# Patient Record
Sex: Female | Born: 1940
Health system: Southern US, Community
[De-identification: ages and names within clinical notes are randomized; demographics above are authoritative.]

## PROBLEM LIST (undated history)

## (undated) DIAGNOSIS — E2839 Other primary ovarian failure: Secondary | ICD-10-CM

## (undated) DIAGNOSIS — R51 Headache: Secondary | ICD-10-CM

## (undated) DIAGNOSIS — G4730 Sleep apnea, unspecified: Secondary | ICD-10-CM

## (undated) DIAGNOSIS — G709 Myoneural disorder, unspecified: Secondary | ICD-10-CM

## (undated) DIAGNOSIS — E785 Hyperlipidemia, unspecified: Secondary | ICD-10-CM

## (undated) DIAGNOSIS — H269 Unspecified cataract: Secondary | ICD-10-CM

## (undated) DIAGNOSIS — Z9289 Personal history of other medical treatment: Secondary | ICD-10-CM

## (undated) DIAGNOSIS — H93A9 Pulsatile tinnitus, unspecified ear: Secondary | ICD-10-CM

## (undated) DIAGNOSIS — T887XXA Unspecified adverse effect of drug or medicament, initial encounter: Secondary | ICD-10-CM

## (undated) DIAGNOSIS — E079 Disorder of thyroid, unspecified: Secondary | ICD-10-CM

## (undated) DIAGNOSIS — K219 Gastro-esophageal reflux disease without esophagitis: Secondary | ICD-10-CM

## (undated) DIAGNOSIS — N762 Acute vulvitis: Secondary | ICD-10-CM

## (undated) DIAGNOSIS — K2970 Gastritis, unspecified, without bleeding: Secondary | ICD-10-CM

## (undated) DIAGNOSIS — B009 Herpesviral infection, unspecified: Secondary | ICD-10-CM

## (undated) DIAGNOSIS — M47816 Spondylosis without myelopathy or radiculopathy, lumbar region: Secondary | ICD-10-CM

## (undated) DIAGNOSIS — F419 Anxiety disorder, unspecified: Secondary | ICD-10-CM

## (undated) DIAGNOSIS — K589 Irritable bowel syndrome without diarrhea: Secondary | ICD-10-CM

## (undated) DIAGNOSIS — B029 Zoster without complications: Secondary | ICD-10-CM

## (undated) DIAGNOSIS — I10 Essential (primary) hypertension: Secondary | ICD-10-CM

## (undated) DIAGNOSIS — A048 Other specified bacterial intestinal infections: Secondary | ICD-10-CM

## (undated) DIAGNOSIS — R739 Hyperglycemia, unspecified: Secondary | ICD-10-CM

## (undated) DIAGNOSIS — D126 Benign neoplasm of colon, unspecified: Secondary | ICD-10-CM

## (undated) HISTORY — DX: Myoneural disorder, unspecified: G70.9

## (undated) HISTORY — DX: Essential (primary) hypertension: I10

## (undated) HISTORY — DX: Other specified bacterial intestinal infections: A04.8

## (undated) HISTORY — PX: THYROIDECTOMY: SHX17

## (undated) HISTORY — DX: Headache: R51

## (undated) HISTORY — DX: Acute vulvitis: N76.2

## (undated) HISTORY — DX: Anxiety disorder, unspecified: F41.9

## (undated) HISTORY — DX: Other primary ovarian failure: E28.39

## (undated) HISTORY — PX: COLONOSCOPY: SHX174

## (undated) HISTORY — PX: POLYPECTOMY: SHX149

## (undated) HISTORY — PX: OTHER SURGICAL HISTORY: SHX169

## (undated) HISTORY — DX: Herpesviral infection, unspecified: B00.9

## (undated) HISTORY — DX: Hyperlipidemia, unspecified: E78.5

## (undated) HISTORY — DX: Personal history of other medical treatment: Z92.89

## (undated) HISTORY — PX: FINGER SURGERY: SHX640

## (undated) HISTORY — DX: Hyperglycemia, unspecified: R73.9

## (undated) HISTORY — PX: TONSILLECTOMY: SUR1361

## (undated) HISTORY — DX: Disorder of thyroid, unspecified: E07.9

## (undated) HISTORY — DX: Gastritis, unspecified, without bleeding: K29.70

## (undated) HISTORY — DX: Spondylosis without myelopathy or radiculopathy, lumbar region: M47.816

## (undated) HISTORY — DX: Irritable bowel syndrome, unspecified: K58.9

## (undated) HISTORY — DX: Unspecified adverse effect of drug or medicament, initial encounter: T88.7XXA

## (undated) HISTORY — DX: Benign neoplasm of colon, unspecified: D12.6

## (undated) HISTORY — DX: Zoster without complications: B02.9

## (undated) HISTORY — DX: Pulsatile tinnitus, unspecified ear: H93.A9

## (undated) HISTORY — DX: Sleep apnea, unspecified: G47.30

## (undated) HISTORY — PX: SHOULDER ARTHROSCOPY: SHX128

## (undated) HISTORY — DX: Unspecified cataract: H26.9

## (undated) HISTORY — DX: Gastro-esophageal reflux disease without esophagitis: K21.9

## (undated) NOTE — *Deleted (*Deleted)
Neurology Consultation Reason for Consult: *** Referring Physician: ***  CC: ***  History is obtained from:***  HPI: [name] [name] is a 28 y.o. female ***   LKW: *** tPA given?: No, or if yes, time given *** IA performed?: No, or if yes, groin puncture time: *** Premorbid modified rankin scale: ***     0 - No symptoms.     1 - No significant disability. Able to carry out all usual activities, despite some symptoms.     2 - Slight disability. Able to look after own affairs without assistance, but unable to carry out all previous activities.     3 - Moderate disability. Requires some help, but able to walk unassisted.     4 - Moderately severe disability. Unable to attend to own bodily needs without assistance, and unable to walk unassisted.     5 - Severe disability. Requires constant nursing care and attention, bedridden, incontinent.     6 - Dead. ICH Score: ***  Time performed: *** GCS: {Blank single:19197::"3-4 is 2 points","5-12 is 1 point","13-15 is 0 points"} Infratentorial: {yes no:314532}. If yes, 1 point Volume: {Blank single:19197::">30cc is 1 point","<30cc is 0 points"}  Age: 84 y.o.. >80 is 1 point Intraventricular extension is 1 point  Score:***  A Score of {Blank single:19197::"0 points has a 30 day mortality of 0%","1 points has a 30 day mortality of 13%","2 points has a 30 day mortality of 26%","3 points has a 30 day mortality of 72%","4 points has a 30 day mortality of 97%","5-6 points has a 30 day mortality of 100%"}. Stroke. 2001 Apr;32(4):891-7.    ROS: A 14 point ROS was performed and is negative except as noted in the HPI. *** Unable to obtain due to altered mental status.   Past Medical History:  Diagnosis Date  . Adenomatous colon polyp   . Anxiety   . Cataract 2013   bilat removed   . DJD (degenerative joint disease) of lumbar spine   . Estrogen deficiency   . Gastritis   . GERD (gastroesophageal reflux disease)   . Headache(784.0)   .  Helicobacter pylori infection 10/13/2007   Qualifier: Diagnosis of  By: Alesia Richards    . History of positive PPD 10/16/2011  . HSV-2 infection   . Hyperglycemia   . Hyperlipidemia    no meds now  . Hypertension   . IBS (irritable bowel syndrome)   . Medication side effect - Propofol (burning vein) and prvastatin (myalgia) 04/08/2013   Pravastatin patient went off and rechallenged and symptoms recurred muscle cramps /foot cramps   . Neuromuscular disorder (HCC)    muscle cramps  . Pulsatile tinnitus    had MRA and MPV nl mild carotid dopplers2010  . Shingles 02/09/2011   Right upper face and eyelid but not involving the eye at this point.   . Sleep apnea    does not use c pap  . Thyroid disease   . Vulvitis    Past Surgical History:  Procedure Laterality Date  . ABDOMINAL HYSTERECTOMY  1982   fibroids  . COLONOSCOPY  2003- 2102   adenomatous polyps  . FINGER SURGERY     left middle A1pulley release  . isthmuectomy     thyroid surgery  . LUMBAR SPINE SURGERY  05/1989   spine d/t tumor neurofibroma Roxan Hockey  . POLYPECTOMY    . SHOULDER ARTHROSCOPY Bilateral    left, right 2017  . THYROIDECTOMY     partial rt  ballen  . TONSILLECTOMY     at age 66.   Marland Kitchen TOTAL HIP ARTHROPLASTY Right 10/02/2017   Procedure: RIGHT TOTAL HIP ARTHROPLASTY ANTERIOR APPROACH;  Surgeon: Samson Frederic, MD;  Location: [facility];  Service: Orthopedics;  Laterality: Right;  Needs RNFA  . TUBAL LIGATION  1974   Current Outpatient Medications  Medication Instructions  . acetaminophen (TYLENOL) 1,000 mg, Oral, Every 6 hours PRN  . amLODipine (NORVASC) 10 mg, Oral, Daily  . famotidine (PEPCID) 20 mg, Oral, 2 times daily  . fluticasone (FLONASE) 50 MCG/ACT nasal spray 2 sprays, Each Nare, As needed, FOR RHINITIS  . hydrochlorothiazide (MICROZIDE) 12.5 mg, Oral, Daily  . melatonin 6 mg, Oral, Daily at bedtime  . QUEtiapine (SEROQUEL) 12.5 mg, Oral, Daily at bedtime, May take an additional 1/2 tablet 2  times daily as needed for agitation  . sennosides-docusate sodium (SENOKOT-S) 8.6-50 MG tablet 1 tablet, Oral, 2 times daily PRN  . SYNTHROID 100 MCG tablet TAKE 1 TABLET BY MOUTH EVERY DAY  . thiamine 100 mg, Oral, Daily  . triamterene-hydrochlorothiazide (MAXZIDE-25) 37.5-25 MG tablet TAKE 1 TABLET BY MOUTH EVERY DAY  . Vitamin B12 1,000 mg, Oral, Daily   Family History  Problem Relation Age of Onset  . Alzheimer's disease Mother   . Heart attack Father        2  . Asthma Sister   . Colon cancer Sister   . Heart attack Brother        65  . Colon polyps Sister   . Colon cancer Sister   . Colon cancer Paternal Aunt   . Thyroid disease Sister        multinodular goiter  . Stomach cancer Neg Hx   . Breast cancer Neg Hx   . Rectal cancer Neg Hx    ***  Social History:  reports that she quit smoking about 36 years ago. Her smoking use included cigarettes. She has a 11.50 pack-year smoking history. She has never used smokeless tobacco. She reports that she does not drink alcohol and does not use drugs. ***  Exam: Current vital signs: BP (!) 122/48 (BP Location: Left Arm)   Pulse 66   Temp 98.8 F (37.1 C) (Oral)   Resp 20   SpO2 98%  Vital signs in last 24 hours: Temp:  [98.2 F (36.8 C)-99.1 F (37.3 C)] 98.8 F (37.1 C) (11/05 1222) Pulse Rate:  [58-79] 66 (11/05 1222) Resp:  [12-23] 20 (11/05 1222) BP: (99-138)/(48-78) 122/48 (11/05 1222) SpO2:  [97 %-100 %] 98 % (11/05 1222)   Physical Exam  Constitutional: Appears well-developed and well-nourished.  Psych: Affect appropriate to situation Eyes: No scleral injection HENT: No OP obstrucion MSK: no joint deformities.  Cardiovascular: Normal rate and regular rhythm.  Respiratory: Effort normal, non-labored breathing GI: Soft.  No distension. There is no tenderness.  Skin: WDI  Neuro: Mental Status: Patient is awake, alert, oriented to person, place, month, year, and situation.*** Patient is able to give a  clear and coherent history.*** No signs of aphasia or neglect*** Cranial Nerves: II: Visual Fields are full. Pupils are equal, round, and reactive to light.  *** III,IV, VI: EOMI without ptosis or diploplia.  V: Facial sensation is symmetric to temperature VII: Facial movement is symmetric.  VIII: hearing is intact to voice X: Uvula elevates symmetrically XI: Shoulder shrug is symmetric. XII: tongue is midline without atrophy or fasciculations.  Motor: Tone is normal. Bulk is normal. 5/5 strength was present in all  four extremities. *** Sensory: Sensation is symmetric to light touch and temperature in the arms and legs.*** Deep Tendon Reflexes: 2+ and symmetric in the biceps and patellae. *** Plantars: Toes are downgoing bilaterally. *** Cerebellar: FNF and HKS are intact bilaterally***      I have reviewed labs in epic and the results pertinent to this consultation are: ***  I have reviewed the images obtained:***   Impression: ***  Recommendations: 1) ***   Brooke Dare MD-PhD [facility] [phone]

## (undated) NOTE — *Deleted (*Deleted)
Transition Care Management Follow-up Telephone Call Date of discharge and from where: *** How have you been since you were released from the hospital? *** Any questions or concerns? {YES/NO TOC TRACKING USE FOR MANAGED MEDICAID REPORTING:24185}  Items Reviewed: Did the pt receive and understand the discharge instructions provided? {YES/NO:21197} Medications obtained and verified? {YES/NO:21197} Other? {YES/NO:21197} Any new allergies since your discharge? {YES/NO:21197} Dietary orders reviewed? {YES/NO TITLE CASE:22902} Do you have support at home? {YES/NO:21197}  Home Care and Equipment/Supplies: Were home health services ordered? {Response; yes/no/na:63} If so, what is the name of the agency? ***  Has the agency set up a time to come to the patient's home? {Response; yes/no/na:63} Were any new equipment or medical supplies ordered?  {Yes/No:304960894} What is the name of the medical supply agency? *** Were you able to get the supplies/equipment? {Response; yes/no/na:63} Do you have any questions related to the use of the equipment or supplies? {Yes/No:304960894}  Functional Questionnaire: (I = Independent and D = Dependent) ADLs: ***  Bathing/Dressing- ***  Meal Prep- ***  Eating- ***  Maintaining continence- ***  Transferring/Ambulation- ***  Managing Meds- ***  Follow up appointments reviewed:  PCP Hospital f/u appt confirmed? {YES/NO:21197} Scheduled to see *** on *** @ ***. Specialist Hospital f/u appt confirmed? {YES/NO:21197} Scheduled to see *** on *** @ ***. Are transportation arrangements needed? {YES/NO:21197} If their condition worsens, is the pt aware to call PCP or go to the Emergency Dept.? {YES/NO TITLE CASE:22902} Was the patient provided with contact information for the PCP's office or ED? {YES/NO TITLE CASE:22902} Was to pt encouraged to call back with questions or concerns? {YES/NO TITLE CASE:22902}  

---

## 1972-08-12 HISTORY — PX: TUBAL LIGATION: SHX77

## 1980-08-12 HISTORY — PX: ABDOMINAL HYSTERECTOMY: SHX81

## 1989-05-12 HISTORY — PX: LUMBAR SPINE SURGERY: SHX701

## 1997-11-10 ENCOUNTER — Ambulatory Visit (HOSPITAL_COMMUNITY): Admission: RE | Admit: 1997-11-10 | Discharge: 1997-11-10 | Payer: Self-pay | Admitting: Interventional Cardiology

## 1998-01-13 ENCOUNTER — Other Ambulatory Visit: Admission: RE | Admit: 1998-01-13 | Discharge: 1998-01-13 | Payer: Self-pay | Admitting: Obstetrics and Gynecology

## 1998-02-02 ENCOUNTER — Ambulatory Visit (HOSPITAL_COMMUNITY): Admission: RE | Admit: 1998-02-02 | Discharge: 1998-02-02 | Payer: Self-pay | Admitting: *Deleted

## 1999-08-16 ENCOUNTER — Ambulatory Visit (HOSPITAL_COMMUNITY): Admission: RE | Admit: 1999-08-16 | Discharge: 1999-08-16 | Payer: Self-pay | Admitting: Internal Medicine

## 1999-09-27 ENCOUNTER — Other Ambulatory Visit: Admission: RE | Admit: 1999-09-27 | Discharge: 1999-09-27 | Payer: Self-pay | Admitting: Endocrinology

## 1999-11-08 ENCOUNTER — Ambulatory Visit (HOSPITAL_COMMUNITY): Admission: RE | Admit: 1999-11-08 | Discharge: 1999-11-09 | Payer: Self-pay | Admitting: General Surgery

## 1999-11-08 ENCOUNTER — Encounter (INDEPENDENT_AMBULATORY_CARE_PROVIDER_SITE_OTHER): Payer: Self-pay | Admitting: *Deleted

## 2000-01-31 ENCOUNTER — Encounter: Admission: RE | Admit: 2000-01-31 | Discharge: 2000-01-31 | Payer: Self-pay | Admitting: Obstetrics and Gynecology

## 2000-01-31 ENCOUNTER — Encounter: Payer: Self-pay | Admitting: Obstetrics and Gynecology

## 2001-02-04 ENCOUNTER — Encounter: Payer: Self-pay | Admitting: Neurological Surgery

## 2001-02-04 ENCOUNTER — Ambulatory Visit (HOSPITAL_COMMUNITY): Admission: RE | Admit: 2001-02-04 | Discharge: 2001-02-04 | Payer: Self-pay | Admitting: Neurological Surgery

## 2001-03-25 ENCOUNTER — Encounter: Admission: RE | Admit: 2001-03-25 | Discharge: 2001-03-25 | Payer: Self-pay | Admitting: Obstetrics and Gynecology

## 2001-03-25 ENCOUNTER — Encounter: Payer: Self-pay | Admitting: Obstetrics and Gynecology

## 2002-01-12 ENCOUNTER — Encounter: Admission: RE | Admit: 2002-01-12 | Discharge: 2002-01-12 | Payer: Self-pay | Admitting: Endocrinology

## 2002-01-12 ENCOUNTER — Encounter: Payer: Self-pay | Admitting: Endocrinology

## 2002-02-25 ENCOUNTER — Encounter: Payer: Self-pay | Admitting: Endocrinology

## 2002-02-25 ENCOUNTER — Encounter: Admission: RE | Admit: 2002-02-25 | Discharge: 2002-02-25 | Payer: Self-pay | Admitting: Endocrinology

## 2002-04-30 ENCOUNTER — Ambulatory Visit (HOSPITAL_COMMUNITY): Admission: RE | Admit: 2002-04-30 | Discharge: 2002-04-30 | Payer: Self-pay | Admitting: Otolaryngology

## 2002-05-27 ENCOUNTER — Encounter: Payer: Self-pay | Admitting: Obstetrics and Gynecology

## 2002-05-27 ENCOUNTER — Encounter: Admission: RE | Admit: 2002-05-27 | Discharge: 2002-05-27 | Payer: Self-pay | Admitting: Obstetrics and Gynecology

## 2002-06-21 ENCOUNTER — Inpatient Hospital Stay (HOSPITAL_COMMUNITY): Admission: EM | Admit: 2002-06-21 | Discharge: 2002-06-23 | Payer: Self-pay | Admitting: Internal Medicine

## 2002-06-21 ENCOUNTER — Encounter: Payer: Self-pay | Admitting: Internal Medicine

## 2002-12-16 ENCOUNTER — Encounter: Payer: Self-pay | Admitting: Neurosurgery

## 2002-12-16 ENCOUNTER — Ambulatory Visit (HOSPITAL_COMMUNITY): Admission: RE | Admit: 2002-12-16 | Discharge: 2002-12-16 | Payer: Self-pay | Admitting: Neurosurgery

## 2003-05-30 ENCOUNTER — Encounter: Admission: RE | Admit: 2003-05-30 | Discharge: 2003-05-30 | Payer: Self-pay | Admitting: Obstetrics and Gynecology

## 2003-05-30 ENCOUNTER — Encounter: Payer: Self-pay | Admitting: Obstetrics and Gynecology

## 2003-07-01 ENCOUNTER — Ambulatory Visit (HOSPITAL_COMMUNITY): Admission: RE | Admit: 2003-07-01 | Discharge: 2003-07-01 | Payer: Self-pay | Admitting: Gastroenterology

## 2004-08-08 ENCOUNTER — Encounter: Admission: RE | Admit: 2004-08-08 | Discharge: 2004-08-08 | Payer: Self-pay | Admitting: Obstetrics and Gynecology

## 2004-08-22 ENCOUNTER — Ambulatory Visit: Payer: Self-pay | Admitting: Gastroenterology

## 2004-08-23 ENCOUNTER — Ambulatory Visit: Payer: Self-pay | Admitting: Gastroenterology

## 2004-09-11 ENCOUNTER — Ambulatory Visit: Payer: Self-pay | Admitting: Endocrinology

## 2004-10-08 ENCOUNTER — Ambulatory Visit: Payer: Self-pay | Admitting: Gastroenterology

## 2004-10-12 ENCOUNTER — Ambulatory Visit: Payer: Self-pay | Admitting: Gastroenterology

## 2005-03-12 ENCOUNTER — Ambulatory Visit: Payer: Self-pay | Admitting: Internal Medicine

## 2005-04-30 ENCOUNTER — Ambulatory Visit: Payer: Self-pay | Admitting: Internal Medicine

## 2005-05-03 ENCOUNTER — Ambulatory Visit: Payer: Self-pay | Admitting: Cardiology

## 2005-07-16 ENCOUNTER — Ambulatory Visit: Payer: Self-pay | Admitting: Internal Medicine

## 2005-09-26 ENCOUNTER — Encounter: Admission: RE | Admit: 2005-09-26 | Discharge: 2005-09-26 | Payer: Self-pay | Admitting: Obstetrics and Gynecology

## 2005-11-04 ENCOUNTER — Ambulatory Visit: Payer: Self-pay | Admitting: Internal Medicine

## 2005-11-11 ENCOUNTER — Ambulatory Visit: Payer: Self-pay | Admitting: Internal Medicine

## 2005-12-11 ENCOUNTER — Ambulatory Visit: Payer: Self-pay | Admitting: Gastroenterology

## 2006-03-10 ENCOUNTER — Ambulatory Visit: Payer: Self-pay | Admitting: Internal Medicine

## 2006-08-29 ENCOUNTER — Ambulatory Visit: Payer: Self-pay | Admitting: Gastroenterology

## 2006-09-09 ENCOUNTER — Ambulatory Visit: Payer: Self-pay | Admitting: Cardiology

## 2006-10-20 ENCOUNTER — Encounter: Admission: RE | Admit: 2006-10-20 | Discharge: 2006-10-20 | Payer: Self-pay | Admitting: Obstetrics and Gynecology

## 2006-10-29 ENCOUNTER — Ambulatory Visit (HOSPITAL_COMMUNITY): Admission: RE | Admit: 2006-10-29 | Discharge: 2006-10-29 | Payer: Self-pay | Admitting: Neurosurgery

## 2006-12-26 ENCOUNTER — Ambulatory Visit: Payer: Self-pay | Admitting: Gastroenterology

## 2007-01-19 ENCOUNTER — Ambulatory Visit: Payer: Self-pay | Admitting: Internal Medicine

## 2007-01-19 LAB — CONVERTED CEMR LAB
ALT: 17 units/L (ref 0–40)
AST: 25 units/L (ref 0–37)
Albumin: 4.1 g/dL (ref 3.5–5.2)
Alkaline Phosphatase: 47 units/L (ref 39–117)
BUN: 18 mg/dL (ref 6–23)
Basophils Absolute: 0 10*3/uL (ref 0.0–0.1)
Basophils Relative: 0.1 % (ref 0.0–1.0)
Bilirubin, Direct: 0.1 mg/dL (ref 0.0–0.3)
CO2: 29 meq/L (ref 19–32)
Calcium: 9.4 mg/dL (ref 8.4–10.5)
Chloride: 108 meq/L (ref 96–112)
Cholesterol: 226 mg/dL (ref 0–200)
Creatinine, Ser: 0.8 mg/dL (ref 0.4–1.2)
Direct LDL: 135.3 mg/dL
Eosinophils Absolute: 0.1 10*3/uL (ref 0.0–0.6)
Eosinophils Relative: 2.3 % (ref 0.0–5.0)
GFR calc Af Amer: 93 mL/min
GFR calc non Af Amer: 77 mL/min
Glucose, Bld: 94 mg/dL (ref 70–99)
HCT: 39.1 % (ref 36.0–46.0)
HDL: 47.9 mg/dL (ref >39.0)
Hemoglobin: 13.2 g/dL (ref 12.0–15.0)
Hgb A1c MFr Bld: 6.3 % — ABNORMAL HIGH (ref 4.6–6.0)
Lymphocytes Relative: 41.6 % (ref 12.0–46.0)
MCHC: 33.6 g/dL (ref 30.0–36.0)
MCV: 87.5 fL (ref 78.0–100.0)
Monocytes Absolute: 0.4 10*3/uL (ref 0.2–0.7)
Monocytes Relative: 8.7 % (ref 3.0–11.0)
Neutro Abs: 2.4 10*3/uL (ref 1.4–7.7)
Neutrophils Relative %: 47.3 % (ref 43.0–77.0)
Platelets: 216 10*3/uL (ref 150–400)
Potassium: 3.8 meq/L (ref 3.5–5.1)
RBC: 4.47 M/uL (ref 3.87–5.11)
RDW: 13.1 % (ref 11.5–14.6)
Sodium: 144 meq/L (ref 135–145)
TSH: 0.72 microintl units/mL (ref 0.35–5.50)
Total Bilirubin: 0.7 mg/dL (ref 0.3–1.2)
Total CHOL/HDL Ratio: 4.7
Total Protein: 7.5 g/dL (ref 6.0–8.3)
Triglycerides: 186 mg/dL — ABNORMAL HIGH (ref 0–149)
VLDL: 37 mg/dL (ref 0–40)
WBC: 5 10*3/uL (ref 4.5–10.5)

## 2007-01-26 ENCOUNTER — Encounter: Admission: RE | Admit: 2007-01-26 | Discharge: 2007-01-26 | Payer: Self-pay | Admitting: Internal Medicine

## 2007-02-12 ENCOUNTER — Ambulatory Visit: Payer: Self-pay | Admitting: Family Medicine

## 2007-03-03 ENCOUNTER — Encounter: Admission: RE | Admit: 2007-03-03 | Discharge: 2007-03-03 | Payer: Self-pay | Admitting: Family Medicine

## 2007-03-03 ENCOUNTER — Other Ambulatory Visit: Admission: RE | Admit: 2007-03-03 | Discharge: 2007-03-03 | Payer: Self-pay | Admitting: Interventional Radiology

## 2007-03-03 ENCOUNTER — Encounter (INDEPENDENT_AMBULATORY_CARE_PROVIDER_SITE_OTHER): Payer: Self-pay | Admitting: Interventional Radiology

## 2007-03-25 ENCOUNTER — Ambulatory Visit: Payer: Self-pay | Admitting: Gastroenterology

## 2007-04-07 DIAGNOSIS — K219 Gastro-esophageal reflux disease without esophagitis: Secondary | ICD-10-CM | POA: Insufficient documentation

## 2007-04-07 DIAGNOSIS — I10 Essential (primary) hypertension: Secondary | ICD-10-CM | POA: Insufficient documentation

## 2007-04-07 DIAGNOSIS — E785 Hyperlipidemia, unspecified: Secondary | ICD-10-CM | POA: Insufficient documentation

## 2007-04-14 ENCOUNTER — Inpatient Hospital Stay (HOSPITAL_COMMUNITY): Admission: EM | Admit: 2007-04-14 | Discharge: 2007-04-15 | Payer: Self-pay | Admitting: Emergency Medicine

## 2007-04-15 ENCOUNTER — Encounter: Payer: Self-pay | Admitting: Internal Medicine

## 2007-04-16 ENCOUNTER — Ambulatory Visit: Payer: Self-pay | Admitting: Internal Medicine

## 2007-05-27 ENCOUNTER — Ambulatory Visit: Payer: Self-pay | Admitting: Internal Medicine

## 2007-07-02 ENCOUNTER — Ambulatory Visit: Payer: Self-pay | Admitting: Internal Medicine

## 2007-07-16 ENCOUNTER — Encounter: Payer: Self-pay | Admitting: Internal Medicine

## 2007-07-16 ENCOUNTER — Ambulatory Visit: Payer: Self-pay | Admitting: Internal Medicine

## 2007-08-24 ENCOUNTER — Ambulatory Visit: Payer: Self-pay | Admitting: Internal Medicine

## 2007-09-04 ENCOUNTER — Telehealth: Payer: Self-pay | Admitting: Internal Medicine

## 2007-09-04 DIAGNOSIS — R7309 Other abnormal glucose: Secondary | ICD-10-CM | POA: Insufficient documentation

## 2007-09-23 ENCOUNTER — Ambulatory Visit: Payer: Self-pay | Admitting: Internal Medicine

## 2007-09-23 DIAGNOSIS — R05 Cough: Secondary | ICD-10-CM | POA: Insufficient documentation

## 2007-09-23 DIAGNOSIS — J019 Acute sinusitis, unspecified: Secondary | ICD-10-CM | POA: Insufficient documentation

## 2007-09-23 DIAGNOSIS — R059 Cough, unspecified: Secondary | ICD-10-CM | POA: Insufficient documentation

## 2007-09-23 DIAGNOSIS — J309 Allergic rhinitis, unspecified: Secondary | ICD-10-CM | POA: Insufficient documentation

## 2007-10-13 DIAGNOSIS — E663 Overweight: Secondary | ICD-10-CM | POA: Insufficient documentation

## 2007-10-13 DIAGNOSIS — A048 Other specified bacterial intestinal infections: Secondary | ICD-10-CM | POA: Insufficient documentation

## 2007-10-13 DIAGNOSIS — D649 Anemia, unspecified: Secondary | ICD-10-CM | POA: Insufficient documentation

## 2007-10-13 DIAGNOSIS — K209 Esophagitis, unspecified without bleeding: Secondary | ICD-10-CM | POA: Insufficient documentation

## 2007-10-13 DIAGNOSIS — D126 Benign neoplasm of colon, unspecified: Secondary | ICD-10-CM | POA: Insufficient documentation

## 2007-10-13 DIAGNOSIS — L980 Pyogenic granuloma: Secondary | ICD-10-CM | POA: Insufficient documentation

## 2007-10-13 DIAGNOSIS — K2940 Chronic atrophic gastritis without bleeding: Secondary | ICD-10-CM | POA: Insufficient documentation

## 2007-10-13 DIAGNOSIS — K2980 Duodenitis without bleeding: Secondary | ICD-10-CM | POA: Insufficient documentation

## 2007-10-13 DIAGNOSIS — K449 Diaphragmatic hernia without obstruction or gangrene: Secondary | ICD-10-CM | POA: Insufficient documentation

## 2007-10-13 DIAGNOSIS — K589 Irritable bowel syndrome without diarrhea: Secondary | ICD-10-CM | POA: Insufficient documentation

## 2007-10-13 HISTORY — DX: Other specified bacterial intestinal infections: A04.8

## 2007-11-19 ENCOUNTER — Encounter: Admission: RE | Admit: 2007-11-19 | Discharge: 2007-11-19 | Payer: Self-pay | Admitting: Internal Medicine

## 2007-12-01 ENCOUNTER — Encounter: Admission: RE | Admit: 2007-12-01 | Discharge: 2007-12-01 | Payer: Self-pay | Admitting: Internal Medicine

## 2008-02-05 ENCOUNTER — Ambulatory Visit: Payer: Self-pay | Admitting: Internal Medicine

## 2008-02-05 DIAGNOSIS — E559 Vitamin D deficiency, unspecified: Secondary | ICD-10-CM | POA: Insufficient documentation

## 2008-02-05 LAB — CONVERTED CEMR LAB
ALT: 19 units/L (ref 0–35)
AST: 28 units/L (ref 0–37)
Albumin: 4.2 g/dL (ref 3.5–5.2)
Alkaline Phosphatase: 52 units/L (ref 39–117)
BUN: 18 mg/dL (ref 6–23)
Basophils Absolute: 0 10*3/uL (ref 0.0–0.1)
Basophils Relative: 0.8 % (ref 0.0–1.0)
Bilirubin, Direct: 0.1 mg/dL (ref 0.0–0.3)
CO2: 28 meq/L (ref 19–32)
Calcium: 9.7 mg/dL (ref 8.4–10.5)
Chloride: 101 meq/L (ref 96–112)
Cholesterol: 140 mg/dL (ref 0–200)
Creatinine, Ser: 0.8 mg/dL (ref 0.4–1.2)
Eosinophils Absolute: 0.1 10*3/uL (ref 0.0–0.7)
Eosinophils Relative: 2.6 % (ref 0.0–5.0)
GFR calc Af Amer: 92 mL/min
GFR calc non Af Amer: 76 mL/min
Glucose, Bld: 91 mg/dL (ref 70–99)
HCT: 41 % (ref 36.0–46.0)
HDL: 47.1 mg/dL (ref >39.0)
Hemoglobin: 13.5 g/dL (ref 12.0–15.0)
Hgb A1c MFr Bld: 6.3 % — ABNORMAL HIGH (ref 4.6–6.0)
LDL Cholesterol: 63 mg/dL (ref 0–99)
Lymphocytes Relative: 43.5 % (ref 12.0–46.0)
MCHC: 32.9 g/dL (ref 30.0–36.0)
MCV: 88.3 fL (ref 78.0–100.0)
Monocytes Absolute: 0.4 10*3/uL (ref 0.1–1.0)
Monocytes Relative: 8.3 % (ref 3.0–12.0)
Neutro Abs: 2.3 10*3/uL (ref 1.4–7.7)
Neutrophils Relative %: 44.8 % (ref 43.0–77.0)
Platelets: 201 10*3/uL (ref 150–400)
Potassium: 3.9 meq/L (ref 3.5–5.1)
RBC: 4.65 M/uL (ref 3.87–5.11)
RDW: 13.4 % (ref 11.5–14.6)
Sodium: 139 meq/L (ref 135–145)
TSH: 0.65 microintl units/mL (ref 0.35–5.50)
Total Bilirubin: 0.6 mg/dL (ref 0.3–1.2)
Total CHOL/HDL Ratio: 3
Total Protein: 7.6 g/dL (ref 6.0–8.3)
Triglycerides: 149 mg/dL (ref 0–149)
VLDL: 30 mg/dL (ref 0–40)
Vit D, 1,25-Dihydroxy: 39 (ref 30–89)
WBC: 5.2 10*3/uL (ref 4.5–10.5)

## 2008-02-07 DIAGNOSIS — R519 Headache, unspecified: Secondary | ICD-10-CM | POA: Insufficient documentation

## 2008-02-07 DIAGNOSIS — R51 Headache: Secondary | ICD-10-CM | POA: Insufficient documentation

## 2008-02-18 ENCOUNTER — Telehealth (INDEPENDENT_AMBULATORY_CARE_PROVIDER_SITE_OTHER): Payer: Self-pay | Admitting: *Deleted

## 2008-03-08 ENCOUNTER — Encounter: Payer: Self-pay | Admitting: Internal Medicine

## 2008-03-17 ENCOUNTER — Telehealth: Payer: Self-pay | Admitting: Internal Medicine

## 2008-03-30 ENCOUNTER — Encounter: Payer: Self-pay | Admitting: Internal Medicine

## 2008-05-10 ENCOUNTER — Telehealth: Payer: Self-pay | Admitting: Internal Medicine

## 2008-05-11 DIAGNOSIS — H9319 Tinnitus, unspecified ear: Secondary | ICD-10-CM | POA: Insufficient documentation

## 2008-05-17 ENCOUNTER — Ambulatory Visit: Payer: Self-pay | Admitting: Internal Medicine

## 2008-05-24 ENCOUNTER — Ambulatory Visit: Payer: Self-pay

## 2008-05-24 ENCOUNTER — Encounter: Payer: Self-pay | Admitting: Internal Medicine

## 2008-09-23 ENCOUNTER — Ambulatory Visit: Payer: Self-pay | Admitting: Internal Medicine

## 2008-09-23 DIAGNOSIS — R300 Dysuria: Secondary | ICD-10-CM | POA: Insufficient documentation

## 2008-09-23 LAB — CONVERTED CEMR LAB
Bilirubin Urine: NEGATIVE
Blood in Urine, dipstick: NEGATIVE
Glucose, Urine, Semiquant: NEGATIVE
Ketones, urine, test strip: NEGATIVE
Nitrite: POSITIVE
Specific Gravity, Urine: 1.015
Urobilinogen, UA: 1
pH: 7

## 2008-09-24 ENCOUNTER — Encounter: Payer: Self-pay | Admitting: Internal Medicine

## 2008-12-07 ENCOUNTER — Encounter: Payer: Self-pay | Admitting: Internal Medicine

## 2008-12-20 ENCOUNTER — Encounter: Admission: RE | Admit: 2008-12-20 | Discharge: 2008-12-20 | Payer: Self-pay | Admitting: Obstetrics and Gynecology

## 2009-02-28 ENCOUNTER — Ambulatory Visit: Payer: Self-pay | Admitting: Internal Medicine

## 2009-02-28 DIAGNOSIS — M79609 Pain in unspecified limb: Secondary | ICD-10-CM | POA: Insufficient documentation

## 2009-03-03 LAB — CONVERTED CEMR LAB
ALT: 18 units/L (ref 0–35)
AST: 30 units/L (ref 0–37)
Albumin: 3.8 g/dL (ref 3.5–5.2)
Alkaline Phosphatase: 39 units/L (ref 39–117)
BUN: 20 mg/dL (ref 6–23)
Basophils Absolute: 0 10*3/uL (ref 0.0–0.1)
Basophils Relative: 0.2 % (ref 0.0–3.0)
Bilirubin, Direct: 0 mg/dL (ref 0.0–0.3)
CO2: 30 meq/L (ref 19–32)
Calcium: 9.6 mg/dL (ref 8.4–10.5)
Chloride: 107 meq/L (ref 96–112)
Cholesterol: 134 mg/dL (ref 0–200)
Creatinine, Ser: 0.8 mg/dL (ref 0.4–1.2)
Direct LDL: 64.2 mg/dL
Eosinophils Absolute: 0.2 10*3/uL (ref 0.0–0.7)
Eosinophils Relative: 2.7 % (ref 0.0–5.0)
Folate: 14.3 ng/mL
GFR calc non Af Amer: 91.97 mL/min (ref >60)
Glucose, Bld: 88 mg/dL (ref 70–99)
HCT: 37.1 % (ref 36.0–46.0)
HDL: 45.7 mg/dL (ref >39.00)
Hemoglobin: 12.5 g/dL (ref 12.0–15.0)
Hgb A1c MFr Bld: 6 % (ref 4.6–6.5)
Lymphocytes Relative: 43.6 % (ref 12.0–46.0)
Lymphs Abs: 2.9 10*3/uL (ref 0.7–4.0)
MCHC: 33.7 g/dL (ref 30.0–36.0)
MCV: 88 fL (ref 78.0–100.0)
Monocytes Absolute: 0.6 10*3/uL (ref 0.1–1.0)
Monocytes Relative: 9.1 % (ref 3.0–12.0)
Neutro Abs: 3 10*3/uL (ref 1.4–7.7)
Neutrophils Relative %: 44.4 % (ref 43.0–77.0)
Platelets: 172 10*3/uL (ref 150.0–400.0)
Potassium: 3.7 meq/L (ref 3.5–5.1)
RBC: 4.22 M/uL (ref 3.87–5.11)
RDW: 13.1 % (ref 11.5–14.6)
Sodium: 144 meq/L (ref 135–145)
TSH: 0.55 microintl units/mL (ref 0.35–5.50)
Total Bilirubin: 0.6 mg/dL (ref 0.3–1.2)
Total CHOL/HDL Ratio: 3
Total Protein: 6.8 g/dL (ref 6.0–8.3)
Triglycerides: 252 mg/dL — ABNORMAL HIGH (ref 0.0–149.0)
VLDL: 50.4 mg/dL — ABNORMAL HIGH (ref 0.0–40.0)
Vitamin B-12: 899 pg/mL (ref 211–911)
WBC: 6.7 10*3/uL (ref 4.5–10.5)

## 2009-03-23 ENCOUNTER — Encounter: Admission: RE | Admit: 2009-03-23 | Discharge: 2009-03-23 | Payer: Self-pay | Admitting: Neurosurgery

## 2009-04-12 ENCOUNTER — Encounter: Payer: Self-pay | Admitting: Internal Medicine

## 2009-06-01 ENCOUNTER — Encounter: Payer: Self-pay | Admitting: Internal Medicine

## 2009-06-01 ENCOUNTER — Ambulatory Visit: Payer: Self-pay

## 2009-06-16 ENCOUNTER — Telehealth: Payer: Self-pay | Admitting: *Deleted

## 2009-07-18 ENCOUNTER — Telehealth: Payer: Self-pay | Admitting: *Deleted

## 2009-08-09 ENCOUNTER — Ambulatory Visit: Payer: Self-pay | Admitting: Internal Medicine

## 2009-08-09 LAB — CONVERTED CEMR LAB
ALT: 17 units/L (ref 0–35)
AST: 27 units/L (ref 0–37)
Albumin: 3.7 g/dL (ref 3.5–5.2)
Alkaline Phosphatase: 36 units/L — ABNORMAL LOW (ref 39–117)
Bilirubin, Direct: 0 mg/dL (ref 0.0–0.3)
Cholesterol: 211 mg/dL — ABNORMAL HIGH (ref 0–200)
Direct LDL: 131.1 mg/dL
HDL: 52.6 mg/dL (ref >39.00)
Hgb A1c MFr Bld: 6.1 % (ref 4.6–6.5)
Total Bilirubin: 0.7 mg/dL (ref 0.3–1.2)
Total CHOL/HDL Ratio: 4
Total Protein: 7.1 g/dL (ref 6.0–8.3)
Triglycerides: 159 mg/dL — ABNORMAL HIGH (ref 0.0–149.0)
VLDL: 31.8 mg/dL (ref 0.0–40.0)

## 2009-08-15 ENCOUNTER — Ambulatory Visit: Payer: Self-pay | Admitting: Internal Medicine

## 2009-09-15 ENCOUNTER — Telehealth: Payer: Self-pay | Admitting: Internal Medicine

## 2009-11-14 ENCOUNTER — Ambulatory Visit: Payer: Self-pay | Admitting: Internal Medicine

## 2009-11-14 LAB — CONVERTED CEMR LAB
Cholesterol: 161 mg/dL (ref 0–200)
HDL: 56 mg/dL (ref >39.00)
LDL Cholesterol: 88 mg/dL (ref 0–99)
Total CHOL/HDL Ratio: 3
Triglycerides: 86 mg/dL (ref 0.0–149.0)
VLDL: 17.2 mg/dL (ref 0.0–40.0)

## 2009-11-21 ENCOUNTER — Ambulatory Visit: Payer: Self-pay | Admitting: Internal Medicine

## 2009-11-21 DIAGNOSIS — G479 Sleep disorder, unspecified: Secondary | ICD-10-CM | POA: Insufficient documentation

## 2009-12-21 ENCOUNTER — Encounter: Admission: RE | Admit: 2009-12-21 | Discharge: 2009-12-21 | Payer: Self-pay | Admitting: Internal Medicine

## 2010-01-09 ENCOUNTER — Telehealth: Payer: Self-pay | Admitting: *Deleted

## 2010-01-11 ENCOUNTER — Telehealth: Payer: Self-pay | Admitting: *Deleted

## 2010-04-04 ENCOUNTER — Ambulatory Visit: Payer: Self-pay | Admitting: Internal Medicine

## 2010-04-04 DIAGNOSIS — M25569 Pain in unspecified knee: Secondary | ICD-10-CM | POA: Insufficient documentation

## 2010-04-06 LAB — CONVERTED CEMR LAB
ALT: 15 units/L (ref 0–35)
AST: 23 units/L (ref 0–37)
Albumin: 3.8 g/dL (ref 3.5–5.2)
Alkaline Phosphatase: 42 units/L (ref 39–117)
BUN: 20 mg/dL (ref 6–23)
Basophils Absolute: 0 10*3/uL (ref 0.0–0.1)
Basophils Relative: 0.4 % (ref 0.0–3.0)
Bilirubin, Direct: 0.1 mg/dL (ref 0.0–0.3)
CO2: 29 meq/L (ref 19–32)
Calcium: 9.3 mg/dL (ref 8.4–10.5)
Chloride: 108 meq/L (ref 96–112)
Cholesterol: 170 mg/dL (ref 0–200)
Creatinine, Ser: 0.8 mg/dL (ref 0.4–1.2)
Eosinophils Absolute: 0.1 10*3/uL (ref 0.0–0.7)
Eosinophils Relative: 2.4 % (ref 0.0–5.0)
GFR calc non Af Amer: 98.76 mL/min (ref >60)
Glucose, Bld: 87 mg/dL (ref 70–99)
HCT: 36.5 % (ref 36.0–46.0)
HDL: 48.7 mg/dL (ref >39.00)
Hemoglobin: 12.1 g/dL (ref 12.0–15.0)
Hgb A1c MFr Bld: 6.2 % (ref 4.6–6.5)
LDL Cholesterol: 101 mg/dL — ABNORMAL HIGH (ref 0–99)
Lymphocytes Relative: 44.7 % (ref 12.0–46.0)
Lymphs Abs: 2.4 10*3/uL (ref 0.7–4.0)
MCHC: 33.2 g/dL (ref 30.0–36.0)
MCV: 89.5 fL (ref 78.0–100.0)
Monocytes Absolute: 0.4 10*3/uL (ref 0.1–1.0)
Monocytes Relative: 7 % (ref 3.0–12.0)
Neutro Abs: 2.4 10*3/uL (ref 1.4–7.7)
Neutrophils Relative %: 45.5 % (ref 43.0–77.0)
Platelets: 164 10*3/uL (ref 150.0–400.0)
Potassium: 4.1 meq/L (ref 3.5–5.1)
RBC: 4.08 M/uL (ref 3.87–5.11)
RDW: 14.3 % (ref 11.5–14.6)
Sodium: 144 meq/L (ref 135–145)
TSH: 0.79 microintl units/mL (ref 0.35–5.50)
Total Bilirubin: 0.6 mg/dL (ref 0.3–1.2)
Total CHOL/HDL Ratio: 3
Total Protein: 6.5 g/dL (ref 6.0–8.3)
Triglycerides: 104 mg/dL (ref 0.0–149.0)
VLDL: 20.8 mg/dL (ref 0.0–40.0)
WBC: 5.3 10*3/uL (ref 4.5–10.5)

## 2010-06-07 ENCOUNTER — Encounter: Payer: Self-pay | Admitting: Internal Medicine

## 2010-06-08 ENCOUNTER — Encounter: Payer: Self-pay | Admitting: Internal Medicine

## 2010-06-08 ENCOUNTER — Ambulatory Visit: Payer: Self-pay

## 2010-06-08 ENCOUNTER — Telehealth: Payer: Self-pay | Admitting: *Deleted

## 2010-06-11 ENCOUNTER — Ambulatory Visit: Payer: Self-pay | Admitting: Internal Medicine

## 2010-07-24 ENCOUNTER — Ambulatory Visit: Payer: Self-pay | Admitting: Internal Medicine

## 2010-07-24 ENCOUNTER — Encounter: Payer: Self-pay | Admitting: Internal Medicine

## 2010-07-24 DIAGNOSIS — F4322 Adjustment disorder with anxiety: Secondary | ICD-10-CM | POA: Insufficient documentation

## 2010-07-24 DIAGNOSIS — R071 Chest pain on breathing: Secondary | ICD-10-CM | POA: Insufficient documentation

## 2010-07-30 LAB — CONVERTED CEMR LAB
BUN: 24 mg/dL — ABNORMAL HIGH
CO2: 28 meq/L
Calcium: 9.8 mg/dL
Chloride: 102 meq/L
Cholesterol: 192 mg/dL
Creatinine, Ser: 0.8 mg/dL
GFR calc non Af Amer: 98.67 mL/min
Glucose, Bld: 80 mg/dL
HDL: 58 mg/dL
Hgb A1c MFr Bld: 6.1 %
LDL Cholesterol: 108 mg/dL — ABNORMAL HIGH
Potassium: 3.8 meq/L
Sodium: 143 meq/L
TSH: 0.64 u[IU]/mL
Total CHOL/HDL Ratio: 3
Triglycerides: 129 mg/dL
VLDL: 25.8 mg/dL

## 2010-09-02 ENCOUNTER — Encounter: Payer: Self-pay | Admitting: Internal Medicine

## 2010-09-13 NOTE — Progress Notes (Signed)
Summary: refill on simvastatin  Phone Note From Pharmacy   [facility] [phone]* Reason for Call: Needs renewal Details for Reason: simvastatin 40mg  Initial call taken by: Romualdo Bolk, CMA Duncan Dull),  June 08, 2010 3:35 PM  Follow-up for Phone Call        Rx sent to pharmacy. Follow-up by: Romualdo Bolk, CMA Duncan Dull),  June 08, 2010 3:36 PM    Prescriptions: SIMVASTATIN 40 MG TABS (SIMVASTATIN) 1 by mouth once daily  #90 x 0   Entered by:   Romualdo Bolk, CMA (AAMA)   Authorized by:   Madelin Headings MD   Signed by:   Romualdo Bolk, CMA (AAMA) on 06/08/2010   Method used:   [facility]        [facility]. [phone]* (retail)       [address]       [place], [place]  [address]       Ph: [phone] or [phone]       Fax: [phone]   RxID:   [id number]

## 2010-09-13 NOTE — Assessment & Plan Note (Signed)
Summary: 3 month rov/njr   Vital Signs:  Patient profile:   70 year old female Menstrual status:  hysterectomy Weight:      204 pounds Pulse rate:   78 / minute BP sitting:   100 / 76  (right arm) Cuff size:   large  Vitals Entered By: Romualdo Bolk, CMA (AAMA) (November 21, 2009 10:29 AM) CC: Follow-up visit on labs, Hypertension Management, not sleeping at night   History of Present Illness: [name] [name] comesin for follow up of a number of medical issues  LPIDs : change the way she is eating for [place].  WJ:XBJYN well  controlled and nl.   also checked sugars and 110.   No taking gabapentin  for legs recently.  per Dr Venetia Maxon Dr Amanda Pea gave her iburpofen ocass for pain not taking   Sleep.   wakenings.   every few hours .  tried   tea andother  otx no help. seems to have a hard time shutting off thoughts but doing better in the day. Has ? about root such as valerian that dr Neil Crouch discussed    Hypertension History:      She complains of headache, but denies chest pain, palpitations, dyspnea with exertion, orthopnea, PND, peripheral edema, visual symptoms, neurologic problems, syncope, and side effects from treatment.  She notes no problems with any antihypertensive medication side effects.  ha's due to lack of sleep.        Positive major cardiovascular risk factors include female age 40 years old or older, hyperlipidemia, and hypertension.  Negative major cardiovascular risk factors include non-tobacco-user status.     Preventive Screening-Counseling & Management  Alcohol-Tobacco     Alcohol drinks/day: 0     Smoking Status: quit     Year Quit: 06/16/1984  Caffeine-Diet-Exercise     Caffeine use/day: 1-2     Does Patient Exercise: no  Current Medications (verified): 1)  Synthroid 100 Mcg  Tabs (Levothyroxine Sodium) .Marland Kitchen.. 1 By Mouth Once Daily 2)  Simvastatin 40 Mg Tabs (Simvastatin) .Marland Kitchen.. 1 By Mouth Once Daily 3)  Triamterene-Hctz 37.5-25 Mg Tabs (Triamterene-Hctz) .Marland Kitchen.. 1 By  Mouth Once Daily 4)  Flonase 50 Mcg/act Susp (Fluticasone Propionate) .... 2 Sprays Each Nares Q D For Rhinitis 5)  Ativan 0.5 Mg Tabs (Lorazepam) .Marland Kitchen.. 1-2 Two Times A Day 6)  Aspirin 81 Mg  Tabs (Aspirin) 7)  Multivitamins   Tabs (Multiple Vitamin) 8)  Vitamin D 1000 Unit  Tabs (Cholecalciferol) 9)  Gabapentin 100 Mg Caps (Gabapentin) .Marland Kitchen.. 1 By Mouth Once Daily As Needed  Allergies (verified): 1)  Codeine Phosphate (Codeine Phosphate) 2)  Oxycodone Hcl (Oxycodone Hcl) 3)  Zestril (Lisinopril)  Past History:  Past medical, surgical, family and social histories (including risk factors) reviewed, and no changes noted (except as noted below).  Past Medical History: Reviewed history from 02/28/2009 and no changes required. GERD Hyperlipidemia Hypertension Cancerous polyps, hx of Thyroid disease  evaluation for pulsatile tinnitus   MRA MPV normal  hyperglycemia  Abd pain eval with neg ct [place] and mri of back  djd Stern Headache ENT  eval for cough and throat     Consults Dr. Stasia Cavalier   Past Surgical History: Reviewed history from 02/05/2008 and no changes required. Hysterectomy fibroids Lumbar sx   Roxan Hockey   neuro fibroma Left shoulder arthroscopy Left middle finger A1 pulley release Thyroidectomy-partial right Ballen Isthmusectomy Tonsillectomy Colon polypectomy  Past History:  Care Management: Neurology: Venetia Maxon Orthopedics: [facility]  Family History: Reviewed history  from 02/05/2008 and no changes required. no change in fam hx  Family History of CAD Female 1st degree relative  father MI Family History Hypertension mom allergies  Social History: Reviewed history from 08/15/2009 and no changes required. Former Smoker Retired Alcohol use-no Drug use-no Regular exercise-no walks when she can husband dx with Multiple myeloma  . widowed this fall 2010 hh of 1  Review of Systems  The patient denies anorexia, fever, weight loss, weight gain, vision loss,  difficulty walking, abnormal bleeding, enlarged lymph nodes, and angioedema.    Physical Exam  General:  alert, well-developed, well-nourished, and well-hydrated.   Head:  normocephalic and atraumatic.   Eyes:  vision grossly intact.   Neck:  No deformities, masses, or tenderness noted. Lungs:  Normal respiratory effort, chest expands symmetrically. Lungs are clear to auscultation, no crackles or wheezes. Heart:  Normal rate and regular rhythm. S1 and S2 normal without gallop, murmur, click, rub or other extra sounds. Abdomen:  Bowel sounds positive,abdomen soft and non-tender without masses, organomegaly or  noted. Extremities:  no clubbing cyanosis or edema  Neurologic:  non focal  Skin:  turgor normal, color normal, no ecchymoses, and no petechiae.   Cervical Nodes:  No lymphadenopathy noted Psych:  Oriented X3, good eye contact, not anxious appearing, and not depressed appearing.     Impression & Recommendations:  Problem # 1:  HYPERLIPIDEMIA (ICD-272.4) Assessment Improved  Her updated medication list for this problem includes:    Simvastatin 40 Mg Tabs (Simvastatin) .Marland Kitchen... 1 by mouth once daily  Labs Reviewed: SGOT: 27 (08/09/2009)   SGPT: 17 (08/09/2009)  10 Yr Risk Heart Disease: 4 % Prior 10 Yr Risk Heart Disease: 7 % (08/15/2009)   HDL:56.00 (11/14/2009), 52.60 (08/09/2009)  LDL:88 (11/14/2009), 63 (02/05/2008)  Chol:161 (11/14/2009), 211 (08/09/2009)  Trig:86.0 (11/14/2009), 159.0 (08/09/2009)  Problem # 2:  SLEEP DISORDER/DISTURBANCE (ICD-780.50) Assessment: New interrupted   and  napping.    prob situational.    and related to  bereavement  . disc options  sleep aids vs antianxiety meds   . sleep hygiene to continue.   Problem # 3:  HYPERTENSION (ICD-401.9)  Her updated medication list for this problem includes:    Triamterene-hctz 37.5-25 Mg Tabs (Triamterene-hctz) .Marland Kitchen... 1 by mouth once daily  Problem # 4:  BEREAVEMENT UNCOMPLICATED NEC (ICD-V62.82)  Complete  Medication List: 1)  Synthroid 100 Mcg Tabs (Levothyroxine sodium) .Marland Kitchen.. 1 by mouth once daily 2)  Simvastatin 40 Mg Tabs (Simvastatin) .Marland Kitchen.. 1 by mouth once daily 3)  Triamterene-hctz 37.5-25 Mg Tabs (Triamterene-hctz) .Marland Kitchen.. 1 by mouth once daily 4)  Flonase 50 Mcg/act Susp (Fluticasone propionate) .... 2 sprays each nares q d for rhinitis 5)  Ativan 0.5 Mg Tabs (Lorazepam) .Marland Kitchen.. 1-2 two times a  day as needed 6)  Aspirin 81 Mg Tabs (Aspirin) 7)  Multivitamins Tabs (Multiple vitamin) 8)  Vitamin D 1000 Unit Tabs (Cholecalciferol) 9)  Gabapentin 100 Mg Caps (Gabapentin) .Marland Kitchen.. 1 by mouth once daily as needed  Hypertension Assessment/Plan:      The patient's hypertensive risk group is category B: At least one risk factor (excluding diabetes) with no target organ damage.  Her calculated 10 year risk of coronary heart disease is 4 %.  Today's blood pressure is 100/76.  Her blood pressure goal is < 140/90.  Patient Instructions: 1)  continue  medications your BP and lipids look good. 2)  contnue medications. 3)  Can try  antianxiety med at night if needed . 4)  return office visit in 4 months or as needed. Prescriptions: ATIVAN 0.5 MG TABS (LORAZEPAM) 1-2 two times a  day as needed  #40 x 0   Entered and Authorized by:   Madelin Headings MD   Signed by:   Madelin Headings MD on 11/21/2009   Method used:   Print then Give to Patient   RxID:   [id number]  greater than 50% of visit spent in counseling  25 minutes

## 2010-09-13 NOTE — Letter (Signed)
Summary: [facility]  [facility]   Imported By: Maryln Gottron 05/02/2009 12:08:53  _____________________________________________________________________  External Attachment:    Type:   Image     Comment:   External Document

## 2010-09-13 NOTE — Progress Notes (Signed)
Summary: REFILLS  Phone Note Refill Request Message from:  Fax from Pharmacy  Refills Requested: Medication #1:  SYNTHROID 100 MCG  TABS 1 by mouth once daily  Medication #2:  TRIAMTERENE-HCTZ 37.5-25 MG TABS 1 by mouth once daily [facility] [place] [facility]---[phone]     FAX---[phone]  Initial call taken by: Warnell Forester,  September 15, 2009 2:41 PM  Follow-up for Phone Call        Synthroid was approved ok x 3 on 08/16/2009 and I sent the other electronically.  Follow-up by: Romualdo Bolk, CMA (AAMA),  September 15, 2009 3:56 PM    Prescriptions: TRIAMTERENE-HCTZ 37.5-25 MG TABS (TRIAMTERENE-HCTZ) 1 by mouth once daily  #30 Tablet x 2   Entered by:   Romualdo Bolk, CMA (AAMA)   Authorized by:   Madelin Headings MD   Signed by:   Romualdo Bolk, CMA (AAMA) on 09/15/2009   Method used:   [facility] to        [facility] [phone]* (retail)       [address]       [place], [place]  [address]       Ph: [phone] or [phone]       Fax: [phone]   RxID:   [id number]

## 2010-09-13 NOTE — Assessment & Plan Note (Signed)
Summary: 4 month ov//ccm   Vital Signs:  Patient profile:   70 year old female Menstrual status:  hysterectomy Height:      65.75 inches Weight:      209 pounds BMI:     34.11 Pulse rate:   60 / minute BP sitting:   110 / 70  (left arm) Cuff size:   regular  Vitals Entered By: Romualdo Bolk, CMA (AAMA) (April 04, 2010 10:16 AM) CC: 4 month rov, Hypertension Management   History of Present Illness: [name] [name] comes in today  for follow up of multiple medical problems  Since last visit  Bp Seems to be ok without se of meds  KNees  seeing ortho   keeping her from exercising considering water exercise Sinus allergy right  eye   better now and  claritin d helped.   eye check was December.  Sleep: some better  .      ocass day nap.    less than 20 minutes if needed.   no ativan .  thinks she is doing ok with the grief  Gi no change    Hypertension History:      She complains of headache, but denies chest pain, palpitations, dyspnea with exertion, orthopnea, PND, peripheral edema, visual symptoms, neurologic problems, syncope, and side effects from treatment.  She notes no problems with any antihypertensive medication side effects.  Sinus ha's.        Positive major cardiovascular risk factors include female age 38 years old or older, hyperlipidemia, and hypertension.  Negative major cardiovascular risk factors include non-tobacco-user status.     Preventive Screening-Counseling & Management  Alcohol-Tobacco     Alcohol drinks/day: 0     Smoking Status: quit     Year Quit: 06/16/1984  Caffeine-Diet-Exercise     Caffeine use/day: 1-2     Does Patient Exercise: no  Current Medications (verified): 1)  Synthroid 100 Mcg  Tabs (Levothyroxine Sodium) .Marland Kitchen.. 1 By Mouth Once Daily 2)  Simvastatin 40 Mg Tabs (Simvastatin) .Marland Kitchen.. 1 By Mouth Once Daily 3)  Triamterene-Hctz 37.5-25 Mg Tabs (Triamterene-Hctz) .Marland Kitchen.. 1 By Mouth Once Daily 4)  Flonase 50 Mcg/act Susp (Fluticasone  Propionate) .... 2 Sprays Each Nares Q D For Rhinitis 5)  Aspirin 81 Mg  Tabs (Aspirin) 6)  Multivitamins   Tabs (Multiple Vitamin) 7)  Vitamin D 1000 Unit  Tabs (Cholecalciferol) 8)  Acetaminophen 650 Mg  Tbcr (Acetaminophen) 9)  Glucosamine-Chondroitin   Caps (Glucosamine-Chondroit-Vit C-Mn)  Allergies (verified): 1)  Codeine Phosphate (Codeine Phosphate) 2)  Oxycodone Hcl (Oxycodone Hcl) 3)  Zestril (Lisinopril)  Past History:  Past medical, surgical, family and social histories (including risk factors) reviewed, and no changes noted (except as noted below).  Past Medical History: GERD Hyperlipidemia Hypertension Cancerous polyps, hx of Thyroid disease  evaluation for pulsatile tinnitus   MRA MPV normal  hyperglycemia  Abd pain eval with neg ct [place] and mri of back  djd Stern Headache ENT  eval for cough and throat    Carotid dopplers mild disease 2010 Consults Dr. Stasia Cavalier   Past Surgical History: Reviewed history from 02/05/2008 and no changes required. Hysterectomy fibroids Lumbar sx   Roxan Hockey   neuro fibroma Left shoulder arthroscopy Left middle finger A1 pulley release Thyroidectomy-partial right Ballen Isthmusectomy Tonsillectomy Colon polypectomy  Past History:  Care Management: Neurology: Venetia Maxon Orthopedics: Gram[facility]  Family History: Reviewed history from 02/05/2008 and no changes required. no change in fam hx  Family History of CAD Female  1st degree relative  father MI Family History Hypertension mom allergies  Social History: Reviewed history from 08/15/2009 and no changes required. Former Smoker Retired Alcohol use-no Drug use-no Regular exercise-no walks when she can husband Multiple myeloma  . widowed  fall 2010 hh of 1  Review of Systems  The patient denies anorexia, fever, weight loss, weight gain, chest pain, syncope, dyspnea on exertion, prolonged cough, unusual weight change, abnormal bleeding, enlarged lymph nodes, and  angioedema.         right eye allergy    knee pain   tiched with  red koolade    right toe nail  spot for a while no trauma does use nail polish  no cp sob  . no edema   Physical Exam  General:  alert, well-developed, well-nourished, and well-hydrated.   Head:  normocephalic and atraumatic.   Eyes:  vision grossly intact, pupils equal, and pupils round.no redness    Nose:  no external deformity and no nasal discharge.   Neck:  No deformities, masses, or tenderness noted. Lungs:  Normal respiratory effort, chest expands symmetrically. Lungs are clear to auscultation, no crackles or wheezes. Heart:  Normal rate and regular rhythm. S1 and S2 normal without gallop, murmur, click, rub or other extra sounds.no lifts.   Abdomen:  Bowel sounds positive,abdomen soft and non-tender without masses, organomegaly or   noted. Msk:  no swelling mild crepitus in knees Pulses:  pulses intact without delay   Extremities:  no clubbing cyanosis or edema  Neurologic:  alert & oriented X3, strength normal in all extremities, and gait normal.   Skin:  no ecchymoses and no petechiae.   Cervical Nodes:  No lymphadenopathy noted Psych:  Oriented X3, good eye contact, not anxious appearing, and not depressed appearing.     Impression & Recommendations:  Problem # 1:  HYPERTENSION (ICD-401.9)  Her updated medication list for this problem includes:    Triamterene-hctz 37.5-25 Mg Tabs (Triamterene-hctz) .Marland Kitchen... 1 by mouth once daily  BP today: 110/70 Prior BP: 100/76 (11/21/2009)  Prior 10 Yr Risk Heart Disease: 4 % (11/21/2009)  Labs Reviewed: K+: 3.7 (02/28/2009) Creat: : 0.8 (02/28/2009)   Chol: 161 (11/14/2009)   HDL: 56.00 (11/14/2009)   LDL: 88 (11/14/2009)   TG: 86.0 (11/14/2009)  Problem # 2:  BEREAVEMENT UNCOMPLICATED NEC (ICD-V62.82) progressing  anniversery fall 2011  .c  Problem # 3:  SLEEP DISORDER/DISTURBANCE (ICD-780.50) declined routine med but can  try as needed lunesta   2 mg .    samples given  Discussed risk benefit    Problem # 4:  KNEE PAIN (ICD-719.46)  under ortho care   disc water exercises   Her updated medication list for this problem includes:    Aspirin 81 Mg Tabs (Aspirin)    Acetaminophen 650 Mg Tbcr (Acetaminophen)  Orders: TLB-CBC Platelet - w/Differential (85025-CBCD) Venipuncture (98119) Specimen Handling (14782)  Problem # 5:  HYPOTHYROIDISM (ICD-244.9)  Her updated medication list for this problem includes:    Synthroid 100 Mcg Tabs (Levothyroxine sodium) .Marland Kitchen... 1 by mouth once daily  Orders: TLB-TSH (Thyroid Stimulating Hormone) (84443-TSH) TLB-BMP (Basic Metabolic Panel-BMET) (80048-METABOL) Venipuncture (95621) Specimen Handling (30865)  Labs Reviewed: TSH: 0.55 (02/28/2009)    HgBA1c: 6.1 (08/09/2009) Chol: 161 (11/14/2009)   HDL: 56.00 (11/14/2009)   LDL: 88 (11/14/2009)   TG: 86.0 (11/14/2009)  Problem # 6:  FASTING HYPERGLYCEMIA (ICD-790.29)  prediabetes type will recheck  Orders: TLB-A1C / Hgb A1C (Glycohemoglobin) (83036-A1C) Venipuncture (78469) Specimen Handling (62952)  Labs Reviewed: Creat: 0.8 (02/28/2009)     Complete Medication List: 1)  Synthroid 100 Mcg Tabs (Levothyroxine sodium) .Marland Kitchen.. 1 by mouth once daily 2)  Simvastatin 40 Mg Tabs (Simvastatin) .Marland Kitchen.. 1 by mouth once daily 3)  Triamterene-hctz 37.5-25 Mg Tabs (Triamterene-hctz) .Marland Kitchen.. 1 by mouth once daily 4)  Flonase 50 Mcg/act Susp (Fluticasone propionate) .... 2 sprays each nares q d for rhinitis 5)  Aspirin 81 Mg Tabs (Aspirin) 6)  Multivitamins Tabs (Multiple vitamin) 7)  Vitamin D 1000 Unit Tabs (Cholecalciferol) 8)  Acetaminophen 650 Mg Tbcr (Acetaminophen) 9)  Glucosamine-chondroitin Caps (Glucosamine-chondroit-vit c-mn) 10)  Lunesta 2 Mg Tabs (Eszopiclone) .Marland Kitchen.. 1 by mouth at night as needed sleep  Other Orders: TLB-Hepatic/Liver Function Pnl (80076-HEPATIC) TLB-Lipid Panel (80061-LIPID)  Hypertension Assessment/Plan:      The patient's  hypertensive risk group is category B: At least one risk factor (excluding diabetes) with no target organ damage.  Her calculated 10 year risk of coronary heart disease is 4 %.  Today's blood pressure is 110/70.  Her blood pressure goal is < 140/90.  Patient Instructions: 1)  agree with water aerobics also . 2)  can try as needed lunesta sleep aid. 3)  You will be informed of lab results when available.  4)  annual wellness visit in december

## 2010-09-13 NOTE — Progress Notes (Signed)
Summary: cold  Phone Note Call from Patient Call back at Home Phone [phone]   Caller: Patient Summary of Call: Pt states that she has a head cold, sore throat and ha since 03/14/08. She has tried tylenol cold and cough syrup. Spoke to pt- no fever just coughing, congestion and sneezing alot. Told pt to try mucinex otc and if gets sinus pressure use saline nasal spray. But if she gets sob, fever or feels worse, she needs to make an appt. Initial call taken by: Romualdo Bolk, CMA,  March 17, 2008 10:32 AM

## 2010-09-13 NOTE — Progress Notes (Signed)
Summary: Call from Dr. Annalee Genta  Phone Note From Other Clinic   Caller: Dr. Annalee Genta Summary of Call: Dr. Annalee Genta is sending you a note on pt. She is having ringing in her ears and he thinks that she needs a carotid US. If you would like to talk to him, please feel free to call him at the office. You should be getting the note next week. Initial call taken by: Romualdo Bolk, CMA,  May 10, 2008 9:17 AM  Follow-up for Phone Call        do we need to schedule this ? okif so Follow-up by: Madelin Headings MD,  May 10, 2008 4:42 PM  Additional Follow-up for Phone Call Additional follow up Details #1::        Left message to call back. Additional Follow-up by: Romualdo Bolk, CMA,  May 11, 2008 8:47 AM  New Problems: TINNITUS (ICD-388.30)   Additional Follow-up for Phone Call Additional follow up Details #2::    Spoke with pt- okay to set up. Order sent to [facility]. Romualdo Bolk, CMA  May 11, 2008 9:24 AM   New Problems: TINNITUS (ICD-388.30)

## 2010-09-13 NOTE — Procedures (Signed)
Summary: Gastroenterology  Gastroenterology   Imported By: Donneta Romberg 10/19/2007 09:21:57  _____________________________________________________________________  External Attachment:    Type:   Image     Comment:   External Document

## 2010-09-13 NOTE — Assessment & Plan Note (Signed)
Summary: flu shot//ccm   Nurse Visit   Allergies: 1)  Codeine Phosphate (Codeine Phosphate) 2)  Oxycodone Hcl (Oxycodone Hcl) 3)  Zestril (Lisinopril)  Orders Added: 1)  Admin 1st Vaccine [90471] 2)  Flu Vaccine 66yrs + [16109]    Flu Vaccine Consent Questions     Do you have a history of severe allergic reactions to this vaccine? no    Any prior history of allergic reactions to egg and/or gelatin? no    Do you have a sensitivity to the preservative Thimersol? no    Do you have a past history of Guillan-Barre Syndrome? no    Do you currently have an acute febrile illness? no    Have you ever had a severe reaction to latex? no    Vaccine information given and explained to patient? yes    Are you currently pregnant? no    Lot Number:[id number]   Exp Date:02/09/2011   Site Given  Left Deltoid IM Romualdo Bolk, CMA (AAMA)  June 11, 2010 10:09 AM

## 2010-09-13 NOTE — Assessment & Plan Note (Signed)
Summary: FLU SHOT/NJR   Nurse Visit    Prior Medications: SYNTHROID 100 MCG  TABS (LEVOTHYROXINE SODIUM) 1 by mouth once daily CRESTOR 40 MG TABS (ROSUVASTATIN CALCIUM)  TRIAMTERENE-HCTZ 37.5-25 MG TABS (TRIAMTERENE-HCTZ) 1 by mouth once daily NEXIUM 40 MG  CPDR (ESOMEPRAZOLE MAGNESIUM)  AMOXICILLIN 875 MG  TABS (AMOXICILLIN) 1 by mouth three times a day NASACORT AQ 55 MCG/ACT  AERS (TRIAMCINOLONE ACETONIDE(NASAL))  Current Allergies: CODEINE PHOSPHATE (CODEINE PHOSPHATE) OXYCODONE HCL (OXYCODONE HCL) ZESTRIL (LISINOPRIL)  Flu Vaccine Consent Questions     Do you have a history of severe allergic reactions to this vaccine? no    Any prior history of allergic reactions to egg and/or gelatin? no    Do you have a sensitivity to the preservative Thimersol? no    Do you have a past history of Guillan-Barre Syndrome? no    Do you currently have an acute febrile illness? no    Have you ever had a severe reaction to latex? no    Vaccine information given and explained to patient? yes    Are you currently pregnant? no    Lot Number:[id number]   Site Given  Left Deltoid IM   Orders Added: 1)  Flu Vaccine 64yrs + [16109] 2)  Administration Flu vaccine [G0008]    ]

## 2010-09-13 NOTE — Assessment & Plan Note (Signed)
Summary: emp---will labs//ccm   Vital Signs:  Patient profile:   70 year old female Menstrual status:  hysterectomy Height:      66 inches Weight:      209 pounds BMI:     33.86 Pulse rate:   78 / minute BP sitting:   120 / 80  (left arm) Cuff size:   large  Vitals Entered By: Romualdo Bolk, CMA (AAMA) (July 24, 2010 10:04 AM)  Nutrition Counseling: Patient's BMI is greater than 25 and therefore counseled on weight management options. CC: Annual Visit for Disease Management   History of Present Illness: [name] [name] comes in today   for annual wellness visit .  And medical management  Here for Medicare AWV:  1.   Risk factors based on Past M, S, F history:   2.   Physical Activities:   no reg exercise  but going to do water aerobic.  to go to [employer].   3.   Depression/mood:  4.   Hearing:  good  5.   ADL's: Independent.  drives car  6.   Fall Risk:  none   problem with knees at times  7.   Home Safety:  8.   Height, weight, &visual acuity:    due for eye check  has eye glasses  9.   Counseling:  10.   Labs ordered based on risk factors:  11.           Referral Coordination 12.           Care Plan  disc  End of life    to get son to be POA  discussed with family.  13.            Cognitive Assessment  Pt is A&Ox3,affect,speech,memory,attention,&motor skills appear intact.   had 12 week  aging [employer] testing   did very  well.  on reasoning.  and memorization Thyroid: no change in medds   sometimes anxious and on edge  no palpitations  Hyperglycemia: LIPIDS:  no se ofmeds  HT:  controlled Sleep.   still problem  even with lunesta  thinks anxiety and mind racing   Preventive Care Screening  Colonoscopy:    Date:  07/16/2007    Results:  normal   Last Tetanus Booster:    Date:  08/12/2004    Results:  Historical   Prior Values:    Mammogram:  ASSESSMENT: Negative - BI-RADS 1^MM DIGITAL SCREENING (12/21/2009)    Last Flu Shot:  Fluvax 3+  (06/11/2010)    Last Pneumovax:  Pneumovax (02/05/2008)   Preventive Screening-Counseling & Management  Alcohol-Tobacco     Alcohol drinks/day: 0     Smoking Status: quit     Year Quit: 06/16/1984  Caffeine-Diet-Exercise     Caffeine use/day: 1-2     Does Patient Exercise: no  Hep-HIV-STD-Contraception     Dental Visit-last 6 months no     Dental Care Counseling: to seek dental care; no dental care within six months  Safety-Violence-Falls     Seat Belt Use: yes     Firearms in the Home: no firearms in the home     Smoke Detectors: yes     Fall Risk: none       Blood Transfusions:  no.    Current Medications (verified): 1)  Synthroid 100 Mcg  Tabs (Levothyroxine Sodium) .Marland Kitchen.. 1 By Mouth Once Daily 2)  Simvastatin 40 Mg Tabs (Simvastatin) .Marland Kitchen.. 1 By Mouth Once Daily 3)  Triamterene-Hctz 37.5-25 Mg Tabs (Triamterene-Hctz) .Marland Kitchen.. 1 By Mouth Once Daily 4)  Flonase 50 Mcg/act Susp (Fluticasone Propionate) .... 2 Sprays Each Nares Q D For Rhinitis 5)  Aspirin 81 Mg  Tabs (Aspirin) 6)  Multivitamins   Tabs (Multiple Vitamin) 7)  Vitamin D 1000 Unit  Tabs (Cholecalciferol) 8)  Acetaminophen 650 Mg  Tbcr (Acetaminophen) 9)  Glucosamine-Chondroitin   Caps (Glucosamine-Chondroit-Vit C-Mn) 10)  Lunesta 2 Mg Tabs (Eszopiclone) .Marland Kitchen.. 1 By Mouth At Night As Needed Sleep  Allergies (verified): 1)  Codeine Phosphate (Codeine Phosphate) 2)  Oxycodone Hcl (Oxycodone Hcl) 3)  Zestril (Lisinopril)  Past History:  Past medical, surgical, family and social histories (including risk factors) reviewed, and no changes noted (except as noted below).  Past Medical History: Reviewed history from 04/04/2010 and no changes required. GERD Hyperlipidemia Hypertension Cancerous polyps, hx of Thyroid disease  evaluation for pulsatile tinnitus   MRA MPV normal  hyperglycemia  Abd pain eval with neg ct [place] and mri of back  djd Stern Headache ENT  eval for cough and throat    Carotid dopplers mild  disease 2010 Consults Dr. Stasia Cavalier   Past Surgical History: Reviewed history from 02/05/2008 and no changes required. Hysterectomy fibroids Lumbar sx   Roxan Hockey   neuro fibroma Left shoulder arthroscopy Left middle finger A1 pulley release Thyroidectomy-partial right Ballen Isthmusectomy Tonsillectomy Colon polypectomy  Past History:  Care Management: Neurology: Venetia Maxon Orthopedics: Amanda Pea Gastroenterology: Leone Payor Cardiology: Katrinka Blazing  Family History: Reviewed history from 02/05/2008 and no changes required. no change in fam hx  Family History of CAD Female 1st degree relative  father MI Family History Hypertension mom allergies  Social History: Reviewed history from 04/04/2010 and no changes required. Former Smoker Retired Alcohol use-no Drug use-no Regular exercise-no walks when she can husband Multiple myeloma  . widowed  fall 2010 hh of 1 no pets Dental Care w/in 6 mos.:  no Seat Belt Use:  yes Fall Risk:  none  Blood Transfusions:  no  Review of Systems  The patient denies anorexia, fever, weight loss, weight gain, decreased hearing, hoarseness, syncope, dyspnea on exertion, peripheral edema, prolonged cough, melena, hematochezia, severe indigestion/heartburn, muscle weakness, transient blindness, difficulty walking, abnormal bleeding, enlarged lymph nodes, angioedema, and breast masses.         gets right chest soreness like a cold  but told she had arthritis in her chest  comes and goes no sweatting and not exercise associated  no breast masses ...knees still hyurts  left back side pulled  at thanks giving  no weakness or numbness  Physical Exam  General:  Well-developed,well-nourished,in no acute distress; alert,appropriate and cooperative throughout examination Head:  normocephalic and atraumatic.   Eyes:  PERRL, EOMs full, conjunctiva clear  Ears:  R ear normal, L ear normal, and no external deformities.   Nose:  no external deformity and no nasal  discharge.   Mouth:  pharynx pink and moist.  upper dentures  Neck:  No deformities, masses, or tenderness noted. dont hear a bruit Chest Wall:  tender left cc junction about t89 Breasts:  No mass, nodules, thickening, tenderness, bulging, retraction, inflamation, nipple discharge or skin changes noted.   Lungs:  Normal respiratory effort, chest expands symmetrically. Lungs are clear to auscultation, no crackles or wheezes.no dullness.   Heart:  Normal rate and regular rhythm. S1 and S2 normal without gallop, murmur, click, rub or other extra sounds. Abdomen:  Bowel sounds positive,abdomen soft and non-tender without masses, organomegaly or hernias noted. Genitalia:  had hysterectomy  Msk:  no joint swelling, no joint warmth, and no redness over joints.  no joint swelling, no joint warmth, and no redness over joints.   Pulses:  pulses intact without delay   Extremities:  no clubbing cyanosis or edema  Neurologic:  alert & oriented X3, strength normal in all extremities, gait normal, and DTRs symmetrical and normal.   Pt is A&Ox3,affect,speech,memory,attention,&motor skills appear intact.  Skin:  turgor normal, color normal, no ecchymoses, and no petechiae.  nails dry no onycholysis turgor normal, color normal, no ecchymoses, and no petechiae.   Cervical Nodes:  No lymphadenopathy noted Axillary Nodes:  No palpable lymphadenopathy Inguinal Nodes:  No significant adenopathy Psych:  Normal eye contact, appropriate affect. Cognition appears normal.  EKG NSR   Impression & Recommendations:  Problem # 1:  PREVENTIVE HEALTH CARE (ICD-V70.0) counseled   Discussed nutrition,exercise,diet,healthy weight, vitamin D and calcium.   end of life  safety and fall risk . Orders: Medicare -1st Annual Wellness Visit [phone])  Problem # 2:  HYPERTENSION (ICD-401.9) Assessment: Unchanged Her updated medication list for this problem includes:    Triamterene-hctz 37.5-25 Mg Tabs (Triamterene-hctz) .Marland Kitchen... 1 by  mouth once daily  Orders: TLB-BMP (Basic Metabolic Panel-BMET) (80048-METABOL) Specimen Handling (56213) Venipuncture (08657) EKG w/ Interpretation (93000)  BP today: 120/80 Prior BP: 110/70 (04/04/2010)  Prior 10 Yr Risk Heart Disease: 4 % (11/21/2009)  Labs Reviewed: K+: 4.1 (04/04/2010) Creat: : 0.8 (04/04/2010)   Chol: 170 (04/04/2010)   HDL: 48.70 (04/04/2010)   LDL: 101 (04/04/2010)   TG: 104.0 (04/04/2010)  Problem # 3:  HYPOTHYROIDISM (ICD-244.9) recheck with hx of jitteriness Her updated medication list for this problem includes:    Synthroid 100 Mcg Tabs (Levothyroxine sodium) .Marland Kitchen... 1 by mouth once daily  Orders: TLB-TSH (Thyroid Stimulating Hormone) (84443-TSH) Specimen Handling (84696) Venipuncture (29528)  Labs Reviewed: TSH: 0.79 (04/04/2010)    HgBA1c: 6.2 (04/04/2010) Chol: 170 (04/04/2010)   HDL: 48.70 (04/04/2010)   LDL: 101 (04/04/2010)   TG: 104.0 (04/04/2010)  Problem # 4:  FASTING HYPERGLYCEMIA (ICD-790.29) doing better  diet wise  per patient Orders: TLB-A1C / Hgb A1C (Glycohemoglobin) (83036-A1C) Specimen Handling (41324) Venipuncture (40102)  Problem # 5:  ADJUSTMENT DISORDER WITH ANXIETY (ICD-309.24) affecting sleep    would avoid valerian for now as dont know drug IA and is a supplement .   Discussed risk benefit  of benzos and will try  as needed for sleep etc.     counseled   Problem # 6:  CAROTID ARTERY DISEASEDOPPLER 2010 MILD DISEASE (ICD-433.10) no change this year low risk  will repeat in 2 years  fall of 2013 Her updated medication list for this problem includes:    Aspirin 81 Mg Tabs (Aspirin)  Problem # 7:  CHEST WALL PAIN, ANTERIOR (ICD-786.52) poss  costochondritis    .   counseled  Her updated medication list for this problem includes:    Aspirin 81 Mg Tabs (Aspirin)  Complete Medication List: 1)  Synthroid 100 Mcg Tabs (Levothyroxine sodium) .Marland Kitchen.. 1 by mouth once daily 2)  Simvastatin 40 Mg Tabs (Simvastatin) .Marland Kitchen.. 1 by  mouth once daily 3)  Triamterene-hctz 37.5-25 Mg Tabs (Triamterene-hctz) .Marland Kitchen.. 1 by mouth once daily 4)  Flonase 50 Mcg/act Susp (Fluticasone propionate) .... 2 sprays each nares q d for rhinitis 5)  Aspirin 81 Mg Tabs (Aspirin) 6)  Multivitamins Tabs (Multiple vitamin) 7)  Vitamin D 1000 Unit Tabs (Cholecalciferol) 8)  Acetaminophen 650 Mg Tbcr (Acetaminophen) 9)  Glucosamine-chondroitin Caps (Glucosamine-chondroit-vit c-mn) 10)  Lunesta 2 Mg Tabs (Eszopiclone) .Marland Kitchen.. 1 by mouth at night as needed sleep 11)  Lorazepam 0.5 Mg Tabs (Lorazepam) .Marland Kitchen.. 1-2 by mouth two times a day as needed anxiety  Other Orders: TLB-Lipid Panel (80061-LIPID)  Patient Instructions: 1)  You will be informed of lab results when available.  2)  return office visit in 6 months or as needed.  for the sleep anxiety issue 3)  Can try antianxiety  med if needed .  can be habit  producing if taken on a regular  basis . 4)  we can try other thinks if needed. 5)  Do not take lunesta at the same time as ativan . Prescriptions: LORAZEPAM 0.5 MG TABS (LORAZEPAM) 1-2 by mouth two times a day as needed anxiety  #30 x 0   Entered and Authorized by:   Madelin Headings MD   Signed by:   Madelin Headings MD on 07/24/2010   Method used:   Print then Give to Patient   RxID:   [id number]    Orders Added: 1)  TLB-TSH (Thyroid Stimulating Hormone) [84443-TSH] 2)  TLB-Lipid Panel [80061-LIPID] 3)  TLB-BMP (Basic Metabolic Panel-BMET) [80048-METABOL] 4)  TLB-A1C / Hgb A1C (Glycohemoglobin) [83036-A1C] 5)  Specimen Handling [99000] 6)  Venipuncture [36415] 7)  EKG w/ Interpretation [93000] 8)  Medicare -1st Annual Wellness Visit [G0438] 9)  Est. Patient Level III [29528]

## 2010-09-13 NOTE — Letter (Signed)
Summary: release  release   Imported By: Kassie Mends 04/04/2008 16:21:31  _____________________________________________________________________  External Attachment:    Type:   Image     Comment:   release

## 2010-09-13 NOTE — Letter (Signed)
Summary: cardiac catheterization note  cardiac catheterization note   Imported By: Kassie Mends 04/23/2007 08:53:08  _____________________________________________________________________  External Attachment:    Type:   Image     Comment:   cardiac catheterization note

## 2010-09-13 NOTE — Progress Notes (Signed)
Summary: wants something mild to help her sleep  Phone Note Call from Patient Call back at Home Phone [phone]   Caller: Patient Summary of Call: Pt's husband passes away 2 weeks ago and she is wanting something mild to help her rest and sleep. Can we call something in? Initial call taken by: Romualdo Bolk, CMA (AAMA),  July 18, 2009 1:31 PM  Follow-up for Phone Call        ativan .5   can take 1-2 by mouth two times a day as needed anxiety  sleep.    20# Condolences  Follow-up by: Madelin Headings MD,  July 18, 2009 4:56 PM  Additional Follow-up for Phone Call Additional follow up Details #1::        Pt aware and rx called in. Additional Follow-up by: Romualdo Bolk, CMA (AAMA),  July 18, 2009 5:13 PM    New/Updated Medications: ATIVAN 0.5 MG TABS (LORAZEPAM) 1-2 two times a day Prescriptions: ATIVAN 0.5 MG TABS (LORAZEPAM) 1-2 two times a day  #20 x 0   Entered by:   Romualdo Bolk, CMA (AAMA)   Authorized by:   Madelin Headings MD   Signed by:   Romualdo Bolk, CMA (AAMA) on 07/18/2009   Method used:   Telephoned to ...       [facility] [phone]       [phone] [address]       [place], [place]  [address]       Ph: [phone] or [phone]       Fax: [phone]   RxID:   [id number]

## 2010-09-13 NOTE — Assessment & Plan Note (Signed)
Summary: pt will come in fasting/njr   Vital Signs:  Patient Profile:   70 Years Old Female Height:     66 inches Weight:      215 pounds Temp:     98.7 degrees F oral BP sitting:   130 / 80  (left arm) Cuff size:   regular  Vitals Entered By: Sid Falcon LPN (February 05, 2008 9:25 AM)                 Chief Complaint:  Health Maintenance Exam, fasting, and has appt with Dr Pennie Rushing for pap.  History of Present Illness: [name] [name] come in today for yearly check. Has a number of issues to be addressed Eye sx from allergies  to try cLaritin D   with some help ? if thyroid  Had [place] and Bx and benign nodule.  Hard time getting follow up and since was stable hasnt had any official follow up . still has intermittent left sided throat pressure when lays down at times . Has snoring but no dx OSA .   Current Problems:  ANEMIA (ICD-285.9) OBESITY, MILD (ICD-278.0  had lsit weight but gaining again HYPOTHYROIDISM (ICD-244.9)    on brand  replacement  RHINITIS (ICD-477.9) chronic ? allergic  FASTING HYPERGLYCEMIA (ICD-790.29)  usually 110 to 120   insurance wont pay for strips because no dx of dm  HYPERTENSION (ICD-401.9) doing well and no problems now   last EKG 2007 HYPERLIPIDEMIA (ICD-272.4) crestor without problem GERD (ICD-530.81  ibs  doing well.   Td   at dr Everardo All ? in the last 10 years PAp to see GYN  Mammogram 2009  Never had pneumovax  .     Current Allergies: CODEINE PHOSPHATE (CODEINE PHOSPHATE) OXYCODONE HCL (OXYCODONE HCL) ZESTRIL (LISINOPRIL)  Past Medical History:    GERD    Hyperlipidemia    Hypertension    Cancerous polyps, hx of    Thyroid disease     evaluation for pulsatile tinnitus   MRA MPV normal     hyperglycemia     Abd pain eval with neg ct [place] and mri of back  djd Stern    Headache  Past Surgical History:    Hysterectomy fibroids    Lumbar sx   Robinson   neuro fibroma    Left shoulder arthroscopy    Left middle finger A1 pulley release    Thyroidectomy-partial right Ballen    Isthmusectomy    Tonsillectomy    Colon polypectomy   Family History:    no change in fam hx     Family History of CAD Female 1st degree relative  father MI    Family History Hypertension mom    allergies  Social History:    Former Smoker    Retired    Alcohol use-no    Drug use-no    Regular exercise-no walks when she can    Married    husband dx with Multiple myeloma  .    hh of 2  no pets     Review of Systems  The patient denies anorexia, fever, prolonged cough, hemoptysis, melena, hematochezia, severe indigestion/heartburn, muscle weakness, difficulty walking, abnormal bleeding, and enlarged lymph nodes.         arthritis in shoulder   , ? flushes back some like when her thyroid was off.,  gerd better    Physical Exam  General:     Well-developed,well-nourished,in no acute distress; alert,appropriate and cooperative throughout examination Head:  normocephalic and atraumatic.   Eyes:     vision grossly intact, pupils equal, and pupils round.  glasses Ears:     R ear normal, L ear normal, and no external deformities.   Nose:     no external deformity and no nasal discharge.   Mouth:     pharynx pink and moist and no erythema.   Neck:     supple and full ROM.  left thyroid low and enlarged   non tender  Lungs:     Normal respiratory effort, chest expands symmetrically. Lungs are clear to auscultation, no crackles or wheezes.no dullness.   Heart:     Normal rate and regular rhythm. S1 and S2 normal without gallop, murmur, click, rub or other extra sounds. Abdomen:     Bowel sounds positive,abdomen soft and non-tender without masses, organomegaly or hernias noted. Msk:     normal ROM, no joint swelling, and no redness over joints.   Extremities:     no cce  Neurologic:     alert & oriented X3, gait normal, and DTRs symmetrical and normal.   Skin:     turgor normal, color normal, no ecchymoses, and no petechiae.    Cervical Nodes:     no anterior cervical adenopathy and no posterior cervical adenopathy.   Axillary Nodes:     no R axillary adenopathy and no L axillary adenopathy.   Inguinal Nodes:     no R inguinal adenopathy and no L inguinal adenopathy.   Psych:     Oriented X3, normally interactive, and good eye contact.      Impression & Recommendations:  Problem # 1:  HYPERTENSION (ICD-401.9)  Her updated medication list for this problem includes:    Triamterene-hctz 37.5-25 Mg Tabs (Triamterene-hctz)  Orders: TLB-BMP (Basic Metabolic Panel-BMET) (80048-METABOL)   Problem # 2:  HYPERLIPIDEMIA (ICD-272.4)  Her updated medication list for this problem includes:    Crestor 40 Mg Tabs (Rosuvastatin calcium)  Orders: TLB-Hepatic/Liver Function Pnl (80076-HEPATIC) TLB-Lipid Panel (80061-LIPID)   Problem # 3:  HYPOTHYROIDISM (ICD-244.9)  Her updated medication list for this problem includes:    Synthroid 100 Mcg Tabs (Levothyroxine sodium) .Marland Kitchen... 1 by mouth once daily  Orders: TLB-TSH (Thyroid Stimulating Hormone) (84443-TSH)   Problem # 4:  OBESITY, MILD (ICD-278.02) Assessment: Comment Only  Problem # 5:  THYROID NODULE (ICD-241.0) reported neg bx last year but I dont see these results in the paper record . Pts one sided ? dysphagia still present and could be gi related however thyroid goiter could still be related  May need endo ord ent to see.    will review chart and let her know.  about follow up   Problem # 6:  RHINITIS (ICD-477.9)  Her updated medication list for this problem includes:    Nasacort Aq 55 Mcg/act Aers (Triamcinolone acetonide(nasal))   Problem # 7:  FASTING HYPERGLYCEMIA (ICD-790.29) Assessment: Comment Only  Orders: TLB-A1C / Hgb A1C (Glycohemoglobin) (83036-A1C)   Problem # 8:  HIATAL HERNIA (ICD-553.3) Assessment: Comment Only  Problem # 9:  IRRITABLE BOWEL SYNDROME (ICD-564.1) Assessment: Comment Only  Complete Medication List: 1)   Synthroid 100 Mcg Tabs (Levothyroxine sodium) .Marland Kitchen.. 1 by mouth once daily 2)  Crestor 40 Mg Tabs (Rosuvastatin calcium) 3)  Triamterene-hctz 37.5-25 Mg Tabs (Triamterene-hctz) 4)  Ascensia Contour Test Strp (Glucose blood) .... Use as directed 5)  Ascensia Microlet Misc (Lancets) .... Use as directed 6)  Nexium 40 Mg Cpdr (Esomeprazole magnesium) 7)  Amoxicillin  875 Mg Tabs (Amoxicillin) .Marland Kitchen.. 1 by mouth three times a day 8)  Nasacort Aq 55 Mcg/act Aers (Triamcinolone acetonide(nasal))  Other Orders: TLB-CBC Platelet - w/Differential (85025-CBCD) T-Vitamin D (25-Hydroxy) (16109-60454) Pneumococcal Vaccine (09811) Admin 1st Vaccine (91478)   Patient Instructions: 1)  will let you know and mail your lab results 2)  Plan follow up depending on your labs. 3)  will review your chart about thyroid issue and consider endocrinology opinion.   ]  Pneumovax Vaccine    Vaccine Type: Pneumovax    Site: left deltoid    Mfr: Merck    Dose: 0.5 ml    Route: IM    Given by: Sid Falcon LPN    Exp. Date: 12/25/2008    Lot #: [id number]

## 2010-09-13 NOTE — Assessment & Plan Note (Signed)
Summary: follow up on labs/ssc   Vital Signs:  Patient profile:   70 year old female Menstrual status:  hysterectomy Weight:      204 pounds Pulse rate:   66 / minute BP sitting:   120 / 80  (left arm) Cuff size:   large  Vitals Entered By: Romualdo Bolk, CMA (AAMA) (August 15, 2009 10:28 AM) CC: Follow-up visit on labs, Hypertension Management   History of Present Illness: [name] [name] comes  in   today for   follow up of multiple medical problems.   Her husband passed away since last visit.  She is coping and has support system. LIPID: changed meds as a trila cause of cost. NO se of meds ( simvastatin)  changed from crestor.  HT no change .  Thyroid : no change  Carotid disease: no neuro or tia symptom . BG: no chnage in diet    Hypertension History:      She denies headache, chest pain, palpitations, dyspnea with exertion, orthopnea, PND, peripheral edema, visual symptoms, neurologic problems, syncope, and side effects from treatment.  She notes no problems with any antihypertensive medication side effects.        Positive major cardiovascular risk factors include female age 30 years old or older, hyperlipidemia, and hypertension.  Negative major cardiovascular risk factors include non-tobacco-user status.     Preventive Screening-Counseling & Management  Alcohol-Tobacco     Alcohol drinks/day: 0     Smoking Status: quit     Year Quit: 06/16/1984  Caffeine-Diet-Exercise     Caffeine use/day: 1-2     Does Patient Exercise: no  Current Medications (verified): 1)  Synthroid 100 Mcg  Tabs (Levothyroxine Sodium) .Marland Kitchen.. 1 By Mouth Once Daily 2)  Simvastatin 40 Mg Tabs (Simvastatin) .Marland Kitchen.. 1 By Mouth Once Daily 3)  Triamterene-Hctz 37.5-25 Mg Tabs (Triamterene-Hctz) .Marland Kitchen.. 1 By Mouth Once Daily 4)  Nexium 40 Mg  Cpdr (Esomeprazole Magnesium) 5)  Flonase 50 Mcg/act Susp (Fluticasone Propionate) .... 2 Sprays Each Nares Q D For Rhinitis 6)  Ativan 0.5 Mg Tabs (Lorazepam)  .Marland Kitchen.. 1-2 Two Times A Day 7)  Aspirin 81 Mg  Tabs (Aspirin) 8)  Multivitamins   Tabs (Multiple Vitamin) 9)  Vitamin D 1000 Unit  Tabs (Cholecalciferol) 10)  Gabapentin 100 Mg Caps (Gabapentin) .Marland Kitchen.. 1 By Mouth Once Daily As Needed  Allergies (verified): 1)  Codeine Phosphate (Codeine Phosphate) 2)  Oxycodone Hcl (Oxycodone Hcl) 3)  Zestril (Lisinopril)  Past History:  Past medical, surgical, family and social histories (including risk factors) reviewed, and no changes noted (except as noted below).  Past Medical History: Reviewed history from 02/28/2009 and no changes required. GERD Hyperlipidemia Hypertension Cancerous polyps, hx of Thyroid disease  evaluation for pulsatile tinnitus   MRA MPV normal  hyperglycemia  Abd pain eval with neg ct [place] and mri of back  djd Stern Headache ENT  eval for cough and throat     Consults Dr. Stasia Cavalier   Past Surgical History: Reviewed history from 02/05/2008 and no changes required. Hysterectomy fibroids Lumbar sx   Roxan Hockey   neuro fibroma Left shoulder arthroscopy Left middle finger A1 pulley release Thyroidectomy-partial right Ballen Isthmusectomy Tonsillectomy Colon polypectomy  Family History: Reviewed history from 02/05/2008 and no changes required. no change in fam hx  Family History of CAD Female 1st degree relative  father MI Family History Hypertension mom allergies  Social History: Reviewed history from 02/28/2009 and no changes required. Former Smoker Retired Alcohol use-no  Drug use-no Regular exercise-no walks when she can husband dx with Multiple myeloma  . widowed this fall 2010 hh of 1  Review of Systems  The patient denies anorexia, fever, weight loss, weight gain, vision loss, decreased hearing, prolonged cough, abdominal pain, melena, hematochezia, severe indigestion/heartburn, hematuria, difficulty walking, unusual weight change, abnormal bleeding, enlarged lymph nodes, and angioedema.    Physical  Exam  General:  Well-developed,well-nourished,in no acute distress; alert,appropriate and cooperative throughout examination Head:  normocephalic and atraumatic.   Eyes:  vision grossly intact and pupils equal.   Neck:  No deformities, masses, or tenderness noted. Lungs:  Normal respiratory effort, chest expands symmetrically. Lungs are clear to auscultation, no crackles or wheezes. Heart:  normal rate, regular rhythm, and no gallop.   Extremities:  nl cap reill  Neurologic:  non focal  Skin:  turgor normal and color normal.   Cervical Nodes:  No lymphadenopathy noted Psych:  Oriented X3, good eye contact, and not anxious appearing.  somewhat subdued but normal for situation Additional Exam:  see labs  and  reviewed    Impression & Recommendations:  Problem # 1:  HYPERLIPIDEMIA (ICD-272.4)  not as good after switch but had missed some doses  so ok to continue for now and recheck . if not at goal then can switch back to more potent statin Her updated medication list for this problem includes:    Simvastatin 40 Mg Tabs (Simvastatin) .Marland Kitchen... 1 by mouth once daily  Labs Reviewed: SGOT: 27 (08/09/2009)   SGPT: 17 (08/09/2009)  10 Yr Risk Heart Disease: 7 % Prior 10 Yr Risk Heart Disease: 8 % (02/28/2009)   HDL:52.60 (08/09/2009), 45.70 (02/28/2009)  LDL:63 (02/05/2008), DEL (16/05/9603)  Chol:211 (08/09/2009), 134 (02/28/2009)  Trig:159.0 (08/09/2009), 252.0 (02/28/2009)  Problem # 2:  HYPOTHYROIDISM (ICD-244.9)  Her updated medication list for this problem includes:    Synthroid 100 Mcg Tabs (Levothyroxine sodium) .Marland Kitchen... 1 by mouth once daily  Labs Reviewed: TSH: 0.55 (02/28/2009)    HgBA1c: 6.1 (08/09/2009) Chol: 211 (08/09/2009)   HDL: 52.60 (08/09/2009)   LDL: 63 (02/05/2008)   TG: 159.0 (08/09/2009)  Problem # 3:  CAROTID ARTERY DISEASE (ICD-433.10)  Her updated medication list for this problem includes:    Aspirin 81 Mg Tabs (Aspirin)  Problem # 4:  BEREAVEMENT  UNCOMPLICATED NEC (ICD-V62.82) discussion.   Problem # 5:  FASTING HYPERGLYCEMIA (ICD-790.29) Assessment: Unchanged  Labs Reviewed: Creat: 0.8 (02/28/2009)     Complete Medication List: 1)  Synthroid 100 Mcg Tabs (Levothyroxine sodium) .Marland Kitchen.. 1 by mouth once daily 2)  Simvastatin 40 Mg Tabs (Simvastatin) .Marland Kitchen.. 1 by mouth once daily 3)  Triamterene-hctz 37.5-25 Mg Tabs (Triamterene-hctz) .Marland Kitchen.. 1 by mouth once daily 4)  Nexium 40 Mg Cpdr (Esomeprazole magnesium) 5)  Flonase 50 Mcg/act Susp (Fluticasone propionate) .... 2 sprays each nares q d for rhinitis 6)  Ativan 0.5 Mg Tabs (Lorazepam) .Marland Kitchen.. 1-2 two times a day 7)  Aspirin 81 Mg Tabs (Aspirin) 8)  Multivitamins Tabs (Multiple vitamin) 9)  Vitamin D 1000 Unit Tabs (Cholecalciferol) 10)  Gabapentin 100 Mg Caps (Gabapentin) .Marland Kitchen.. 1 by mouth once daily as needed  Hypertension Assessment/Plan:      The patient's hypertensive risk group is category B: At least one risk factor (excluding diabetes) with no target organ damage.  Her calculated 10 year risk of coronary heart disease is 7 %.  Today's blood pressure is 120/80.  Her blood pressure goal is < 140/90.  Patient Instructions: 1)  continue  simvastatin   for  now and 2)   repeat    lipids  in 3 months  3)  then ROV  to decide on further  4)  call in meantime if needed.  5)  With your medical conditions  need LDL below 100 and preferably below 70-80

## 2010-09-13 NOTE — Miscellaneous (Signed)
Summary: Orders Update  Clinical Lists Changes  Orders: Added new Test order of Carotid Duplex (Carotid Duplex) - Signed 

## 2010-09-13 NOTE — Assessment & Plan Note (Signed)
Summary: uti?/mhf   Vital Signs:  Patient Profile:   70 Years Old Female Height:     66 inches Weight:      212 pounds Temp:     98.3 degrees F oral Pulse rate:   78 / minute BP sitting:   120 / 80  (left arm) Cuff size:   large  Vitals Entered By: Romualdo Bolk, CMA (September 23, 2008 10:04 AM)  Menstrual History: LMP - Character: Hyst 1982 Menarche: 15                 Chief Complaint:  burning upon urination.  History of Present Illness: [name] [name] is here for SDA  burning upon urination x 2 weeks ago.    No fever or flank pain but and r low back pain . NO meds for this. Husband in hospital  with multiple medical problem.Has been sleeping on hospital couch.   Interim hx. :  Saw  ENT in past for swallowing issue and felt to be reflux so takes nexium   in the pm to help with this and is controlled .       Prior Medications Reviewed Using: Patient Recall  Updated Prior Medication List: SYNTHROID 100 MCG  TABS (LEVOTHYROXINE SODIUM) 1 by mouth once daily [BMN] CRESTOR 40 MG TABS (ROSUVASTATIN CALCIUM) one once daily TRIAMTERENE-HCTZ 37.5-25 MG TABS (TRIAMTERENE-HCTZ) 1 by mouth once daily NEXIUM 40 MG  CPDR (ESOMEPRAZOLE MAGNESIUM)   Current Allergies (reviewed today): CODEINE PHOSPHATE (CODEINE PHOSPHATE) OXYCODONE HCL (OXYCODONE HCL) ZESTRIL (LISINOPRIL)  Past Medical History:    GERD    Hyperlipidemia    Hypertension    Cancerous polyps, hx of    Thyroid disease     evaluation for pulsatile tinnitus   MRA MPV normal     hyperglycemia     Abd pain eval with neg ct [place] and mri of back  djd Stern    Headache            Consults    Dr. Stasia Cavalier    Social History:    Former Smoker    Retired    Alcohol use-no    Drug use-no    Regular exercise-no walks when she can    Married    husband dx with Multiple myeloma  .    hh of 2  no pets      Review of Systems  The patient denies anorexia, fever, melena, hematochezia, difficulty  walking, abnormal bleeding, and enlarged lymph nodes.         has arthritis in back and sometimes down legs .  worse after sleeping in hospital recliner  for almost 2 weeks    Physical Exam  General:     Well-developed,well-nourished,in no acute distress; alert,appropriate and cooperative throughout examination Head:     normocephalic and atraumatic.   Lungs:     normal respiratory effort and no intercostal retractions.   Heart:     normal rate and regular rhythm.   Abdomen:     soft, no hepatomegaly, and no splenomegaly.  tender lower abdomen suprapoubic area   no flank pain Msk:     some right si area tenderness Neurologic:     non focal gait normal.   Skin:     turgor normal and color normal.      Impression & Recommendations:  Problem # 1:  DYSURIA (ICD-788.1) UTI . reviewed rx and findings  The following medications were removed from the medication list:  Amoxicillin 875 Mg Tabs (Amoxicillin) .Marland Kitchen... 1 by mouth three times a day  Her updated medication list for this problem includes:    Cipro 500 Mg Tabs (Ciprofloxacin hcl) .Marland Kitchen... 1 by mouth two times a day for 7 days  or as directed  Orders: UA Dipstick w/o Micro (automated)  (81003) T-Culture, Urine ([address]-60454)   Complete Medication List: 1)  Synthroid 100 Mcg Tabs (Levothyroxine sodium) .Marland Kitchen.. 1 by mouth once daily 2)  Crestor 40 Mg Tabs (Rosuvastatin calcium) .... One once daily 3)  Triamterene-hctz 37.5-25 Mg Tabs (Triamterene-hctz) .Marland Kitchen.. 1 by mouth once daily 4)  Nexium 40 Mg Cpdr (Esomeprazole magnesium) 5)  Cipro 500 Mg Tabs (Ciprofloxacin hcl) .Marland Kitchen.. 1 by mouth two times a day for 7 days  or as directed   Patient Instructions: 1)  will call you when culture results are back . 2)  call if not better or fever .      Prescriptions: CIPRO 500 MG TABS (CIPROFLOXACIN HCL) 1 by mouth two times a day for 7 days  or as directed  #14 x 0   Entered and Authorized by:   Madelin Headings MD   Signed by:   Madelin Headings MD on 09/23/2008   Method used:   [facility]        [facility] [phone]* (retail)       [address], [place]  [address]       Ph: [phone] or [phone]       Fax: [phone]   RxID:   [id number]   Laboratory Results   Urine Tests    Routine Urinalysis   Color: yellow Appearance: Clear Glucose: negative   (Normal Range: Negative) Bilirubin: negative   (Normal Range: Negative) Ketone: negative   (Normal Range: Negative) Spec. Gravity: 1.015   (Normal Range: 1.003-1.035) Blood: negative   (Normal Range: Negative) pH: 7.0   (Normal Range: 5.0-8.0) Protein: 1+   (Normal Range: Negative) Urobilinogen: 1.0   (Normal Range: 0-1) Nitrite: positive   (Normal Range: Negative) Leukocyte Esterace: 1+   (Normal Range: Negative)    Comments: [name] [name]  September 23, 2008 10:37 AM

## 2010-09-13 NOTE — Progress Notes (Signed)
Summary: generic for crestor  Phone Note Call from Patient Call back at Home Phone [phone]   Caller: Patient Reason for Call: Privacy/Consent Authorization Summary of Call: Pt would like a generic for crestor? Initial call taken by: Romualdo Bolk, CMA (AAMA),  June 16, 2009 1:06 PM  Follow-up for Phone Call        there is no generic crestor . She can try a generic simvistatin that is not as powerful but could give some good effect.   If she wishes to change then   resceck lipids and lfts in 6-8 weeks after the change and then return office visit to reveiw results  Follow-up by: Madelin Headings MD,  June 16, 2009 1:20 PM  Additional Follow-up for Phone Call Additional follow up Details #1::        LMTOCB Additional Follow-up by: Romualdo Bolk, CMA (AAMA),  June 16, 2009 4:40 PM    Additional Follow-up for Phone Call Additional follow up Details #2::    Pt aware of this and wants to go on Simvastatin. Pt wants 90 [facility] to [facility].  Romualdo Bolk, CMA (AAMA)  June 19, 2009 4:53 PM   Additional Follow-up for Phone Call Additional follow up Details #3:: Details for Additional Follow-up Action Taken: simvistatin 40 mg 1 by mouth once daily  disp 90 and   follow up labs and rov as above  Additional Follow-up by: Madelin Headings MD,  June 20, 2009 8:33 AM  New/Updated Medications: SIMVASTATIN 40 MG TABS (SIMVASTATIN) 1 by mouth once daily Prescriptions: SIMVASTATIN 40 MG TABS (SIMVASTATIN) 1 by mouth once daily  #90 x 0   Entered by:   Romualdo Bolk, CMA (AAMA)   Authorized by:   Madelin Headings MD   Signed by:   Romualdo Bolk, CMA (AAMA) on 06/20/2009   Method used:   Faxed to ...       [facility]       [address] [place], [place]  [address]       Ph: [phone]       Fax: [phone]   RxID:   [id number]    Rx faxed electronically to pharmacy.  Pt will call back to schedule the lab and rov.  Romualdo Bolk, CMA (AAMA)  June 20, 2009 4:51 PM

## 2010-09-13 NOTE — Progress Notes (Signed)
Summary: Pt requesting call from [facility] re: referral  Phone Note Call from Patient Call back at Home Phone [phone]   Caller: Patient Call For: Panosh Summary of Call: Pt had labs and OV on 6/26 and brought in a self addressed envelope to have her labs sent to her.  Pt has not received a call with report or received her labs in the mail. Initial call taken by: Sid Falcon LPN,  February 18, 980 4:21 PM  Follow-up for Phone Call        Tell patient that that her labs are ok  except her hg a1c is borderline elevated   .  Thyroid test and vit d are ok. Sorry about the  delay but was out of town and had to find the copy of pathology from her  thyroid bx  which was ok as she said.    Stay on same meds and  ROV in 6 months  or as needed   Would like to  refer to ENT   to  get their opinion on her throat pressure.  Ok to mail her a copy of labs  and path. Follow-up by: Madelin Headings MD,  February 19, 2008 8:24 AM  Additional Follow-up for Phone Call Additional follow up Details #1::        Left message to call back and mailed pt a copy of her labs and path report. Additional Follow-up by: Romualdo Bolk, CMA,  February 19, 2008 10:39 AM    Additional Follow-up for Phone Call Additional follow up Details #2::    Pt called back, provided her with Dr Rosezella [place] message and pt had some questions about the referral.  Pt requesting a call back from [facility] LPN  February 19, 2008 11:26 AM Left message to call back. Romualdo Bolk, CMA  February 19, 2008 3:10 PM Spoke to pt aware and wants to go ahead with referral all questions answer. Pt wants to do ENT then if everything is okay would like to go to Dr. Horald Pollen. Order sent to [facility]. Romualdo Bolk, CMA  February 22, 2008 3:43 PM    Follow-up by: Romualdo Bolk, CMA,  February 22, 2008 3:43 PM       Appended Document: Orders Update     Clinical Lists Changes  Orders: Added new Referral order of ENT Referral (ENT) - Signed

## 2010-09-13 NOTE — Assessment & Plan Note (Signed)
Summary: follow up/ssc   Vital Signs:  Patient profile:   70 year old female Menstrual status:  hysterectomy Height:      65.75 inches Weight:      207 pounds BMI:     33.79 Pulse rate:   72 / minute BP sitting:   110 / 80  (left arm) Cuff size:   regular  Vitals Entered By: Romualdo Bolk, CMA (February 28, 2009 1:11 PM)  Serial Vital Signs/Assessments:  Time      Position  BP       Pulse  Resp  Temp     By                     118/78                         Madelin Headings MD  Comments: right arm sitting By: Madelin Headings MD   CC: Follow-up visit on blood pressure, Hypertension Management LMP - Character: Hyst 48 Menarche (age onset): 15 years   days  Menstrual Status hysterectomy   History of Present Illness: [name] [name] comes in today for yearly check for multiple medical problems .  HBP: Good  readings    no se . LIPIDs:  contniuing medications  and nose  GERD   doing ok except when  eats  wrong food.   off for a months. ENT   said had    acid causing  THyroid doing ok no change in meds . Cough  ? from pnd nose drainage and reflux . No cp sob or progression  . NOse spray helped a lot in the past.  Leg pain has been the most bothersome concerning problem.    Seeing Dr Venetia Maxon for leg pain    pain med made her drowsy  . Had MRI 2 years ago. Considered for injections. Pain keeps her from exercising as much aslo. No progression  Hypertension History:      She denies headache, chest pain, palpitations, dyspnea with exertion, orthopnea, PND, peripheral edema, visual symptoms, neurologic problems, syncope, and side effects from treatment.  She notes no problems with any antihypertensive medication side effects.        Positive major cardiovascular risk factors include female age 21 years old or older, hyperlipidemia, and hypertension.  Negative major cardiovascular risk factors include non-tobacco-user status.     Preventive Screening-Counseling & Management   Alcohol-Tobacco     Alcohol drinks/day: 0     Smoking Status: quit     Year Quit: 06/16/1984  Caffeine-Diet-Exercise     Caffeine use/day: 1-2     Does Patient Exercise: no  Current Medications (verified): 1)  Synthroid 100 Mcg  Tabs (Levothyroxine Sodium) .Marland Kitchen.. 1 By Mouth Once Daily 2)  Crestor 40 Mg Tabs (Rosuvastatin Calcium) .... One Once Daily 3)  Triamterene-Hctz 37.5-25 Mg Tabs (Triamterene-Hctz) .Marland Kitchen.. 1 By Mouth Once Daily 4)  Nexium 40 Mg  Cpdr (Esomeprazole Magnesium) 5)  Lyrica 50 Mg Caps (Pregabalin) .Marland Kitchen.. 1-2 Two Times A Day  Allergies (verified): 1)  Codeine Phosphate (Codeine Phosphate) 2)  Oxycodone Hcl (Oxycodone Hcl) 3)  Zestril (Lisinopril)  Past History:  Past medical, surgical, family and social histories (including risk factors) reviewed, and no changes noted (except as noted below).  Past Medical History: GERD Hyperlipidemia Hypertension Cancerous polyps, hx of Thyroid disease  evaluation for pulsatile tinnitus   MRA MPV normal  hyperglycemia  Abd pain eval  with neg ct [place] and mri of back  djd Stern Headache ENT  eval for cough and throat     Consults Dr. Stasia Cavalier   Past Surgical History: Reviewed history from 02/05/2008 and no changes required. Hysterectomy fibroids Lumbar sx   Roxan Hockey   neuro fibroma Left shoulder arthroscopy Left middle finger A1 pulley release Thyroidectomy-partial right Ballen Isthmusectomy Tonsillectomy Colon polypectomy  Past History:  Care Management: Neurology: Venetia Maxon Orthopedics: Gramig  Family History: Reviewed history from 02/05/2008 and no changes required. no change in fam hx  Family History of CAD Female 1st degree relative  father MI Family History Hypertension mom allergies  Social History: Reviewed history from 09/23/2008 and no changes required. Former Smoker Retired Alcohol use-no Drug use-no Regular exercise-no walks when she can Married husband dx with Multiple myeloma  . hh of 2  no  pets   husband now on dialysis Caffeine use/day:  1-2  Review of Systems  The patient denies anorexia, fever, weight loss, vision loss, decreased hearing, chest pain, syncope, hemoptysis, abdominal pain, melena, hematochezia, hematuria, transient blindness, difficulty walking, depression, unusual weight change, abnormal bleeding, enlarged lymph nodes, angioedema, and breast masses.         cough off an on   no sob   some drainage .    and congested .   post nasal drainage.  sometimes takes claritin. throat clearing.    right sided HAs at times with nose congestion  Physical Exam  General:  Well-developed,well-nourished,in no acute distress; alert,appropriate and cooperative throughout examination Head:  normocephalic and atraumatic.   Eyes:  vision grossly intact, pupils equal, and pupils round.   Ears:  R ear normal, L ear normal, and no external deformities.   Nose:  mild congestionno external deformity and no external erythema.   Mouth:  pharynx pink and moist.   Neck:  No deformities, masses, or tenderness noted.thyroid palpable  Lungs:  Normal respiratory effort, chest expands symmetrically. Lungs are clear to auscultation, no crackles or wheezes.normal breath sounds.   Heart:  Normal rate and regular rhythm. S1 and S2 normal without gallop, murmur, click, rub or other extra sounds.no lifts.   Abdomen:  Bowel sounds positive,abdomen soft and non-tender without masses, organomegaly or   noted. Msk:  no joint swelling, no joint warmth, and no redness over joints.   Pulses:  pulses intact without delay   Extremities:  no clubbing cyanosis or edema  Neurologic:  alert & oriented X3 and gait normal.   Skin:  turgor normal, color normal, no ecchymoses, no petechiae, and no purpura.   Cervical Nodes:  No lymphadenopathy noted Psych:  Normal eye contact, appropriate affect. Cognition appears normal.    Impression & Recommendations:  Problem # 1:  HYPERTENSION (ICD-401.9)  Her updated  medication list for this problem includes:    Triamterene-hctz 37.5-25 Mg Tabs (Triamterene-hctz) .Marland Kitchen... 1 by mouth once daily  Orders: Venipuncture (16109) TLB-BMP (Basic Metabolic Panel-BMET) (80048-METABOL)  BP today: 110/80 Prior BP: 120/80 (09/23/2008)  10 Yr Risk Heart Disease: 8 %  Labs Reviewed: K+: 3.9 (02/05/2008) Creat: : 0.8 (02/05/2008)   Chol: 140 (02/05/2008)   HDL: 47.1 (02/05/2008)   LDL: 63 (02/05/2008)   TG: 149 (02/05/2008)  Problem # 2:  HYPERLIPIDEMIA (ICD-272.4)  Her updated medication list for this problem includes:    Crestor 40 Mg Tabs (Rosuvastatin calcium) ..... One once daily  Orders: Venipuncture (60454) TLB-Lipid Panel (80061-LIPID)  Labs Reviewed: SGOT: 28 (02/05/2008)   SGPT: 19 (02/05/2008)  10  Yr Risk Heart Disease: 8 %   HDL:47.1 (02/05/2008), 47.9 (01/19/2007)  LDL:63 (02/05/2008), DEL (54/04/8118)  Chol:140 (02/05/2008), 226 (01/19/2007)  Trig:149 (02/05/2008), 186 (01/19/2007)  Problem # 3:  RHINITIS (ICD-477.9)  response to a steroid spray in the past  ? which one  will restart a generic to try. Her updated medication list for this problem includes:    Flonase 50 Mcg/act Susp (Fluticasone propionate) .Marland Kitchen... 2 sprays each nares q d for rhinitis  Orders: TLB-B12 + Folate Pnl (14782_95621-H08/MVH) TLB-CBC Platelet - w/Differential (85025-CBCD)  Problem # 4:  COUGH (ICD-786.2) off and on felt from PND and rhinitis  and ELR  last c xray was NAD 2/09  Problem # 5:  FASTING HYPERGLYCEMIA (ICD-790.29)  Orders: Venipuncture (84696) TLB-B12 + Folate Pnl (29528_41324-M01/UUV) TLB-Hepatic/Liver Function Pnl (80076-HEPATIC) TLB-A1C / Hgb A1C (Glycohemoglobin) (83036-A1C)  Labs Reviewed: Creat: 0.8 (02/05/2008)     Problem # 6:  LEG PAIN (ICD-729.5) radicular   leg pain   ?  followed by NS    to return for  management  Problem # 7:  GERD (ICD-530.81) / if getting rebound acid with off and on nexium   try prilosec otc for now and  consier other options in the future  . since she has elr and atypical findings she may not respond to h2 antag but could try in the future. Her updated medication list for this problem includes:    Nexium 40 Mg Cpdr (Esomeprazole magnesium)  Complete Medication List: 1)  Synthroid 100 Mcg Tabs (Levothyroxine sodium) .Marland Kitchen.. 1 by mouth once daily 2)  Crestor 40 Mg Tabs (Rosuvastatin calcium) .... One once daily 3)  Triamterene-hctz 37.5-25 Mg Tabs (Triamterene-hctz) .Marland Kitchen.. 1 by mouth once daily 4)  Nexium 40 Mg Cpdr (Esomeprazole magnesium) 5)  Lyrica 50 Mg Caps (Pregabalin) .Marland Kitchen.. 1-2 two times a day 6)  Flonase 50 Mcg/act Susp (Fluticasone propionate) .... 2 sprays each nares q d for rhinitis  Other Orders: TLB-TSH (Thyroid Stimulating Hormone) (84443-TSH)  Hypertension Assessment/Plan:      The patient's hypertensive risk group is category B: At least one risk factor (excluding diabetes) with no target organ damage.  Her calculated 10 year risk of coronary heart disease is 8 %.  Today's blood pressure is 110/80.  Her blood pressure goal is < 140/90.  Patient Instructions: 1)  Try otc prilosec.  instead    of nexium.  2)  You will be informed of lab results when available.  3)  start flonase    every day   to see effect on congestion and coughing  most effective to use every day.   4)  You will be informed of lab results when available. and then plan follow up  Prescriptions: FLONASE 50 MCG/ACT SUSP (FLUTICASONE PROPIONATE) 2 sprays each nares q d for rhinitis  #1 x prn   Entered and Authorized by:   Madelin Headings MD   Signed by:   Madelin Headings MD on 02/28/2009   Method used:   [facility]        [facility] [phone]* (retail)       [address]       [place], [place]  [address]       Ph: [phone] or [phone]       Fax: [phone]   RxID:   [id number]

## 2010-09-13 NOTE — Progress Notes (Signed)
Summary: refill  Phone Note Call from Patient Call back at Home Phone [phone]   Caller: Patient Summary of Call: Pt needs a refill on simvastatin  Initial call taken by: Romualdo Bolk, CMA Duncan Dull),  Jan 09, 2010 5:07 PM  Follow-up for Phone Call        Pt aware that she is due to a follow up in Aug. Rx sent to pharmacy. Follow-up by: Romualdo Bolk, CMA (AAMA),  Jan 09, 2010 5:09 PM    Prescriptions: SIMVASTATIN 40 MG TABS (SIMVASTATIN) 1 by mouth once daily  #90 x 0   Entered by:   Romualdo Bolk, CMA (AAMA)   Authorized by:   Madelin Headings MD   Signed by:   Romualdo Bolk, CMA (AAMA) on 01/09/2010   Method used:   Faxed to ...       [facility]       [address] [place], [place]  [address]       Ph: [phone]       Fax: [phone]   RxID:   [id number]

## 2010-09-13 NOTE — Procedures (Signed)
Summary: Gastroenterology  Gastroenterology   Imported By: Donneta Romberg 10/19/2007 09:23:19  _____________________________________________________________________  External Attachment:    Type:   Image     Comment:   External Document

## 2010-09-13 NOTE — Progress Notes (Signed)
Summary: REFILL REQUEST  Phone Note Refill Request Message from:  Fax from Pharmacy on January 11, 2010 8:21 AM  Refills Requested: Medication #1:  TRIAMTERENE-HCTZ 37.5-25 MG TABS 1 by mouth once daily   [facility] [address], [employer].Marland Kitchen..90-day request.    Initial call taken by: Debbra Riding,  January 11, 2010 8:23 AM  Follow-up for Phone Call        Rx sent to pharmacy. Follow-up by: Romualdo Bolk, CMA (AAMA),  January 11, 2010 9:20 AM    Prescriptions: TRIAMTERENE-HCTZ 37.5-25 MG TABS (TRIAMTERENE-HCTZ) 1 by mouth once daily  #90 x 0   Entered by:   Romualdo Bolk, CMA (AAMA)   Authorized by:   Madelin Headings MD   Signed by:   Romualdo Bolk, CMA (AAMA) on 01/11/2010   Method used:   Electronically to        [facility] [phone]* (retail)       [address] [place]  [address]       Ph: [phone] or [phone]       Fax: [phone]   RxID:   [id number] TRIAMTERENE-HCTZ 37.5-25 MG TABS (TRIAMTERENE-HCTZ) 1 by mouth once daily  #30 Tablet x 2   Entered by:   Romualdo Bolk, CMA (AAMA)   Authorized by:   Madelin Headings MD   Signed by:   Romualdo Bolk, CMA (AAMA) on 01/11/2010   Method used:   [facility]ronically to        [facility] [phone]* (retail)       [address]       [place], [place]  [address]       Ph: [phone] or [phone]       Fax: [phone]   RxID:   [id number]  Resent for 90 days instead of 30 days. Romualdo Bolk, CMA (AAMA)  January 11, 2010 9:20 AM

## 2010-09-13 NOTE — Procedures (Signed)
Summary: egd  egd   Imported By: Kassie Mends 08/07/2007 08:38:27  _____________________________________________________________________  External Attachment:    Type:   Image     Comment:   egd

## 2010-09-13 NOTE — Consult Note (Signed)
Summary: [facility]  [facility]   Imported By: Maryln Gottron 04/06/2008 14:41:00  _____________________________________________________________________  External Attachment:    Type:   Image     Comment:   External Document

## 2010-09-29 ENCOUNTER — Other Ambulatory Visit: Payer: Self-pay | Admitting: Internal Medicine

## 2010-10-18 ENCOUNTER — Ambulatory Visit (INDEPENDENT_AMBULATORY_CARE_PROVIDER_SITE_OTHER): Payer: Medicare Other | Admitting: Internal Medicine

## 2010-10-18 ENCOUNTER — Ambulatory Visit (INDEPENDENT_AMBULATORY_CARE_PROVIDER_SITE_OTHER)
Admission: RE | Admit: 2010-10-18 | Discharge: 2010-10-18 | Disposition: A | Discharge status: Home / Self Care | Payer: Medicare Other | Source: Ambulatory Visit | Attending: Internal Medicine | Admitting: Internal Medicine

## 2010-10-18 ENCOUNTER — Encounter: Payer: Self-pay | Admitting: Internal Medicine

## 2010-10-18 VITALS — BP 100/60 | HR 63 | Temp 98.9°F | Wt 213.0 lb

## 2010-10-18 DIAGNOSIS — I10 Essential (primary) hypertension: Secondary | ICD-10-CM

## 2010-10-18 DIAGNOSIS — R12 Heartburn: Secondary | ICD-10-CM

## 2010-10-18 DIAGNOSIS — R0609 Other forms of dyspnea: Secondary | ICD-10-CM

## 2010-10-18 DIAGNOSIS — R0600 Dyspnea, unspecified: Secondary | ICD-10-CM

## 2010-10-18 DIAGNOSIS — R079 Chest pain, unspecified: Secondary | ICD-10-CM

## 2010-10-18 DIAGNOSIS — R0989 Other specified symptoms and signs involving the circulatory and respiratory systems: Secondary | ICD-10-CM

## 2010-10-18 DIAGNOSIS — R071 Chest pain on breathing: Secondary | ICD-10-CM

## 2010-10-18 LAB — CBC WITH DIFFERENTIAL/PLATELET
Basophils Absolute: 0 10*3/uL (ref 0.0–0.1)
Basophils Relative: 0.4 % (ref 0.0–3.0)
Eosinophils Absolute: 0.1 10*3/uL (ref 0.0–0.7)
Eosinophils Relative: 1.9 % (ref 0.0–5.0)
HCT: 38.7 % (ref 36.0–46.0)
Hemoglobin: 12.8 g/dL (ref 12.0–15.0)
Lymphocytes Relative: 38.5 % (ref 12.0–46.0)
Lymphs Abs: 2.3 10*3/uL (ref 0.7–4.0)
MCHC: 33.1 g/dL (ref 30.0–36.0)
MCV: 89.8 fl (ref 78.0–100.0)
Monocytes Absolute: 0.5 10*3/uL (ref 0.1–1.0)
Monocytes Relative: 8.4 % (ref 3.0–12.0)
Neutro Abs: 3 10*3/uL (ref 1.4–7.7)
Neutrophils Relative %: 50.8 % (ref 43.0–77.0)
Platelets: 186 10*3/uL (ref 150.0–400.0)
RBC: 4.31 Mil/uL (ref 3.87–5.11)
RDW: 13.6 % (ref 11.5–14.6)
WBC: 5.9 10*3/uL (ref 4.5–10.5)

## 2010-10-18 LAB — BASIC METABOLIC PANEL
BUN: 18 mg/dL (ref 6–23)
CO2: 29 mEq/L (ref 19–32)
Calcium: 9.4 mg/dL (ref 8.4–10.5)
Chloride: 103 mEq/L (ref 96–112)
Creatinine, Ser: 0.8 mg/dL (ref 0.4–1.2)
GFR: 98.6 mL/min (ref >60.00)
Glucose, Bld: 70 mg/dL (ref 70–99)
Potassium: 3.8 mEq/L (ref 3.5–5.1)
Sodium: 143 mEq/L (ref 135–145)

## 2010-10-18 LAB — TSH: TSH: 0.67 u[IU]/mL (ref 0.35–5.50)

## 2010-10-18 NOTE — Assessment & Plan Note (Signed)
Unclear reason.Marland Kitchen describes it as on exertion but no wheezing  Has chronic cough and throat clearing .   Takes  rolaids for this. No hx of asthma but consider rad  .  Get cxray  labs d dimer ro anemia and thyroid.  Her ekg is nl today .  Consider further eval depending on  Response and  Course of sx complex.

## 2010-10-18 NOTE — Assessment & Plan Note (Signed)
Hx of HH and gerd  But not on meds now.   Some choking when lays down.  Needs to have a trial of ppi.

## 2010-10-18 NOTE — Progress Notes (Signed)
  Subjective:    Patient ID: [name] [name], female    DOB: 02-17-1941, 70 y.o.   MRN: [id number]  HPI Comes in today for acute problem . Describes about 10 days of  Problem onset with sharp pain between shoulder blades that hurt with motion and twisting and then "sob "  Soreness on anterior chest bone area.  Cough  ? If worse  Throat clearing contiues .gets HB and takes rolaids . Not on  gerd meds.   Chest soreness not related to exercise but tender to touch and move. No edema but says right arm was more swollen  than usual last week. No injury or fall. Past Medical History  Diagnosis Date  . GERD (gastroesophageal reflux disease)   . Hypertension   . Hyperlipidemia   . Thyroid disease   . History of colon polyps   . Pulsatile tinnitus     had MRA and MPV nl mild carotid dopplers2010  . Hyperglycemia   . Headache   . Cough     ent evaluation   Past Surgical History  Procedure Date  . Abdominal hysterectomy   . Lumbar spine surgery      stern  . Shoulder arthroscopy     left  . Thyroidectomy     partial rt  . Isthmuectomy   . Tonsillectomy   . Polypectomy     colon    reports that she has quit smoking. She does not have any smokeless tobacco history on file. She reports that she does not drink alcohol or use illicit drugs. family history includes Alzheimer's disease in her mother; Asthma in her sister; Colon cancer in her sister; and Heart attack in her brother and father. Allergies  Allergen Reactions  . Codeine Phosphate     REACTION: unspecified  . Lisinopril     REACTION: COUGH  . Oxycodone Hcl     REACTION: rash     Review of Systems No fever weight loss bleeding bruising sweats NVD. No wheezing  Last pfts were years ago.     Objective:   Physical Exam WDWN in nad nl speech and no breathlessness. No cough .Good color. HEENT: Normocephalic ;atraumatic , Eyes;  PERRL, EOMs  Full, lids and conjunctiva clear,,Ears: no deformities, canals nl, TM landmarks normal,  Nose: no deformity or discharge  Mouth : OP clear without lesion or edema . Has lots of throat clearing. Neck no bruit heard. Thyroid palpable.No LNadenopathy. Chest:  Clear to A&P without wheezes rales or rhonchi    Chest wall tenderness left parasternal cc area to palpation  No crepitus . No masses. CV:  S1-S2 no gallops or murmurs peripheral perfusion is normal  No clubbing cyanosis or edema although right hand a bit more swollen than left Abdomen:  Sof,t normal bowel sounds without hepatosplenomegaly, no guarding rebound or masses no CVA tenderness Neuro: oriented x 2 .  Non focal grossly.  EKG NSR no acute changes       Assessment & Plan:  SOB  Unclear cause   CP anterior constant and tender to touch  Atypical Cough ? acute on chronic ? GERD  ? How much contributing to picture.  Add prilosec every day and undergo evaluation.   Expectant management.

## 2010-10-18 NOTE — Patient Instructions (Signed)
Begin prilosec  Every day before a meal Will notify you  of labs when available.  return office visit in 1-2 weeks or as needed  Your ekg and oxygen level is normal today.

## 2010-10-19 ENCOUNTER — Encounter: Payer: Self-pay | Admitting: *Deleted

## 2010-10-19 LAB — HIGH SENSITIVITY CRP: CRP, High Sensitivity: 10.28 mg/L — ABNORMAL HIGH (ref 0.00–5.00)

## 2010-10-19 LAB — D-DIMER, QUANTITATIVE: D-Dimer, Quant: 0.42 ug/mL-FEU (ref 0.00–0.48)

## 2010-10-23 ENCOUNTER — Telehealth: Payer: Self-pay | Admitting: *Deleted

## 2010-10-23 DIAGNOSIS — R0600 Dyspnea, unspecified: Secondary | ICD-10-CM

## 2010-10-23 NOTE — Telephone Encounter (Signed)
Pt aware of results 

## 2010-10-23 NOTE — Telephone Encounter (Signed)
Message copied by Tor Netters on Tue Oct 23, 2010 11:53 AM ------      Message from: [facility]      Created: Fri Oct 19, 2010 11:56 AM       Tell patient that the labs are good and neg screen for blood clot.      Take the  Reflux medication and refer to pulmonary for  pfts and consult.  Dx Dyspnea Keep her appt with me also.

## 2010-11-01 ENCOUNTER — Ambulatory Visit: Payer: Medicare Other | Admitting: Internal Medicine

## 2010-11-20 ENCOUNTER — Encounter: Payer: Self-pay | Admitting: Pulmonary Disease

## 2010-11-22 ENCOUNTER — Ambulatory Visit (INDEPENDENT_AMBULATORY_CARE_PROVIDER_SITE_OTHER): Payer: Medicare Other | Admitting: Pulmonary Disease

## 2010-11-22 ENCOUNTER — Encounter: Payer: Self-pay | Admitting: Pulmonary Disease

## 2010-11-22 VITALS — BP 124/72 | HR 74 | Temp 98.8°F | Ht 66.0 in | Wt 217.0 lb

## 2010-11-22 DIAGNOSIS — R0609 Other forms of dyspnea: Secondary | ICD-10-CM

## 2010-11-22 DIAGNOSIS — R05 Cough: Secondary | ICD-10-CM

## 2010-11-22 DIAGNOSIS — R0989 Other specified symptoms and signs involving the circulatory and respiratory systems: Secondary | ICD-10-CM

## 2010-11-22 DIAGNOSIS — R079 Chest pain, unspecified: Secondary | ICD-10-CM

## 2010-11-22 DIAGNOSIS — R0600 Dyspnea, unspecified: Secondary | ICD-10-CM

## 2010-11-22 DIAGNOSIS — R059 Cough, unspecified: Secondary | ICD-10-CM

## 2010-11-22 LAB — PULMONARY FUNCTION TEST

## 2010-11-22 NOTE — Progress Notes (Signed)
  Subjective:    Patient ID: [name] [name], female    DOB: [birth year], 70 y.o.   MRN: [id number]  HPI The pt is a 69y/o female who I have been asked to see for dyspnea.  She has noticed worsening sob for about 6wks, and prior to this feels she was very active with very little symptoms.  She has noticed sob getting out of shower, vacuuming, getting dressed, and bringing groceries in from the car.  However, she can do her daily long distance walk at the mall each am and not have any significant sob.  She has noted that her weight is up about 15 pounds in the last few months, but neutral over the last one year.  She has no h/o asthma, and has not had a recent cardiac evaluation.  She does have a cough that is classic for upper airway origin by her history, with tickle and throat clearing.  She does have significant reflux symptoms, but has not been compliant with her PPI.  She did have pfts today that show no airflow obstruction by spirometry, but there is airtrapping on lung volumes.  DLCO is mildly reduced, but corrects with Av.    Review of Systems  Constitutional: Positive for unexpected weight change. Negative for fever.  HENT: Positive for sneezing. Negative for ear pain, nosebleeds, congestion, sore throat, rhinorrhea, trouble swallowing, dental problem, postnasal drip and sinus pressure.   Eyes: Negative for redness and itching.  Respiratory: Positive for cough and shortness of breath. Negative for chest tightness and wheezing.   Cardiovascular: Negative for palpitations and leg swelling.  Gastrointestinal: Positive for abdominal pain. Negative for nausea and vomiting.  Genitourinary: Negative for dysuria.  Musculoskeletal: Positive for joint swelling.  Skin: Negative for rash.  Neurological: Positive for headaches.  Hematological: Does not bruise/bleed easily.  Psychiatric/Behavioral: Negative for dysphoric mood. The patient is not nervous/anxious.        Objective:   Physical  Exam Constitutional:  obese, no acute distress  HENT:  Nares patent without discharge, but turbinate hypertrophy  Oropharynx without exudate, palate and uvula are normal  Eyes:  Perrla, eomi, no scleral icterus  Neck:  No JVD, no TMG  Cardiovascular:  Normal rate, regular rhythm, no rubs or gallops. 2/6 sem        Intact distal pulses  Pulmonary :  Normal breath sounds, no stridor or respiratory distress   No rales, rhonchi, or wheezing  Abdominal:  Soft, nondistended, bowel sounds present.  No tenderness noted.   Musculoskeletal:  No lower extremity edema noted.  Lymph Nodes:  No cervical lymphadenopathy noted  Skin:  No cyanosis noted  Neurologic:  Alert, appropriate, moves all 4 extremities without obvious deficit.         Assessment & Plan:

## 2010-11-22 NOTE — Assessment & Plan Note (Signed)
The pt has doe primarily while doing activities that involve her rushing, but does not occur with a lengthy walk around the mall at a measured pace.  She believes her symptoms have only been present for about 6weeks, but I wonder if it is actually longer.  She has gained back about 15 pounds over the last few mos, and perhaps this is contributing.  Her lungs are clear today, and recent cxr unimpressive.  Her pfts do not show obstruction on spirometry, but her lung volumes do suggest airtrapping.  She denies cardiac issues, but has not had a recent evaluation.  At this point, it is unclear if her symptoms are due to her weight gain or possibly subjective.  She does have airtrapping on her pfts, and therefore will give her a trial of foradil.  I have also asked her to get back on her PPI given her dry upper airway cough with reflux symptoms.

## 2010-11-22 NOTE — Patient Instructions (Signed)
Get back on prilosec, and take 2 capsules every am before breakfast until next visit with me Trial of foradil one inhalation am and pm until next visit Work on weight loss.  followup with me in 3 weeks.

## 2010-11-22 NOTE — Progress Notes (Signed)
PFT done today. 

## 2010-12-10 ENCOUNTER — Encounter: Payer: Self-pay | Admitting: Pulmonary Disease

## 2010-12-10 ENCOUNTER — Other Ambulatory Visit: Payer: Self-pay | Admitting: Obstetrics and Gynecology

## 2010-12-10 DIAGNOSIS — Z1231 Encounter for screening mammogram for malignant neoplasm of breast: Secondary | ICD-10-CM

## 2010-12-13 ENCOUNTER — Ambulatory Visit (INDEPENDENT_AMBULATORY_CARE_PROVIDER_SITE_OTHER): Payer: Medicare Other | Admitting: Pulmonary Disease

## 2010-12-13 ENCOUNTER — Encounter: Payer: Self-pay | Admitting: Pulmonary Disease

## 2010-12-13 DIAGNOSIS — R05 Cough: Secondary | ICD-10-CM

## 2010-12-13 DIAGNOSIS — R059 Cough, unspecified: Secondary | ICD-10-CM

## 2010-12-13 DIAGNOSIS — R0609 Other forms of dyspnea: Secondary | ICD-10-CM

## 2010-12-13 DIAGNOSIS — R0600 Dyspnea, unspecified: Secondary | ICD-10-CM

## 2010-12-13 DIAGNOSIS — R0989 Other specified symptoms and signs involving the circulatory and respiratory systems: Secondary | ICD-10-CM

## 2010-12-13 MED ORDER — FORMOTEROL FUMARATE 12 MCG IN CAPS: 12.0000 ug | ORAL_CAPSULE | Freq: Two times a day (BID) | RESPIRATORY_TRACT | Status: DC

## 2010-12-13 NOTE — Patient Instructions (Signed)
Stay on foradil for next few months to see if helps breathing. Work on weight loss and conditioning Take your nasal spray every morning until hoarseness and upper airway cough is better Try chlorpheniramine 8mg  at bedtime for the next few weeks to see if cough and sinus pressure improves Stay on prilosec 2 a day for your reflux followup with me in 2mos

## 2010-12-13 NOTE — Progress Notes (Signed)
  Subjective:    Patient ID: [name] [name], female    DOB: 15-Nov-1940, 70 y.o.   MRN: [id number]  HPI The pt comes in today for f/u of her cough and doe.  At the last visit, she was felt to have an upper airway cough due to PND and LPR, and her doe was felt primarily due to obesity and deconditioning.  She did have pfts that showed no obstruction on spirometry, but her lung volumes did show subtle airtrapping.  She was started on a trial of foradil, and feels it may have helped her breathing.  She has a lot of polysomatic complaints, and it is difficult from her history what has helped and what hasn't.  She would like to stay on foradil for awhile longer.  Her cough is better, but she is still is having throat clearing and hoarseness.  She is not taking her ppi as directed last visit (c/o some abd discomfort),and is not using her nasal ICS compliantly.  She does feel she has postnasal drip due to allergies.    Review of Systems  Constitutional: Negative for fever and unexpected weight change.  HENT: Positive for congestion, sore throat and sinus pressure. Negative for ear pain, nosebleeds, rhinorrhea, sneezing, trouble swallowing, dental problem and postnasal drip.   Eyes: Negative for redness and itching.  Respiratory: Positive for cough. Negative for chest tightness, shortness of breath and wheezing.   Cardiovascular: Negative for palpitations and leg swelling.  Gastrointestinal: Negative for nausea and vomiting.  Genitourinary: Negative for dysuria.  Musculoskeletal: Negative for joint swelling.  Skin: Negative for rash.  Neurological: Positive for headaches.  Hematological: Does not bruise/bleed easily.  Psychiatric/Behavioral: Negative for dysphoric mood. The patient is not nervous/anxious.        Objective:   Physical Exam Obese female in nad Nares without discharge or purulence noted.  Chest totally clear to auscultation, no wheezing Cor with rrr, no mrg LE without edema, no  cyanosis  Alert and oriented, moves all 4        Assessment & Plan:

## 2010-12-17 ENCOUNTER — Encounter: Payer: Self-pay | Admitting: Pulmonary Disease

## 2010-12-17 NOTE — Assessment & Plan Note (Signed)
I suspect the pt has very subtle COPD that is due to her prior h/o smoking.  Her hx is not suggestive of asthma, but will keep in mind.  She thinks the foradil may have helped to some degree, but has not taken compliantly.  I have asked her to take twice a day religiously for the next few mos to see if it really helps her symptoms or not.

## 2010-12-17 NOTE — Assessment & Plan Note (Signed)
I continue to believe her cough is upper airway in origin.  She has not stayed on meds cmpliantly for PND and LPR.  I have asked her to commit for a period of 2 weeks to daily use of nasal ICS and antihistamine, as well as her higher dose PPI.  I have also reviewed the behavioral techniques to limit throat clearing and upper airway irritation. Marland Kitchen

## 2010-12-25 NOTE — Assessment & Plan Note (Signed)
[facility]                         GASTROENTEROLOGY OFFICE NOTE   NAME:Fortson, Briele                      MRN:          [id number]  DATE:05/27/2007                            DOB:          [birth year]    CHIEF COMPLAINT/PROBLEMS:  1. Reflux.  2. Irritable bowel syndrome.  3. History of colon polyps.   Ms. Cid' heartburn is much better, though tomato-based sauces and  things like that still bother her.  She is on Nexium b.i.d.  Carafate  has been discontinued.  Water is generally okay, but even that bothers  her sometimes, certain juices and sodas do.  She is having bloating and  alternating bowel habits at times, mainly formed, but that bothers her  some, and is a chronic recurrent problem.  She is not really eating much  fiber.   SOCIAL HISTORY:  Her husband is going to see an oncologist for unclear  reasons at this time.   PROBLEMS:  See previous dictation of April 16, 2007.   MEDICATIONS:  Listed and reviewed in the chart.   ALLERGIES:  Listed and reviewed in the chart.   PHYSICAL EXAMINATION:  Weight 215 pounds.  Height 5 feet 6 inches.  BMI  34.7, which is obesity range.  Pulse 80.  Blood pressure 110/68.   ASSESSMENT:  1. Gastroesophageal reflux disease, improved.  Still has some      intolerance to certain foods.  2. Irritable bowel syndrome.  3. Personal history of colon polyps.   PLAN:  1. I think because she is still having some persistent symptoms, we      will have her do an EGD regarding her symptomatic GERD.  2. Schedule surveillance colonoscopy, which she is due for.  3. High-fiber diet is discussed.  Handout will be given.  4. Trial of Align 1 each day.  Probiotics are explained and discussed.  5. Regarding her BMI and obesity, weight loss would help her in many      ways, even if she lost 10 pounds.  I advised her to try to at least      get under 200 over the next 1 to 2 years.  Caloric restriction is  emphasized.  Her top target weight according to the scale would be      154 or 155 pounds.  I think that is probably too restrictive, but      weight loss is emphasized.   Twenty five minutes time spent with the patient, over half of which was  in counseling and coordination of care.     Iva Boop, MD,FACG  Electronically Signed    CEG/MedQ  DD: 05/27/2007  DT: 05/27/2007  Job #: [id number]

## 2010-12-25 NOTE — Assessment & Plan Note (Signed)
[facility]                         GASTROENTEROLOGY OFFICE NOTE   NAME:[name] [name], [name] [name]                      MRN:          [id number]  DATE:04/16/2007                            DOB:          1941-06-19    CHIEF COMPLAINT:  Reflux, chest pain.   Ms. Schewe had been seen by Mike Gip, PA-C for a flare of her  heartburn.  Actually had seen Dr. Tawanna Cooler in place of Dr. Fabian Sharp at the end  of June, early July was told to take Nexium twice a day for an extra 5-  10 days, did so, then had recurrent heartburn problems.  At the time she  saw Mike Gip on August 13, she was prescribed Nexium b.i.d. which  has helped but was also told to take Carafate.  She was supposed to take  that for about 10 days.  She continued on that but she noted a bubble  and pressure in her chest associated with dyspnea with this.  She had  called [place] back about this and because of the chest pain, it was  recommended to do to the ER where she saw Dr. Verdis Prime and had her  cardiac cath which she tells me was negative as well as a chest x-ray.  So, she went back on the Carafate again today and had the same problem  this morning.  Her heartburn seems to be better.   REVIEW OF SYSTEMS:  Otherwise unremarkable.  She has had some stress  lately with the anniversary of her mother's death early in the summer  and her 69 year old brother had sudden death.  Weight is stable.   MEDICATIONS:  Listed and reviewed in the chart.  She is on an aspirin a  day.  She had been using some Aleve for hip pain before and that seemed  perhaps to be a flare of this reflux symptomatology.   PAST MEDICAL HISTORY:  1. Gastroesophageal reflux disease, last EGD 2003 (Dr. Victorino Dike).  2. Colon polyps, last colonoscopy - December 2004, with small fragment      of tubulovillous adenoma with a high grade dysplasia.  3. Hiatal hernia.  4. Hypothyroidism on Synthroid.  5. Dyslipidemia.  6. Intolerant of  codeine.  7. Intolerant of oxycodone.  8. Intolerant of Zestril (cough).  9. Now intolerant of Carafate.  10.Hypertension.  11.Benign thyroid nodule.  12.Irritable bowel syndrome.  13.Prior right thyroid lobectomy and isthmusectomy.  14.Benign tumor AL spine.  15.Probable segmental ischemic colitis versus post polypectomy      syndrome after 2-cm ascending colon polyp removed (November 2003).  16.Chest x-ray on April 14, 2007, showing calcified granuloma in      the right lung, calcified splenic cyst.   PHYSICAL EXAMINATION:  GENERAL:  She is in no acute distress.  She is  overweight to obese.  VITAL SIGNS:  Her weight is 215 pounds, pulse 84, blood pressure 102/64.  LUNGS:  Clear.  HEART:  S1 S2.  No murmurs or gallops.  ABDOMEN:  Soft.  She has some diffuse nonspecific tenderness that is not  completely reproducible.  It is benign  overall.  Bowel sounds are  present.  NEUROLOGIC:  She is alert and oriented x3.  She does not appear anxious.   ASSESSMENT:  1. Gastroesophageal reflux disease with flare, perhaps NSAID induced.  2. Chest pain, pressure.  It sounds like it is from Carafate.  I think      she is intolerant of that.  3. Personal history of adenomatous villous colon polyps due for      followup soon.  4. Situational psychosocial stressors.   PLAN:  She is going to stay on the Nexium b.i.d. and stay off of the  Carafate.  As long as she does okay, I will see her back in 4-6 weeks,  sooner if needed.  At that point, we will decide whether or not an EGD  is really necessary and make plans to schedule her surveillance  colonoscopy as well.     Iva Boop, MD,FACG  Electronically Signed    CEG/MedQ  DD: 04/16/2007  DT: 04/16/2007  Job #: [id number]   cc:   Lyn Records, M.D.  Neta Mends. Fabian Sharp, MD

## 2010-12-25 NOTE — Assessment & Plan Note (Signed)
[facility]                         GASTROENTEROLOGY OFFICE NOTE   NAME:[name], [name]                      MRN:          [id number]  DATE:08/24/2007                            DOB:          May 05, 1941    CHIEF COMPLAINT:  Followup after colonoscopy and endoscopy.   She had gastritis.  I put her on Nexium before breakfast, and she says  that has helped a lot.  Her symptoms are much better.  She has decreased  and almost eliminated coffee and caffeine.  There is an occasional use  of Nexium in the evening when she has some indigestion-type symptoms.  She has lost 6 pounds going on a lower-carbohydrate and brown food diet  (i.e., no white starches).  She is also fasting through her church.  She  feels better overall.  Her colon polyps were benign and were reviewed  with the patient, i.e., adenomas.   MEDICATIONS:  Listed and reviewed in the chart.   PAST MEDICAL HISTORY:  Unchanged.  Will add adenomatous colon polyps and  gastritis to that.   REVIEW OF SYSTEMS:  She has been a little sore in her upper torso area.  She has had a cough and some mild drainage.  She is concerned about  that.  Her sister got pneumonia recently.   PHYSICAL EXAMINATION:  VITAL SIGNS;  She is obese, weight 209 pounds,  pulse 80, blood pressure 128/74.  LUNGS:  Clear.  HEENT:  Pharynx shows mild cobblestoning.  HEART:  S1, S2.  No murmurs, rubs, or gallops.   ASSESSMENT:  1. Gastritis, improved.  2. Colon polyps, adenomatous, status post removal.  3. Irritable bowel syndrome.  4. What sounds like probably a viral upper respiratory infection that      is improving.   NOTE:  She was raising concerns about the possibility of adhesions  causing her abdominal pain, since I indicated on her colonoscopy report  that her rectum and rectosigmoid area were somewhat fixed, and I  wondered if she did not have adhesions from her hysterectomy.  She says  Dr. Pennie Rushing had  performed lysis of adhesions in the past, but this time  she had gone back to Dr. Corinda Gubler to rule out any other problems prior to  any laparoscopy.  I indicated that a laparoscopy could be done, but  given the negative CT scan earlier in 2008, and these findings and the  risk of repeated adhesions with repeated surgical interventions, that  may not be in her best interest.  However, I do think it makes sense for  her to discuss this with Dr. Pennie Rushing.  We can see her back as needed.  Otherwise, she will continue her current medication regimen.  I have  recommended she use Prilosec OTC for breakthrough indigestion,  dyspepsia, and gastritis  symptoms in the evening so that she does not run out of her daily  Nexium.  She will continue to take that before breakfast.     Iva Boop, MD,FACG  Electronically Signed    CEG/MedQ  DD: 08/24/2007  DT: 08/25/2007  Job #: [id number]  cc:   Hal Morales, M.D.  Lyn Records, M.D.  Neta Mends. Fabian Sharp, MD

## 2010-12-25 NOTE — Assessment & Plan Note (Signed)
[name]                         GASTROENTEROLOGY OFFICE NOTE   NAME:[name] [name]                      MRN:          [id number]  DATE:03/25/2007                            DOB:          29-Jun-1941    CHIEF COMPLAINT:  Increasing heartburn and chest soreness.   HISTORY:  [name] [name] is a pleasant 70 year old African-American female  known to Dr. Victorino Dike who has a history of chronic GERD and IBS, as  well as colon polyps.  Her last colonoscopy was done in December of  2004.  She did have 1 small polyp removed, and path on this was  consistent with a fragment of a tubovillous adenoma with high-grade  glandular dysplasia.  Last endoscopy was done in October of 2003.  She  has had moderate chronic esophagitis and a 2-cm hiatal hernia.  CLOtest  was positive and she was treated.  She is maintained chronically on  Nexium once a day, and says that generally this works very well.  She  had been taking Aleve on a fairly regular basis for her arthritic  symptoms in her back and pelvis, etc.  She stopped this about 2-3 weeks  ago when her esophagus started bothering her.  She has had increase in  heartburn symptoms over the past month.  She denies any dysphagia or  odynophagia.  She has bumped up her Nexium to twice daily over the past  few days, but has not noticed much change as yet.  She has been using  Tums and taking Pepto-Bismol in addition to the Nexium.  She says she  feels a fullness or knot-type of sensation in her chest, and a burning  feeling.  She is not using much caffeine, perhaps 1 cup per day, has not  been on any recent antibiotics, no other new medicines other than the  Aleve which she had been taking.  She says she feels somewhat more short  of breath over the past month, but has had this previously with increase  in her reflux symptoms.   CURRENT MEDICATIONS:  1. Crestor 40 once daily.  2. Triamterine/hydrochlorothiazide 37.5/25  daily.  3. Synthroid 100 mcg daily.  4. Nexium 40 daily.  5. Baby aspirin 81 mg p.r.n.   ALLERGIES/INTOLERANCES:  CODEINE with itching, OXYCODONE with itching,  and ZESTRIL with cough.   PHYSICAL EXAMINATION:  A well-developed African-American female in no  acute distress.  Alert and oriented x3.  Weight is 215, blood pressure 124/70, pulse is 64.  CARDIOVASCULAR:  Regular rate and rhythm with S1 and S2, no murmur, rub,  or gallop.  PULMONARY:  Clear.  She does have some chest wall tenderness over the  sternum and lower anterior ribs.  ABDOMEN:  Soft.  There is no focal tenderness, no mass or  hepatosplenomegaly.  Bowel sounds are active.   IMPRESSION:  72. A 70 year old female with chronic gastroesophageal reflux disease      with probable NSAID-induced esophagitis.  Cannot rule out gastritis      or peptic ulcer disease.  2. Musculoskeletal chest wall pain, question costochondritis.  3. Osteoarthritis.  4. History  of colon polyps with small polyp with high-grade dysplasia,      2004.   PLAN:  1. Increase Nexium to b.i.d. x1 month and then decrease to daily.  2. Add Carafate slurry 1 g between meals and at h.s. for 10 days.  3. Will make followup with Dr. Leone [name], who Dr. Doreatha [name] had recommended to      her.  If she is not better after the above course, she will need      repeat esophagogastroduodenoscopy.  She also asked about taking      Celebrex, and I advised her to remain off of any kind of an      antiinflammatory for the time being.  4. Will need followup colonoscopy scheduled when she sees Dr. Leone [name].      Mike Gip, PA-C  Electronically Signed      Barbette Hair. Arlyce Dice, MD,FACG  Electronically Signed   AE/MedQ  DD: 03/27/2007  DT: 03/28/2007  Job #: [id number]

## 2010-12-25 NOTE — Cardiovascular Report (Signed)
NAMESEBA, MADOLE NO.:  [id number]   MEDICAL RECORD NO.:  [id number]          PATIENT TYPE:  INP   LOCATION:  3735                         FACILITY:  [facility]   PHYSICIAN:  Lyn Records, M.D.   DATE OF BIRTH:  1940-11-21   DATE OF PROCEDURE:  04/15/2007  DATE OF DISCHARGE:  04/15/2007                            CARDIAC CATHETERIZATION   REASON FOR PROCEDURE:  Exertional chest fullness, etiology uncertain,  rule out coronary disease in this patient with hypertension,  hyperlipidemia and a strong family history.   PROCEDURE PERFORMED:  1. Left heart cath.  2. Selective coronary angio.  3. Left ventriculography.  4. Angio-Seal closure   DESCRIPTION:  After informed consent, a 6-French sheath was placed in  the right femoral artery using the modified Seldinger technique.  A 6-  [place] A2 multipurpose catheter was then used for hemodynamic  recordings, left ventriculography by hand injection, and selective right  coronary angiography.  The patient then had an Angio-Seal performed for  hemostasis with good result and no complications.   RESULTS:  1. Hemodynamic data:      a.     Aortic pressure 110/60.      b.     Left ventricular pressure 109/12.  2. Left ventriculography: Left ventricular cavity size is normal.      Function is normal.  Ejection fraction is 65%.  No regional wall      motion abnormality is noted.  No mitral regurgitation is seen.  3. Coronary angiography:      a.     Left main coronary: Left main coronary artery is normal.      b.     Left anterior descending coronary: The left anterior       descending coronary artery is large and wraps around left       ventricular apex.  Gives origin to a large first diagonal and a       smaller second diagonal.  No obstruction is seen in the LAD.      c.     Circumflex: Four obtuse marginals arise from this vessel.       The vessel is smooth.  There is a large, late arising left atrial       recurrent  branch.  The second and third obtuse marginals are the       dominant of the marginal vessels.  The circumflex vessel is normal       with perhaps minimal luminal irregularity noted after the origin       of the third obtuse marginal.      d.     Right coronary:  The right coronary artery is dominant.       Gives the AV-nodal artery PDA artery.  No irregularities are       significant.  Obstruction is seen.   CONCLUSION:  1. Essentially normal coronary arteries.  2. Normal left ventricular function.  3. Successful Angio-Seal without bleeding complications.  4. Exertional chest discomfort with a background continuous burning      discomfort, likely GI related.   PLAN:  Consider further GI evaluation.  If nothing is found, the patient  needs to have CT aortography performed to rule out aortic aneurysm.Marland Kitchen      Lyn Records, M.D.  Electronically Signed     HWS/MEDQ  D:  04/15/2007  T:  04/16/2007  Job:  [id number]   cc:   Neta Mends. Fabian Sharp, MD  Iva Boop, MD,FACG

## 2010-12-27 ENCOUNTER — Ambulatory Visit
Admission: RE | Admit: 2010-12-27 | Discharge: 2010-12-27 | Disposition: A | Discharge status: Home / Self Care | Payer: Medicare Other | Source: Ambulatory Visit | Attending: Obstetrics and Gynecology | Admitting: Obstetrics and Gynecology

## 2010-12-27 DIAGNOSIS — Z1231 Encounter for screening mammogram for malignant neoplasm of breast: Secondary | ICD-10-CM

## 2010-12-28 NOTE — H&P (Signed)
NAME:  ROSENDA, GEFFRARD                       ACCOUNT NO.:  [id number]   MEDICAL RECORD NO.:  [id number]                   PATIENT TYPE:  INP   LOCATION:  0456                                 FACILITY:  [facility]   PHYSICIAN:  Ulyess Mort, M.D. [facility]         DATE OF BIRTH:  Sep 04, 1940   DATE OF ADMISSION:  06/21/2002  DATE OF DISCHARGE:                                HISTORY & PHYSICAL   CHIEF COMPLAINT:  Severe crampy abdominal pain followed by rectal bleeding.   HISTORY OF PRESENT ILLNESS:  The patient is a pleasant 70 year old African-  American female with a history of hypertension, GERD, benign thyroid nodule  which was resected several years ago. She also has a chronic iron deficiency  anemia and is status post previous hysterectomy, appendectomy and lumbar  back surgery for a benign tumor. The patient had undergone colonoscopy on  May 25, 2002, per Dr. Victorino Dike secondary to complaints of  constipation and occasional hematochezia. She was found to have a large  polyp in the ascending colon approximately 17 mm, sessile. This was  biopsied, cauterized and removed. She was also noted to have diverticulosis  in the left and transverse colon and internal and external and internal  hemorrhoids. The pathology from the polyp was consistent with a  tubulovillous adenoma with high grade dysplasia.   The patient was  referred to Dr. Kendrick Ranch  for consideration of resection,  as it was unclear whether all of the polyp had been completely resected.  Apparently it was felt that followup colonoscopy in three months will be  reasonable, as most or all of the polyp had been destroyed. The patient also  underwent upper endoscopy that same day, May 25, 2002, and was found to  have a small hiatal hernia, mild chronic esophagitis and some gastritis  changes with granular type mucosa in the antrum and pyloric area. Clo  testing was done and this was positive and she is subsequently  been treated  with  a Prev pack which she completed.   The patient has now had onset on June 18, 2002, while out of  town in  [place] with a severe labor like crampy abdominal pain that started at  about 11 p.m. on Friday night. This was associated with severe nausea  without vomiting, diaphoresis and urgency for bowel movement. She said  eventually she had a bowel movement without obvious blood and was able to go  back to bed and was awakened at approximately 2 a.m., again with severe  crampy abdominal pain and  then passed a bloody bowel movement. This  reoccurred at 4 a.m. and again at 6 a.m., and she was then evaluated in the  emergency room in [place]. A CBC was unremarkable. The stool was noted to  be  bloody. She was discharged from the emergency room.   She returned to [facility] and had two to three smaller episodes on  Saturday,  one on Sunday morning, and has had no bleeding or bowel movement  since that time. She says she is feeling better than she was, still  uncomfortable and sore in her abdomen but no severe pain. She has had any  documented fever or chills, and she has been able to eat small amounts of  solid food today.   She was seen and evaluated in the office today by Dr. Victorino Dike. She was  noted to have grossly bloody stool on rectal examination. A WBC was elevated  at 11.2, hemoglobin down to 11.2 from 12.8 on June 19, 2002, and the  patient was  subsequently admitted to the hospital for further diagnostic  evaluation.   CURRENT MEDICATIONS:  1. Lisinopril/HCTZ 20/12.5 q.d.  2. Baby aspirin 1 p.o. q.d., recently resumed.  3. Nexium 40 q.d.  4. Synthroid 100 mcg q.d.  5. Ferrous sulfate 325 q.d.  6. Multivitamin.  7. B6 supplement.  8. Magnesium supplement q.d.   ALLERGIES:  1. CODEINE CAUSES ITCHING.  2. OXYCODONE ALSO CAUSES ITCHING.   PAST MEDICAL HISTORY:  As outlined above.   FAMILY HISTORY:  Pertinent for one sister with colon polyps. No  family  history of colon cancer that she is aware of. Positive family history of  coronary artery disease and hypertension. Father deceased with an MI.   SOCIAL HISTORY:  The patient is retired, married. She is an ex-smoker since  71. No ETOH.   REVIEW OF SYSTEMS:  CARDIOVASCULAR:  No chest pain or anginal symptoms.  PULMONARY:  Negative for cough, shortness of breath or sputum production.  GENITOURINARY:  Negative.  GI:  Pertinent as above.   PHYSICAL EXAMINATION:  GENERAL:  A well developed African-American female in  no acute distress. Weight 206. She is ambulating.  VITAL SIGNS:  Afebrile, blood pressure 128/88, pulse 82.  HEENT:  Normocephalic, atraumatic.  EOMI.  PERRLA. Sclerae anicteric. She  does have an anterior incisional scar from her thyroid nodule.  CARDIOVASCULAR:  Regular rate and rhythm with S1, S2, no  murmurs, rubs,  gallops.  PULMONARY:  Clear to auscultation and percussion.  ABDOMEN:  Soft, bowel sounds active. She is tender in the left upper  quadrant, left mid quadrant and left lower quadrant. There is no rebound or  guarding, no masses or hepatosplenomegaly.  RECTAL:  Revealed maroonish stool per Dr. Victorino Dike.  EXTREMITIES:  Without cyanosis, clubbing or edema.  NEUROLOGIC:  Grossly nonfocal.   LABORATORY DATA:  The remainder of labs are pending at this time.   IMPRESSION:  20. A 70 year old African-American female with acute severe crampy abdominal     pain and rectal bleeding onset June 19, 2002, symptoms most consistent     with a segmental ischemic colitis, now resolving, rule out diverticular     bleed, rule out post polypectomy bleed, although feel both less likely.  2. Status post recent colonoscopy with polypectomy with finding of a 2 cm     ascending colon polyp with high grade dysplasia.  3. Gastroesophageal reflux disease.  4. Status post hysterectomy, appendectomy and removal of thyroid nodule. 5. Lumbar back repair, secondary to a  benign tumor.  6. Hypertension.   PLAN:  The patient is admitted for IV fluid hydration, bowel rest. She will  be placed on antispasmodics. We will follow a CBC. Hold on antibiotics for  the time being. Will check plain abdominal films. If she has persistent  bleeding, will consider repeat colonoscopy. If she has persistent  or  worsening pain, consider CT scan of the  abdomen and pelvis. If she  has continued improvement, will plan to treat  supportively and  then plan followup colonoscopy in two months to reassess  the polyp.  1. Consider a decreased dose of lisinopril.     Mike Gip, P.A.-C. [facility]                Ulyess Mort, M.D. [facility]    AE/MEDQ  D:  06/21/2002  T:  06/21/2002  Job:  [id number]

## 2010-12-28 NOTE — Op Note (Signed)
[facility]  Patient:    [name] [name], [name] [name]                    MRN: [id number] Proc. Date: 11/08/99 Adm. Date:  [id number] Attending:  Sonda Primes CC:         Mardene Celeste. Lurene Shadow, M.D. (2)                           Operative Report  PREOPERATIVE DIAGNOSIS:  Multinodular goiter, right thyroid lobe.  POSTOPERATIVE DIAGNOSIS:  Multinodular goiter, right thyroid lobe, pathology pending.  PROCEDURE:  Right thyroid lobectomy and isthmectomy.  SURGEON:  Mardene Celeste. Lurene Shadow, M.D.  ASSISTANT:  Marnee Spring. Wiliam Ke, M.D.  ANESTHESIA:  General.  INDICATIONS:  This patient is a 70 year old woman with an enlarging right thyroid nodule which on fine needle aspiration shows some microfollicular changes that ere not diagnostic of carcinoma but somewhat suspicious.  Because she has not responded to suppression therapy, she is now brought to the operating room for right-sided thyroid lobectomy with isthmectomy.  DESCRIPTION OF PROCEDURE:  Following the induction of anesthesia, the patient was positioned supine and placed in the reverse Trendelenburg position with head and neck hyperextended.  The anterior neck and chest were prepped and draped to be included in the sterile operative field.  A transverse collar incision taken down approximately two finger breadths above the sternal notch, deepened through the  skin and subcutaneous tissue and across the platysma muscle.  A superior flap is raised up for the thyroid cartilage and an inferior flap raised and carried down to the sternal notch.  Strap muscles are opened up in the midline and _____ and carried down to the thyroid.  The thyroid _____ and carried to the right lateral side, where the thyroid lobe was elevated and the middle thyroid vein taken between clamps and secured with ties.  Dissection of the lower pole of the thyroid revealed the recurrent laryngeal nerve, which was dissected free  and protected throughout the course of the dissection.  The inferior pole vessels were taken between clamps and secured with 2-0 silk sutures and the dissection carried up along the thyroid lobe over the recurrent laryngeal nerve, carrying the dissection off to the upper pole.  The upper pole was fully mobilized and doubly clamped and then transected and secured with ties of 0 silk.  The entire lobe was then retracted medially and dissection carried down over the trachea, taking along with it the isthmus of the thyroid.  At the left lateral aspect of the isthmus, the thyroid was transected  between clamps and removed and forwarded for pathologic evaluation.  The thyroid remnant was then secured with suture ligatures of 3-0 Vicryl.  All the areas of  dissection were then checked for hemostasis, additional bleeding points treated  with electrocautery.  Sponge, instrument, and sharp counts were then verified. The strap muscles closed in the midline with a running 3-0 Vicryl suture, the skin flaps were closed with interrupted 3-0 Vicryl sutures, reapproximating the platysma muscle and subcutaneous tissues.  Skin was closed with a 5-0 Monocryl suture and then reinforced with Steri-Strips, sterile dressing applied.  Anesthetic reversed. Patient removed from the operating room to the recovery room in stable condition, having tolerated the procedure well. DD:  11/08/99 TD:  11/08/99 Job: [id number]

## 2010-12-28 NOTE — Discharge Summary (Signed)
NAME:  [name] [name], [name] [name]                       ACCOUNT NO.:  [id number]   MEDICAL RECORD NO.:  [id number]                   PATIENT TYPE:  INP   LOCATION:  0456                                 FACILITY:  [facility]   PHYSICIAN:  Wilhemina Bonito. Marina Goodell, M.D. [facility]             DATE OF BIRTH:  08/19/1940   DATE OF ADMISSION:  06/21/2002  DATE OF DISCHARGE:  06/23/2002                                 DISCHARGE SUMMARY   ADMISSION DIAGNOSES:  56. A 70 year old African-American female with acute, severe, crampy     abdominal pain and rectal bleeding, onset June 19, 2002, symptoms most     consistent with segmental ischemic colitis now resolving, rule out     diverticular bleed, rule out postpolypectomy bleed, although feel both     less likely.  2. Status post recent colonoscopy with polypectomy with finding of a 2 cm     ascending colon polyp with high-grade dysplasia.  3. Gastroesophageal reflux disease.  4. Status post hysterectomy, appendectomy, and removal of a thyroid nodule.  5. Lumbar back repair secondary to a benign tumor.  6. Hypertension.   DISCHARGE DIAGNOSES:  1. Resolving segmental ischemic colitis.  2. Anemia, normocytic secondary to blood loss.  3. Hypokalemia resolved.  4. Ascending colon polyp with high-grade dysplasia with planned followup     colonoscopy within two months.   CONSULTATIONS:  None.   PROCEDURE:  None.   BRIEF HISTORY:  The patient is a pleasant 70 year old African-American  female with history as described above.  She had undergone colonoscopy on  May 25, 2002 per Dr. Victorino Dike secondary to complaints of constipation  and occasional hematochezia.  She was found to have a large polyp in the  ascending colon approximately 17-18 mm.  This was sessile, biopsied,  cauterized, and removed.  She was also noted to have diverticulosis in her  left colon and transverse colon, internal and external hemorrhoids.  Pathology from the polyp was consistent with a  tubovillous adenoma with high-  grade dysplasia.  She was subsequently referred to Dr. Lavell Islam for  consideration of resection as it was unclear whether all of the polyp base  had been completely resected.  It was felt that a followup colonoscopy in  three months would be a reasonable approach as most or all of the polyp had  been destroyed.  The patient has also undergone endoscopy at that same time,  was found to have some antral gastritis.  This was CLO positive and she was  treated with a PrevPac.   At this time, she had onset on June 18, 2002 while out of town in  [place] with severe labor-like crampy abdominal pain that started at about  11 p.m. on Friday night.  This was associated with severe nausea without  vomiting, diaphoresis and urgency for bowel movements.  She said eventually  she had a bowel movement without obvious blood and then  went back to bed and  was awakened at about 2 a.m. again with severe crampy pain and then passed a  bloody bowel movement.  This reoccurred again at 4 a.m. and at 6 a.m. and  she eventually went to an emergency room in [place].  CBC there was  unremarkable.  Stool was bloody.  She was discharged with plans to come back  to [place].  The patient had 2-3 smaller episodes on November 8th and one  on the morning of November 9th and had not had any bleeding or bowel  movements since that time.  She is feeling a bit better though uncomfortable  and sore in her abdomen but not complaining of severe pain.  She has not had  any fever or chills.  She was seen and evaluated in the office by Dr. Victorino Dike, noted to have grossly bloody stool on rectal exam.  WBC was  elevated at 11.2, hemoglobin 11.2 down from 12.8, and she was admitted to  the hospital for observation and further diagnostic evaluation.   LABORATORY AND ACCESSORY DATA:  On November 11th, WBC 7.3, hemoglobin 9.9,  hematocrit 30.1, platelets 152.  Sed rate of 55, pro time 13.7,  INR  of 1.  Electrolytes showed a potassium of 3.1, BUN 17, creatinine 0.7,  glucose 93, albumin 3.6.  Liver function studies within normal limits.  Followup CBC on November 12th showed a WBC of 5.7, hemoglobin 10.4,  hematocrit 30.5 and potassium of 3.8.   Plain abdominal films on admission were unremarkable.   HOSPITAL COURSE:  The patient was admitted to the service of Dr. Victorino Dike  and then cared for by Dr. Marina Goodell who is covering the hospital.  She was  initially hydrated, noted to be hypokalemic and potassium was corrected.  She was started on a clear liquid diet and dicyclomine for cramping.  We did  not initiate antibiotics.  The patient had a benign hospital course, did not  manifest any active bleeding while in the hospital.  She had one small bowel  movement on the evening of November 10th after a suppository and then one  small mucousy bowel movement on November 12th.  Her cramping and discomfort  has significantly improved and she was allowed discharge to home on June 23, 2002.  Decision was made not to pursue a repeat colonoscopy or flexible  sigmoidoscopy at this time as clinically her history was very consistent  with a segmental ischemic colitis, mild, and it was not felt that a repeat  procedure would change her management at this time.  It is anticipated that  she will undergo a repeat colonoscopy within two months as she needs  followup for the dysplastic polyp.   DIET ON DISCHARGE:  Low residue.   MEDICATIONS:  1. Lisinopril/HCTZ 20/2.5 mg q.d.  She was advised to decrease this to 1/2     tablet p.o. q.d.  The patient had been taking this in the evening and it     was felt that perhaps she had had a transient drop in her blood pressure     precipitating the ischemic colitis.  She also noted on the day of     discharge that she had been taking some Sudafed on a p.r.n. basis and she     was advised to avoid this for the time being. 2. Synthroid 100 mcg q.d.   3. Nexium 40 mg p.o. q.d.  4. Vitamins as previous.   CONDITION ON  DISCHARGE:  Stable and improved.   FOLLOW UP:  A return office visit with Dr. Victorino Dike on Friday November  21st at 3:15 p.m.     Amy Esterwood, P.A.-C. [facility]                John N. Marina Goodell, M.D. [facility]    AE/MEDQ  D:  06/23/2002  T:  06/23/2002  Job:  [id number]

## 2010-12-28 NOTE — Assessment & Plan Note (Signed)
[facility]                         GASTROENTEROLOGY OFFICE NOTE   NAME:Tandon, FIANA                      MRN:          [id number]  DATE:12/26/2006                            DOB:          13-Nov-1940    [name] [name] comes in on February 17 saying she has been having some pain  mainly in the pelvic bone area.  She saw Dr. Venetia Maxon, neurosurgery, about  this and he suggested some physical therapy, which she is going to start  doing.  Otherwise, she says she has been doing pretty well.  She just  had some questions prior to my retirement.   PHYSICAL EXAMINATION:  Weight 217, blood pressure 120/60, pulse 72 and  regular.  Neck and upper extremities all unremarkable.  There were no bruits or rubs over the abdomen.  She did have some slight  tenderness on palpation throughout of questionable significance.  RECTAL:  Deferred.   IMPRESSION:  1. Lower abdominal pain, right-sided pain, probably bone pain and      neurologically induced.  2. History of colon polyps in the past with multiple colon polyps in      the past.  3. Gastroesophageal reflux disease controlled fairly well with Nexium.  4. Mild obesity.  5. Irritable bowel syndrome.   RECOMMENDATIONS:  Continue her same medications and Nexium.  I gave her  some samples.  Told her to follow up some time within the next year for  care with 1 of my colleagues in my retirement.     Ulyess Mort, MD     SML/MedQ  DD: 12/26/2006  DT: 12/26/2006  Job #: [id number]

## 2010-12-28 NOTE — Assessment & Plan Note (Signed)
[facility]                         GASTROENTEROLOGY OFFICE NOTE   NAME:[name] [name]                      MRN:          [id number]  DATE:08/29/2006                            DOB:          [birth year]    [name] [name] comes in and says she has some pain in the right lower quadrant  and this goes over to her left upper quadrant.  She did have a CT scan  for similar symptoms ordered by Dr. Fabian Sharp in September 2006, and it did  not reveal anything.  There has been a question in the past of  diverticular disease but the last two colonoscopies did not reveal it,  although she had several small polyps at the time.  There is no evidence  that I could determine of diverticular disease.  [name] [name] also has  problems with GERD.   MEDICATIONS:  Crestor, hydrochlorothiazide, Synthroid, Nexium, and baby  aspirin.   Her last colonoscopic examination was in December 2004.  Her last upper  was in October 2003, she had a 2-cm hernia which was found then with  some chronic esophagitis and evidence of H. pylori which we treated.  She recently had an ultrasound of her pelvis by her gynecologist and was  told it was normal.  She said the pain is all the time.  Her bowel  activity is normal but it causes her discomfort when she stands, when  she sits, she is just very concerned about it.   PHYSICAL EXAMINATION:  VITAL SIGNS:  Revealed her weight 213, blood  pressure 110/70, pulse 68 and regular.  Exam is noncontributory or  unremarkable except for the abdomen being quite tender on palpation of  the right lower quadrant and left lower quadrant.   IMPRESSION:  Lower abdominal pain, question etiology.   RECOMMENDATIONS:  Get a repeat CT scan of the abdomen and pelvis as much  for reassurance as anything.  She has been put on some probiotics, and  she said this helped a little.  I am not sure this is diverticular  disease or diverticulitis cause the endoscopies in the past  have not  shown this and the CT did not either.  And, I tried to reassure her of  this and I did not think that any antibiotics would be indicated because  of this.     Ulyess Mort, MD  Electronically Signed    SML/MedQ  DD: 08/29/2006  DT: 08/29/2006  Job #: [id number]

## 2011-02-02 ENCOUNTER — Inpatient Hospital Stay (INDEPENDENT_AMBULATORY_CARE_PROVIDER_SITE_OTHER)
Admission: RE | Admit: 2011-02-02 | Discharge: 2011-02-02 | Disposition: A | Discharge status: Home / Self Care | Payer: Medicare Other | Source: Ambulatory Visit | Attending: Family Medicine | Admitting: Family Medicine

## 2011-02-02 DIAGNOSIS — B029 Zoster without complications: Secondary | ICD-10-CM

## 2011-02-04 ENCOUNTER — Telehealth: Payer: Self-pay | Admitting: *Deleted

## 2011-02-04 NOTE — Telephone Encounter (Signed)
Pt states that she was seen in the ED on Sat for shingles. Pt has got zovirax. It is on the right side of face. Pt states that she takes tylenol but it is not helping the pain. Pt would like something for pain to [facility].

## 2011-02-04 NOTE — Telephone Encounter (Signed)
Ov tomorrow afternoon  ( since i didn't see her for this problem )  In the meantime. she can get advice from the docs who treated her.

## 2011-02-05 NOTE — Telephone Encounter (Signed)
Pt aware and can't come in today but scheduled an appt for tomorrow.

## 2011-02-06 ENCOUNTER — Encounter: Payer: Self-pay | Admitting: Internal Medicine

## 2011-02-06 ENCOUNTER — Ambulatory Visit (INDEPENDENT_AMBULATORY_CARE_PROVIDER_SITE_OTHER): Payer: Medicare Other | Admitting: Internal Medicine

## 2011-02-06 VITALS — BP 120/80 | HR 72 | Temp 98.9°F | Wt 219.0 lb

## 2011-02-06 DIAGNOSIS — B029 Zoster without complications: Secondary | ICD-10-CM

## 2011-02-06 DIAGNOSIS — T887XXA Unspecified adverse effect of drug or medicament, initial encounter: Secondary | ICD-10-CM

## 2011-02-06 MED ORDER — GABAPENTIN 100 MG PO CAPS: ORAL_CAPSULE | ORAL | Status: AC

## 2011-02-06 MED ORDER — VALACYCLOVIR HCL 1 G PO TABS: 1000.0000 mg | ORAL_TABLET | Freq: Three times a day (TID) | ORAL | Status: DC

## 2011-02-06 NOTE — Progress Notes (Signed)
  Subjective:    Patient ID: [name] [name], female    DOB: 17-Jul-1941, 70 y.o.   MRN: [id number]  HPI Patient comes in today for followup of shingles on her right face that was diagnosed in urgent care on June 23. Since that time she is having increasing pain over other parts of her face. She has seen the eye doctor 2 days ago and noted to have no eye involvement. She has some blistery areas on her right for head and now her nose and some great tenderness over her right jaw down into her neck. It sometimes hurts to swallow. Her scalp is sensitive but has no rash there She is just taking Tylenol for pain and was given acyclovir 800 mg to take every 4 hours. She is going into the fourth day of her medication.   Had forgetting episodes with not remembering name of diagnosis when talking to the nurse and then reading something off of the TV screen. She is unsure if the medication could have been doing this. Her pain is significant and is very sensitive. She cannot take codeine or derivatives. In the past she had a funny side effects from Lyrica when she was given pain medicine by her neurosurgeon. She stated it made her think funny and unusual dreams.   She has no fever or other symptoms.  Review of Systems Negative for chest pain her shortness of breath is bad her on asthma medicines vision is good no other rashes her GYN did tell her that she has had herpes in the genital area in the past. No other GI GU symptoms. Does have some headache at times.  Past history family history social history reviewed in the electronic medical record.     Objective:   Physical Exam Well-developed well-nourished articulate oriented in no acute distress with some obvious vesicular shingles rash on the right for head right nose upper face. HEENT eyes clear and eoms nl nares patent Ears no lesion in canal and tm normal OP no lesions tongue midline Neck supple very tender right neck no obv masses. Chest:  Clear to  A&P without wheezes rales or rhonchi CV:  S1-S2 no gallops or murmurs peripheral perfusion is normal Neuro oriented grossly nl CN and gait.      See [facility] note reviewed   Assessment & Plan:  Shingles Right upper face and poss to involve more dermatomes by hx of pain .    ? Progressing .   Expectant management.     Will change to valtrex  ? If se of acyclovir vs other.  Pain  Disc options  Cant take codiene and prefers no Lyrica  Will try low dose neurontin. .  Risk benefit of medication discussed.  Not sure what to make of the other sx .   ? From pain and primary processes vs medicine.    Total visit > 50% spent counseling and coordinating care  reviewing Ed notes

## 2011-02-06 NOTE — Patient Instructions (Signed)
Change to valtrex  As an antiviral.  Can try low dose neurontin for pain  .   Can increase as recommended.    Call in a week about hw you are doing. When lesions are crusted you should not be able to transmit chicken pox.

## 2011-02-09 ENCOUNTER — Encounter: Payer: Self-pay | Admitting: Internal Medicine

## 2011-02-09 DIAGNOSIS — B029 Zoster without complications: Secondary | ICD-10-CM | POA: Insufficient documentation

## 2011-02-09 DIAGNOSIS — T887XXA Unspecified adverse effect of drug or medicament, initial encounter: Secondary | ICD-10-CM | POA: Insufficient documentation

## 2011-02-09 HISTORY — DX: Zoster without complications: B02.9

## 2011-02-09 NOTE — Assessment & Plan Note (Addendum)
Disc meds  And se   Will change tovaltrex as seems to be spreadings and poss se of med vs other cause  Although in same med group

## 2011-02-22 ENCOUNTER — Telehealth: Payer: Self-pay | Admitting: *Deleted

## 2011-02-22 MED ORDER — VALACYCLOVIR HCL 1 G PO TABS: 1000.0000 mg | ORAL_TABLET | Freq: Three times a day (TID) | ORAL | Status: DC

## 2011-02-22 NOTE — Telephone Encounter (Signed)
Pt called stating that she still has some spots that sensitive. One is a large one between eyebrows. The edges are crusted around the edges but not in the center. Pt has 3 around it that feel the same way. Pt took the last valtrex this week and wants another refill. [facility].

## 2011-02-22 NOTE — Telephone Encounter (Signed)
Pt aware of this and rx sent to pharmacy

## 2011-02-22 NOTE — Telephone Encounter (Signed)
Unsure if it will help but ol to refill the valtrex.

## 2011-02-27 ENCOUNTER — Ambulatory Visit (INDEPENDENT_AMBULATORY_CARE_PROVIDER_SITE_OTHER): Payer: Medicare Other | Admitting: Pulmonary Disease

## 2011-02-27 ENCOUNTER — Encounter: Payer: Self-pay | Admitting: Pulmonary Disease

## 2011-02-27 VITALS — BP 140/80 | HR 74 | Temp 98.6°F | Ht 66.0 in | Wt 215.2 lb

## 2011-02-27 DIAGNOSIS — R0609 Other forms of dyspnea: Secondary | ICD-10-CM

## 2011-02-27 DIAGNOSIS — R0989 Other specified symptoms and signs involving the circulatory and respiratory systems: Secondary | ICD-10-CM

## 2011-02-27 DIAGNOSIS — R0600 Dyspnea, unspecified: Secondary | ICD-10-CM

## 2011-02-27 NOTE — Progress Notes (Signed)
  Subjective:    Patient ID: [name] [name], female    DOB: January 13, 1941, 70 y.o.   MRN: [id number]  HPI The pt comes in today for f/u of her doe and cough.  She was found to have very mild occult airflow obstruction by pfts, and she was given a trial or foradil.  She also was asked to take PPI more consistently for an upper airway cough.  She took the foradil complaintly for a period of time, didn't see a difference, then began taking once a day sporadically.  She thinks she has benefited most from the reflux meds.  Her cough has resolved.  She is also trying to lose weight and work on an exercise program.     Review of Systems  Constitutional: Negative for fever and unexpected weight change.  HENT: Positive for congestion, sneezing and sinus pressure. Negative for ear pain, nosebleeds, sore throat, rhinorrhea, trouble swallowing, dental problem and postnasal drip.   Eyes: Negative for redness and itching.  Respiratory: Positive for cough. Negative for chest tightness, shortness of breath and wheezing.   Cardiovascular: Negative for palpitations and leg swelling.  Gastrointestinal: Negative for nausea and vomiting.  Genitourinary: Negative for dysuria.  Musculoskeletal: Negative for joint swelling.  Skin: Positive for rash.  Neurological: Negative for headaches.  Hematological: Does not bruise/bleed easily.  Psychiatric/Behavioral: Negative for dysphoric mood. The patient is not nervous/anxious.        Objective:   Physical Exam Ow female in nad Chest with clear bs throughout Cor with rrr LE without edema, no cyanosis Alert, oriented, moves all 4        Assessment & Plan:

## 2011-02-27 NOTE — Patient Instructions (Signed)
Stop foradil Continue to work on weight loss, and stay on reflux medication followup with me as needed.

## 2011-02-27 NOTE — Assessment & Plan Note (Signed)
The pt states that her breathing much better since taking her PPI more consistently and also working on staying active.  She really has not used foradil consistently, and I suspect it is not helping much.  I have asked her to stop, continue working on weight loss and exercise, and to stay on her PPI.  She will f/u with me as needed.

## 2011-03-13 ENCOUNTER — Other Ambulatory Visit: Payer: Self-pay | Admitting: Internal Medicine

## 2011-03-28 ENCOUNTER — Ambulatory Visit (INDEPENDENT_AMBULATORY_CARE_PROVIDER_SITE_OTHER): Payer: Medicare Other | Admitting: Internal Medicine

## 2011-03-28 DIAGNOSIS — Z2911 Encounter for prophylactic immunotherapy for respiratory syncytial virus (RSV): Secondary | ICD-10-CM

## 2011-03-28 DIAGNOSIS — Z23 Encounter for immunization: Secondary | ICD-10-CM

## 2011-05-24 LAB — CBC
HCT: 35.1 — ABNORMAL LOW
HCT: 37.6
Hemoglobin: 11.9 — ABNORMAL LOW
Hemoglobin: 12.5
MCHC: 33.3
MCHC: 33.9
MCV: 85.2
MCV: 86.7
Platelets: 150
Platelets: 209
RBC: 4.13
RBC: 4.33
RDW: 12.9
RDW: 13.3
WBC: 6.5
WBC: 6.7

## 2011-05-24 LAB — I-STAT 8, (EC8 V) (CONVERTED LAB)
Acid-Base Excess: 7 — ABNORMAL HIGH
BUN: 24 — ABNORMAL HIGH
Bicarbonate: 31.6 — ABNORMAL HIGH
Chloride: 104
Glucose, Bld: 100 — ABNORMAL HIGH
HCT: 40
Hemoglobin: 13.6
Operator id: 234501
Potassium: 3.6
Sodium: 140
TCO2: 33
pCO2, Ven: 41.7 — ABNORMAL LOW
pH, Ven: 7.488 — ABNORMAL HIGH

## 2011-05-24 LAB — D-DIMER, QUANTITATIVE (NOT AT ARMC): D-Dimer, Quant: 0.29

## 2011-05-24 LAB — DIFFERENTIAL
Basophils Absolute: 0
Basophils Relative: 0
Eosinophils Absolute: 0.1
Eosinophils Relative: 2
Lymphocytes Relative: 41
Lymphs Abs: 2.7
Monocytes Absolute: 0.6
Monocytes Relative: 9
Neutro Abs: 3.2
Neutrophils Relative %: 48

## 2011-05-24 LAB — TROPONIN I
Troponin I: 0.01
Troponin I: 0.01
Troponin I: 0.02

## 2011-05-24 LAB — COMPREHENSIVE METABOLIC PANEL
ALT: 20
AST: 25
Albumin: 3.6
Alkaline Phosphatase: 46
BUN: 22
CO2: 31
Calcium: 9.7
Chloride: 103
Creatinine, Ser: 0.82
GFR calc Af Amer: >60
GFR calc non Af Amer: >60
Glucose, Bld: 95
Potassium: 3.5
Sodium: 142
Total Bilirubin: 0.4
Total Protein: 6.9

## 2011-05-24 LAB — LIPID PANEL
Cholesterol: 125
HDL: 41
LDL Cholesterol: 48
Total CHOL/HDL Ratio: 3
Triglycerides: 180 — ABNORMAL HIGH
VLDL: 36

## 2011-05-24 LAB — CK TOTAL AND CKMB (NOT AT ARMC)
CK, MB: 1.5
CK, MB: 1.5
CK, MB: 1.6
Relative Index: 0.9
Relative Index: 0.9
Relative Index: 1
Total CK: 154
Total CK: 159
Total CK: 164

## 2011-05-24 LAB — POCT I-STAT CREATININE
Creatinine, Ser: 1
Operator id: 234501

## 2011-05-24 LAB — POCT CARDIAC MARKERS
CKMB, poc: 1.1
CKMB, poc: <1 — ABNORMAL LOW
Myoglobin, poc: 105
Myoglobin, poc: 80.9
Operator id: 234501
Operator id: 234501
Troponin i, poc: <0.05
Troponin i, poc: <0.05

## 2011-05-24 LAB — PROTIME-INR
INR: 0.9
Prothrombin Time: 12.8

## 2011-05-24 LAB — APTT
aPTT: 33
aPTT: 37

## 2011-06-05 ENCOUNTER — Telehealth: Payer: Self-pay | Admitting: Internal Medicine

## 2011-06-05 NOTE — Telephone Encounter (Signed)
Dr Leone Payor she is due 07/2012.  She is concerned and has had polyps in the past.  Is it ok to schedule one year early?

## 2011-06-05 NOTE — Telephone Encounter (Signed)
Ok to schedule colonoscopy V76.51 V12.72 v16.0

## 2011-06-20 ENCOUNTER — Ambulatory Visit (INDEPENDENT_AMBULATORY_CARE_PROVIDER_SITE_OTHER): Payer: Medicare Other | Admitting: Internal Medicine

## 2011-06-20 DIAGNOSIS — Z23 Encounter for immunization: Secondary | ICD-10-CM

## 2011-07-08 ENCOUNTER — Ambulatory Visit (AMBULATORY_SURGERY_CENTER): Payer: Medicare Other | Admitting: *Deleted

## 2011-07-08 VITALS — Ht 66.0 in | Wt 214.0 lb

## 2011-07-08 DIAGNOSIS — Z1211 Encounter for screening for malignant neoplasm of colon: Secondary | ICD-10-CM

## 2011-07-08 MED ORDER — PEG-KCL-NACL-NASULF-NA ASC-C 100 G PO SOLR: ORAL | Status: DC

## 2011-07-12 ENCOUNTER — Encounter: Payer: Self-pay | Admitting: Internal Medicine

## 2011-07-12 ENCOUNTER — Ambulatory Visit (INDEPENDENT_AMBULATORY_CARE_PROVIDER_SITE_OTHER): Payer: Medicare Other | Admitting: Internal Medicine

## 2011-07-12 VITALS — BP 120/80 | HR 69 | Temp 99.2°F | Wt 217.0 lb

## 2011-07-12 DIAGNOSIS — Z800 Family history of malignant neoplasm of digestive organs: Secondary | ICD-10-CM

## 2011-07-12 DIAGNOSIS — J209 Acute bronchitis, unspecified: Secondary | ICD-10-CM

## 2011-07-12 DIAGNOSIS — J069 Acute upper respiratory infection, unspecified: Secondary | ICD-10-CM

## 2011-07-12 DIAGNOSIS — J208 Acute bronchitis due to other specified organisms: Secondary | ICD-10-CM

## 2011-07-12 NOTE — Patient Instructions (Signed)
Ok to take mucinex  For the loosening .  Of the chest . This seems like a bad chest cold.  No evidence of pneumonia on exam today.   Call the Gi dept on Monday  about the colonoscopy but if you dont have a fever i think is is usually ok

## 2011-07-12 NOTE — Progress Notes (Signed)
  Subjective:    Patient ID: [name] [name], female    DOB: 09/06/1940, 70 y.o.   MRN: [id number]  HPI Patient comes in today for SDA  For acute problem evaluation. Onset 5 days ago.   Achy and ha an cough and sneezing  . Yesterday then some rattle and was worried .     This am  Alka seltzer cold plus. No using mucinex.  And dod help last night and not wheezing .  Review of Systems No fever ha  Hemoptysis   Has sneezing better today  But is weekend  Past history family history social history reviewed in the electronic medical record.     Objective:   Physical Exam WDWN in NAD  quiet respirations; mildly congested  somewhat hoarse. Non toxic . HEENT: Normocephalic ;atraumatic , Eyes;  PERRL, EOMs  Full, lids and conjunctiva clear,,Ears: no deformities, canals nl, TM landmarks normal, Nose: no deformity or discharge but congested;face minimally tender Mouth : OP clear without lesion or edema . Neck: Supple without adenopathy or masses or bruits Chest:  Clear to A&P without wheezes rales or rhonchi CV:  S1-S2 no gallops or murmurs peripheral perfusion is normal Skin :nl perfusion and no acute rashes       Assessment & Plan:  Acute viral bronchitis uri  Expect this to be self limited   To have colon screen for fam hx of colon cancer next week. Prev evaluation.   Pulmonary and ok  See last note.  Minimal obs dis felt most sx from GERD

## 2011-07-18 ENCOUNTER — Ambulatory Visit (AMBULATORY_SURGERY_CENTER): Payer: Medicare Other | Admitting: Internal Medicine

## 2011-07-18 ENCOUNTER — Encounter: Payer: Self-pay | Admitting: Internal Medicine

## 2011-07-18 VITALS — BP 111/70 | HR 56 | Temp 98.1°F | Resp 20 | Ht 66.0 in | Wt 214.0 lb

## 2011-07-18 DIAGNOSIS — Z860100 Personal history of colon polyps, unspecified: Secondary | ICD-10-CM | POA: Insufficient documentation

## 2011-07-18 DIAGNOSIS — D126 Benign neoplasm of colon, unspecified: Secondary | ICD-10-CM

## 2011-07-18 DIAGNOSIS — Z1211 Encounter for screening for malignant neoplasm of colon: Secondary | ICD-10-CM

## 2011-07-18 DIAGNOSIS — Z800 Family history of malignant neoplasm of digestive organs: Secondary | ICD-10-CM

## 2011-07-18 MED ORDER — SODIUM CHLORIDE 0.9 % IV SOLN: 500.0000 mL | INTRAVENOUS | Status: DC

## 2011-07-18 NOTE — Progress Notes (Signed)
Patient did not experience any of the following events: a burn prior to discharge; a fall within the facility; wrong site/side/patient/procedure/implant event; or a hospital transfer or hospital admission upon discharge from the facility. (G8907) Patient did not have preoperative order for IV antibiotic SSI prophylaxis. (G8918)  

## 2011-07-18 NOTE — Op Note (Signed)
[facility] [address] [place], [place]  [address]  COLONOSCOPY PROCEDURE REPORT  PATIENT:  [name] [name], [name] [name]  MR#:  [id number] BIRTHDATE:  22-Feb-1941, 70 yrs. old  GENDER:  female ENDOSCOPIST:  Iva Boop, MD, [facility]  PROCEDURE DATE:  07/18/2011 PROCEDURE:  Colonoscopy with biopsy and snare polypectomy ASA CLASS:  Class II INDICATIONS:  surveillance and high-risk screening, history of pre-cancerous (adenomatous) colon polyps, family history of colon cancer 2 sisters and an aunt with colon cancer MEDICATIONS:   These medications were titrated to patient response per physician's verbal order, Fentanyl 50 mcg IV, Versed 8 mg IV  DESCRIPTION OF PROCEDURE:   After the risks benefits and alternatives of the procedure were thoroughly explained, informed consent was obtained.  Digital rectal exam was performed and revealed that it was tender but no masses felt.   The LB CF-[id number] endoscope was introduced through the anus and advanced to the cecum, which was identified by both the appendix and ileocecal valve, without limitations.  The quality of the prep was excellent, using MoviPrep.  The instrument was then slowly withdrawn as the colon was fully examined. <<PROCEDUREIMAGES>>  FINDINGS:  Three polyps were found. 8 mm splenic flexure polyp with snare cautery removal, 5 mm descending polyp cold snared and 3 mm descending polyp cold biopsy removal.  Scattered right colon diverticulosis. This was otherwise a normal examination of the colon. She is very sensitive to scope passage in rectum and sigmoid with a rectal loop effect.   Retroflexed views in the rectum revealed it was not tolerated by the patient.    The time to cecum = 4:22 minutes. The scope was then withdrawn in 12:27 minutes from the cecum and the procedure completed. COMPLICATIONS:  None ENDOSCOPIC IMPRESSION: 1) Three polyps removed, largest 8 mm 2) Scattered right colon diverticula 2) Otherwise  normal examination - but sensitive to scope in rectum and sigmoid RECOMMENDATIONS: Consider MAC sedation with propofol in future Avoid aspirin/NSAID's for 2 weeks REPEAT EXAM:  In for Colonoscopy, pending biopsy results. likely 3 years  Iva Boop, MD, [facility]  CC:  The Patient  n. eSIGNED:   Iva Boop at 07/18/2011 10:38 AM  Randall Hiss, [id number]

## 2011-07-18 NOTE — Patient Instructions (Addendum)
3 polyps were removed today. They looked benign. I will have them analyzed and let you know the results and timing of next colonoscopy, which I think I will suggest you have in 3 years.  Iva Boop, MD, [facility]   FOLLOW DISCHARGE INSTRUCTIONS (BLUE & GREEN SHEETS).  INFORMATION ON POLYPS, DIVERTICULOSIS & HIGH FIBER DIET  GIVEN TO YOU.

## 2011-07-19 ENCOUNTER — Telehealth: Payer: Self-pay | Admitting: *Deleted

## 2011-07-19 NOTE — Telephone Encounter (Signed)

## 2011-07-25 ENCOUNTER — Encounter: Payer: Self-pay | Admitting: Internal Medicine

## 2011-07-25 NOTE — Progress Notes (Signed)
Quick Note:  Adenomas Repeat colonoscopy 07/2014 = 3 years ______

## 2011-07-31 ENCOUNTER — Telehealth: Payer: Self-pay | Admitting: *Deleted

## 2011-07-31 NOTE — Telephone Encounter (Signed)
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

## 2011-08-08 NOTE — Telephone Encounter (Signed)
Ok but unsure when needs to be done .  If not a medicare wellness can fit her in earlier otherwise  May not be until february.  Please check on form tp see what she really needs thnask

## 2011-08-13 DIAGNOSIS — H269 Unspecified cataract: Secondary | ICD-10-CM

## 2011-08-13 HISTORY — DX: Unspecified cataract: H26.9

## 2011-08-14 NOTE — Telephone Encounter (Signed)
Pt to mail the form to [place] then we can schedule her from there.

## 2011-08-29 ENCOUNTER — Telehealth: Payer: Self-pay | Admitting: *Deleted

## 2011-08-29 MED ORDER — SIMVASTATIN 40 MG PO TABS: 40.0000 mg | ORAL_TABLET | Freq: Every day | ORAL | Status: DC

## 2011-08-29 MED ORDER — LEVOTHYROXINE SODIUM 100 MCG PO TABS: 100.0000 ug | ORAL_TABLET | Freq: Every day | ORAL | Status: DC

## 2011-08-29 NOTE — Telephone Encounter (Signed)
Refill on synthroid and simvastatin

## 2011-09-04 DIAGNOSIS — M25529 Pain in unspecified elbow: Secondary | ICD-10-CM | POA: Diagnosis not present

## 2011-09-04 DIAGNOSIS — M79609 Pain in unspecified limb: Secondary | ICD-10-CM | POA: Diagnosis not present

## 2011-09-10 ENCOUNTER — Encounter: Payer: Medicare Other | Admitting: Internal Medicine

## 2011-09-10 ENCOUNTER — Telehealth: Payer: Self-pay | Admitting: *Deleted

## 2011-09-10 NOTE — Telephone Encounter (Signed)
Pt had to cancel her cpx for today due to her car problems. She would like to reschedule this appointment soon. Where can we work her in?

## 2011-09-11 NOTE — Telephone Encounter (Signed)
How about 11 15 and 11 30 on tues feb 19th

## 2011-10-16 ENCOUNTER — Encounter: Payer: Self-pay | Admitting: Internal Medicine

## 2011-10-16 ENCOUNTER — Ambulatory Visit (INDEPENDENT_AMBULATORY_CARE_PROVIDER_SITE_OTHER): Payer: Medicare Other | Admitting: Internal Medicine

## 2011-10-16 VITALS — BP 124/74 | HR 63 | Temp 98.7°F | Ht 66.0 in | Wt 216.0 lb

## 2011-10-16 DIAGNOSIS — R0609 Other forms of dyspnea: Secondary | ICD-10-CM | POA: Diagnosis not present

## 2011-10-16 DIAGNOSIS — Z7189 Other specified counseling: Secondary | ICD-10-CM | POA: Insufficient documentation

## 2011-10-16 DIAGNOSIS — R7309 Other abnormal glucose: Secondary | ICD-10-CM | POA: Diagnosis not present

## 2011-10-16 DIAGNOSIS — E039 Hypothyroidism, unspecified: Secondary | ICD-10-CM

## 2011-10-16 DIAGNOSIS — R82998 Other abnormal findings in urine: Secondary | ICD-10-CM | POA: Diagnosis not present

## 2011-10-16 DIAGNOSIS — G4733 Obstructive sleep apnea (adult) (pediatric): Secondary | ICD-10-CM | POA: Insufficient documentation

## 2011-10-16 DIAGNOSIS — E785 Hyperlipidemia, unspecified: Secondary | ICD-10-CM

## 2011-10-16 DIAGNOSIS — Z0000 Encounter for general adult medical examination without abnormal findings: Secondary | ICD-10-CM | POA: Diagnosis not present

## 2011-10-16 DIAGNOSIS — E663 Overweight: Secondary | ICD-10-CM

## 2011-10-16 DIAGNOSIS — K219 Gastro-esophageal reflux disease without esophagitis: Secondary | ICD-10-CM

## 2011-10-16 DIAGNOSIS — Z9289 Personal history of other medical treatment: Secondary | ICD-10-CM

## 2011-10-16 DIAGNOSIS — R0989 Other specified symptoms and signs involving the circulatory and respiratory systems: Secondary | ICD-10-CM | POA: Diagnosis not present

## 2011-10-16 DIAGNOSIS — R8290 Unspecified abnormal findings in urine: Secondary | ICD-10-CM

## 2011-10-16 DIAGNOSIS — I10 Essential (primary) hypertension: Secondary | ICD-10-CM

## 2011-10-16 DIAGNOSIS — R0683 Snoring: Secondary | ICD-10-CM

## 2011-10-16 HISTORY — DX: Personal history of other medical treatment: Z92.89

## 2011-10-16 LAB — HEPATIC FUNCTION PANEL
ALT: 18 U/L (ref 0–35)
AST: 34 U/L (ref 0–37)
Albumin: 4.3 g/dL (ref 3.5–5.2)
Alkaline Phosphatase: 53 U/L (ref 39–117)
Bilirubin, Direct: 0.6 mg/dL — ABNORMAL HIGH (ref 0.0–0.3)
Total Bilirubin: 1.4 mg/dL — ABNORMAL HIGH (ref 0.3–1.2)
Total Protein: 7.5 g/dL (ref 6.0–8.3)

## 2011-10-16 LAB — CBC WITH DIFFERENTIAL/PLATELET
Basophils Absolute: 0 10*3/uL (ref 0.0–0.1)
Basophils Relative: 0.5 % (ref 0.0–3.0)
Eosinophils Absolute: 0.2 10*3/uL (ref 0.0–0.7)
Eosinophils Relative: 3.2 % (ref 0.0–5.0)
HCT: 41.3 % (ref 36.0–46.0)
Hemoglobin: 13.3 g/dL (ref 12.0–15.0)
Lymphocytes Relative: 41.4 % (ref 12.0–46.0)
Lymphs Abs: 2.8 10*3/uL (ref 0.7–4.0)
MCHC: 32.3 g/dL (ref 30.0–36.0)
MCV: 89.9 fl (ref 78.0–100.0)
Monocytes Absolute: 0.5 10*3/uL (ref 0.1–1.0)
Monocytes Relative: 6.7 % (ref 3.0–12.0)
Neutro Abs: 3.3 10*3/uL (ref 1.4–7.7)
Neutrophils Relative %: 48.2 % (ref 43.0–77.0)
Platelets: 161 10*3/uL (ref 150.0–400.0)
RBC: 4.59 Mil/uL (ref 3.87–5.11)
RDW: 14.5 % (ref 11.5–14.6)
WBC: 6.8 10*3/uL (ref 4.5–10.5)

## 2011-10-16 LAB — POCT URINALYSIS DIPSTICK
Bilirubin, UA: NEGATIVE
Blood, UA: NEGATIVE
Glucose, UA: NEGATIVE
Ketones, UA: NEGATIVE
Nitrite, UA: NEGATIVE
Protein, UA: NEGATIVE
Spec Grav, UA: 1.025
Urobilinogen, UA: 0.2
pH, UA: 5.5

## 2011-10-16 LAB — BASIC METABOLIC PANEL
BUN: 17 mg/dL (ref 6–23)
CO2: 27 mEq/L (ref 19–32)
Calcium: 9.4 mg/dL (ref 8.4–10.5)
Chloride: 103 mEq/L (ref 96–112)
Creatinine, Ser: 0.8 mg/dL (ref 0.4–1.2)
GFR: 91.26 mL/min (ref >60.00)
Glucose, Bld: 91 mg/dL (ref 70–99)
Potassium: 6.2 mEq/L (ref 3.5–5.1)
Sodium: 137 mEq/L (ref 135–145)

## 2011-10-16 LAB — LIPID PANEL
Cholesterol: 176 mg/dL (ref 0–200)
HDL: 58.1 mg/dL (ref >39.00)
LDL Cholesterol: 91 mg/dL (ref 0–99)
Total CHOL/HDL Ratio: 3
Triglycerides: 134 mg/dL (ref 0.0–149.0)
VLDL: 26.8 mg/dL (ref 0.0–40.0)

## 2011-10-16 LAB — TSH: TSH: 0.75 u[IU]/mL (ref 0.35–5.50)

## 2011-10-16 LAB — HEMOGLOBIN A1C: Hgb A1c MFr Bld: 6.1 % (ref 4.6–6.5)

## 2011-10-16 NOTE — Progress Notes (Signed)
Subjective:    Patient ID: [name] [name], female    DOB: 05-14-1941, 71 y.o.   MRN: [id number]  HPI Patient comes in today for preventive visit and follow-up of medical issues. 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  Dr Amanda Pea gave her Samples of ce;lebrex  For arm shoulder pain  Had gi upset.   Less with aleve  Had episode in JAn of awakening  Hard to catch her breath . snoring and ? Sleep breathing issues  ? If  Could have OSA. Husband used to say he was worried about her stopping breathing GERD   On meds  Gets throat clearing at times  LIPIDS taking meds  Had colonoscopy  And on q 3 years recall. HT stable    Hearing:  Ok   Vision:  No limitations at present .  Safety:  Has smoke detector and wears seat belts.  No firearms. No excess sun exposure. Sees dentist regularly.  Falls:  None   Advance directive :  Reviewed  Has one.  Memory: Felt to be good  , no concern from her or her family.  Depression: No anhedonia unusual crying or depressive symptoms  Nutrition: Eats well balanced diet; adequate calcium and vitamin D. No swallowing chewiing problems.  Injury: no major injuries in the last six months.  Other healthcare providers:  Reviewed today .  Social:  Widowed . No pets.   Preventive parameters: up-to-date on colonoscopy, mammogram, immunizations. Including Tdap and pneumovax.  ADLS:   There are no problems or need for assistance  driving, feeding, obtaining food, dressing, toileting and bathing, managing money using phone. She is independent.    Review of Systems ROS:  GEN/ HEENT: No fever, significant weight changes sweats headaches vision problems hearing changes, CV/ PULM; No chest pain  cough, syncope,edema  change in exercise tolerance. GI /GU: No adominal pain, vomiting, change in bowel habits. No blood in the stool. No significant GU symptoms. But urine has bad odor   Treated for yeast a while back per her gyne SKIN/HEME: ,no acute skin rashes suspicious lesions or bleeding. No lymphadenopathy, nodules, masses.  NEURO/ PSYCH:  No neurologic signs such as weakness numbness. No depression anxiety. IMM/ Allergy: No unusual infections.  Allergy .    Tried claritin d . REST of 12 system review negative except as per HPI Outpatient Prescriptions Prior to Visit  Medication Sig Dispense Refill  . fish oil-omega-3 fatty acids 1000 MG capsule Take 2 g by mouth daily.        . fluticasone (FLONASE) 50 MCG/ACT nasal spray USE 2 SPRAYS IN EACH NOSTRIL EVERY DAY  FOR RHINITIS  3 Act  0  . glucosamine-chondroitin 500-400 MG tablet Take 1 tablet by mouth 3 (three) times daily.        Marland Kitchen levothyroxine (SYNTHROID) 100 MCG tablet Take 1 tablet (100 mcg total) by mouth daily.  90 tablet  0  . omeprazole (PRILOSEC) 20 MG capsule Take 20 mg by mouth daily.       . simvastatin (ZOCOR) 40 MG tablet Take 1 tablet (40 mg total) by mouth at bedtime.  90 tablet  0  . triamterene-hydrochlorothiazide (MAXZIDE-25) 37.5-25 MG per tablet TAKE 1 TABLET BY MOUTH EVERY DAY  90 tablet  1  . acetaminophen (TYLENOL) 650 MG CR tablet Take 650 mg by mouth every 8 (eight) hours as  needed.        . Cholecalciferol (VITAMIN D) 1000 UNITS capsule Take 1,000 Units by mouth daily as needed.        Past history family history social history reviewed in the electronic medical record.  .    Objective:   Physical Exam Wt Readings from Last 3 Encounters:  10/16/11 216 lb (97.977 kg)  07/18/11 214 lb (97.07 kg)  07/12/11 217 lb (98.431 kg)   Physical Exam: Vital signs reviewed ZOX:WRUE is a well-developed well-nourished alert cooperative  aa female who appears her stated age in no acute distress.  HEENT: normocephalic atraumatic , Eyes: PERRL EOM's full, conjunctiva clear, Nares: paten,t no deformity discharge or tenderness., Ears: no deformity EAC's clear TMs with normal landmarks. Mouth: clear OP,  no lesions, edema.  Moist mucous membranes. Dentition in adequate repair. NECK: supple without masses, thyromegaly or bruits. CHEST/PULM:  Clear to auscultation and percussion breath sounds equal no wheeze , rales or rhonchi. No chest wall deformities or tenderness. Breast: normal by inspection . No dimpling, discharge, masses, tenderness or discharge .  CV: PMI is nondisplaced, S1 S2 no gallops, murmurs, rubs. Peripheral pulses are full without delay.No JVD .  ABDOMEN: Bowel sounds normal nontender but sore  In mid abdomen  No guard or rebound, no hepato splenomegal no CVA tenderness.  No hernia. Extremtities:  No clubbing cyanosis or edema, no acute joint swelling or redness no focal atrophy NEURO:  Oriented x3, cranial nerves 3-12 appear to be intact, no obvious focal weakness,gait within normal limits  SKIN: No acute rashes normal turgor, color, no bruising or petechiae. PSYCH: Oriented, good eye contact, no obvious depression anxiety, cognition and judgment appear normal. LN: no cervical axillary inguinal adenopathy   Oriented x 3 and no noted deficits in memory, attention, and speech.      Assessment & Plan:  Preventive Health Care Counseled regarding healthy nutrition, exercise, sleep, injury prevention, calcium vit d and healthy weight .  Hypertension  Controlled seemingly   Thyroid nodule  And hypothyroid Snoring ? If OSA    GERD GI isses  On ppi   Has adenomatous polyps.  Night time decaf may aggravate t reflux   MS  DJD se of some nsaid    LIPIDS: no se of meds  Hx of pos ppd treated . Abnormal urine odor  Check  Urine today  Prolonged visit  If needed bring back form for job if needed and can get cxray at that time.

## 2011-10-16 NOTE — Patient Instructions (Addendum)
Will notify you  of labs when available.  Weight loss  May help some of your med problems.  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   Will arrange for you to see the sleep physician .  When you need the job form call and we should get a chest x ray  . You dont need a tb skin test because you had a  positive in the past.

## 2011-10-23 ENCOUNTER — Other Ambulatory Visit: Payer: Medicare Other

## 2011-10-23 ENCOUNTER — Other Ambulatory Visit: Payer: Self-pay | Admitting: Internal Medicine

## 2011-10-23 DIAGNOSIS — R300 Dysuria: Secondary | ICD-10-CM

## 2011-10-23 NOTE — Progress Notes (Signed)
Quick Note:  Pt aware of results. ______ 

## 2011-10-24 ENCOUNTER — Other Ambulatory Visit: Payer: Medicare Other

## 2011-10-24 DIAGNOSIS — R300 Dysuria: Secondary | ICD-10-CM | POA: Diagnosis not present

## 2011-10-26 LAB — URINE CULTURE: Colony Count: 6000

## 2011-10-29 NOTE — Progress Notes (Signed)
Quick Note:  Tell patient that urine culture shows insignificant level of bacteria that Is not identified. would not treat for a UTI unless gets symptoms of such. ______

## 2011-10-30 ENCOUNTER — Encounter: Payer: Medicare Other | Admitting: Internal Medicine

## 2011-10-30 NOTE — Progress Notes (Signed)
Quick Note:  Pt aware of results. ______ 

## 2011-11-04 ENCOUNTER — Ambulatory Visit (INDEPENDENT_AMBULATORY_CARE_PROVIDER_SITE_OTHER): Payer: Medicare Other | Admitting: Pulmonary Disease

## 2011-11-04 ENCOUNTER — Encounter: Payer: Self-pay | Admitting: Pulmonary Disease

## 2011-11-04 VITALS — BP 130/70 | HR 72 | Temp 98.6°F | Ht 66.0 in | Wt 216.8 lb

## 2011-11-04 DIAGNOSIS — R0609 Other forms of dyspnea: Secondary | ICD-10-CM | POA: Diagnosis not present

## 2011-11-04 DIAGNOSIS — G4733 Obstructive sleep apnea (adult) (pediatric): Secondary | ICD-10-CM

## 2011-11-04 DIAGNOSIS — R0989 Other specified symptoms and signs involving the circulatory and respiratory systems: Secondary | ICD-10-CM | POA: Diagnosis not present

## 2011-11-04 DIAGNOSIS — R0683 Snoring: Secondary | ICD-10-CM

## 2011-11-04 NOTE — Patient Instructions (Signed)
Will schedule you for a sleep study, and arrange followup with me once results are available.

## 2011-11-04 NOTE — Assessment & Plan Note (Signed)
The patient's history is very suspicious for clinically significant sleep disordered breathing.  She has been noted to have snoring, and abnormal breathing pattern during sleep, choking arousals, and also frequent awakenings with nonrestorative sleep.  She also has definite sleep pressure during the day when she is quiet.  I have had a long discussion with her about the pathophysiology of sleep apnea, including its impact on her quality of life and cardiovascular health.  I think there is enough suspicion and concern here to proceed with formal sleep testing.  The patient is agreeable to this approach.

## 2011-11-04 NOTE — Progress Notes (Signed)
  Subjective:    Patient ID: [name] [name], female    DOB: Dec 06, 1940, 71 y.o.   MRN: [id number]  HPI The patient is a 71 year old female who had been asked to see for possible obstructive sleep apnea.  She has been noted by her family to have loud snoring, as well as an abnormal breathing pattern during sleep at times.  She also notes occasional choking arousals.  She notes frequent awakenings during the night, and does not feel rested in the mornings upon arising.  She notes significant sleep pressure during the day with periods of inactivity, and will also doze in the evening while watching television.  She has also noted occasional sleep pressure with driving.  The patient's weight is up about 6 pounds over the last 2 years, and her Epworth sleepiness score today is 12.  Sleep Questionnaire: What time do you typically go to bed?( Between what hours) 10:30 to 11:30 pm How long does it take you to fall asleep? sometimes 5 mins- othertimes longer How many times during the night do you wake up? 4 What time do you get out of bed to start your day? 0630 Do you drive or operate heavy machinery in your occupation? No How much has your weight changed (up or down) over the past two years? (In pounds) 7 lb (3.175 kg) Have you ever had a sleep study before? No Do you currently use CPAP? No Do you wear oxygen at any time? No     Review of Systems  Constitutional: Negative for fever and unexpected weight change.  HENT: Positive for congestion. Negative for ear pain, nosebleeds, sore throat, rhinorrhea, sneezing, trouble swallowing, dental problem, postnasal drip and sinus pressure.   Eyes: Negative for redness and itching.  Respiratory: Negative for cough, chest tightness, shortness of breath and wheezing.   Cardiovascular: Negative for palpitations and leg swelling.  Gastrointestinal: Positive for abdominal pain. Negative for nausea and vomiting.  Genitourinary: Negative for dysuria.  Musculoskeletal:  Positive for joint swelling.  Skin: Negative for rash.  Neurological: Negative for headaches.  Hematological: Does not bruise/bleed easily.  Psychiatric/Behavioral: Negative for dysphoric mood. The patient is nervous/anxious.        Objective:   Physical Exam Constitutional: overweight female, no acute distress  HENT:  Nares patent without discharge, mildly enlarged turbinates.  Oropharynx without exudate, palate and uvula are minimally elongated, +side wall narrowing  Eyes:  Perrla, eomi, no scleral icterus  Neck:  No JVD, no TMG  Cardiovascular:  Normal rate, regular rhythm, no rubs or gallops.  2/6 sem        Intact distal pulses  Pulmonary :  Normal breath sounds, no stridor or respiratory distress   No rales, rhonchi, or wheezing  Abdominal:  Soft, nondistended, bowel sounds present.  No tenderness noted.   Musculoskeletal:  mild lower extremity edema noted.  Lymph Nodes:  No cervical lymphadenopathy noted  Skin:  No cyanosis noted  Neurologic:  Alert, appropriate, moves all 4 extremities without obvious deficit.         Assessment & Plan:

## 2011-11-16 ENCOUNTER — Other Ambulatory Visit: Payer: Self-pay | Admitting: Internal Medicine

## 2011-11-19 ENCOUNTER — Ambulatory Visit (HOSPITAL_BASED_OUTPATIENT_CLINIC_OR_DEPARTMENT_OTHER): Payer: Medicare Other | Attending: Pulmonary Disease | Admitting: Radiology

## 2011-11-19 VITALS — Ht 66.0 in | Wt 213.0 lb

## 2011-11-19 DIAGNOSIS — R0683 Snoring: Secondary | ICD-10-CM

## 2011-11-19 DIAGNOSIS — G4733 Obstructive sleep apnea (adult) (pediatric): Secondary | ICD-10-CM | POA: Diagnosis not present

## 2011-11-27 ENCOUNTER — Telehealth: Payer: Self-pay | Admitting: *Deleted

## 2011-11-27 DIAGNOSIS — G4733 Obstructive sleep apnea (adult) (pediatric): Secondary | ICD-10-CM | POA: Diagnosis not present

## 2011-11-27 NOTE — Procedures (Signed)
[name] [name], DONOVAN NO.:  [id number]  MEDICAL RECORD NO.:  [id number]          PATIENT TYPE:  OUT  LOCATION:  SLEEP CENTER                 FACILITY[facility]  [facility]  PHYSICIAN:  Barbaraann Share, MD,FCCPDATE OF BIRTH:  01-31-42  DATE OF STUDY:  11/19/2011                           NOCTURNAL POLYSOMNOGRAM  REFERRING PHYSICIAN:  Barbaraann Share, MD,FCCP  LOCATION:  Sleep Lab.  REFERRING PHYSICIAN:  Barbaraann Share, MD, FCCP  INDICATION FOR STUDY:  Hypersomnia with sleep apnea.  MEDICATIONS:  EPWORTH SCORE SCORE:  9.  SLEEP ARCHITECTURE:  The patient had total sleep time of 330 minutes with no slow-wave sleep and only 58 minutes of REM.  Sleep onset latency was normal at 14 minutes and REM onset was normal at 99 minutes.  Sleep efficiency was moderately reduced at 82%.  RESPIRATORY DATA:  The patient was found to have 10 obstructive and central apneas as well as 52 obstructive hypopneas, giving her an apnea- hypopnea index of 11 events per hour.  The events occurred in all body positions and there was moderate to loud snoring noted throughout.  OXYGEN DATA:  There was O2 desaturation as low as 84% with the patient's obstructive events.  CARDIAC DATA:  No clinically significant arrhythmias were noted.  MOVEMENT-PARASOMNIA:  The patient had no significant leg jerks or other abnormal behavior seen.  IMPRESSIONS-RECOMMENDATION:  Mild obstructive sleep apnea/hypopnea syndrome with an apnea/hypopnea index of 11 events per hour and O2 desaturation as low as 84%.  Treatment for this degree of sleep apnea can include a trial of weight loss alone, upper airway surgery, dental appliance, and also CPAP.  Clinical correlation is suggested.     Barbaraann Share, MD,FCCP Diplomate, American Board of Sleep Medicine    [facility]/MEDQ  D:  11/27/2011 08:43:11  T:  11/27/2011 11:28:43  Job:  [id number]

## 2011-11-27 NOTE — Telephone Encounter (Signed)
Per KC, pt needs ov with him to discuss sleep study results. Called and spoke with pt.  Pt scheduled to be seen tomorrow at 9:00 am

## 2011-11-28 ENCOUNTER — Ambulatory Visit (INDEPENDENT_AMBULATORY_CARE_PROVIDER_SITE_OTHER): Payer: Medicare Other | Admitting: Pulmonary Disease

## 2011-11-28 ENCOUNTER — Encounter: Payer: Self-pay | Admitting: Pulmonary Disease

## 2011-11-28 ENCOUNTER — Telehealth: Payer: Self-pay | Admitting: Pulmonary Disease

## 2011-11-28 VITALS — BP 102/60 | HR 70 | Temp 98.6°F | Ht 66.0 in | Wt 216.8 lb

## 2011-11-28 DIAGNOSIS — G4733 Obstructive sleep apnea (adult) (pediatric): Secondary | ICD-10-CM

## 2011-11-28 NOTE — Progress Notes (Signed)
  Subjective:    Patient ID: [name] [name], female    DOB: Nov 02, 1940, 71 y.o.   MRN: [id number]  HPI The patient comes in today for followup of her recent sleep study.  She was found to have mild OSA with an AHI of 11 events per hour.  I have reviewed this study with her in detail, as well as her various treatment options.   Review of Systems  Constitutional: Negative for fever and unexpected weight change.  HENT: Positive for congestion and sinus pressure. Negative for ear pain, nosebleeds, sore throat, rhinorrhea, sneezing, trouble swallowing, dental problem and postnasal drip.   Eyes: Negative for redness and itching.  Respiratory: Positive for cough. Negative for chest tightness, shortness of breath and wheezing.   Cardiovascular: Negative for palpitations and leg swelling.  Gastrointestinal: Negative for nausea and vomiting.  Genitourinary: Negative for dysuria.  Musculoskeletal: Negative for joint swelling.  Skin: Negative for rash.  Neurological: Positive for headaches.  Hematological: Does not bruise/bleed easily.  Psychiatric/Behavioral: Negative for dysphoric mood. The patient is not nervous/anxious.        Objective:   Physical Exam Obese female in no acute distress Nose without purulence or discharge noted Minimal lower extremity edema, no cyanosis Alert and oriented, moves all 4 extremities.       Assessment & Plan:

## 2011-11-28 NOTE — Assessment & Plan Note (Signed)
The patient has mild obstructive sleep apnea by her recent study, and has definite sleep disruption and daytime symptoms.  This degree of sleep apnea really does not represent any significant cardiovascular risk for her, and therefore her decision to treat should be based on its impact to her quality of life.  I have outlined a conservative path of weight loss alone versus CPAP or dental appliance while working on weight loss.  I have answered all of her questions, and given her a booklet on the various treatment options.  The patient will review the educational material, discuss with her family, and let me know what she decides.

## 2011-11-28 NOTE — Patient Instructions (Signed)
Think about options of taking next 6mos to work on weight loss vs active treatment with cpap or dental appliance while working on weight.  Review the booklet I have given you, and let me know what you decide.

## 2011-12-06 ENCOUNTER — Other Ambulatory Visit: Payer: Self-pay | Admitting: Obstetrics and Gynecology

## 2011-12-06 DIAGNOSIS — Z1231 Encounter for screening mammogram for malignant neoplasm of breast: Secondary | ICD-10-CM

## 2011-12-28 ENCOUNTER — Other Ambulatory Visit: Payer: Self-pay | Admitting: Internal Medicine

## 2012-01-02 ENCOUNTER — Ambulatory Visit: Payer: Medicare Other

## 2012-01-04 ENCOUNTER — Other Ambulatory Visit: Payer: Self-pay | Admitting: Internal Medicine

## 2012-01-09 ENCOUNTER — Ambulatory Visit
Admission: RE | Admit: 2012-01-09 | Discharge: 2012-01-09 | Disposition: A | Discharge status: Home / Self Care | Payer: Medicare Other | Source: Ambulatory Visit | Attending: Obstetrics and Gynecology | Admitting: Obstetrics and Gynecology

## 2012-01-09 DIAGNOSIS — Z1231 Encounter for screening mammogram for malignant neoplasm of breast: Secondary | ICD-10-CM | POA: Diagnosis not present

## 2012-01-15 ENCOUNTER — Other Ambulatory Visit: Payer: Self-pay | Admitting: Obstetrics and Gynecology

## 2012-01-15 DIAGNOSIS — R928 Other abnormal and inconclusive findings on diagnostic imaging of breast: Secondary | ICD-10-CM

## 2012-01-23 ENCOUNTER — Other Ambulatory Visit: Payer: Medicare Other

## 2012-01-30 ENCOUNTER — Ambulatory Visit
Admission: RE | Admit: 2012-01-30 | Discharge: 2012-01-30 | Disposition: A | Discharge status: Home / Self Care | Payer: Medicare Other | Source: Ambulatory Visit | Attending: Obstetrics and Gynecology | Admitting: Obstetrics and Gynecology

## 2012-01-30 DIAGNOSIS — R928 Other abnormal and inconclusive findings on diagnostic imaging of breast: Secondary | ICD-10-CM

## 2012-02-06 DIAGNOSIS — H2510 Age-related nuclear cataract, unspecified eye: Secondary | ICD-10-CM | POA: Diagnosis not present

## 2012-02-06 DIAGNOSIS — H1030 Unspecified acute conjunctivitis, unspecified eye: Secondary | ICD-10-CM | POA: Diagnosis not present

## 2012-02-06 DIAGNOSIS — H04129 Dry eye syndrome of unspecified lacrimal gland: Secondary | ICD-10-CM | POA: Diagnosis not present

## 2012-02-17 ENCOUNTER — Encounter: Payer: Self-pay | Admitting: Obstetrics and Gynecology

## 2012-02-17 ENCOUNTER — Ambulatory Visit (INDEPENDENT_AMBULATORY_CARE_PROVIDER_SITE_OTHER): Payer: Medicare Other | Admitting: Obstetrics and Gynecology

## 2012-02-17 VITALS — BP 120/68 | Resp 14 | Ht 66.0 in | Wt 210.0 lb

## 2012-02-17 DIAGNOSIS — N898 Other specified noninflammatory disorders of vagina: Secondary | ICD-10-CM | POA: Insufficient documentation

## 2012-02-17 DIAGNOSIS — R300 Dysuria: Secondary | ICD-10-CM | POA: Insufficient documentation

## 2012-02-17 DIAGNOSIS — R7689 Other specified abnormal immunological findings in serum: Secondary | ICD-10-CM | POA: Insufficient documentation

## 2012-02-17 DIAGNOSIS — R350 Frequency of micturition: Secondary | ICD-10-CM

## 2012-02-17 DIAGNOSIS — L293 Anogenital pruritus, unspecified: Secondary | ICD-10-CM

## 2012-02-17 DIAGNOSIS — R768 Other specified abnormal immunological findings in serum: Secondary | ICD-10-CM

## 2012-02-17 DIAGNOSIS — R894 Abnormal immunological findings in specimens from other organs, systems and tissues: Secondary | ICD-10-CM

## 2012-02-17 LAB — POCT URINALYSIS DIPSTICK
Bilirubin, UA: NEGATIVE
Blood, UA: NEGATIVE
Glucose, UA: NEGATIVE
Leukocytes, UA: NEGATIVE
Nitrite, UA: NEGATIVE
Protein, UA: NEGATIVE
Spec Grav, UA: <=1.005
Urobilinogen, UA: 1
pH, UA: 8

## 2012-02-17 LAB — POCT WET PREP (WET MOUNT)
Bacteria Wet Prep HPF POC: NEGATIVE
Clue Cells Wet Prep Whiff POC: NEGATIVE
KOH Wet Prep POC: NEGATIVE
Trichomonas Wet Prep HPF POC: NEGATIVE
WBC, Wet Prep HPF POC: NEGATIVE
pH: 5

## 2012-02-17 MED ORDER — ACYCLOVIR 400 MG PO TABS: ORAL_TABLET | ORAL | Status: DC

## 2012-02-17 NOTE — Progress Notes (Signed)
Patient ID: [name] [name], female   DOB: 10-07-1940, 71 y.o.   MRN: [id number] Color: white Odor: yes Itching:no Thin:yes Thick:yes Fever:no Dyspareunia:no Hx PID:no HX STD: HSV II Pelvic Pain:no Desires Gc/CT:no Desires HIV,RPR,HbsAG:no  Subjective: The patient gives a 3 week history of dysuria with a strong odor to her urine. She saw her primary care physician in March and was told that she had white blood cells in her urine and to followup if she developed any urinary symptoms. Since that time the patient has had resolution of those symptoms and is now urinating without difficulty. At the time that this all started she also had some complaint of increased vaginal discharge with mild itching which has now resolved. Has a history of HSV-2 but no actual outbreaks. Will occasionally take acyclovir wound there is irritation and in the presacral area where outbreak has occurred.  Is considering returning to sexual activity.  Objective: BP 120/68  Resp 14  Ht 5\' 6"  (1.676 m)  Wt 210 lb (95.255 kg)  BMI 33.89 kg/m2  Pelvic exam: VULVA: normal appearing vulva with no masses, tenderness or lesions,    VAGINA: atrophic, minimal discharge,    CERVIX: normal appearing cervix without discharge or lesions, surgically absent,    UTERUS: surgically absent, vaginal cuff well healed,    ADNEXA: no masses. Urinalysis: Negative Wet prep: Negative  Impression: Urinary symptoms which are now resolved, but with history of pyuria Normal vaginal discharge  Recommendation Urine culture and sensitivity Followup for annual examination or as needed Consider using acyclovir daily for HSV-2 suppression is returning to sexual activity is contemplated

## 2012-02-19 LAB — URINE CULTURE
Colony Count: NO GROWTH
Organism ID, Bacteria: NO GROWTH

## 2012-06-10 ENCOUNTER — Ambulatory Visit (INDEPENDENT_AMBULATORY_CARE_PROVIDER_SITE_OTHER): Payer: Medicare Other | Admitting: Obstetrics and Gynecology

## 2012-06-10 ENCOUNTER — Encounter: Payer: Self-pay | Admitting: Obstetrics and Gynecology

## 2012-06-10 VITALS — BP 112/64 | HR 80 | Resp 14 | Ht 66.0 in | Wt 206.0 lb

## 2012-06-10 DIAGNOSIS — B009 Herpesviral infection, unspecified: Secondary | ICD-10-CM | POA: Diagnosis not present

## 2012-06-10 MED ORDER — ACYCLOVIR 400 MG PO TABS: ORAL_TABLET | ORAL | Status: DC

## 2012-06-10 NOTE — Progress Notes (Signed)
ANNUAL:  Last Pap: 05/2002 WNL: Yes Regular Periods:no Contraception: HYST  Monthly Breast exam:no Tetanus<56yrs:yes Nl.Bladder Function:yes Daily BMs:yes Healthy Diet:yes Calcium:no Mammogram:yes Date of Mammogram: 12/27/2011 Exercise:no Have often Exercise: pt states for last couple of months she has not been able to since she is keeping her grandson. Seatbelt: yes Abuse at home: no Stressful work:no Sigmoid-colonoscopy: 2013 "Polyp" Bone Density: Yes 05/2010 "WNL" PCP: Dr.Panosh Change in PMH: No changes Change in FMH:No Changes   Subjective:    [name] [name] is a 71 y.o. female G3P3 who presents for annual exam.  The patient has no complaints today. Has had no HSV outbreaks, and no vulvar itching. Continues with daily suppressive therapy  The following portions of the patient's history were reviewed and updated as appropriate: allergies, current medications, past family history, past medical history, past social history, past surgical history and problem list.  Review of Systems Pertinent is in HPI Gastrointestinal:No change in bowel habits, no abdominal pain, no rectal bleeding Genitourinary:negative for dysuria, frequency, hematuria, nocturia and urinary incontinence    Objective:     BP 112/64  Resp 14  Ht 5\' 6"  (1.676 m)  Wt 206 lb (93.441 kg)  BMI 33.25 kg/m2  Weight:  Wt Readings from Last 1 Encounters:  06/10/12 206 lb (93.441 kg)     BMI: Body mass index is 33.25 kg/(m^2). General Appearance: Alert, appropriate appearance for age. No acute distress HEENT: Grossly normal Neck / Thyroid: Supple, no masses, nodes or enlargement Lungs: clear to auscultation bilaterally Back: No CVA tenderness Breast Exam: No dimpling, nipple retraction or discharge. No masses or nodes. and No masses or nodes.No dimpling, nipple retraction or discharge. Cardiovascular: Regular rate and rhythm. S1, S2, no murmur Gastrointestinal: Soft, non-tender, no masses or  organomegaly Pelvic Exam: External genitalia: normal general appearance Vaginal: atrophic mucosa and vaginal vault, well supported and suspended Cervix: removed surgically Adnexa: non palpable Uterus: removed surgically Exam limited by anxiety and body habitus Rectovaginal: normal rectal, no masses Lymphatic Exam: Non-palpable nodes in neck, clavicular, axillary, or inguinal regions Skin: no rash or abnormalities Neurologic: Normal gait and speech, no tremor  Psychiatric: Alert and oriented, appropriate affect.    Urinalysis:Not done    Assessment:    s/p hysterectomy  HSV II with rare outbreaks Last [facility] breast and pelvic before 2010    Plan:   mammogram return annually or prn Continue Valtrex supression per pt request Follow-up:  DXA

## 2012-06-17 DIAGNOSIS — M25569 Pain in unspecified knee: Secondary | ICD-10-CM | POA: Diagnosis not present

## 2012-06-23 ENCOUNTER — Other Ambulatory Visit (INDEPENDENT_AMBULATORY_CARE_PROVIDER_SITE_OTHER): Payer: Medicare Other | Admitting: Internal Medicine

## 2012-06-23 ENCOUNTER — Ambulatory Visit (INDEPENDENT_AMBULATORY_CARE_PROVIDER_SITE_OTHER): Payer: Medicare Other | Admitting: Family Medicine

## 2012-06-23 DIAGNOSIS — Z23 Encounter for immunization: Secondary | ICD-10-CM | POA: Diagnosis not present

## 2012-06-30 ENCOUNTER — Other Ambulatory Visit: Payer: Medicare Other

## 2012-06-30 ENCOUNTER — Ambulatory Visit (INDEPENDENT_AMBULATORY_CARE_PROVIDER_SITE_OTHER): Payer: Medicare Other

## 2012-06-30 DIAGNOSIS — Z0000 Encounter for general adult medical examination without abnormal findings: Secondary | ICD-10-CM

## 2012-06-30 DIAGNOSIS — M899 Disorder of bone, unspecified: Secondary | ICD-10-CM | POA: Diagnosis not present

## 2012-06-30 DIAGNOSIS — M949 Disorder of cartilage, unspecified: Secondary | ICD-10-CM | POA: Diagnosis not present

## 2012-06-30 DIAGNOSIS — N951 Menopausal and female climacteric states: Secondary | ICD-10-CM

## 2012-06-30 DIAGNOSIS — Z780 Asymptomatic menopausal state: Secondary | ICD-10-CM

## 2012-06-30 DIAGNOSIS — Z13820 Encounter for screening for osteoporosis: Secondary | ICD-10-CM | POA: Diagnosis not present

## 2012-06-30 DIAGNOSIS — Z139 Encounter for screening, unspecified: Secondary | ICD-10-CM

## 2012-06-30 DIAGNOSIS — R937 Abnormal findings on diagnostic imaging of other parts of musculoskeletal system: Secondary | ICD-10-CM

## 2012-06-30 DIAGNOSIS — Z13828 Encounter for screening for other musculoskeletal disorder: Secondary | ICD-10-CM

## 2012-06-30 DIAGNOSIS — E079 Disorder of thyroid, unspecified: Secondary | ICD-10-CM

## 2012-07-13 DIAGNOSIS — H35379 Puckering of macula, unspecified eye: Secondary | ICD-10-CM | POA: Diagnosis not present

## 2012-07-13 DIAGNOSIS — H04129 Dry eye syndrome of unspecified lacrimal gland: Secondary | ICD-10-CM | POA: Diagnosis not present

## 2012-07-13 DIAGNOSIS — H2510 Age-related nuclear cataract, unspecified eye: Secondary | ICD-10-CM | POA: Diagnosis not present

## 2012-07-14 ENCOUNTER — Telehealth: Payer: Self-pay | Admitting: Family Medicine

## 2012-07-14 NOTE — Telephone Encounter (Signed)
This patient will be having cataract removal and will be under local anesthesia for 30 minutes.

## 2012-07-29 DIAGNOSIS — M25569 Pain in unspecified knee: Secondary | ICD-10-CM | POA: Diagnosis not present

## 2012-08-27 ENCOUNTER — Ambulatory Visit (INDEPENDENT_AMBULATORY_CARE_PROVIDER_SITE_OTHER): Payer: Medicare Other | Admitting: Internal Medicine

## 2012-08-27 ENCOUNTER — Encounter: Payer: Self-pay | Admitting: Internal Medicine

## 2012-08-27 VITALS — BP 110/72 | HR 82 | Temp 98.4°F | Wt 197.0 lb

## 2012-08-27 DIAGNOSIS — T887XXA Unspecified adverse effect of drug or medicament, initial encounter: Secondary | ICD-10-CM | POA: Diagnosis not present

## 2012-08-27 DIAGNOSIS — J069 Acute upper respiratory infection, unspecified: Secondary | ICD-10-CM | POA: Diagnosis not present

## 2012-08-27 DIAGNOSIS — R7309 Other abnormal glucose: Secondary | ICD-10-CM

## 2012-08-27 DIAGNOSIS — E559 Vitamin D deficiency, unspecified: Secondary | ICD-10-CM

## 2012-08-27 DIAGNOSIS — M25569 Pain in unspecified knee: Secondary | ICD-10-CM

## 2012-08-27 DIAGNOSIS — R252 Cramp and spasm: Secondary | ICD-10-CM | POA: Diagnosis not present

## 2012-08-27 DIAGNOSIS — E785 Hyperlipidemia, unspecified: Secondary | ICD-10-CM

## 2012-08-27 DIAGNOSIS — M25561 Pain in right knee: Secondary | ICD-10-CM

## 2012-08-27 MED ORDER — PRAVASTATIN SODIUM 20 MG PO TABS: 20.0000 mg | ORAL_TABLET | Freq: Every day | ORAL | Status: DC

## 2012-08-27 NOTE — Progress Notes (Signed)
Chief Complaint  Patient presents with  . Spasms    Stopped taking the simvastatin and symptoms stopped.  Started taking it again and now the symptoms have returned.  Pain is all over her body.  . Knee Pain    right under ortho care     HPI: Patient comes in today for follow up of  multiple medical problems.   Had increasing muscle pain and leg cramps that even affected mobility . Stopped the simva  And didi better    Then re challened and sx cam back .  stopped for about 2-3 weeks . Fighting a cold   For about 2 weeks using otc  Cold and sinus  Achy at times but no fever   Mild  throat congestion and  Mild cough no fever cp sob ;thought getting better but waxing and waning  . To volunteer for homeless today  4 month old grand child has been sick    ROS: See pertinent positives and negatives per HPI. Disc right knee  Had pain and given cortisone shot and was worse  Saw Dr Rennis Chris ? Fell and crepitus in knee then developed medial pain.  Now sore behind knee  Asks advice .  Past Medical History  Diagnosis Date  . GERD (gastroesophageal reflux disease)   . Hypertension   . Hyperlipidemia   . Thyroid disease   . Pulsatile tinnitus     had MRA and MPV nl mild carotid dopplers2010  . Hyperglycemia   . Headache   . Cough     ent evaluation  . Abdominal pain      Neg ct [place] and mri of back   . DJD (degenerative joint disease) of lumbar spine   . Adenomatous colon polyp   . Allergy   . Anxiety   . History of positive PPD 10/16/2011  . Estrogen deficiency   . IBS (irritable bowel syndrome)   . Anemia   . Vulvitis   . HSV-2 infection   . Gastritis   . Colon polyp   . Shingles 02/09/2011    Right upper face and eyelid but not involving the eye at this point.     Family History  Problem Relation Age of Onset  . Alzheimer's disease Mother   . Heart attack Father   . Asthma Sister   . Colon cancer Sister   . Heart attack Brother   . Colon polyps Sister   . Colon cancer Sister   .  Colon cancer Paternal Aunt     History   Social History  . Marital Status: Single    Spouse Name: widowed.     Number of Children: Y  . Years of Education: N/A   Occupational History  . retired.     [employer]----now retired.  .     Social History Main Topics  . Smoking status: Former Smoker -- 0.5 packs/day for 23 years    Types: Cigarettes    Quit date: 06/16/1984  . Smokeless tobacco: Never Used  . Alcohol Use: No  . Drug Use: No  . Sexually Active: Not Currently -- Female partner(s)    Birth Control/ Protection: Surgical   Other Topics Concern  . None   Social History Narrative   Retired former smoker widowed fall 2010 from myeloma. Lives alone. [address]  No pets    Outpatient Encounter Prescriptions as of 08/27/2012  Medication Sig Dispense Refill  . acetaminophen (TYLENOL) 650 MG CR tablet Take 650 mg by  mouth every 8 (eight) hours as needed.        Marland Kitchen acyclovir (ZOVIRAX) 400 MG tablet Take PO BID PRN  60 tablet  12  . Cholecalciferol (VITAMIN D) 1000 UNITS capsule Take 1,000 Units by mouth daily as needed.       . fish oil-omega-3 fatty acids 1000 MG capsule Take 2 g by mouth daily.        . fluticasone (FLONASE) 50 MCG/ACT nasal spray INHALE 2 SPRAYS IN EACH NOSTRIL EVERY DAY FOR RHINITIS  16 g  2  . omeprazole (PRILOSEC) 20 MG capsule Take 20 mg by mouth daily.       Marland Kitchen SYNTHROID 100 MCG tablet TAKE 1 TABLET (100 MCG TOTAL) BY MOUTH DAILY.  90 tablet  3  . triamterene-hydrochlorothiazide (MAXZIDE-25) 37.5-25 MG per tablet TAKE 1 TABLET BY MOUTH EVERY DAY  90 tablet  1  . pravastatin (PRAVACHOL) 20 MG tablet Take 1 tablet (20 mg total) by mouth daily.  90 tablet  1  . simvastatin (ZOCOR) 40 MG tablet TAKE 1 TABLET BY MOUTH AT BEDTIME  90 tablet  3  . [DISCONTINUED] glucosamine-chondroitin 500-400 MG tablet Take 1 tablet by mouth 3 (three) times daily.          EXAM:  BP 110/72  Pulse 82  Temp 98.4 F (36.9 C) (Oral)  Wt 197 lb (89.359 kg)  SpO2 99%  There is no  height on file to calculate BMI.  GENERAL: vitals reviewed and listed above, alert, oriented, appears well hydrated and in no acute distress  HEENT: atraumatic, conjunctiva  clear, no obvious abnormalities on inspection of external nose and ears OP : no lesion edema or exudate  tms nl minimal congestion   NECK: no obvious masses on inspection palpation  No bruit or adenopathy   LUNGS: clear to auscultation bilaterally, no wheezes, rales or rhonchi, good air movement  CV: HRRR, no clubbing cyanosis or  peripheral edema nl cap refill   MS: moves all extremities  Abnormality  Gait minimally antalgic   PSYCH: pleasant and cooperative, no obvious depression or anxiety Lab Results  Component Value Date   WBC 6.8 10/16/2011   HGB 13.3 10/16/2011   HCT 41.3 10/16/2011   PLT 161.0 10/16/2011   GLUCOSE 91 10/16/2011   CHOL 176 10/16/2011   TRIG 134.0 10/16/2011   HDL 58.10 10/16/2011   LDLDIRECT 131.1 08/09/2009   LDLCALC 91 10/16/2011   ALT 18 10/16/2011   AST 34 10/16/2011   NA 137 10/16/2011   K 6.2 Hemolyzed* 10/16/2011   CL 103 10/16/2011   CREATININE 0.8 10/16/2011   BUN 17 10/16/2011   CO2 27 10/16/2011   TSH 0.75 10/16/2011   INR 0.9 04/14/2007   HGBA1C 6.1 10/16/2011    ASSESSMENT AND PLAN: Due for labs soon anyway.  Discussed the following assessment and plan:  1. Medication side effect  pravastatin (PRAVACHOL) 20 MG tablet, Basic metabolic panel, CBC with Differential, Hepatic function panel, Lipid panel, TSH, CK, Hemoglobin A1c   simva statin myalgia   2. Protracted URI  pravastatin (PRAVACHOL) 20 MG tablet, Basic metabolic panel, CBC with Differential, Hepatic function panel, Lipid panel, TSH, CK, Hemoglobin A1c  3. Knee pain, right  pravastatin (PRAVACHOL) 20 MG tablet, Basic metabolic panel, CBC with Differential, Hepatic function panel, Lipid panel, TSH, CK, Hemoglobin A1c  4. Muscle cramp  pravastatin (PRAVACHOL) 20 MG tablet, Basic metabolic panel, CBC with Differential, Hepatic function panel, Lipid  panel, TSH, CK, Hemoglobin A1c  better off of simva   5. HYPERLIPIDEMIA  pravastatin (PRAVACHOL) 20 MG tablet, Basic metabolic panel, CBC with Differential, Hepatic function panel, Lipid panel, TSH, CK, Hemoglobin A1c  6. FASTING HYPERGLYCEMIA  Hemoglobin A1c  prob viral infection nnl exam call with alarm features or rx for sin sus infection if progresses -Patient advised to return or notify health care team  immediately if symptoms worsen or persist or new concerns arise.  Patient Instructions  i agree that  The lipid medication caused the problem .   Plan  Try a different medication   That is less likely to cause a problem .      Contact us if you have sx and stop the medication.  Labs about 8 weeks after   Beginning the new medication.  And then ROV .   Your lungs are clear    Today.   Contact us if   Alarm features   Fevers  shortness of breath.        Neta Mends. Panosh M.D.

## 2012-08-27 NOTE — Patient Instructions (Addendum)
i agree that  The lipid medication caused the problem .   Plan  Try a different medication   That is less likely to cause a problem .      Contact us if you have sx and stop the medication.  Labs about 8 weeks after   Beginning the new medication.  And then ROV .   Your lungs are clear    Today.   Contact us if   Alarm features   Fevers  shortness of breath.

## 2012-09-01 DIAGNOSIS — H269 Unspecified cataract: Secondary | ICD-10-CM | POA: Diagnosis not present

## 2012-09-01 DIAGNOSIS — H2510 Age-related nuclear cataract, unspecified eye: Secondary | ICD-10-CM | POA: Diagnosis not present

## 2012-10-01 DIAGNOSIS — H2510 Age-related nuclear cataract, unspecified eye: Secondary | ICD-10-CM | POA: Diagnosis not present

## 2012-10-13 DIAGNOSIS — H2510 Age-related nuclear cataract, unspecified eye: Secondary | ICD-10-CM | POA: Diagnosis not present

## 2012-10-13 DIAGNOSIS — H269 Unspecified cataract: Secondary | ICD-10-CM | POA: Diagnosis not present

## 2012-10-20 ENCOUNTER — Other Ambulatory Visit (INDEPENDENT_AMBULATORY_CARE_PROVIDER_SITE_OTHER): Payer: Medicare Other

## 2012-10-20 DIAGNOSIS — T887XXA Unspecified adverse effect of drug or medicament, initial encounter: Secondary | ICD-10-CM | POA: Diagnosis not present

## 2012-10-20 DIAGNOSIS — E559 Vitamin D deficiency, unspecified: Secondary | ICD-10-CM

## 2012-10-20 DIAGNOSIS — J069 Acute upper respiratory infection, unspecified: Secondary | ICD-10-CM

## 2012-10-20 DIAGNOSIS — E785 Hyperlipidemia, unspecified: Secondary | ICD-10-CM | POA: Diagnosis not present

## 2012-10-20 DIAGNOSIS — R252 Cramp and spasm: Secondary | ICD-10-CM

## 2012-10-20 DIAGNOSIS — M25569 Pain in unspecified knee: Secondary | ICD-10-CM

## 2012-10-20 DIAGNOSIS — R7309 Other abnormal glucose: Secondary | ICD-10-CM | POA: Diagnosis not present

## 2012-10-20 DIAGNOSIS — M25561 Pain in right knee: Secondary | ICD-10-CM

## 2012-10-20 LAB — CBC WITH DIFFERENTIAL/PLATELET
Basophils Absolute: 0 10*3/uL (ref 0.0–0.1)
Basophils Relative: 0.4 % (ref 0.0–3.0)
Eosinophils Absolute: 0.1 10*3/uL (ref 0.0–0.7)
Eosinophils Relative: 2.3 % (ref 0.0–5.0)
HCT: 37.6 % (ref 36.0–46.0)
Hemoglobin: 12.5 g/dL (ref 12.0–15.0)
Lymphocytes Relative: 41.7 % (ref 12.0–46.0)
Lymphs Abs: 2.2 10*3/uL (ref 0.7–4.0)
MCHC: 33.1 g/dL (ref 30.0–36.0)
MCV: 87.4 fl (ref 78.0–100.0)
Monocytes Absolute: 0.5 10*3/uL (ref 0.1–1.0)
Monocytes Relative: 8.6 % (ref 3.0–12.0)
Neutro Abs: 2.5 10*3/uL (ref 1.4–7.7)
Neutrophils Relative %: 47 % (ref 43.0–77.0)
Platelets: 211 10*3/uL (ref 150.0–400.0)
RBC: 4.3 Mil/uL (ref 3.87–5.11)
RDW: 13.8 % (ref 11.5–14.6)
WBC: 5.4 10*3/uL (ref 4.5–10.5)

## 2012-10-20 LAB — HEPATIC FUNCTION PANEL
ALT: 19 U/L (ref 0–35)
AST: 23 U/L (ref 0–37)
Albumin: 3.8 g/dL (ref 3.5–5.2)
Alkaline Phosphatase: 46 U/L (ref 39–117)
Bilirubin, Direct: 0.1 mg/dL (ref 0.0–0.3)
Total Bilirubin: 0.6 mg/dL (ref 0.3–1.2)
Total Protein: 7.2 g/dL (ref 6.0–8.3)

## 2012-10-20 LAB — TSH: TSH: 1.05 u[IU]/mL (ref 0.35–5.50)

## 2012-10-20 LAB — LIPID PANEL
Cholesterol: 172 mg/dL (ref 0–200)
HDL: 49 mg/dL (ref >39.00)
LDL Cholesterol: 104 mg/dL — ABNORMAL HIGH (ref 0–99)
Total CHOL/HDL Ratio: 4
Triglycerides: 93 mg/dL (ref 0.0–149.0)
VLDL: 18.6 mg/dL (ref 0.0–40.0)

## 2012-10-20 LAB — CK: Total CK: 180 U/L — ABNORMAL HIGH (ref 7–177)

## 2012-10-20 LAB — BASIC METABOLIC PANEL
BUN: 22 mg/dL (ref 6–23)
CO2: 29 mEq/L (ref 19–32)
Calcium: 9.5 mg/dL (ref 8.4–10.5)
Chloride: 105 mEq/L (ref 96–112)
Creatinine, Ser: 0.8 mg/dL (ref 0.4–1.2)
GFR: 87.2 mL/min (ref >60.00)
Glucose, Bld: 101 mg/dL — ABNORMAL HIGH (ref 70–99)
Potassium: 4.1 mEq/L (ref 3.5–5.1)
Sodium: 142 mEq/L (ref 135–145)

## 2012-10-20 LAB — HEMOGLOBIN A1C: Hgb A1c MFr Bld: 6.1 % (ref 4.6–6.5)

## 2012-10-21 LAB — VITAMIN D 25 HYDROXY (VIT D DEFICIENCY, FRACTURES): Vit D, 25-Hydroxy: 31 ng/mL (ref 30–89)

## 2012-10-26 ENCOUNTER — Ambulatory Visit: Payer: Medicare Other | Admitting: Internal Medicine

## 2012-11-03 ENCOUNTER — Ambulatory Visit (INDEPENDENT_AMBULATORY_CARE_PROVIDER_SITE_OTHER): Payer: Medicare Other | Admitting: Internal Medicine

## 2012-11-03 ENCOUNTER — Encounter: Payer: Self-pay | Admitting: Internal Medicine

## 2012-11-03 VITALS — BP 124/72 | HR 78 | Temp 98.4°F | Wt 196.0 lb

## 2012-11-03 DIAGNOSIS — Z9889 Other specified postprocedural states: Secondary | ICD-10-CM | POA: Insufficient documentation

## 2012-11-03 DIAGNOSIS — I10 Essential (primary) hypertension: Secondary | ICD-10-CM

## 2012-11-03 DIAGNOSIS — M19019 Primary osteoarthritis, unspecified shoulder: Secondary | ICD-10-CM

## 2012-11-03 DIAGNOSIS — R7309 Other abnormal glucose: Secondary | ICD-10-CM

## 2012-11-03 DIAGNOSIS — E559 Vitamin D deficiency, unspecified: Secondary | ICD-10-CM

## 2012-11-03 DIAGNOSIS — E785 Hyperlipidemia, unspecified: Secondary | ICD-10-CM

## 2012-11-03 NOTE — Progress Notes (Signed)
Chief Complaint  Patient presents with  . Follow-up  . Hyperlipidemia    HPI: Patient comes in today for follow up of  multiple medical problems.   Lipids  Med changes now on the Pravachol seems to do okay occasionally gets foot cramps at night. But nothing like the achiness that she had on the simvastatin.  Still states that she thinks that the right side of her body has a problem almost like a stroke but never has had acute weakness or dysfunction but has had  Remote hx of   Falling in b tub against the shoulder . Right with some right shoulder pain   hx of back sugery  From tumor and some foot drop and neuro deficit right leg.    Hx of shoulder  Surgery left   Has lots of ?s.  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 No chest pain shortness of breath syncope new symptoms  Past Medical History  Diagnosis Date  . GERD (gastroesophageal reflux disease)   . Hypertension   . Hyperlipidemia   . Thyroid disease   . Pulsatile tinnitus     had MRA and MPV nl mild carotid dopplers2010  . Hyperglycemia   . Headache   . Cough     ent evaluation  . Abdominal pain      Neg ct [place] and mri of back   . DJD (degenerative joint disease) of lumbar spine   . Adenomatous colon polyp   . Allergy   . Anxiety   . History of positive PPD 10/16/2011  . Estrogen deficiency   . IBS (irritable bowel syndrome)   . Anemia   . Vulvitis   . HSV-2 infection   . Gastritis   . Colon polyp   . Shingles 02/09/2011    Right upper face and eyelid but not involving the eye at this point.     Family History  Problem Relation Age of  Onset  . Alzheimer's disease Mother   . Heart attack Father   . Asthma Sister   . Colon cancer Sister   . Heart attack Brother   . Colon polyps Sister   . Colon cancer Sister   . Colon cancer Paternal Aunt     History   Social History  . Marital Status: Single    Spouse Name: widowed.     Number of Children: Y  . Years of Education: N/A   Occupational History  . retired.     [employer]----now retired.  .     Social History Main Topics  . Smoking status: Former Smoker -- 0.50 packs/day for 23 years    Types: Cigarettes    Quit date: 06/16/1984  . Smokeless tobacco: Never Used  . Alcohol Use: No  . Drug Use: No  . Sexually Active: Not Currently -- Female partner(s)    Birth Control/ Protection: Surgical   Other Topics Concern  . None   Social History Narrative   Retired    former smoker    widowed fall 2010 from myeloma. Lives alone.    [address]  No pets   Taking care of 66 month old GC    Outpatient Encounter Prescriptions as of 11/03/2012  Medication Sig Dispense Refill  . acetaminophen (TYLENOL) 650 MG CR tablet Take 650 mg by mouth every 8 (eight) hours as needed.        Marland Kitchen acyclovir (ZOVIRAX) 400 MG tablet Take PO BID PRN  60 tablet  12  . Cholecalciferol (VITAMIN D) 1000 UNITS capsule Take 1,000 Units by mouth daily as needed.       . fish oil-omega-3 fatty acids 1000 MG capsule Take 2 g by mouth daily.        . fluticasone (FLONASE) 50 MCG/ACT nasal spray INHALE 2 SPRAYS IN EACH NOSTRIL EVERY DAY FOR RHINITIS  16 g  2  . omeprazole (PRILOSEC) 20 MG capsule Take 20 mg by mouth daily.       . pravastatin (PRAVACHOL) 20 MG tablet Take 1 tablet (20 mg total) by mouth daily.  90 tablet  1  . SYNTHROID 100 MCG tablet TAKE 1 TABLET (100 MCG TOTAL) BY MOUTH DAILY.  90 tablet  3  . triamterene-hydrochlorothiazide (MAXZIDE-25) 37.5-25 MG per tablet TAKE 1 TABLET BY MOUTH EVERY DAY  90 tablet  1   No facility-administered encounter medications on file as of 11/03/2012.     EXAM:  BP 124/72  Pulse 78  Temp(Src) 98.4 F (36.9 C) (Oral)  Wt 196 lb (88.905 kg)  BMI 31.65 kg/m2  SpO2 94%  Body mass index is 31.65 kg/(m^2).  GENERAL: vitals reviewed and listed above, alert, oriented, appears well hydrated and in no acute distress she is nontoxic  HEENT: atraumatic, conjunctiva  clear, no obvious abnormalities on inspection of external nose and ears there is a slight puffiness to the right lower lower lid OP : no lesion edema or exudate   NECK: no obvious masses on inspection palpation   LUNGS: Oral respirations  CV: HRRR, no clubbing cyanosis or  peripheral edema nl cap refill   MS: moves all extremities  slightly antalgic gait moves all extremities no obvious upper extremity weakness  PSYCH: pleasant and cooperative, no obvious depression or anxiety CPK level borderline elevated at 180 of note this was about 1-2 months after stopping the simvastatin Lab Results  Component Value Date   WBC 5.4 10/20/2012   HGB 12.5 10/20/2012   HCT 37.6 10/20/2012   PLT 211.0 10/20/2012   GLUCOSE 101* 10/20/2012   CHOL 172 10/20/2012   TRIG 93.0 10/20/2012   HDL 49.00 10/20/2012   LDLDIRECT 131.1 08/09/2009   LDLCALC 104* 10/20/2012   ALT 19 10/20/2012   AST 23 10/20/2012   NA 142 10/20/2012   K 4.1 10/20/2012   CL 105 10/20/2012   CREATININE 0.8 10/20/2012   BUN 22 10/20/2012   CO2 29 10/20/2012   TSH 1.05 10/20/2012   INR 0.9 04/14/2007   HGBA1C 6.1 10/20/2012     ASSESSMENT AND PLAN:  Discussed the following assessment and plan:  HYPERLIPIDEMIA - Seems to be tolerating the pravastatin at this time to continue doubt if the CPK level is significan  HYPERTENSION - Control  History of back surgery - with residual right leg sx.   Arthritis of shoulder - per ortho   Unspecified vitamin D deficiency  FASTING HYPERGLYCEMIA - a1c acceptable at 6.1 Lab parameters are acceptable otherwise. I suspect some of her symptoms are mechanical gait changes and arthritis  they give her more right-sided symptoms. I don't see any obvious evidence of stroke  however of offered that she can see neurology consult if she's having some kind of progressive symptoms or concerns. Get back to exercising and is having imbalance or other problems contact us in the meantime.    -Patient advised to return or notify health care team  if symptoms worsen or persist or new concerns arise.  Patient Instructions  Cholesterol acceptable.    Today   Continue    The  Borderline   cpk test is probably insignificant  We can follow .  Rest of labs are good.      I  suggest   Get back into exercise program as we discussed .   Contact us if wish a neuro referral.    Consider physical therapy.   Ok to take   2 reg strength  325 mg    Up to  Maximum. 3-4 x per day.         Neta Mends. Panosh M.D.  Total visit > 50% spent counseling and coordinating care

## 2012-11-03 NOTE — Patient Instructions (Addendum)
Cholesterol acceptable.    Today   Continue    The  Borderline   cpk test is probably insignificant  We can follow .  Rest of labs are good.      I  suggest   Get back into exercise program as we discussed .   Contact us if wish a neuro referral.    Consider physical therapy.   Ok to take   2 reg strength  325 mg    Up to  Maximum. 3-4 x per day.

## 2012-11-24 ENCOUNTER — Other Ambulatory Visit: Payer: Self-pay | Admitting: Internal Medicine

## 2012-12-15 ENCOUNTER — Other Ambulatory Visit: Payer: Self-pay

## 2012-12-15 DIAGNOSIS — Z1231 Encounter for screening mammogram for malignant neoplasm of breast: Secondary | ICD-10-CM

## 2012-12-22 ENCOUNTER — Encounter: Payer: Self-pay | Admitting: Pulmonary Disease

## 2012-12-22 ENCOUNTER — Ambulatory Visit (INDEPENDENT_AMBULATORY_CARE_PROVIDER_SITE_OTHER): Payer: Medicare Other | Admitting: Pulmonary Disease

## 2012-12-22 VITALS — BP 110/72 | HR 63 | Temp 98.4°F | Ht 66.0 in | Wt 204.0 lb

## 2012-12-22 DIAGNOSIS — G4733 Obstructive sleep apnea (adult) (pediatric): Secondary | ICD-10-CM

## 2012-12-22 NOTE — Assessment & Plan Note (Addendum)
The patient has lost weight since her visit a year ago, and clearly feels that she is sleeping better and feeling better during the day.  I have again offered her aggressive treatment with CPAP or a dental appliance, but she does not appear to be overly symptomatic at this time.  This is also not a health risk for her.  After a long conversation, she and I have both agreed for her to continue working aggressively on weight loss.  She will call me if she changes her and wants to consider more aggressive treatment.

## 2012-12-22 NOTE — Patient Instructions (Addendum)
Continue to work on weight loss.  If you feel this is impacting your sleep and quality of life, please call and we can consider a trial of cpap.

## 2012-12-22 NOTE — Progress Notes (Signed)
  Subjective:    Patient ID: [name] [name], female    DOB: Mar 15, 1941, 72 y.o.   MRN: [id number]  HPI Patient comes in today for followup of her known mild obstructive sleep apnea.  She has lost 12 pounds since her last visit, and had actually lost more than that prior to the visit.  Since losing weight, she feels her sleep is much improved, and denies any significant sleepiness issues during the day with inactivity.  She does still have some snoring and witnessed apneas.   Review of Systems  Constitutional: Negative for fever and unexpected weight change.  HENT: Positive for rhinorrhea, sneezing, postnasal drip and sinus pressure. Negative for ear pain, nosebleeds, congestion, sore throat, trouble swallowing and dental problem.        Allergies/sinus  Eyes: Negative for redness and itching.  Respiratory: Positive for cough. Negative for chest tightness, shortness of breath and wheezing.   Cardiovascular: Positive for chest pain ( with the "cold" symptoms she is having). Negative for palpitations and leg swelling.  Gastrointestinal: Negative for nausea and vomiting.  Genitourinary: Negative for dysuria.  Musculoskeletal: Negative for joint swelling.  Skin: Negative for rash.  Neurological: Negative for headaches.  Hematological: Does not bruise/bleed easily.  Psychiatric/Behavioral: Negative for dysphoric mood. The patient is not nervous/anxious.        Objective:   Physical Exam Overweight female in no acute distress Nose without purulent discharge noted Neck without lymphadenopathy or thyromegaly Lower extremities without edema, no cyanosis Alert and oriented, does not appear to be sleepy, moves all 4 extremities.       Assessment & Plan:

## 2013-01-13 DIAGNOSIS — M7710 Lateral epicondylitis, unspecified elbow: Secondary | ICD-10-CM | POA: Diagnosis not present

## 2013-01-14 ENCOUNTER — Ambulatory Visit
Admission: RE | Admit: 2013-01-14 | Discharge: 2013-01-14 | Disposition: A | Discharge status: Home / Self Care | Payer: Medicare Other | Source: Ambulatory Visit

## 2013-01-14 DIAGNOSIS — Z1231 Encounter for screening mammogram for malignant neoplasm of breast: Secondary | ICD-10-CM | POA: Diagnosis not present

## 2013-02-01 DIAGNOSIS — M7710 Lateral epicondylitis, unspecified elbow: Secondary | ICD-10-CM | POA: Diagnosis not present

## 2013-02-18 DIAGNOSIS — M7710 Lateral epicondylitis, unspecified elbow: Secondary | ICD-10-CM | POA: Diagnosis not present

## 2013-03-08 ENCOUNTER — Ambulatory Visit: Payer: Medicare Other | Admitting: Internal Medicine

## 2013-03-30 DIAGNOSIS — M5137 Other intervertebral disc degeneration, lumbosacral region: Secondary | ICD-10-CM | POA: Diagnosis not present

## 2013-03-30 DIAGNOSIS — M47817 Spondylosis without myelopathy or radiculopathy, lumbosacral region: Secondary | ICD-10-CM | POA: Diagnosis not present

## 2013-03-30 DIAGNOSIS — IMO0002 Reserved for concepts with insufficient information to code with codable children: Secondary | ICD-10-CM | POA: Diagnosis not present

## 2013-03-30 DIAGNOSIS — M545 Low back pain, unspecified: Secondary | ICD-10-CM | POA: Diagnosis not present

## 2013-04-02 ENCOUNTER — Other Ambulatory Visit: Payer: Self-pay | Admitting: Neurosurgery

## 2013-04-02 DIAGNOSIS — M5416 Radiculopathy, lumbar region: Secondary | ICD-10-CM

## 2013-04-02 DIAGNOSIS — R161 Splenomegaly, not elsewhere classified: Secondary | ICD-10-CM

## 2013-04-08 ENCOUNTER — Ambulatory Visit
Admission: RE | Admit: 2013-04-08 | Discharge: 2013-04-08 | Disposition: A | Discharge status: Home / Self Care | Payer: Medicare Other | Source: Ambulatory Visit | Attending: Neurosurgery | Admitting: Neurosurgery

## 2013-04-08 ENCOUNTER — Encounter: Payer: Self-pay | Admitting: Internal Medicine

## 2013-04-08 ENCOUNTER — Ambulatory Visit (INDEPENDENT_AMBULATORY_CARE_PROVIDER_SITE_OTHER): Payer: Medicare Other | Admitting: Internal Medicine

## 2013-04-08 VITALS — BP 124/72 | HR 61 | Temp 98.5°F | Wt 204.0 lb

## 2013-04-08 DIAGNOSIS — M5416 Radiculopathy, lumbar region: Secondary | ICD-10-CM

## 2013-04-08 DIAGNOSIS — T50905A Adverse effect of unspecified drugs, medicaments and biological substances, initial encounter: Secondary | ICD-10-CM

## 2013-04-08 DIAGNOSIS — R161 Splenomegaly, not elsewhere classified: Secondary | ICD-10-CM

## 2013-04-08 DIAGNOSIS — T887XXA Unspecified adverse effect of drug or medicament, initial encounter: Secondary | ICD-10-CM

## 2013-04-08 DIAGNOSIS — M47817 Spondylosis without myelopathy or radiculopathy, lumbosacral region: Secondary | ICD-10-CM | POA: Diagnosis not present

## 2013-04-08 DIAGNOSIS — D7389 Other diseases of spleen: Secondary | ICD-10-CM | POA: Diagnosis not present

## 2013-04-08 DIAGNOSIS — Z23 Encounter for immunization: Secondary | ICD-10-CM

## 2013-04-08 DIAGNOSIS — D739 Disease of spleen, unspecified: Secondary | ICD-10-CM

## 2013-04-08 DIAGNOSIS — R197 Diarrhea, unspecified: Secondary | ICD-10-CM

## 2013-04-08 DIAGNOSIS — E785 Hyperlipidemia, unspecified: Secondary | ICD-10-CM

## 2013-04-08 DIAGNOSIS — I10 Essential (primary) hypertension: Secondary | ICD-10-CM

## 2013-04-08 HISTORY — DX: Unspecified adverse effect of drug or medicament, initial encounter: T88.7XXA

## 2013-04-08 MED ORDER — IOHEXOL 350 MG/ML SOLN
80.0000 mL | Freq: Once | INTRAVENOUS | Status: AC | PRN
  Administered 2013-04-08: 80 mL via INTRAVENOUS

## 2013-04-08 MED ORDER — GADOBENATE DIMEGLUMINE 529 MG/ML IV SOLN
18.0000 mL | Freq: Once | INTRAVENOUS | Status: AC | PRN
  Administered 2013-04-08: 18 mL via INTRAVENOUS

## 2013-04-08 NOTE — Patient Instructions (Addendum)
Consider trying  Probiotic   To help bowel bacteria and see if helps over the next week or so. Check bp readings occasionally   But ok on repeat. 124/70. Plan labs and wellness visit in march April next year.

## 2013-04-08 NOTE — Progress Notes (Signed)
Chief Complaint  Patient presents with  . Follow-up    HPI: Patient comes in today for follow up of  multiple medical problems.   ? Stomach virus  Gas adn stomach rumbling   Last week and thought it was gone and came back yesterday.  NO fever.  Loose  No blood .  Gi illness in family. Takes care of family toddler who had a GI illness. Also has a history of irritable bowel. No blood in stool no fever.  Lots going on.  Dr Venetia Maxon  Evaluating and mri  . Right leg pain see past notes. Scans showed something in the spleen that could be an old cyst she is getting an MRI today and a scan of her abdomen to check this area.  Stopped pravastatin;Had bad cramps all over  When took pravastatin  Onset  After amonth or so and then went away.  And stayed off and went back on.   Blood pressure generally well but hasn't checked it.  Was supposed to get labs for her cholesterol but uncertain if she really needs it because she stopped pravastatin. ROS: See pertinent positives and negatives per HPI.  Past Medical History  Diagnosis Date  . GERD (gastroesophageal reflux disease)   . Hypertension   . Hyperlipidemia   . Thyroid disease   . Pulsatile tinnitus     had MRA and MPV nl mild carotid dopplers2010  . Hyperglycemia   . Headache(784.0)   . Cough     ent evaluation  . Abdominal pain      Neg ct [place] and mri of back   . DJD (degenerative joint disease) of lumbar spine   . Adenomatous colon polyp   . Allergy   . Anxiety   . History of positive PPD 10/16/2011  . Estrogen deficiency   . IBS (irritable bowel syndrome)   . Anemia   . Vulvitis   . HSV-2 infection   . Gastritis   . Colon polyp   . Shingles 02/09/2011    Right upper face and eyelid but not involving the eye at this point.     Family History  Problem Relation Age of Onset  . Alzheimer's disease Mother   . Heart attack Father   . Asthma Sister   . Colon cancer Sister   . Heart attack Brother   . Colon polyps Sister   . Colon  cancer Sister   . Colon cancer Paternal Aunt     History   Social History  . Marital Status: Single    Spouse Name: widowed.     Number of Children: Y  . Years of Education: N/A   Occupational History  . retired.     [employer]----now retired.  .     Social History Main Topics  . Smoking status: Former Smoker -- 0.50 packs/day for 23 years    Types: Cigarettes    Quit date: 06/16/1984  . Smokeless tobacco: Never Used  . Alcohol Use: No  . Drug Use: No  . Sexual Activity: Not Currently    Partners: Male    Birth Control/ Protection: Surgical   Other Topics Concern  . None   Social History Narrative   Retired    former smoker    widowed fall 2010 from myeloma. Lives alone.    [address]  No pets   Taking care of 22 month old GC    Outpatient Encounter Prescriptions as of 04/08/2013  Medication Sig Dispense Refill  .  acyclovir (ZOVIRAX) 400 MG tablet Take PO BID PRN  60 tablet  12  . Cholecalciferol (VITAMIN D) 1000 UNITS capsule Take 1,000 Units by mouth daily as needed.       . fish oil-omega-3 fatty acids 1000 MG capsule Take 2 g by mouth daily.        . fluticasone (FLONASE) 50 MCG/ACT nasal spray INHALE 2 SPRAYS IN EACH NOSTRIL EVERY DAY FOR RHINITIS  16 g  2  . ibuprofen (ADVIL,MOTRIN) 600 MG tablet Take 600 mg by mouth every 6 (six) hours as needed for pain.      Marland Kitchen omeprazole (PRILOSEC) 20 MG capsule Take 20 mg by mouth daily.       Marland Kitchen SYNTHROID 100 MCG tablet TAKE 1 TABLET (100 MCG TOTAL) BY MOUTH DAILY.  90 tablet  3  . triamterene-hydrochlorothiazide (MAXZIDE-25) 37.5-25 MG per tablet TAKE 1 TABLET BY MOUTH EVERY DAY  90 tablet  2  . [DISCONTINUED] acetaminophen (TYLENOL) 650 MG CR tablet Take 650 mg by mouth every 8 (eight) hours as needed.        . [DISCONTINUED] pravastatin (PRAVACHOL) 20 MG tablet Take 1 tablet (20 mg total) by mouth daily.  90 tablet  1   No facility-administered encounter medications on file as of 04/08/2013.    EXAM:  BP 124/72  Pulse 61   Temp(Src) 98.5 F (36.9 C) (Oral)  Wt 204 lb (92.534 kg)  BMI 32.94 kg/m2  SpO2 98%  Body mass index is 32.94 kg/(m^2). Repeat blood pressure 124/72. GENERAL: vitals reviewed and listed above, alert, oriented, appears well hydrated and in no acute distress HEENT: atraumatic, conjunctiva  clear, no obvious abnormalities on inspection of external nose and ears  NECK: no obvious masses on inspection palpation  LUNGS: clear to auscultation bilaterally, no wheezes, rales or rhonchi, good air movement CV: HRRR, no clubbing cyanosis or  peripheral edema nl cap refill  MS: moves all extremities without noticeable focal  Abnormality mildly antalgic gait. Abdomen I don't feel a mass states she she might be tender on the left side. No guarding or rebound PSYCH: pleasant and cooperative, no obvious depression or anxiety Lab Results  Component Value Date   WBC 5.4 10/20/2012   HGB 12.5 10/20/2012   HCT 37.6 10/20/2012   PLT 211.0 10/20/2012   GLUCOSE 101* 10/20/2012   CHOL 172 10/20/2012   TRIG 93.0 10/20/2012   HDL 49.00 10/20/2012   LDLDIRECT 131.1 08/09/2009   LDLCALC 104* 10/20/2012   ALT 19 10/20/2012   AST 23 10/20/2012   NA 142 10/20/2012   K 4.1 10/20/2012   CL 105 10/20/2012   CREATININE 0.8 10/20/2012   BUN 22 10/20/2012   CO2 29 10/20/2012   TSH 1.05 10/20/2012   INR 0.9 04/14/2007   HGBA1C 6.1 10/20/2012    ASSESSMENT AND PLAN:  Discussed the following assessment and plan:  Diarrhea - Possibly postinfectious irritable bowel try probiotic followup if persistent progressive or alarm features  Need for prophylactic vaccination and inoculation against influenza - Plan: Flu Vaccine QUAD 36+ mos PF IM (Fluarix)  HYPERLIPIDEMIA  HYPERTENSION  Lesion of spleen - Been evaluated today by scan it appears she may have had a splenic cyst documented years ago as an incidental finding.  Medication side effect, initial encounter - Pravastatin patient went off and rechallenged and symptoms recurred  muscle cramps /foot cramps Total visit > 50% spent counseling and coordinating care   -Patient advised to return or notify health care team  if new concerns arise.  Patient Instructions  Consider trying  Probiotic   To help bowel bacteria and see if helps over the next week or so. Check bp readings occasionally   But ok on repeat. 124/70. Plan labs and wellness visit in march April next year.    Neta Mends. Panosh M.D.

## 2013-04-13 ENCOUNTER — Other Ambulatory Visit: Payer: Self-pay | Admitting: Internal Medicine

## 2013-05-05 DIAGNOSIS — M47817 Spondylosis without myelopathy or radiculopathy, lumbosacral region: Secondary | ICD-10-CM | POA: Diagnosis not present

## 2013-05-05 DIAGNOSIS — M545 Low back pain, unspecified: Secondary | ICD-10-CM | POA: Diagnosis not present

## 2013-05-05 DIAGNOSIS — M5137 Other intervertebral disc degeneration, lumbosacral region: Secondary | ICD-10-CM | POA: Diagnosis not present

## 2013-05-05 DIAGNOSIS — IMO0002 Reserved for concepts with insufficient information to code with codable children: Secondary | ICD-10-CM | POA: Diagnosis not present

## 2013-05-06 ENCOUNTER — Ambulatory Visit: Payer: Medicare Other | Admitting: Internal Medicine

## 2013-05-19 DIAGNOSIS — H43319 Vitreous membranes and strands, unspecified eye: Secondary | ICD-10-CM | POA: Diagnosis not present

## 2013-05-19 DIAGNOSIS — H04129 Dry eye syndrome of unspecified lacrimal gland: Secondary | ICD-10-CM | POA: Diagnosis not present

## 2013-05-19 DIAGNOSIS — H35379 Puckering of macula, unspecified eye: Secondary | ICD-10-CM | POA: Diagnosis not present

## 2013-05-19 DIAGNOSIS — H35369 Drusen (degenerative) of macula, unspecified eye: Secondary | ICD-10-CM | POA: Diagnosis not present

## 2013-05-21 DIAGNOSIS — IMO0002 Reserved for concepts with insufficient information to code with codable children: Secondary | ICD-10-CM | POA: Diagnosis not present

## 2013-06-22 DIAGNOSIS — H43819 Vitreous degeneration, unspecified eye: Secondary | ICD-10-CM | POA: Diagnosis not present

## 2013-06-22 DIAGNOSIS — H33199 Other retinoschisis and retinal cysts, unspecified eye: Secondary | ICD-10-CM | POA: Diagnosis not present

## 2013-06-22 DIAGNOSIS — H35039 Hypertensive retinopathy, unspecified eye: Secondary | ICD-10-CM | POA: Diagnosis not present

## 2013-06-23 ENCOUNTER — Ambulatory Visit (INDEPENDENT_AMBULATORY_CARE_PROVIDER_SITE_OTHER): Payer: Medicare Other | Admitting: Internal Medicine

## 2013-06-23 ENCOUNTER — Encounter: Payer: Self-pay | Admitting: Internal Medicine

## 2013-06-23 VITALS — BP 116/64 | HR 58 | Temp 98.4°F | Wt 200.0 lb

## 2013-06-23 DIAGNOSIS — R252 Cramp and spasm: Secondary | ICD-10-CM

## 2013-06-23 DIAGNOSIS — I10 Essential (primary) hypertension: Secondary | ICD-10-CM

## 2013-06-23 DIAGNOSIS — E039 Hypothyroidism, unspecified: Secondary | ICD-10-CM | POA: Diagnosis not present

## 2013-06-23 LAB — BASIC METABOLIC PANEL
BUN: 23 mg/dL (ref 6–23)
CO2: 26 mEq/L (ref 19–32)
Calcium: 9.4 mg/dL (ref 8.4–10.5)
Chloride: 104 mEq/L (ref 96–112)
Creatinine, Ser: 0.9 mg/dL (ref 0.4–1.2)
GFR: 81.36 mL/min (ref >60.00)
Glucose, Bld: 95 mg/dL (ref 70–99)
Potassium: 4.3 mEq/L (ref 3.5–5.1)
Sodium: 140 mEq/L (ref 135–145)

## 2013-06-23 LAB — MAGNESIUM: Magnesium: 2 mg/dL (ref 1.5–2.5)

## 2013-06-23 LAB — TSH: TSH: 0.48 u[IU]/mL (ref 0.35–5.50)

## 2013-06-23 LAB — CK: Total CK: 159 U/L (ref 7–177)

## 2013-06-23 MED ORDER — FLUTICASONE PROPIONATE 50 MCG/ACT NA SUSP: NASAL | Status: DC

## 2013-06-23 NOTE — Progress Notes (Signed)
Chief Complaint  Patient presents with  . Follow-up    HPI: Patient comes in today  for problem evaluation.  She called last week for an earlier appointment because of problems with spasms and muscle cramps. See past notes we have stopped the simvastatin and then tried pravastatin she stopped both of them but is still having a problem with spasms.  She did see her back surgeon Dr. Venetia Maxon who did imaging studies MRI /CT.Marland Kitchen Had injection in her back that helped her knee and her leg pain but she still has spasms   Had ns  evla and  Neg   Surgical problem  Hard top drive and advised injection to try  Right side   Called last week  cuase had left leg sx  Leg felt like a stick and left foot curled up .    Pain ffrom spasm . nocfturnal more commonly but gets spasms when she's driving the car and has to straighten out her leg.  ocass gets Hand cramps and it is cool in both spasms she had a hard time fitting and had to warm her hands up to be able to vote last week. Marland Kitchen  Has had hand surgery for nodules in the past but no specific weakness she does have arthritis is taking occasional ibuprofen .   Some pain in leg calf after walking a bit. Also driving  Have to work legs. Wonders about her circulation. Doesn't have true claudication but does have some pain with walking long distances  ROS: See pertinent positives and negatives per HPI. No current chest pain shortness of breath new  Past Medical History  Diagnosis Date  . GERD (gastroesophageal reflux disease)   . Hypertension   . Hyperlipidemia   . Thyroid disease   . Pulsatile tinnitus     had MRA and MPV nl mild carotid dopplers2010  . Hyperglycemia   . Headache(784.0)   . Cough     ent evaluation  . Abdominal pain      Neg ct [place] and mri of back   . DJD (degenerative joint disease) of lumbar spine   . Adenomatous colon polyp   . Allergy   . Anxiety   . History of positive PPD 10/16/2011  . Estrogen deficiency   . IBS (irritable bowel  syndrome)   . Anemia   . Vulvitis   . HSV-2 infection   . Gastritis   . Colon polyp   . Shingles 02/09/2011    Right upper face and eyelid but not involving the eye at this point.     Family History  Problem Relation Age of Onset  . Alzheimer's disease Mother   . Heart attack Father   . Asthma Sister   . Colon cancer Sister   . Heart attack Brother   . Colon polyps Sister   . Colon cancer Sister   . Colon cancer Paternal Aunt     History   Social History  . Marital Status: Single    Spouse Name: widowed.     Number of Children: Y  . Years of Education: N/A   Occupational History  . retired.     [employer]----now retired.  .     Social History Main Topics  . Smoking status: Former Smoker -- 0.50 packs/day for 23 years    Types: Cigarettes    Quit date: 06/16/1984  . Smokeless tobacco: Never Used  . Alcohol Use: No  . Drug Use: No  . Sexual Activity: Not  Currently    Partners: Male    Birth Control/ Protection: Surgical   Other Topics Concern  . None   Social History Narrative   Retired    former smoker    widowed fall 2010 from myeloma. Lives alone.    [address]  No pets   Taking care of 17 month old GC    Outpatient Encounter Prescriptions as of 06/23/2013  Medication Sig  . acyclovir (ZOVIRAX) 400 MG tablet Take PO BID PRN  . Cholecalciferol (VITAMIN D) 1000 UNITS capsule Take 1,000 Units by mouth daily as needed.   . cyanocobalamin 1000 MCG tablet Take 1,000 mcg by mouth daily.  . fish oil-omega-3 fatty acids 1000 MG capsule Take 2 g by mouth daily.    . fluticasone (FLONASE) 50 MCG/ACT nasal spray INHALE 2 SPRAYS IN EACH NOSTRIL EVERY DAY FOR RHINITIS  . ibuprofen (ADVIL,MOTRIN) 600 MG tablet Take 600 mg by mouth every 6 (six) hours as needed for pain.  Marland Kitchen omeprazole (PRILOSEC) 20 MG capsule Take 20 mg by mouth daily.   Marland Kitchen SYNTHROID 100 MCG tablet TAKE 1 TABLET (100 MCG TOTAL) BY MOUTH DAILY.  Marland Kitchen triamterene-hydrochlorothiazide (MAXZIDE-25) 37.5-25 MG per  tablet TAKE 1 TABLET BY MOUTH EVERY DAY  . [DISCONTINUED] fluticasone (FLONASE) 50 MCG/ACT nasal spray INHALE 2 SPRAYS IN EACH NOSTRIL EVERY DAY FOR RHINITIS    EXAM:  BP 116/64  Pulse 58  Temp(Src) 98.4 F (36.9 C) (Oral)  Wt 200 lb (90.719 kg)  SpO2 97%  Body mass index is 32.3 kg/(m^2).  GENERAL: vitals reviewed and listed above, alert, oriented, appears well hydrated and in no acute distress HEENT: atraumatic, conjunctiva  clear, no obvious abnormalities on inspection of external nose and ears OP : no lesion edema or exudate  NECK: no obvious masses on inspection palpation no adenopathy LUNGS: clear to auscultation bilaterally, no wheezes, rales or rhonchi, good air movement CV: HRRR, no clubbing cyanosis or  peripheral edema nl cap refill pulses are present lower extremity MS: moves all extremities without noticeable focal  abnormality motor not tested lower extremity reported to have a mild weakness in her right lower extremity no clonus no fasciculations grip strength is normal DTRs are present and brisk upper extremity Neurologic no tremor gait no acute abnormalities balance appears to be good. PSYCH: pleasant and cooperative, no obvious depression or anxiety Lab Results  Component Value Date   WBC 5.4 10/20/2012   HGB 12.5 10/20/2012   HCT 37.6 10/20/2012   PLT 211.0 10/20/2012   GLUCOSE 101* 10/20/2012   CHOL 172 10/20/2012   TRIG 93.0 10/20/2012   HDL 49.00 10/20/2012   LDLDIRECT 131.1 08/09/2009   LDLCALC 104* 10/20/2012   ALT 19 10/20/2012   AST 23 10/20/2012   NA 142 10/20/2012   K 4.1 10/20/2012   CL 105 10/20/2012   CREATININE 0.8 10/20/2012   BUN 22 10/20/2012   CO2 29 10/20/2012   TSH 1.05 10/20/2012   INR 0.9 04/14/2007   HGBA1C 6.1 10/20/2012    ASSESSMENT AND PLAN:  Discussed the following assessment and plan:  Leg cramps - Plan: cyanocobalamin 1000 MCG tablet, Basic metabolic panel, TSH, Magnesium, CK, Aldolase, Ankle brachial index  Muscle cramp - Plan:  cyanocobalamin 1000 MCG tablet, Basic metabolic panel, TSH, Magnesium, CK, Aldolase, Ankle brachial index  Unspecified essential hypertension - Plan: cyanocobalamin 1000 MCG tablet, Basic metabolic panel, TSH, Magnesium, CK, Aldolase, Ankle brachial index  HYPOTHYROIDISM Cramps are getting worse has upper and lower extremity cramps  hands related to cold lower extremity possibly partly related to her previous back predicament however is becoming problematic for function and she has stopped her statins without significant help. Check metabolic today ABI plan discussed. Fortunately her exam is reassuring today. -Patient advised to return or notify health care team  if symptoms worsen or persist or new concerns arise.  Patient Instructions  Lab work today and we'll let she know the results. If this is unrevealing we may consider temporarily stopping the diuretic to see if there is any difference in her symptoms. If persistent however without improvement the next step would be getting a neurologist to evaluate. You'll be contacted about getting an ABI or a blood pressure artery screening test of your lower extremities I don't really think it was the cholesterol medicine causing the problem but okay to stay off of it at this time.   Neta Mends. Adea Geisel M.D.

## 2013-06-23 NOTE — Patient Instructions (Signed)
Lab work today and we'll let she know the results. If this is unrevealing we may consider temporarily stopping the diuretic to see if there is any difference in her symptoms. If persistent however without improvement the next step would be getting a neurologist to evaluate. You'll be contacted about getting an ABI or a blood pressure artery screening test of your lower extremities I don't really think it was the cholesterol medicine causing the problem but okay to stay off of it at this time.

## 2013-06-25 ENCOUNTER — Other Ambulatory Visit: Payer: Self-pay | Admitting: Family Medicine

## 2013-06-25 DIAGNOSIS — R252 Cramp and spasm: Secondary | ICD-10-CM

## 2013-06-25 DIAGNOSIS — I10 Essential (primary) hypertension: Secondary | ICD-10-CM

## 2013-06-26 LAB — ALDOLASE: Aldolase: 6 U/L (ref <=8.1)

## 2013-06-28 ENCOUNTER — Other Ambulatory Visit: Payer: Self-pay | Admitting: Family Medicine

## 2013-06-28 DIAGNOSIS — I10 Essential (primary) hypertension: Secondary | ICD-10-CM

## 2013-06-28 DIAGNOSIS — R252 Cramp and spasm: Secondary | ICD-10-CM

## 2013-06-29 ENCOUNTER — Ambulatory Visit (HOSPITAL_COMMUNITY): Payer: Medicare Other | Attending: Internal Medicine

## 2013-06-29 DIAGNOSIS — I70219 Atherosclerosis of native arteries of extremities with intermittent claudication, unspecified extremity: Secondary | ICD-10-CM | POA: Diagnosis not present

## 2013-06-29 DIAGNOSIS — Z87891 Personal history of nicotine dependence: Secondary | ICD-10-CM | POA: Diagnosis not present

## 2013-06-29 DIAGNOSIS — I10 Essential (primary) hypertension: Secondary | ICD-10-CM | POA: Insufficient documentation

## 2013-06-29 DIAGNOSIS — R209 Unspecified disturbances of skin sensation: Secondary | ICD-10-CM | POA: Insufficient documentation

## 2013-06-29 DIAGNOSIS — I739 Peripheral vascular disease, unspecified: Secondary | ICD-10-CM | POA: Diagnosis not present

## 2013-06-29 DIAGNOSIS — E785 Hyperlipidemia, unspecified: Secondary | ICD-10-CM | POA: Diagnosis not present

## 2013-06-29 DIAGNOSIS — R252 Cramp and spasm: Secondary | ICD-10-CM

## 2013-09-09 DIAGNOSIS — N951 Menopausal and female climacteric states: Secondary | ICD-10-CM | POA: Diagnosis not present

## 2013-09-09 DIAGNOSIS — Z124 Encounter for screening for malignant neoplasm of cervix: Secondary | ICD-10-CM | POA: Diagnosis not present

## 2013-09-23 ENCOUNTER — Ambulatory Visit (INDEPENDENT_AMBULATORY_CARE_PROVIDER_SITE_OTHER): Payer: Medicare Other | Admitting: Internal Medicine

## 2013-09-23 ENCOUNTER — Encounter: Payer: Self-pay | Admitting: Internal Medicine

## 2013-09-23 VITALS — BP 138/76 | HR 64 | Temp 98.8°F | Ht 66.0 in | Wt 211.0 lb

## 2013-09-23 DIAGNOSIS — R252 Cramp and spasm: Secondary | ICD-10-CM

## 2013-09-23 DIAGNOSIS — F419 Anxiety disorder, unspecified: Secondary | ICD-10-CM

## 2013-09-23 DIAGNOSIS — I10 Essential (primary) hypertension: Secondary | ICD-10-CM

## 2013-09-23 DIAGNOSIS — F411 Generalized anxiety disorder: Secondary | ICD-10-CM

## 2013-09-23 MED ORDER — LORAZEPAM 0.5 MG PO TABS: 0.5000 mg | ORAL_TABLET | Freq: Two times a day (BID) | ORAL | Status: DC | PRN

## 2013-09-23 MED ORDER — TRIAMTERENE-HCTZ 37.5-25 MG PO TABS: 0.5000 | ORAL_TABLET | Freq: Every day | ORAL | Status: DC

## 2013-09-23 NOTE — Patient Instructions (Signed)
It seems that the diuretic bp medication is aggravating your cramps . We can change the Bp medication if needed or  consider adding magnesium supplement .    Lets try 1/2 fluid pill and take magnesium supplement . To see if  Cramps are less.  Magnesium oxide .   400 - 500 mg  1-2 per day Or magnesium sulfate 1 gram - 2 gram per day ( could cause diarrheaif too much)   lorazepam as needed for anxiety but avoid taking regularly . This medication is dependent producing if taken regularly . Can cause mental fogginess  . dont drive or use with alcohol .  return office visit in 3 months or call about how this is all working.

## 2013-09-23 NOTE — Progress Notes (Signed)
Chief Complaint  Patient presents with  . Follow-up    She has not been taking her BP medication due to muscle spasms.    HPI: Patient comes in today for follow up of  multiple medical problems.     Tried off the bp med diuretic  And some improvement of cramps and on 3rd day got muscle spasm.  When restarted thigh feet etc   No meds since Sunday.   And better again  . After an episode gets Sore  After this usually feet and toes.  IN past had eval:   Dr Windell Moment and [place]   No reason for pain in legs  Watching dr Irena Cords ? Could she have FM?  Noise inb left ear   notcied noise in ear.les on diuretic!  600 mg ibuprofen some help.  ? If  celebrex but concern about se.  Had left over lorazepam and uysed one at [facility] made her feel calm ? If can use again .  Had itching once uncertain if from med  No rash has 2 pills left from a few years ago   ROS: See pertinent positives and negatives per HPI. No cp sob now  No falling  Past Medical History  Diagnosis Date  . GERD (gastroesophageal reflux disease)   . Hypertension   . Hyperlipidemia   . Thyroid disease   . Pulsatile tinnitus     had MRA and MPV nl mild carotid dopplers2010  . Hyperglycemia   . Headache(784.0)   . Cough     ent evaluation  . Abdominal pain      Neg ct [place] and mri of back   . DJD (degenerative joint disease) of lumbar spine   . Adenomatous colon polyp   . Allergy   . Anxiety   . History of positive PPD 10/16/2011  . Estrogen deficiency   . IBS (irritable bowel syndrome)   . Anemia   . Vulvitis   . HSV-2 infection   . Gastritis   . Colon polyp   . Shingles 02/09/2011    Right upper face and eyelid but not involving the eye at this point.     Family History  Problem Relation Age of Onset  . Alzheimer's disease Mother   . Heart attack Father   . Asthma Sister   . Colon cancer Sister   . Heart attack Brother   . Colon polyps Sister   . Colon cancer Sister   . Colon cancer Paternal Aunt     History     Social History  . Marital Status: Single    Spouse Name: widowed.     Number of Children: Y  . Years of Education: N/A   Occupational History  . retired.     [employer]----now retired.  .     Social History Main Topics  . Smoking status: Former Smoker -- 0.50 packs/day for 23 years    Types: Cigarettes    Quit date: 06/16/1984  . Smokeless tobacco: Never Used  . Alcohol Use: No  . Drug Use: No  . Sexual Activity: Not Currently    Partners: Male    Birth Control/ Protection: Surgical   Other Topics Concern  . None   Social History Narrative   Retired    former smoker    widowed fall 2010 from myeloma. Lives alone.    [address]  No pets   Taking care of 39 month old [place]    Outpatient Encounter  Prescriptions as of 09/23/2013  Medication Sig  . acyclovir (ZOVIRAX) 400 MG tablet Take PO BID PRN  . Cholecalciferol (VITAMIN D) 1000 UNITS capsule Take 1,000 Units by mouth daily as needed.   . cyanocobalamin 1000 MCG tablet Take 1,000 mcg by mouth daily.  . fish oil-omega-3 fatty acids 1000 MG capsule Take 2 g by mouth daily.    . fluticasone (FLONASE) 50 MCG/ACT nasal spray INHALE 2 SPRAYS IN EACH NOSTRIL EVERY DAY FOR RHINITIS  . ibuprofen (ADVIL,MOTRIN) 600 MG tablet Take 600 mg by mouth every 6 (six) hours as needed for pain.  Marland Kitchen omeprazole (PRILOSEC) 20 MG capsule Take 20 mg by mouth daily.   Marland Kitchen SYNTHROID 100 MCG tablet TAKE 1 TABLET (100 MCG TOTAL) BY MOUTH DAILY.  Marland Kitchen triamterene-hydrochlorothiazide (MAXZIDE-25) 37.5-25 MG per tablet Take 0.5 tablets by mouth daily.  . [DISCONTINUED] triamterene-hydrochlorothiazide (MAXZIDE-25) 37.5-25 MG per tablet TAKE 1 TABLET BY MOUTH EVERY DAY  . LORazepam (ATIVAN) 0.5 MG tablet Take 1 tablet (0.5 mg total) by mouth 2 (two) times daily as needed for anxiety.    EXAM:  BP 138/76  Pulse 64  Temp(Src) 98.8 F (37.1 C) (Oral)  Ht 5\' 6"  (1.676 m)  Wt 211 lb (95.709 kg)  BMI 34.07 kg/m2  SpO2 98%  Body mass index is 34.07  kg/(m^2).  GENERAL: vitals reviewed and listed above, alert, oriented, appears well hydrated and in no acute distress MS: moves all extremities without noticeable focal  abnormality  PSYCH: pleasant and cooperative, no obvious depression or anxiety  ASSESSMENT AND PLAN:  Discussed the following assessment and plan:  Leg cramps  Muscle cramps - aggravated buy diuretic despite nl readings k and mg  add supplement and dec dose and follow  diurteic seem to help the ringing in her ears!Marland Kitchen  Unspecified essential hypertension  Anxiety - ocass  caution but ok to use loraz if needed  Also disc FM and how we are uncertain what it is bu it is managed and not medication based rx .  At this time  She cant take lyrica and risk of cymbalta greater than benefit  However. consider low dose hx musc relax if needed in future   Risk benefit of medication discussed. Lorazepam give 20 no refill s  -Patient advised to return or notify health care team  if symptoms worsen or persist or new concerns arise.  Patient Instructions  It seems that the diuretic bp medication is aggravating your cramps . We can change the Bp medication if needed or  consider adding magnesium supplement .    Lets try 1/2 fluid pill and take magnesium supplement . To see if  Cramps are less.  Magnesium oxide .   400 - 500 mg  1-2 per day Or magnesium sulfate 1 gram - 2 gram per day ( could cause diarrheaif too much)   lorazepam as needed for anxiety but avoid taking regularly . This medication is dependent producing if taken regularly . Can cause mental fogginess  . dont drive or use with alcohol .  return office visit in 3 months or call about how this is all working.      Standley Brooking. Panosh M.D. Total visit 67mins > 50% spent counseling and coordinating care     Pre visit review using our clinic review tool, if applicable. No additional management support is needed unless otherwise documented below in the visit note.

## 2013-09-24 ENCOUNTER — Telehealth: Payer: Self-pay | Admitting: Internal Medicine

## 2013-09-24 NOTE — Telephone Encounter (Signed)
Relevant patient education mailed to patient.  

## 2013-11-05 ENCOUNTER — Ambulatory Visit (INDEPENDENT_AMBULATORY_CARE_PROVIDER_SITE_OTHER): Payer: Medicare Other | Admitting: Internal Medicine

## 2013-11-05 ENCOUNTER — Encounter: Payer: Self-pay | Admitting: Internal Medicine

## 2013-11-05 VITALS — BP 116/64 | HR 61 | Temp 98.8°F | Ht 66.0 in | Wt 205.0 lb

## 2013-11-05 DIAGNOSIS — R252 Cramp and spasm: Secondary | ICD-10-CM

## 2013-11-05 DIAGNOSIS — Z23 Encounter for immunization: Secondary | ICD-10-CM | POA: Diagnosis not present

## 2013-11-05 DIAGNOSIS — Z0000 Encounter for general adult medical examination without abnormal findings: Secondary | ICD-10-CM

## 2013-11-05 DIAGNOSIS — I10 Essential (primary) hypertension: Secondary | ICD-10-CM

## 2013-11-05 DIAGNOSIS — R739 Hyperglycemia, unspecified: Secondary | ICD-10-CM | POA: Insufficient documentation

## 2013-11-05 DIAGNOSIS — R7309 Other abnormal glucose: Secondary | ICD-10-CM

## 2013-11-05 DIAGNOSIS — R413 Other amnesia: Secondary | ICD-10-CM

## 2013-11-05 DIAGNOSIS — Z820 Family history of epilepsy and other diseases of the nervous system: Secondary | ICD-10-CM

## 2013-11-05 DIAGNOSIS — E039 Hypothyroidism, unspecified: Secondary | ICD-10-CM | POA: Diagnosis not present

## 2013-11-05 DIAGNOSIS — E785 Hyperlipidemia, unspecified: Secondary | ICD-10-CM

## 2013-11-05 DIAGNOSIS — G4733 Obstructive sleep apnea (adult) (pediatric): Secondary | ICD-10-CM

## 2013-11-05 LAB — CBC WITH DIFFERENTIAL/PLATELET
Basophils Absolute: 0 10*3/uL (ref 0.0–0.1)
Basophils Relative: 0.6 % (ref 0.0–3.0)
Eosinophils Absolute: 0.1 10*3/uL (ref 0.0–0.7)
Eosinophils Relative: 2.4 % (ref 0.0–5.0)
HCT: 39.8 % (ref 36.0–46.0)
Hemoglobin: 12.9 g/dL (ref 12.0–15.0)
Lymphocytes Relative: 47.3 % — ABNORMAL HIGH (ref 12.0–46.0)
Lymphs Abs: 2.3 10*3/uL (ref 0.7–4.0)
MCHC: 32.3 g/dL (ref 30.0–36.0)
MCV: 89.3 fl (ref 78.0–100.0)
Monocytes Absolute: 0.4 10*3/uL (ref 0.1–1.0)
Monocytes Relative: 8 % (ref 3.0–12.0)
Neutro Abs: 2.1 10*3/uL (ref 1.4–7.7)
Neutrophils Relative %: 41.7 % — ABNORMAL LOW (ref 43.0–77.0)
Platelets: 212 10*3/uL (ref 150.0–400.0)
RBC: 4.46 Mil/uL (ref 3.87–5.11)
RDW: 13.8 % (ref 11.5–14.6)
WBC: 4.9 10*3/uL (ref 4.5–10.5)

## 2013-11-05 LAB — LIPID PANEL
Cholesterol: 213 mg/dL — ABNORMAL HIGH (ref 0–200)
HDL: 50.9 mg/dL (ref >39.00)
LDL Cholesterol: 137 mg/dL — ABNORMAL HIGH (ref 0–99)
Total CHOL/HDL Ratio: 4
Triglycerides: 125 mg/dL (ref 0.0–149.0)
VLDL: 25 mg/dL (ref 0.0–40.0)

## 2013-11-05 LAB — BASIC METABOLIC PANEL
BUN: 20 mg/dL (ref 6–23)
CO2: 30 mEq/L (ref 19–32)
Calcium: 9.8 mg/dL (ref 8.4–10.5)
Chloride: 103 mEq/L (ref 96–112)
Creatinine, Ser: 0.8 mg/dL (ref 0.4–1.2)
GFR: 88.17 mL/min (ref >60.00)
Glucose, Bld: 99 mg/dL (ref 70–99)
Potassium: 4.2 mEq/L (ref 3.5–5.1)
Sodium: 141 mEq/L (ref 135–145)

## 2013-11-05 LAB — TSH: TSH: 0.6 u[IU]/mL (ref 0.35–5.50)

## 2013-11-05 LAB — HEPATIC FUNCTION PANEL
ALT: 18 U/L (ref 0–35)
AST: 26 U/L (ref 0–37)
Albumin: 4.2 g/dL (ref 3.5–5.2)
Alkaline Phosphatase: 42 U/L (ref 39–117)
Bilirubin, Direct: 0 mg/dL (ref 0.0–0.3)
Total Bilirubin: 0.6 mg/dL (ref 0.3–1.2)
Total Protein: 7.3 g/dL (ref 6.0–8.3)

## 2013-11-05 LAB — HEMOGLOBIN A1C: Hgb A1c MFr Bld: 6.3 % (ref 4.6–6.5)

## 2013-11-05 NOTE — Patient Instructions (Signed)
Will notify you  of labs when available. Continue lifestyle intervention healthy eating and exercise . If progressing concerns about memory  Contact us and we can do referral but at this time don't see significant issues . There are more complete tests than can be done.   rov in 6 months or as needed

## 2013-11-05 NOTE — Assessment & Plan Note (Signed)
Continue hydration etc.

## 2013-11-05 NOTE — Progress Notes (Signed)
Chief Complaint  Patient presents with  . Medicare Wellness    a number of issues   . Hypertension    HPI: Patient comes in today for Preventive Medicare wellness visit . No major injuries, ed visits ,hospitalizations , new medications since last visit. Cramps in legs some better  Hydrating to help taking lower dose of medication and her blood pressure is controlled. Is also doing some self strategies to decrease anxiety and getting worked up as her son says. He does have concerns about her memory at times because her mother had Alzheimer's although she doesn't forget basic concepts orientation calculations. Sometimes short-term memory specific dates her problem or remembering what she was doing. Her family is now worried about her situation as much as she is. She thinks her anxiety is much better with her own strategy techniques. Rarely takes lorazepam. Health Maintenance  Topic Date Due  . Influenza Vaccine  03/12/2014  . Colonoscopy  07/17/2014  . Mammogram  01/15/2015  . Tetanus/tdap  06/23/2022  . Pneumococcal Polysaccharide Vaccine Age 29 And Over  Completed  . Zostavax  Completed   Health Maintenance Review  Hearing: ok   Vision:  No limitations at present . Last eye check UTD  Safety:  Has smoke detector and wears seat belts.  No firearms. No excess sun exposure. Sees dentist regularly.  Falls: no   Advance directive :  Reviewed  Has one.  Memory: Felt to be good by family    Sometime foggy.,fam hx of alzheimer she is worried at times . no concern from her or her family.  Depression: No anhedonia unusual crying or depressive symptoms  Nutrition: Eats well balanced diet; adequate calcium and vitamin D. No swallowing chewing problems.  Injury: no major injuries in the last six months.  Other healthcare providers:  Reviewed today .  Social:  Livesaalone .    Preventive parameters: up-to-date  Reviewed   ADLS:   There are no problems or need for assistance   driving, feeding, obtaining food, dressing, toileting and bathing, managing money using phone. She is independent.  EXERCISE/ HABITS  Per week   No tobacco    etoh   ROS:  GEN/ HEENT: No fever, significant weight changes sweats headaches vision problems hearing changes, CV/ PULM; No chest pain shortness of breath cough, syncope,edema  change in exercise tolerance. GI /GU: No adominal pain, vomiting, change in bowel habits. No blood in the stool. No significant GU symptoms. SKIN/HEME: ,no acute skin rashes suspicious lesions or bleeding. No lymphadenopathy, nodules, masses.  NEURO/ PSYCH:  No neurologic signs such as weakness numbness. No depression anxiety. IMM/ Allergy: No unusual infections.  Allergy .   REST of 12 system review negative except as per HPI   Past Medical History  Diagnosis Date  . GERD (gastroesophageal reflux disease)   . Hypertension   . Hyperlipidemia   . Thyroid disease   . Pulsatile tinnitus     had MRA and MPV nl mild carotid dopplers2010  . Hyperglycemia   . Headache(784.0)   . Cough     ent evaluation  . Abdominal pain      Neg ct [place] and mri of back   . DJD (degenerative joint disease) of lumbar spine   . Adenomatous colon polyp   . Allergy   . Anxiety   . History of positive PPD 10/16/2011  . Estrogen deficiency   . IBS (irritable bowel syndrome)   . Anemia   . Vulvitis   .  HSV-2 infection   . Gastritis   . Colon polyp   . Shingles 02/09/2011    Right upper face and eyelid but not involving the eye at this point.   . Leg cramps 11/05/2013    Appears to have been from the higher dose of diuretic better on low dose and magnesium.     Family History  Problem Relation Age of Onset  . Alzheimer's disease Mother   . Heart attack Father   . Asthma Sister   . Colon cancer Sister   . Heart attack Brother   . Colon polyps Sister   . Colon cancer Sister   . Colon cancer Paternal Aunt     History   Social History  . Marital Status: Single     Spouse Name: widowed.     Number of Children: Y  . Years of Education: N/A   Occupational History  . retired.     [employer]----now retired.  .     Social History Main Topics  . Smoking status: Former Smoker -- 0.50 packs/day for 23 years    Types: Cigarettes    Quit date: 06/16/1984  . Smokeless tobacco: Never Used  . Alcohol Use: No  . Drug Use: No  . Sexual Activity: Not Currently    Partners: Male    Birth Control/ Protection: Surgical   Other Topics Concern  . None   Social History Narrative   Retired    former smoker    widowed fall 2010 from myeloma. Lives alone.    [place] [facility]  No pets   Taking care of 30 month old GC    Outpatient Encounter Prescriptions as of 11/05/2013  Medication Sig  . acyclovir (ZOVIRAX) 400 MG tablet Take PO BID PRN  . Cholecalciferol (VITAMIN D) 1000 UNITS capsule Take 1,000 Units by mouth daily as needed.   . cyanocobalamin 1000 MCG tablet Take 1,000 mcg by mouth daily.  . fish oil-omega-3 fatty acids 1000 MG capsule Take 2 g by mouth daily.    Marland Kitchen ibuprofen (ADVIL,MOTRIN) 600 MG tablet Take 600 mg by mouth every 6 (six) hours as needed for pain.  Marland Kitchen omeprazole (PRILOSEC) 20 MG capsule Take 20 mg by mouth daily.   Marland Kitchen SYNTHROID 100 MCG tablet TAKE 1 TABLET (100 MCG TOTAL) BY MOUTH DAILY.  Marland Kitchen triamterene-hydrochlorothiazide (MAXZIDE-25) 37.5-25 MG per tablet Take 0.5 tablets by mouth daily.  . fluticasone (FLONASE) 50 MCG/ACT nasal spray INHALE 2 SPRAYS IN EACH NOSTRIL EVERY DAY FOR RHINITIS  . LORazepam (ATIVAN) 0.5 MG tablet Take 1 tablet (0.5 mg total) by mouth 2 (two) times daily as needed for anxiety.    EXAM:  BP 116/64  Pulse 61  Temp(Src) 98.8 F (37.1 C) (Oral)  Ht 5\' 6"  (1.676 m)  Wt 205 lb (92.987 kg)  BMI 33.10 kg/m2  SpO2 96%  Body mass index is 33.1 kg/(m^2).  Physical Exam: Vital signs reviewed ZJQ:BHAL is a well-developed well-nourished alert cooperative   who appears stated age in no acute distress. Looks well today HEENT:  normocephalic atraumatic , Eyes: PERRL EOM's full, conjunctiva clear, Nares: paten,t no deformity discharge or tenderness., Ears: no deformity EAC's clear TMs with normal landmarks. Mouth: clear OP, no lesions, edema.  Moist mucous membranes. Dentition in adequate repair. NECK: supple without masses, thyromegaly or bruits. CHEST/PULM:  Clear to auscultation and percussion breath sounds equal no wheeze , rales or rhonchi. No chest wall deformities or tenderness. Breast: normal by inspection . No dimpling, discharge, masses,  tenderness or discharge . CV: PMI is nondisplaced, S1 S2 no gallops, murmurs, rubs. Peripheral pulses are full without delay.No JVD .  ABDOMEN: Bowel sounds normal nontender  No guard or rebound, no hepato splenomegal no CVA tenderness.  No hernia. Extremtities:  No clubbing cyanosis or edema, no acute joint swelling or redness no focal atrophy NEURO:  Oriented x3, cranial nerves 3-12 appear to be intact, no obvious focal weakness,gait within normal limits no abnormal reflexes or asymmetrical SKIN: No acute rashes normal turgor, color, no bruising or petechiae. PSYCH: Oriented, good eye contact, no obvious depression anxiety, cognition and judgment appear normal. LN: no cervical axillary  adenopathy No noted deficits in memory, attention, and speech. Oriented x3 clock drawing task given pass  Lab Results  Component Value Date   WBC 4.9 11/05/2013   HGB 12.9 11/05/2013   HCT 39.8 11/05/2013   PLT 212.0 11/05/2013   GLUCOSE 99 11/05/2013   CHOL 213* 11/05/2013   TRIG 125.0 11/05/2013   HDL 50.90 11/05/2013   LDLDIRECT 131.1 08/09/2009   LDLCALC 137* 11/05/2013   ALT 18 11/05/2013   AST 26 11/05/2013   NA 141 11/05/2013   K 4.2 11/05/2013   CL 103 11/05/2013   CREATININE 0.8 11/05/2013   BUN 20 11/05/2013   CO2 30 11/05/2013   TSH 0.60 11/05/2013   INR 0.9 04/14/2007   HGBA1C 6.3 11/05/2013   See clock drawing done  correctly  ASSESSMENT AND PLAN:  Discussed the following  assessment and plan:  Medicare annual wellness visit, subsequent - utd prevnar today   Need for vaccination with 13-polyvalent pneumococcal conjugate vaccine - Plan: Pneumococcal conjugate vaccine 13-valent  Unspecified essential hypertension - Controlled recheck chemistries - Plan: CBC with Differential, Basic metabolic panel, Hemoglobin A1c, Hepatic function panel, Lipid panel, TSH  Unspecified hypothyroidism - Controlled recheck labs - Plan: CBC with Differential, Basic metabolic panel, Hemoglobin A1c, Hepatic function panel, Lipid panel, TSH  Other and unspecified hyperlipidemia - check labs  - Plan: CBC with Differential, Basic metabolic panel, Hemoglobin A1c, Hepatic function panel, Lipid panel, TSH  Hyperglycemia - His tube elevation check A1c - Plan: CBC with Differential, Basic metabolic panel, Hemoglobin A1c, Hepatic function panel, Lipid panel, TSH  Leg cramps - Much better probably related to medication her on half dose diuretic - Plan: CBC with Differential, Basic metabolic panel, Hemoglobin A1c, Hepatic function panel, Lipid panel, TSH  Family history of Alzheimer's disease - Mother age 70 and maternal aunt  Memory problem - Minimal screening today does not appear to be pathologically based however if any progression or concerns we can get neurology and more formal testing. follow   OSA (obstructive sleep apnea)  Patient Care Team: Burnis Medin, MD as PCP - General Erline Levine, MD (Neurosurgery) Jerrell Belfast, MD Kathee Delton, MD (Pulmonary Disease) Eldred Manges, MD (Obstetrics and Gynecology) Gatha Mayer, MD (Gastroenterology) Roseanne Kaufman, MD as Consulting Physician (Orthopedic Surgery)  Patient Instructions  Will notify you  of labs when available. Continue lifestyle intervention healthy eating and exercise . If progressing concerns about memory  Contact us and we can do referral but at this time don't see significant issues . There are more  complete tests than can be done.   rov in 6 months or as needed      Standley Brooking. Carlis Blanchard M.D.  Extra visit time for memory and problem based   Issues 72mins > 50% spent counseling and coordinating care   Pre visit review  using our clinic review tool, if applicable. No additional management support is needed unless otherwise documented below in the visit note.

## 2013-12-20 ENCOUNTER — Ambulatory Visit (INDEPENDENT_AMBULATORY_CARE_PROVIDER_SITE_OTHER): Payer: Medicare Other | Admitting: Podiatry

## 2013-12-20 ENCOUNTER — Ambulatory Visit (INDEPENDENT_AMBULATORY_CARE_PROVIDER_SITE_OTHER): Payer: Medicare Other

## 2013-12-20 ENCOUNTER — Encounter: Payer: Self-pay | Admitting: Podiatry

## 2013-12-20 VITALS — BP 135/65 | HR 80 | Resp 16

## 2013-12-20 DIAGNOSIS — M6740 Ganglion, unspecified site: Secondary | ICD-10-CM

## 2013-12-20 DIAGNOSIS — M898X9 Other specified disorders of bone, unspecified site: Secondary | ICD-10-CM | POA: Diagnosis not present

## 2013-12-20 DIAGNOSIS — M779 Enthesopathy, unspecified: Secondary | ICD-10-CM | POA: Diagnosis not present

## 2013-12-20 NOTE — Progress Notes (Signed)
   Subjective:    Patient ID: [name] [name], female    DOB: 06/17/1941, 73 y.o.   MRN: [id number]  HPI Comments: "I have this knot"  Patient c/o soft knot lateral right foot for a few months. Has grown in size, but not really painful. Little discomfort with shoes. No treatment.  Foot Pain Associated symptoms include arthralgias, coughing and a sore throat.      Review of Systems  HENT: Positive for sinus pressure, sneezing and sore throat.   Respiratory: Positive for cough.   Musculoskeletal: Positive for arthralgias.  All other systems reviewed and are negative.      Objective:   Physical Exam        Assessment & Plan:

## 2013-12-21 NOTE — Progress Notes (Signed)
Subjective:     Patient ID: [name] [name], female   DOB: 1941-05-05, 73 y.o.   MRN: [id number]  Foot Pain   patient presents stating that she has a mass on the outside of her right foot that has become moderately painful it has grown over the last few months   Review of Systems  All other systems reviewed and are negative.      Objective:   Physical Exam  Nursing note and vitals reviewed. Constitutional: She is oriented to person, place, and time.  Cardiovascular: Intact distal pulses.   Musculoskeletal: Normal range of motion.  Neurological: She is oriented to person, place, and time.  Skin: Skin is warm.   neurovascular status is intact with muscle strength adequate and range of motion subtalar midtarsal joint within normal limits. Patient's digits are well perfused and arch height is normal upon weightbearing and gait is normal upon evaluation. 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 Did a proximal nerve block of the area I then aspirated the mass and was able to get out to about 1 cc of gelatinous material applied a small amount of steroid under it and compressed the area which she will do for the next 2 weeks. I stated that given the gelatinous appearance that this certainly appears to be a ganglionic cyst

## 2014-01-04 ENCOUNTER — Other Ambulatory Visit: Payer: Self-pay

## 2014-01-04 DIAGNOSIS — Z1231 Encounter for screening mammogram for malignant neoplasm of breast: Secondary | ICD-10-CM

## 2014-01-19 ENCOUNTER — Ambulatory Visit
Admission: RE | Admit: 2014-01-19 | Discharge: 2014-01-19 | Disposition: A | Discharge status: Home / Self Care | Payer: Medicare Other | Source: Ambulatory Visit

## 2014-01-19 ENCOUNTER — Encounter (INDEPENDENT_AMBULATORY_CARE_PROVIDER_SITE_OTHER): Payer: Self-pay

## 2014-01-19 DIAGNOSIS — Z1231 Encounter for screening mammogram for malignant neoplasm of breast: Secondary | ICD-10-CM

## 2014-02-10 ENCOUNTER — Other Ambulatory Visit: Payer: Self-pay | Admitting: Internal Medicine

## 2014-02-10 NOTE — Telephone Encounter (Signed)
Sent to the pharmacy by e-scribe. 

## 2014-02-28 ENCOUNTER — Ambulatory Visit (INDEPENDENT_AMBULATORY_CARE_PROVIDER_SITE_OTHER): Payer: Medicare Other | Admitting: Internal Medicine

## 2014-02-28 ENCOUNTER — Encounter: Payer: Self-pay | Admitting: Internal Medicine

## 2014-02-28 VITALS — BP 134/76 | HR 71 | Temp 100.0°F | Ht 66.0 in | Wt 212.0 lb

## 2014-02-28 DIAGNOSIS — R509 Fever, unspecified: Secondary | ICD-10-CM | POA: Diagnosis not present

## 2014-02-28 DIAGNOSIS — Z20818 Contact with and (suspected) exposure to other bacterial communicable diseases: Secondary | ICD-10-CM

## 2014-02-28 DIAGNOSIS — Z2089 Contact with and (suspected) exposure to other communicable diseases: Secondary | ICD-10-CM | POA: Diagnosis not present

## 2014-02-28 DIAGNOSIS — J029 Acute pharyngitis, unspecified: Secondary | ICD-10-CM | POA: Diagnosis not present

## 2014-02-28 DIAGNOSIS — J22 Unspecified acute lower respiratory infection: Secondary | ICD-10-CM

## 2014-02-28 DIAGNOSIS — J988 Other specified respiratory disorders: Secondary | ICD-10-CM | POA: Diagnosis not present

## 2014-02-28 LAB — POCT RAPID STREP A (OFFICE): Rapid Strep A Screen: NEGATIVE

## 2014-02-28 NOTE — Progress Notes (Signed)
Chief Complaint  Patient presents with  . Sore Throat    exposed to strep x2 days  . Headache  . Nasal Congestion  . Muscle Pain    HPI: Patient comes in today for SDA for  new problem evaluation. Onset above  For 2-3 days  Has had som PND for a heil but no fever face [pain 73 year old  [name] [name] had Positive strep test after a congestion illness.   Pt has had Right nostril drainage  while  But throat  Pain since 2 days; very sore and like sandpaper rubbung togetther low in throat  Coughing an sneezing and heaadaches  Low grade fever despite tylenol  ROS: See pertinent positives and negatives per HPI. No cough med some otc uncertain what to take  No cp sob syncope NVD but dec appetite   Past Medical History  Diagnosis Date  . GERD (gastroesophageal reflux disease)   . Hypertension   . Hyperlipidemia   . Thyroid disease   . Pulsatile tinnitus     had MRA and MPV nl mild carotid dopplers2010  . Hyperglycemia   . Headache(784.0)   . Cough     ent evaluation  . Abdominal pain      Neg ct [place] and mri of back   . DJD (degenerative joint disease) of lumbar spine   . Adenomatous colon polyp   . Allergy   . Anxiety   . History of positive PPD 10/16/2011  . Estrogen deficiency   . IBS (irritable bowel syndrome)   . Anemia   . Vulvitis   . HSV-2 infection   . Gastritis   . Colon polyp   . Shingles 02/09/2011    Right upper face and eyelid but not involving the eye at this point.   . Leg cramps 11/05/2013    Appears to have been from the higher dose of diuretic better on low dose and magnesium.     Family History  Problem Relation Age of Onset  . Alzheimer's disease Mother   . Heart attack Father   . Asthma Sister   . Colon cancer Sister   . Heart attack Brother   . Colon polyps Sister   . Colon cancer Sister   . Colon cancer Paternal Aunt     History   Social History  . Marital Status: Single    Spouse Name: widowed.     Number of Children: Y  . Years of  Education: N/A   Occupational History  . retired.     [employer]----now retired.  .     Social History Main Topics  . Smoking status: Former Smoker -- 0.50 packs/day for 23 years    Types: Cigarettes    Quit date: 06/16/1984  . Smokeless tobacco: Never Used  . Alcohol Use: No  . Drug Use: No  . Sexual Activity: Not Currently    Partners: Male    Birth Control/ Protection: Surgical   Other Topics Concern  . None   Social History Narrative   Retired    former smoker    widowed fall 2010 from myeloma. Lives alone.    [address]hof [address]  No pets   Taking care of 53 month old GC    Outpatient Encounter Prescriptions as of 02/28/2014  Medication Sig  . acyclovir (ZOVIRAX) 400 MG tablet Take PO BID PRN  . Cholecalciferol (VITAMIN D) 1000 UNITS capsule Take 1,000 Units by mouth daily as needed.   . cyanocobalamin 1000 MCG  tablet Take 1,000 mcg by mouth daily.  . fish oil-omega-3 fatty acids 1000 MG capsule Take 2 g by mouth daily.    . fluticasone (FLONASE) 50 MCG/ACT nasal spray INHALE 2 SPRAYS IN EACH NOSTRIL EVERY DAY FOR RHINITIS  . ibuprofen (ADVIL,MOTRIN) 600 MG tablet Take 600 mg by mouth every 6 (six) hours as needed for pain.  Marland Kitchen LORazepam (ATIVAN) 0.5 MG tablet Take 1 tablet (0.5 mg total) by mouth 2 (two) times daily as needed for anxiety.  Marland Kitchen omeprazole (PRILOSEC) 20 MG capsule Take 20 mg by mouth daily.   Marland Kitchen SYNTHROID 100 MCG tablet TAKE 1 TABLET BY MOUTH EVERY DAY  . triamterene-hydrochlorothiazide (MAXZIDE-25) 37.5-25 MG per tablet TAKE 1 TABLET BY MOUTH EVERY DAY  . [DISCONTINUED] triamterene-hydrochlorothiazide (MAXZIDE-25) 37.5-25 MG per tablet Take 0.5 tablets by mouth daily.    EXAM:  BP 134/76  Pulse 71  Temp(Src) 100 F (37.8 C) (Oral)  Ht 5\' 6"  (1.676 m)  Wt 212 lb (96.163 kg)  BMI 34.23 kg/m2  Body mass index is 34.23 kg/(m^2). WDWN in NAD  quiet respirations; mildly congested  somewhat hoarse. Non toxic . HEENT: Normocephalic ;atraumatic , Eyes;  PERRL, EOMs  Full,  lids and conjunctiva clear,,Ears: no deformities, canals nl, TM landmarks normal, Nose: no deformity or discharge but congested;face non tender Mouth : OP clear without lesion or edema .mild mod erythema  No exudate Neck: Supple without adenopathy or masses or bruits shoddy nodes  Chest:  Clear to A&P without wheezes rales or rhonchi CV:  S1-S2 no gallops or murmurs peripheral perfusion is normal Skin :nl perfusion and no acute rashes   ASSESSMENT AND PLAN:  Discussed the following assessment and plan:  Fever, unspecified - Plan: POC Rapid Strep A, Throat culture ([facility])  Sore throat - Plan: POC Rapid Strep A, Throat culture ([facility])  Acute respiratory infection  Exposure to Streptococcus infection Acts more like a viral resp infection but because of exposure check cx   Otherwise   Expectant management. And comfort meds  Close observation if fever not resolved soon or as needed -Patient advised to return or notify health care team  if symptoms worsen ,persist or new concerns arise.  Patient Instructions  This acts like a viral respiratory infection that should run  Its course but since you are exposed to strep we did the culture test. And will let you know when results are back,  Fluids tylenol rest  Saline nose spray.  mucinex  Ok to loosen up the mucous  Contact  us if fever nor gone in the next 2 days or  Sever pain   Cough can last 1-2 weeks otherwise as you are getting better .      Standley Brooking. Panosh M.D.

## 2014-02-28 NOTE — Patient Instructions (Signed)
This acts like a viral respiratory infection that should run  Its course but since you are exposed to strep we did the culture test. And will let you know when results are back,  Fluids tylenol rest  Saline nose spray.  mucinex  Ok to loosen up the mucous  Contact  us if fever nor gone in the next 2 days or  Sever pain   Cough can last 1-2 weeks otherwise as you are getting better .

## 2014-02-28 NOTE — Progress Notes (Signed)
Pre visit review using our clinic review tool, if applicable. No additional management support is needed unless otherwise documented below in the visit note. 

## 2014-03-02 ENCOUNTER — Telehealth: Payer: Self-pay | Admitting: Internal Medicine

## 2014-03-02 LAB — CULTURE, GROUP A STREP: Organism ID, Bacteria: NORMAL

## 2014-03-02 NOTE — Telephone Encounter (Signed)
Pt would like strep test results and also pt has start having pain back. Pt would like something call into [facility] [address]

## 2014-03-03 NOTE — Telephone Encounter (Signed)
Patient notified of negative strep results.  Back pain is due to arthritis and surgery in 1991.  Lower back pain.  Not seeing anyone for this problem.  Pain comes and goes throughout the year.  She states it happens most often when the weather changes.  Has been treating with a prescription that was given to her by Dr. Vertell Limber.  600 mg of Advil.  Please advise.  Thanks!

## 2014-03-16 NOTE — Telephone Encounter (Signed)
Please check on how her back pain is  Now  Usually just use tylenol  As needed.   if  persistent or progressive she should have a visit .   (ibuprofen has some risk  Stomach and kidney )

## 2014-03-18 NOTE — Telephone Encounter (Signed)
Spoke to the pt.  She has been taking 2 Aleve and is feeling better.  Will make an appt if back pain is worsening.

## 2014-04-07 ENCOUNTER — Encounter: Payer: Self-pay | Admitting: Internal Medicine

## 2014-05-25 ENCOUNTER — Ambulatory Visit (INDEPENDENT_AMBULATORY_CARE_PROVIDER_SITE_OTHER): Payer: Medicare Other

## 2014-05-25 DIAGNOSIS — Z23 Encounter for immunization: Secondary | ICD-10-CM

## 2014-06-13 ENCOUNTER — Encounter: Payer: Self-pay | Admitting: Internal Medicine

## 2014-07-11 ENCOUNTER — Encounter: Payer: Self-pay | Admitting: Internal Medicine

## 2014-08-03 DIAGNOSIS — H35373 Puckering of macula, bilateral: Secondary | ICD-10-CM | POA: Diagnosis not present

## 2014-08-03 DIAGNOSIS — Z961 Presence of intraocular lens: Secondary | ICD-10-CM | POA: Diagnosis not present

## 2014-08-03 DIAGNOSIS — H43313 Vitreous membranes and strands, bilateral: Secondary | ICD-10-CM | POA: Diagnosis not present

## 2014-08-03 DIAGNOSIS — H33193 Other retinoschisis and retinal cysts, bilateral: Secondary | ICD-10-CM | POA: Diagnosis not present

## 2014-08-10 ENCOUNTER — Encounter: Payer: Self-pay | Admitting: Internal Medicine

## 2014-08-30 ENCOUNTER — Other Ambulatory Visit: Payer: Self-pay | Admitting: Radiology

## 2014-08-30 ENCOUNTER — Encounter: Payer: Self-pay | Admitting: Internal Medicine

## 2014-08-30 ENCOUNTER — Ambulatory Visit (INDEPENDENT_AMBULATORY_CARE_PROVIDER_SITE_OTHER): Payer: Medicare Other | Admitting: Internal Medicine

## 2014-08-30 ENCOUNTER — Encounter (HOSPITAL_COMMUNITY): Payer: Medicare Other

## 2014-08-30 VITALS — BP 124/70 | Temp 98.4°F | Ht 66.0 in | Wt 214.3 lb

## 2014-08-30 DIAGNOSIS — M79602 Pain in left arm: Secondary | ICD-10-CM | POA: Diagnosis not present

## 2014-08-30 DIAGNOSIS — Z9889 Other specified postprocedural states: Secondary | ICD-10-CM

## 2014-08-30 DIAGNOSIS — R52 Pain, unspecified: Secondary | ICD-10-CM

## 2014-08-30 DIAGNOSIS — M7989 Other specified soft tissue disorders: Secondary | ICD-10-CM

## 2014-08-30 DIAGNOSIS — I8390 Asymptomatic varicose veins of unspecified lower extremity: Secondary | ICD-10-CM

## 2014-08-30 DIAGNOSIS — M79605 Pain in left leg: Secondary | ICD-10-CM | POA: Diagnosis not present

## 2014-08-30 DIAGNOSIS — I8393 Asymptomatic varicose veins of bilateral lower extremities: Secondary | ICD-10-CM

## 2014-08-30 DIAGNOSIS — M79606 Pain in leg, unspecified: Secondary | ICD-10-CM | POA: Insufficient documentation

## 2014-08-30 NOTE — Patient Instructions (Addendum)
This may be nerve irritation  Possibly from your back but since you have some tender veins in the area we are going to proceed with a doppler check to check for clotting in the veins.  If ok then we will plan on getting dr Vertell Limber to evaluate the burning pain . This could be a back related pain .

## 2014-08-30 NOTE — Progress Notes (Signed)
Pre visit review using our clinic review tool, if applicable. No additional management support is needed unless otherwise documented below in the visit note.  Chief Complaint  Patient presents with  . Left leg pain    Has soreness, numbness and a burning sensation in the left leg.    HPI: Patient [name] [name]  comes in today for SDA for  new problem evaluation. Here with grand child today. Remote hx of back surgery dr Vertell Limber with r leg sx. For a few months left lateral anterior thigh soreness and now over last week worse  without trauma or weakness but tender to touch has vv . ocass some lateral hip. Was hard to walk and get up ROS: See pertinent positives and negatives per HPI. No cp sob no fall last mri per dr Vertell Limber years ago . Told she had arthritis of  pelvic bones  Has taken aleve ibu was rough on stomach for pain   Past Medical History  Diagnosis Date  . GERD (gastroesophageal reflux disease)   . Hypertension   . Hyperlipidemia   . Thyroid disease   . Pulsatile tinnitus     had MRA and MPV nl mild carotid dopplers2010  . Hyperglycemia   . Headache(784.0)   . Cough     ent evaluation  . Abdominal pain      Neg ct [place] and mri of back   . DJD (degenerative joint disease) of lumbar spine   . Adenomatous colon polyp   . Allergy   . Anxiety   . History of positive PPD 10/16/2011  . Estrogen deficiency   . IBS (irritable bowel syndrome)   . Anemia   . Vulvitis   . HSV-2 infection   . Gastritis   . Colon polyp   . Shingles 02/09/2011    Right upper face and eyelid but not involving the eye at this point.   . Leg cramps 11/05/2013    Appears to have been from the higher dose of diuretic better on low dose and magnesium.     Family History  Problem Relation Age of Onset  . Alzheimer's disease Mother   . Heart attack Father   . Asthma Sister   . Colon cancer Sister   . Heart attack Brother   . Colon polyps Sister   . Colon cancer Sister   . Colon cancer Paternal  Aunt     History   Social History  . Marital Status: Single    Spouse Name: widowed.     Number of Children: Y  . Years of Education: N/A   Occupational History  . retired.     [employer]----now retired.  .     Social History Main Topics  . Smoking status: Former Smoker -- 0.50 packs/day for 23 years    Types: Cigarettes    Quit date: 06/16/1984  . Smokeless tobacco: Never Used  . Alcohol Use: No  . Drug Use: No  . Sexual Activity:    Partners: Male    Birth Control/ Protection: Surgical   Other Topics Concern  . Not on file   Social History Narrative   Retired    former smoker    widowed fall 2010 from myeloma. Lives alone.    [address]  No pets   Taking care of 12 month old GC    Outpatient Encounter Prescriptions as of 08/30/2014  Medication Sig  . acyclovir (ZOVIRAX) 400 MG tablet Take PO BID PRN  . Cholecalciferol (VITAMIN  D) 1000 UNITS capsule Take 1,000 Units by mouth daily as needed.   . cyanocobalamin 1000 MCG tablet Take 1,000 mcg by mouth daily.  . fluticasone (FLONASE) 50 MCG/ACT nasal spray INHALE 2 SPRAYS IN EACH NOSTRIL EVERY DAY FOR RHINITIS  . LORazepam (ATIVAN) 0.5 MG tablet Take 1 tablet (0.5 mg total) by mouth 2 (two) times daily as needed for anxiety.  Marland Kitchen omeprazole (PRILOSEC) 20 MG capsule Take 20 mg by mouth daily.   Marland Kitchen SYNTHROID 100 MCG tablet TAKE 1 TABLET BY MOUTH EVERY DAY  . triamterene-hydrochlorothiazide (MAXZIDE-25) 37.5-25 MG per tablet TAKE 1 TABLET BY MOUTH EVERY DAY  . [DISCONTINUED] fish oil-omega-3 fatty acids 1000 MG capsule Take 2 g by mouth daily.    . [DISCONTINUED] ibuprofen (ADVIL,MOTRIN) 600 MG tablet Take 600 mg by mouth every 6 (six) hours as needed for pain.    EXAM:  BP 124/70 mmHg  Temp(Src) 98.4 F (36.9 C) (Oral)  Ht 5\' 6"  (1.676 m)  Wt 214 lb 4.8 oz (97.206 kg)  BMI 34.61 kg/m2  Body mass index is 34.61 kg/(m^2). GENERAL: vitals reviewed and listed above, alert, oriented, appears well hydrated and in no acute  distress HEENT: atraumatic, conjunctiva  clear, no obvious abnormalities on inspection of external nose and ears  MS: moves all extremities without noticeable focal  Abnormality  Left antero latera some superficial varicosities  No redness ? Tender there  Back non tender  No rash  Localized pain to anterolateral thigh   No obv motor deficits toe heel walk  PSYCH: pleasant and cooperative, no obvious depression or anxiety  ASSESSMENT AND PLAN:  Discussed the following assessment and plan:  Leg pain, lateral, left - Plan: Lower Extremity Venous Duplex Left  Burning pain - Plan: Lower Extremity Venous Duplex Left  Varicose vein of leg - Plan: Lower Extremity Venous Duplex Left  History of back surgery Uncertain cause  prob not vascular but neuropathic pain probable wu as indicated  -Patient advised to return or notify health care team  if symptoms worsen ,persist or new concerns arise.  Patient Instructions  This may be nerve irritation  Possibly from your back but since you have some tender veins in the area we are going to proceed with a doppler check to check for clotting in the veins.  If ok then we will plan on getting dr Vertell Limber to evaluate the burning pain . This could be a back related pain .    Standley Brooking. Nely Dedmon M.D.

## 2014-08-31 ENCOUNTER — Ambulatory Visit (HOSPITAL_COMMUNITY): Payer: Medicare Other | Attending: Internal Medicine | Admitting: Cardiology

## 2014-08-31 DIAGNOSIS — M79605 Pain in left leg: Secondary | ICD-10-CM | POA: Diagnosis not present

## 2014-08-31 DIAGNOSIS — M7989 Other specified soft tissue disorders: Secondary | ICD-10-CM | POA: Insufficient documentation

## 2014-08-31 NOTE — Progress Notes (Signed)
Left lower venous duplex performed  

## 2014-09-01 ENCOUNTER — Other Ambulatory Visit: Payer: Self-pay | Admitting: Family Medicine

## 2014-09-01 ENCOUNTER — Ambulatory Visit (AMBULATORY_SURGERY_CENTER): Payer: Self-pay | Admitting: *Deleted

## 2014-09-01 VITALS — Ht 66.0 in | Wt 212.0 lb

## 2014-09-01 DIAGNOSIS — Z9889 Other specified postprocedural states: Secondary | ICD-10-CM

## 2014-09-01 DIAGNOSIS — M79605 Pain in left leg: Secondary | ICD-10-CM

## 2014-09-01 DIAGNOSIS — Z800 Family history of malignant neoplasm of digestive organs: Secondary | ICD-10-CM

## 2014-09-01 DIAGNOSIS — Z86010 Personal history of colonic polyps: Secondary | ICD-10-CM

## 2014-09-01 NOTE — Progress Notes (Signed)
No home 02 use, has OSA but no cpap, no egg or soy allergy, no issues with past sedation. ewm

## 2014-09-01 NOTE — Addendum Note (Signed)
Addended byBurnis Medin on: 09/01/2014 05:33 PM   Modules accepted: Orders

## 2014-09-15 ENCOUNTER — Encounter: Payer: Self-pay | Admitting: Internal Medicine

## 2014-09-15 ENCOUNTER — Ambulatory Visit (AMBULATORY_SURGERY_CENTER): Payer: Medicare Other | Admitting: Internal Medicine

## 2014-09-15 VITALS — BP 112/67 | HR 53 | Temp 97.5°F | Resp 17 | Ht 66.0 in | Wt 212.0 lb

## 2014-09-15 DIAGNOSIS — D124 Benign neoplasm of descending colon: Secondary | ICD-10-CM | POA: Diagnosis not present

## 2014-09-15 DIAGNOSIS — D125 Benign neoplasm of sigmoid colon: Secondary | ICD-10-CM

## 2014-09-15 DIAGNOSIS — D122 Benign neoplasm of ascending colon: Secondary | ICD-10-CM

## 2014-09-15 DIAGNOSIS — Z1211 Encounter for screening for malignant neoplasm of colon: Secondary | ICD-10-CM | POA: Diagnosis not present

## 2014-09-15 DIAGNOSIS — K635 Polyp of colon: Secondary | ICD-10-CM | POA: Diagnosis not present

## 2014-09-15 DIAGNOSIS — G4733 Obstructive sleep apnea (adult) (pediatric): Secondary | ICD-10-CM | POA: Diagnosis not present

## 2014-09-15 DIAGNOSIS — Z86010 Personal history of colonic polyps: Secondary | ICD-10-CM | POA: Diagnosis not present

## 2014-09-15 MED ORDER — SODIUM CHLORIDE 0.9 % IV SOLN: 500.0000 mL | INTRAVENOUS | Status: DC

## 2014-09-15 NOTE — Op Note (Signed)
[place]  [address] [place], [address]   COLONOSCOPY PROCEDURE REPORT  PATIENT: [name] [name], [name] [name]  MR#: [id number] BIRTHDATE: [birth year] , 7  yrs. old GENDER: female ENDOSCOPIST: Gatha Mayer, MD, [facility] PROCEDURE DATE:  09/15/2014 PROCEDURE:   Colonoscopy with snare polypectomy First Screening Colonoscopy - Avg.  risk and is 50 yrs.  old or older - No.  Prior Negative Screening - Now for repeat screening. N/A  History of Adenoma - Now for follow-up colonoscopy & has been > or = to 3 yrs.  Yes hx of adenoma.  Has been 3 or more years since last colonoscopy.  Polyps Removed Today? Yes. ASA CLASS:   Class II INDICATIONS:hx of adenomas and family history of colon cancer in 2 sisters and an aunt. MEDICATIONS: Propofol 250 mg IV and Monitored anesthesia care DESCRIPTION OF PROCEDURE:   After the risks benefits and alternatives of the procedure were thoroughly explained, informed consent was obtained.  The digital rectal exam revealed no abnormalities of the rectum.   The LB [id number]  endoscope was introduced through the anus and advanced to the cecum, which was identified by both the appendix and ileocecal valve. No adverse events experienced.   The quality of the prep was excellent, using MiraLax  The instrument was then slowly withdrawn as the colon was fully examined.  COLON FINDINGS: 1) Four polyps removed completely with cold snare, 2- 6 mm.  Ascending, descending (2) and sigmoid.  Sent to pathology. 2) Right-side diverticulosis. 3) Otherwise normal.  Retroflexed views revealed no abnormalities. The time to cecum=2 minutes 34 seconds.  Withdrawal time=13 minutes 37 seconds.  The scope was withdrawn and the procedure completed. COMPLICATIONS: There were no immediate complications. ENDOSCOPIC IMPRESSION: 1) Four polyps removed completely with cold snare, 2- 6 mm. Ascending, descending (2) and sigmoid.  Sent to pathology. 2) Right-side  diverticulosis. 3) Otherwise normal RECOMMENDATIONS: Timing of repeat colonoscopy will be determined by pathology findings. Likely 3 yrs eSigned:  Gatha Mayer, MD, [facility] 09/15/2014 9:15 AM cc: The Patient

## 2014-09-15 NOTE — Patient Instructions (Addendum)
I found and removed four polyps today - they all; look benign.  I will let you know pathology results and when to have another routine colonoscopy by mail.  I appreciate the opportunity to care for you. Gatha Mayer, MD, FACG  YOU HAD AN ENDOSCOPIC PROCEDURE TODAY AT [facility]: Refer to the procedure report that was given to you for any specific questions about what was found during the examination.  If the procedure report does not answer your questions, please call your gastroenterologist to clarify.  If you requested that your care partner not be given the details of your procedure findings, then the procedure report has been included in a sealed envelope for you to review at your convenience later.  YOU SHOULD EXPECT: Some feelings of bloating in the abdomen. Passage of more gas than usual.  Walking can help get rid of the air that was put into your GI tract during the procedure and reduce the bloating. If you had a lower endoscopy (such as a colonoscopy or flexible sigmoidoscopy) you may notice spotting of blood in your stool or on the toilet paper. If you underwent a bowel prep for your procedure, then you may not have a normal bowel movement for a few days.  DIET: Your first meal following the procedure should be a light meal and then it is ok to progress to your normal diet.  A half-sandwich or bowl of soup is an example of a good first meal.  Heavy or fried foods are harder to digest and may make you feel nauseous or bloated.  Likewise meals heavy in dairy and vegetables can cause extra gas to form and this can also increase the bloating.  Drink plenty of fluids but you should avoid alcoholic beverages for 24 hours.  ACTIVITY: Your care partner should take you home directly after the procedure.  You should plan to take it easy, moving slowly for the rest of the day.  You can resume normal activity the day after the procedure however you should NOT DRIVE or use  heavy machinery for 24 hours (because of the sedation medicines used during the test).    SYMPTOMS TO REPORT IMMEDIATELY: A gastroenterologist can be reached at any hour.  During normal business hours, 8:30 AM to 5:00 PM Monday through Friday, call [phone].  After hours and on weekends, please call the GI answering service at [phone] who will take a message and have the physician on call contact you.   Following lower endoscopy (colonoscopy or flexible sigmoidoscopy):  Excessive amounts of blood in the stool  Significant tenderness or worsening of abdominal pains  Swelling of the abdomen that is new, acute  Fever of 100F or higher  FOLLOW UP: If any biopsies were taken you will be contacted by phone or by letter within the next 1-3 weeks.  Call your gastroenterologist if you have not heard about the biopsies in 3 weeks.  Our staff will call the home number listed on your records the next business day following your procedure to check on you and address any questions or concerns that you may have at that time regarding the information given to you following your procedure. This is a courtesy call and so if there is no answer at the home number and we have not heard from you through the emergency physician on call, we will assume that you have returned to your regular daily activities without incident.  SIGNATURES/CONFIDENTIALITY: You and/or your  care partner have signed paperwork which will be entered into your electronic medical record.  These signatures attest to the fact that that the information above on your After Visit Summary has been reviewed and is understood.  Full responsibility of the confidentiality of this discharge information lies with you and/or your care-partner.

## 2014-09-15 NOTE — Progress Notes (Signed)
Called to room to assist during endoscopic procedure.  Patient ID and intended procedure confirmed with present staff. Received instructions for my participation in the procedure from the performing physician.  

## 2014-09-15 NOTE — Progress Notes (Signed)
A/ox3 pleased with MAC, report to Karen RN 

## 2014-09-16 ENCOUNTER — Telehealth: Payer: Self-pay | Admitting: *Deleted

## 2014-09-16 NOTE — Telephone Encounter (Signed)
  Follow up Call-  Call back number 09/15/2014  Post procedure Call Back phone  # [phone]  Permission to leave phone message Yes     Patient questions:  Do you have a fever, pain , or abdominal swelling? No. Pain Score  0 *  Have you tolerated food without any problems? Yes.    Have you been able to return to your normal activities? Yes.    Do you have any questions about your discharge instructions: Diet   No. Medications  No. Follow up visit  No.  Do you have questions or concerns about your Care? No.  Actions: * If pain score is 4 or above: No action needed, pain <4.

## 2014-09-22 ENCOUNTER — Encounter: Payer: Self-pay | Admitting: Internal Medicine

## 2014-09-22 DIAGNOSIS — Z86010 Personal history of colonic polyps: Secondary | ICD-10-CM

## 2014-09-22 NOTE — Progress Notes (Signed)
Quick Note:  2-3 adenomas - repeat colon 2019 ______

## 2014-10-12 DIAGNOSIS — G5712 Meralgia paresthetica, left lower limb: Secondary | ICD-10-CM | POA: Diagnosis not present

## 2014-10-12 DIAGNOSIS — Z6834 Body mass index (BMI) 34.0-34.9, adult: Secondary | ICD-10-CM | POA: Diagnosis not present

## 2014-10-20 DIAGNOSIS — M7541 Impingement syndrome of right shoulder: Secondary | ICD-10-CM | POA: Diagnosis not present

## 2014-11-09 ENCOUNTER — Other Ambulatory Visit: Payer: Self-pay | Admitting: Internal Medicine

## 2014-11-09 ENCOUNTER — Telehealth: Payer: Self-pay | Admitting: Family Medicine

## 2014-11-09 NOTE — Telephone Encounter (Signed)
Pt is now due for her yearly medicare wellness exam.  Please help her to make a fasting appt.  Thanks!

## 2014-11-09 NOTE — Telephone Encounter (Signed)
Sen to the pharmacy by e-scribe for 2 months.  Pt is now due for medicare wellness visit.  Will send a message to scheduling to help her make that appt.

## 2014-11-10 NOTE — Telephone Encounter (Signed)
lmom for pt to cb

## 2014-11-11 NOTE — Telephone Encounter (Signed)
Pt has been sch

## 2015-01-18 DIAGNOSIS — M7541 Impingement syndrome of right shoulder: Secondary | ICD-10-CM | POA: Diagnosis not present

## 2015-01-24 NOTE — Telephone Encounter (Signed)
error 

## 2015-01-25 ENCOUNTER — Ambulatory Visit (INDEPENDENT_AMBULATORY_CARE_PROVIDER_SITE_OTHER): Payer: Medicare Other | Admitting: Internal Medicine

## 2015-01-25 ENCOUNTER — Encounter: Payer: Self-pay | Admitting: Internal Medicine

## 2015-01-25 VITALS — BP 140/68 | Temp 98.6°F | Ht 65.5 in | Wt 212.0 lb

## 2015-01-25 DIAGNOSIS — R0982 Postnasal drip: Secondary | ICD-10-CM

## 2015-01-25 DIAGNOSIS — E785 Hyperlipidemia, unspecified: Secondary | ICD-10-CM

## 2015-01-25 DIAGNOSIS — Z0000 Encounter for general adult medical examination without abnormal findings: Secondary | ICD-10-CM | POA: Diagnosis not present

## 2015-01-25 DIAGNOSIS — E039 Hypothyroidism, unspecified: Secondary | ICD-10-CM

## 2015-01-25 DIAGNOSIS — G4733 Obstructive sleep apnea (adult) (pediatric): Secondary | ICD-10-CM

## 2015-01-25 DIAGNOSIS — R2689 Other abnormalities of gait and mobility: Secondary | ICD-10-CM

## 2015-01-25 DIAGNOSIS — Z79899 Other long term (current) drug therapy: Secondary | ICD-10-CM

## 2015-01-25 DIAGNOSIS — J329 Chronic sinusitis, unspecified: Secondary | ICD-10-CM

## 2015-01-25 DIAGNOSIS — R739 Hyperglycemia, unspecified: Secondary | ICD-10-CM | POA: Diagnosis not present

## 2015-01-25 DIAGNOSIS — I10 Essential (primary) hypertension: Secondary | ICD-10-CM

## 2015-01-25 LAB — BASIC METABOLIC PANEL
BUN: 24 mg/dL — ABNORMAL HIGH (ref 6–23)
CO2: 27 mEq/L (ref 19–32)
Calcium: 9.5 mg/dL (ref 8.4–10.5)
Chloride: 106 mEq/L (ref 96–112)
Creatinine, Ser: 0.78 mg/dL (ref 0.40–1.20)
GFR: 93.09 mL/min (ref >60.00)
Glucose, Bld: 91 mg/dL (ref 70–99)
Potassium: 3.9 mEq/L (ref 3.5–5.1)
Sodium: 139 mEq/L (ref 135–145)

## 2015-01-25 LAB — CBC WITH DIFFERENTIAL/PLATELET
Basophils Absolute: 0 10*3/uL (ref 0.0–0.1)
Basophils Relative: 0.7 % (ref 0.0–3.0)
Eosinophils Absolute: 0.2 10*3/uL (ref 0.0–0.7)
Eosinophils Relative: 3 % (ref 0.0–5.0)
HCT: 38.1 % (ref 36.0–46.0)
Hemoglobin: 12.4 g/dL (ref 12.0–15.0)
Lymphocytes Relative: 45.8 % (ref 12.0–46.0)
Lymphs Abs: 2.5 10*3/uL (ref 0.7–4.0)
MCHC: 32.5 g/dL (ref 30.0–36.0)
MCV: 89.4 fl (ref 78.0–100.0)
Monocytes Absolute: 0.4 10*3/uL (ref 0.1–1.0)
Monocytes Relative: 8.2 % (ref 3.0–12.0)
Neutro Abs: 2.3 10*3/uL (ref 1.4–7.7)
Neutrophils Relative %: 42.3 % — ABNORMAL LOW (ref 43.0–77.0)
Platelets: 211 10*3/uL (ref 150.0–400.0)
RBC: 4.26 Mil/uL (ref 3.87–5.11)
RDW: 13.9 % (ref 11.5–15.5)
WBC: 5.4 10*3/uL (ref 4.0–10.5)

## 2015-01-25 LAB — HEPATIC FUNCTION PANEL
ALT: 15 U/L (ref 0–35)
AST: 23 U/L (ref 0–37)
Albumin: 4.1 g/dL (ref 3.5–5.2)
Alkaline Phosphatase: 44 U/L (ref 39–117)
Bilirubin, Direct: 0.1 mg/dL (ref 0.0–0.3)
Total Bilirubin: 0.5 mg/dL (ref 0.2–1.2)
Total Protein: 7 g/dL (ref 6.0–8.3)

## 2015-01-25 LAB — LIPID PANEL
Cholesterol: 195 mg/dL (ref 0–200)
HDL: 46.5 mg/dL (ref >39.00)
LDL Cholesterol: 119 mg/dL — ABNORMAL HIGH (ref 0–99)
NonHDL: 148.5
Total CHOL/HDL Ratio: 4
Triglycerides: 147 mg/dL (ref 0.0–149.0)
VLDL: 29.4 mg/dL (ref 0.0–40.0)

## 2015-01-25 LAB — HEMOGLOBIN A1C: Hgb A1c MFr Bld: 5.9 % (ref 4.6–6.5)

## 2015-01-25 LAB — TSH: TSH: 0.62 u[IU]/mL (ref 0.35–4.50)

## 2015-01-25 NOTE — Patient Instructions (Addendum)
Continue lifestyle intervention healthy eating and exercise . Goal for bp is below 140/90 although 145 is acceptable   Healthy lifestyle includes : At least 150 minutes of exercise weeks  , weight at healthy levels, which is usually   BMI 19-25. Avoid trans fats and processed foods;  Increase fresh fruits and veges to 5 servings per day. And avoid sweet beverages including tea and juice. Mediterranean diet with olive oil and nuts have been noted to be heart and brain healthy . Avoid tobacco products . Limit  alcohol to  7 per week for women and 14 servings for men.  Get adequate sleep . Wear seat belts . Don't text and drive .   Yearly flu vaccine Will notify you  of labs when available. Appoint [facility] advanced directive  .Marland KitchenMarland KitchenMarland Kitchen

## 2015-01-25 NOTE — Progress Notes (Signed)
Pre visit review using our clinic review tool, if applicable. No additional management support is needed unless otherwise documented below in the visit note.  Chief Complaint  Patient presents with  . Medicare Wellness    HPI: [name] [name] 74 y.o. comes in today for Preventive Medicare wellness visit . And Chronic disease management couple of other issues.   Bp  About   120 range taking medication  Thyroid :   Taking med   LIPIDS: Tries to eat healthy.  Taking ibu 600 daily for shoulder at time .   After Medicare wellness and disease evaluation patient has questions about recurrent chronic right sided postnasal drainage without fever or vision changes most recently some headache on that side possible sinus has used Flonase in the past but not recently. She also states for year at least or more she complains of what she calls staggering which means if she is walking a long distance she doesn't tenderness a in a straight line. Denies any vision or hearing problems or true vertigo. Denies specific neuropathy. No falling. Has been able to take care of 35-year-old grandchild as of recently stopping. She is under care for right shoulder predicament and to get an MRI of this because of continued pain interfering with sleep.  Health Maintenance  Topic Date Due  . INFLUENZA VACCINE  03/13/2015  . MAMMOGRAM  01/20/2016  . COLONOSCOPY  09/15/2017  . TETANUS/TDAP  06/23/2022  . DEXA SCAN  Completed  . ZOSTAVAX  Completed  . PNA vac Low Risk Adult  Completed   Health Maintenance Review LIFESTYLE:  Exercise:   Walking not much planning to do more.  Tobacco/ETS: Alcohol: per day no Sugar beverages:no Sleep:  Varies  3-4    osa   Mild Sleep  clance  Drug use: no  Colonoscopy:  2016  MEDICARE DOCUMENT QUESTIONS  TO SCAN     Hearing: ok  Vision:  No limitations at present . Last eye check UTD  Safety:  Has smoke detector and wears seat belts.  No firearms. No excess sun  exposure. Sees dentist regularly.   Falls: no  Advance directive :  Reviewed  Doesn't have one .  Memory:  Still ocnern about this but no change or prob from family   Depression: No anhedonia unusual crying or depressive symptoms  Nutrition: Eats well balanced diet; adequate calcium and vitamin D. No swallowing chewing problems.  Injury: no major injuries in the last six months.  Other healthcare providers:  Reviewed today .  Social: widowed  No pets.   Preventive parameters: up-to-date  Reviewed   ADLS:   There are no problems or need for assistance  driving, feeding, obtaining food, dressing, toileting and bathing, managing money using phone. She is independent.  EXERCISE/ HABITS  Per week   No tobacco    etoh   ROS:  GEN/ HEENT: No fever, significant weight changes sweats headaches vision problems hearing changes, CV/ PULM; No chest pain shortness of breath cough, syncope,edema  change in exercise tolerance. GI /GU: No adominal pain, vomiting, change in bowel habits. No blood in the stool. No significant GU symptoms. SKIN/HEME: ,no acute skin rashes suspicious lesions or bleeding. No lymphadenopathy, nodules, masses.  NEURO/ PSYCH:  No neurologic signs such as weakness numbness. No depression anxiety. IMM/ Allergy: No unusual infections.  Allergy .   REST of 12 system review negative except as per HPI   Past Medical History  Diagnosis Date  . GERD (gastroesophageal reflux  disease)   . Hypertension   . Hyperlipidemia   . Thyroid disease   . Pulsatile tinnitus     had MRA and MPV nl mild carotid dopplers2010  . Hyperglycemia   . Headache(784.0)   . Cough     ent evaluation  . Abdominal pain      Neg ct [place] and mri of back   . DJD (degenerative joint disease) of lumbar spine   . Adenomatous colon polyp   . Allergy   . Anxiety   . History of positive PPD 10/16/2011  . Estrogen deficiency   . IBS (irritable bowel syndrome)   . Anemia   . Vulvitis   . HSV-2  infection   . Gastritis   . Colon polyp   . Shingles 02/09/2011    Right upper face and eyelid but not involving the eye at this point.   . Leg cramps 11/05/2013    Appears to have been from the higher dose of diuretic better on low dose and magnesium.   . Sleep apnea     does not use c pap  . Helicobacter pylori infection 10/13/2007    Qualifier: Diagnosis of  By: Marland Mcalpine      Family History  Problem Relation Age of Onset  . Alzheimer's disease Mother   . Heart attack Father   . Asthma Sister   . Colon cancer Sister   . Heart attack Brother   . Colon polyps Sister   . Colon cancer Sister   . Colon cancer Paternal Aunt   . Stomach cancer Neg Hx     History   Social History  . Marital Status: Single    Spouse Name: widowed.   . Number of Children: Y  . Years of Education: N/A   Occupational History  . retired.     [employer]----now retired.  .     Social History Main Topics  . Smoking status: Former Smoker -- 0.50 packs/day for 23 years    Types: Cigarettes    Quit date: 06/16/1984  . Smokeless tobacco: Never Used  . Alcohol Use: No  . Drug Use: No  . Sexual Activity:    Partners: Male    Birth Control/ Protection: Surgical   Other Topics Concern  . None   Social History Narrative   Retired    former smoker    widowed fall 2010 from myeloma. Lives alone.    Hhof [address]  No pets   Taking care of  21 yo gc and now newborn in the fall     takign time off of child care     Outpatient Encounter Prescriptions as of 01/25/2015  Medication Sig  . acyclovir (ZOVIRAX) 400 MG tablet Take PO BID PRN  . Cholecalciferol (VITAMIN D) 1000 UNITS capsule Take 1,000 Units by mouth daily as needed.   . cyanocobalamin 1000 MCG tablet Take 1,000 mcg by mouth daily.  . fluticasone (FLONASE) 50 MCG/ACT nasal spray INHALE 2 SPRAYS IN EACH NOSTRIL EVERY DAY FOR RHINITIS  . omeprazole (PRILOSEC) 20 MG capsule Take 20 mg by mouth daily.   Marland Kitchen SYNTHROID 100 MCG tablet TAKE 1 TABLET BY  MOUTH EVERY DAY  . triamterene-hydrochlorothiazide (MAXZIDE-25) 37.5-25 MG per tablet TAKE 1 TABLET BY MOUTH EVERY DAY  . [DISCONTINUED] LORazepam (ATIVAN) 0.5 MG tablet Take 1 tablet (0.5 mg total) by mouth 2 (two) times daily as needed for anxiety.  . [DISCONTINUED] Naproxen Sodium (ALEVE PO) Take 1 tablet by mouth as  needed.  Marland Kitchen ibuprofen (ADVIL,MOTRIN) 600 MG tablet 1 po qd as needed   No facility-administered encounter medications on file as of 01/25/2015.    EXAM:  BP 140/68 mmHg  Temp(Src) 98.6 F (37 C) (Oral)  Ht 5' 5.5" (1.664 m)  Wt 212 lb (96.163 kg)  BMI 34.73 kg/m2  Body mass index is 34.73 kg/(m^2).  Physical Exam: Vital signs reviewed ZOX:WRUE is a well-developed well-nourished alert cooperative   who appears stated age in no acute distress. Looks minimally congested HEENT: normocephalic atraumatic , Eyes: PERRL EOM's full, conjunctiva clear, Nares: paten,t no deformity discharge or tenderness., Ears: no deformity EAC's clear TMs with normal landmarks. Face nontender Mouth: clear OP, no lesions, edema.  Moist mucous membranes. Dentition in adequate repair. NECK: supple without masses, thyromegaly or bruits. CHEST/PULM:  Clear to auscultation and percussion breath sounds equal no wheeze , rales or rhonchi. No chest wall deformities or tenderness.Breast: normal by inspection . No dimpling, discharge, masses, tenderness or discharge . CV: PMI is nondisplaced, S1 S2 no gallops, murmurs, rubs. Peripheral pulses are present without delay.No JVD .  ABDOMEN: Bowel sounds normal nontender  No guard or rebound, no hepato splenomegal no CVA tenderness.  No hernia. Extremtities:  No clubbing cyanosis or edema, no acute joint swelling or redness no focal atrophy NEURO:  Oriented x3, cranial nerves 3-12 appear to be intact, no obvious focal weakness,gait grossly within normal limits no obvious weakness. SKIN: No acute rashes normal turgor, color, no bruising or petechiae. PSYCH:  Oriented, good eye contact, no obvious depression anxiety, cognition and judgment appear normal. LN: no cervical axillary inguinal adenopathy No noted deficits in memory, attention, and speech.       ASSESSMENT AND PLAN:  Discussed the following assessment and plan:  Medicare annual wellness visit, subsequent - Handout on advance directives discussion.  Essential hypertension - Reported controlled at home - Plan: Basic metabolic panel, Lipid panel, TSH, Hemoglobin A1c, CBC with Differential/Platelet, Hepatic function panel  Hypothyroidism, unspecified hypothyroidism type - Taking medicine daily - Plan: Basic metabolic panel, Lipid panel, TSH, Hemoglobin A1c, CBC with Differential/Platelet, Hepatic function panel  Hyperglycemia - Plan: Basic metabolic panel, Lipid panel, TSH, Hemoglobin A1c, CBC with Differential/Platelet, Hepatic function panel  Hyperlipidemia - Plan: Basic metabolic panel, Lipid panel, TSH, Hemoglobin A1c, CBC with Differential/Platelet, Hepatic function panel  High risk medication use -  daily ibuprofen because of shoulder predicament check renal function and liver tests - Plan: Hepatic function panel  Post-nasal drainage - right isde  now with some ha  no acute finding flonase back for 2 weeks and if ongoing plan rov   Imbalance feeling chornic? - exams unremarkable  no ob neuropathy or cns sx  will follow and can do rov if concerning ? if med effect.   OSA (obstructive sleep apnea) - mild no intervnetion except weight loss advised  Advice make follow-up appointment if recurrent right-sided head pain or problem with balance etc. concerning consider further workup. Patient Care Team: Burnis Medin, MD as PCP - General Erline Levine, MD (Neurosurgery) Jerrell Belfast, MD Kathee Delton, MD (Pulmonary Disease) Eldred Manges, MD (Obstetrics and Gynecology) Gatha Mayer, MD (Gastroenterology) Roseanne Kaufman, MD as Consulting Physician (Orthopedic  Surgery) Justice Britain, MD as Consulting Physician (Orthopedic Surgery)  Patient Instructions  Continue lifestyle intervention healthy eating and exercise . Goal for bp is below 140/90 although 145 is acceptable   Healthy lifestyle includes : At least 150 minutes of exercise weeks  ,  weight at healthy levels, which is usually   BMI 19-25. Avoid trans fats and processed foods;  Increase fresh fruits and veges to 5 servings per day. And avoid sweet beverages including tea and juice. Mediterranean diet with olive oil and nuts have been noted to be heart and brain healthy . Avoid tobacco products . Limit  alcohol to  7 per week for women and 14 servings for men.  Get adequate sleep . Wear seat belts . Don't text and drive .   Yearly flu vaccine Will notify you  of labs when available. Appoint HCPOA advanced directive  ....    Standley Brooking. Panosh M.D.

## 2015-01-27 ENCOUNTER — Other Ambulatory Visit: Payer: Self-pay

## 2015-01-27 DIAGNOSIS — Z1231 Encounter for screening mammogram for malignant neoplasm of breast: Secondary | ICD-10-CM

## 2015-01-30 DIAGNOSIS — M7541 Impingement syndrome of right shoulder: Secondary | ICD-10-CM | POA: Diagnosis not present

## 2015-02-06 DIAGNOSIS — M7541 Impingement syndrome of right shoulder: Secondary | ICD-10-CM | POA: Diagnosis not present

## 2015-02-21 DIAGNOSIS — M66821 Spontaneous rupture of other tendons, right upper arm: Secondary | ICD-10-CM | POA: Diagnosis not present

## 2015-02-21 DIAGNOSIS — M94211 Chondromalacia, right shoulder: Secondary | ICD-10-CM | POA: Diagnosis not present

## 2015-02-21 DIAGNOSIS — M7541 Impingement syndrome of right shoulder: Secondary | ICD-10-CM | POA: Diagnosis not present

## 2015-02-21 DIAGNOSIS — S43431A Superior glenoid labrum lesion of right shoulder, initial encounter: Secondary | ICD-10-CM | POA: Diagnosis not present

## 2015-02-21 DIAGNOSIS — M24111 Other articular cartilage disorders, right shoulder: Secondary | ICD-10-CM | POA: Diagnosis not present

## 2015-02-21 DIAGNOSIS — M75121 Complete rotator cuff tear or rupture of right shoulder, not specified as traumatic: Secondary | ICD-10-CM | POA: Diagnosis not present

## 2015-02-21 DIAGNOSIS — M75101 Unspecified rotator cuff tear or rupture of right shoulder, not specified as traumatic: Secondary | ICD-10-CM | POA: Diagnosis not present

## 2015-02-21 DIAGNOSIS — G8918 Other acute postprocedural pain: Secondary | ICD-10-CM | POA: Diagnosis not present

## 2015-02-22 ENCOUNTER — Ambulatory Visit: Payer: 59

## 2015-02-27 DIAGNOSIS — M75111 Incomplete rotator cuff tear or rupture of right shoulder, not specified as traumatic: Secondary | ICD-10-CM | POA: Diagnosis not present

## 2015-02-27 DIAGNOSIS — Z4789 Encounter for other orthopedic aftercare: Secondary | ICD-10-CM | POA: Diagnosis not present

## 2015-03-02 DIAGNOSIS — M75111 Incomplete rotator cuff tear or rupture of right shoulder, not specified as traumatic: Secondary | ICD-10-CM | POA: Diagnosis not present

## 2015-03-07 DIAGNOSIS — M75111 Incomplete rotator cuff tear or rupture of right shoulder, not specified as traumatic: Secondary | ICD-10-CM | POA: Diagnosis not present

## 2015-03-09 DIAGNOSIS — M75111 Incomplete rotator cuff tear or rupture of right shoulder, not specified as traumatic: Secondary | ICD-10-CM | POA: Diagnosis not present

## 2015-03-13 ENCOUNTER — Ambulatory Visit: Payer: 59

## 2015-03-14 DIAGNOSIS — M75111 Incomplete rotator cuff tear or rupture of right shoulder, not specified as traumatic: Secondary | ICD-10-CM | POA: Diagnosis not present

## 2015-03-23 DIAGNOSIS — M75111 Incomplete rotator cuff tear or rupture of right shoulder, not specified as traumatic: Secondary | ICD-10-CM | POA: Diagnosis not present

## 2015-03-27 DIAGNOSIS — Z4789 Encounter for other orthopedic aftercare: Secondary | ICD-10-CM | POA: Diagnosis not present

## 2015-03-27 DIAGNOSIS — M75111 Incomplete rotator cuff tear or rupture of right shoulder, not specified as traumatic: Secondary | ICD-10-CM | POA: Diagnosis not present

## 2015-04-03 DIAGNOSIS — M75111 Incomplete rotator cuff tear or rupture of right shoulder, not specified as traumatic: Secondary | ICD-10-CM | POA: Diagnosis not present

## 2015-04-05 DIAGNOSIS — M75111 Incomplete rotator cuff tear or rupture of right shoulder, not specified as traumatic: Secondary | ICD-10-CM | POA: Diagnosis not present

## 2015-04-11 DIAGNOSIS — M75111 Incomplete rotator cuff tear or rupture of right shoulder, not specified as traumatic: Secondary | ICD-10-CM | POA: Diagnosis not present

## 2015-04-13 DIAGNOSIS — M75111 Incomplete rotator cuff tear or rupture of right shoulder, not specified as traumatic: Secondary | ICD-10-CM | POA: Diagnosis not present

## 2015-04-18 ENCOUNTER — Ambulatory Visit
Admission: RE | Admit: 2015-04-18 | Discharge: 2015-04-18 | Disposition: A | Discharge status: Home / Self Care | Payer: Medicare Other | Source: Ambulatory Visit

## 2015-04-18 DIAGNOSIS — Z1231 Encounter for screening mammogram for malignant neoplasm of breast: Secondary | ICD-10-CM | POA: Diagnosis not present

## 2015-04-19 DIAGNOSIS — M75111 Incomplete rotator cuff tear or rupture of right shoulder, not specified as traumatic: Secondary | ICD-10-CM | POA: Diagnosis not present

## 2015-05-01 DIAGNOSIS — M75111 Incomplete rotator cuff tear or rupture of right shoulder, not specified as traumatic: Secondary | ICD-10-CM | POA: Diagnosis not present

## 2015-05-01 DIAGNOSIS — Z4789 Encounter for other orthopedic aftercare: Secondary | ICD-10-CM | POA: Diagnosis not present

## 2015-05-04 DIAGNOSIS — M75111 Incomplete rotator cuff tear or rupture of right shoulder, not specified as traumatic: Secondary | ICD-10-CM | POA: Diagnosis not present

## 2015-05-09 DIAGNOSIS — M75111 Incomplete rotator cuff tear or rupture of right shoulder, not specified as traumatic: Secondary | ICD-10-CM | POA: Diagnosis not present

## 2015-05-11 DIAGNOSIS — M75111 Incomplete rotator cuff tear or rupture of right shoulder, not specified as traumatic: Secondary | ICD-10-CM | POA: Diagnosis not present

## 2015-05-16 DIAGNOSIS — M75111 Incomplete rotator cuff tear or rupture of right shoulder, not specified as traumatic: Secondary | ICD-10-CM | POA: Diagnosis not present

## 2015-05-18 ENCOUNTER — Ambulatory Visit (INDEPENDENT_AMBULATORY_CARE_PROVIDER_SITE_OTHER): Payer: Medicare Other

## 2015-05-18 DIAGNOSIS — Z23 Encounter for immunization: Secondary | ICD-10-CM

## 2015-05-18 DIAGNOSIS — M75111 Incomplete rotator cuff tear or rupture of right shoulder, not specified as traumatic: Secondary | ICD-10-CM | POA: Diagnosis not present

## 2015-05-25 ENCOUNTER — Ambulatory Visit (INDEPENDENT_AMBULATORY_CARE_PROVIDER_SITE_OTHER): Payer: Medicare Other | Admitting: Internal Medicine

## 2015-05-25 ENCOUNTER — Encounter: Payer: Self-pay | Admitting: Internal Medicine

## 2015-05-25 VITALS — BP 140/70 | Temp 98.7°F | Ht 65.5 in | Wt 213.0 lb

## 2015-05-25 DIAGNOSIS — R058 Other specified cough: Secondary | ICD-10-CM

## 2015-05-25 DIAGNOSIS — R221 Localized swelling, mass and lump, neck: Secondary | ICD-10-CM | POA: Diagnosis not present

## 2015-05-25 DIAGNOSIS — R05 Cough: Secondary | ICD-10-CM

## 2015-05-25 DIAGNOSIS — R198 Other specified symptoms and signs involving the digestive system and abdomen: Secondary | ICD-10-CM

## 2015-05-25 DIAGNOSIS — R6889 Other general symptoms and signs: Secondary | ICD-10-CM

## 2015-05-25 DIAGNOSIS — E042 Nontoxic multinodular goiter: Secondary | ICD-10-CM | POA: Diagnosis not present

## 2015-05-25 NOTE — Patient Instructions (Signed)
Will arrange   ENT consult for good exam  Also  ultrasound of neck  Thyroid area . Will be contacted about.  These appts.

## 2015-05-25 NOTE — Progress Notes (Signed)
Pre visit review using our clinic review tool, if applicable. No additional management support is needed unless otherwise documented below in the visit note.  Chief Complaint  Patient presents with  . Mass    HPI: [name] [name] 74 y.o.  Comes in for sda   for new problem visit. Over the last week she has felt some fullness or lump in her left lower neck. Over the last couple months she has felt some left-sided throat irritation like something in her throat without associated history of choking or true dysphagia. She also states that she is "Cold all summer" and has throat clearing postnasal drainage sometimes discolored without fever or face pain. She has a history of right thyroidectomy over 20 years ago for some type of goiter. She had an ultrasound on review of her record in about 2008 or so that showed a multinodular goiter and she had a thyroid biopsy. Uncertain after that who followed her but she has seen Dr. Wilburn Cornelia in the past for something ENT related. Her last thyroid tests were within 6 months and normal. She is on Synthroid. ROS: See pertinent positives and negatives per HPI. No real chest pain shortness of breath or wheezing.  Past Medical History  Diagnosis Date  . GERD (gastroesophageal reflux disease)   . Hypertension   . Hyperlipidemia   . Thyroid disease   . Pulsatile tinnitus     had MRA and MPV nl mild carotid dopplers2010  . Hyperglycemia   . Headache(784.0)   . Cough     ent evaluation  . Abdominal pain      Neg ct [place] and mri of back   . DJD (degenerative joint disease) of lumbar spine   . Adenomatous colon polyp   . Allergy   . Anxiety   . History of positive PPD 10/16/2011  . Estrogen deficiency   . IBS (irritable bowel syndrome)   . Anemia   . Vulvitis   . HSV-2 infection   . Gastritis   . Colon polyp   . Shingles 02/09/2011    Right upper face and eyelid but not involving the eye at this point.   . Leg cramps 11/05/2013    Appears to have been  from the higher dose of diuretic better on low dose and magnesium.   . Sleep apnea     does not use c pap  . Helicobacter pylori infection 10/13/2007    Qualifier: Diagnosis of  By: Marland Mcalpine      Family History  Problem Relation Age of Onset  . Alzheimer's disease Mother   . Heart attack Father   . Asthma Sister   . Colon cancer Sister   . Heart attack Brother   . Colon polyps Sister   . Colon cancer Sister   . Colon cancer Paternal Aunt   . Stomach cancer Neg Hx     Social History   Social History  . Marital Status: Single    Spouse Name: widowed.   . Number of Children: Y  . Years of Education: N/A   Occupational History  . retired.     [employer]----now retired.  .     Social History Main Topics  . Smoking status: Former Smoker -- 0.50 packs/day for 23 years    Types: Cigarettes    Quit date: 06/16/1984  . Smokeless tobacco: Never Used  . Alcohol Use: No  . Drug Use: No  . Sexual Activity:    Partners: Male  Birth Control/ Protection: Surgical   Other Topics Concern  . None   Social History Narrative   Retired    former smoker    widowed fall 2010 from myeloma. Lives alone.    Hhof [address]  No pets   Taking care of  70 yo gc and now newborn in the fall     takign time off of child care     Outpatient Prescriptions Prior to Visit  Medication Sig Dispense Refill  . acyclovir (ZOVIRAX) 400 MG tablet Take PO BID PRN 60 tablet 12  . Cholecalciferol (VITAMIN D) 1000 UNITS capsule Take 1,000 Units by mouth daily as needed.     . cyanocobalamin 1000 MCG tablet Take 1,000 mcg by mouth daily.    . fluticasone (FLONASE) 50 MCG/ACT nasal spray INHALE 2 SPRAYS IN EACH NOSTRIL EVERY DAY FOR RHINITIS 16 g 1  . ibuprofen (ADVIL,MOTRIN) 600 MG tablet 1 po qd as needed 1 tablet 0  . SYNTHROID 100 MCG tablet TAKE 1 TABLET BY MOUTH EVERY DAY 90 tablet 2  . triamterene-hydrochlorothiazide (MAXZIDE-25) 37.5-25 MG per tablet TAKE 1 TABLET BY MOUTH EVERY DAY 90 tablet 2  .  omeprazole (PRILOSEC) 20 MG capsule Take 20 mg by mouth daily.      No facility-administered medications prior to visit.     EXAM:  BP 140/70 mmHg  Temp(Src) 98.7 F (37.1 C) (Oral)  Ht 5' 5.5" (1.664 m)  Wt 213 lb (96.616 kg)  BMI 34.89 kg/m2  Body mass index is 34.89 kg/(m^2).  GENERAL: vitals reviewed and listed above, alert, oriented, appears well hydrated and in no acute distress she looks mildly congested nontoxic and well HEENT: atraumatic, conjunctiva  clear, no obvious abnormalities on inspection of external nose and ears TMs are clear OP : no lesion edema or exudate some deep crypts in the left tonsillar fossa but I don't see any masses. NECK:  Supple no obvious adenopathy there is a fullness that I think is her thyroid gland in the left lower neck as she reports the area of concern. LUNGS: clear to auscultation bilaterally, no wheezes, rales or rhonchi, CV: HRRR, no clubbing cyanosis or  nl cap refill  MS: moves all extremities without noticeable focal  abnormality PSYCH: pleasant and cooperative, no obvious depression or anxiety Lab Results  Component Value Date   WBC 5.4 01/25/2015   HGB 12.4 01/25/2015   HCT 38.1 01/25/2015   PLT 211.0 01/25/2015   GLUCOSE 91 01/25/2015   CHOL 195 01/25/2015   TRIG 147.0 01/25/2015   HDL 46.50 01/25/2015   LDLDIRECT 131.1 08/09/2009   LDLCALC 119* 01/25/2015   ALT 15 01/25/2015   AST 23 01/25/2015   NA 139 01/25/2015   K 3.9 01/25/2015   CL 106 01/25/2015   CREATININE 0.78 01/25/2015   BUN 24* 01/25/2015   CO2 27 01/25/2015   TSH 0.62 01/25/2015   INR 0.9 04/14/2007   HGBA1C 5.9 01/25/2015    ASSESSMENT AND PLAN:  Discussed the following assessment and plan:  Mass of left side of neck - Probable thyroid gland plan ultrasound of neck ENT evaluation  Throat symptom left - Left-sided  Respiratory tract congestion with cough and throat clearing - Ongoing question allergic question reflux refer for thorough ENT  exam  Multinodular goiter - History of same with the remote right partial thyroidectomy dont see the pathology from thyroid bx done in past   Has seen dr Dellie Burns r in past for something remote -Patient advised to  return or notify health care team  if symptoms worsen ,persist or new concerns arise.  Patient Instructions  Will arrange   ENT consult for good exam  Also  ultrasound of neck  Thyroid area . Will be contacted about.  These appts.          Standley Brooking. Angelisse Riso M.D.

## 2015-05-31 DIAGNOSIS — Z4789 Encounter for other orthopedic aftercare: Secondary | ICD-10-CM | POA: Diagnosis not present

## 2015-06-01 ENCOUNTER — Ambulatory Visit
Admission: RE | Admit: 2015-06-01 | Discharge: 2015-06-01 | Disposition: A | Discharge status: Home / Self Care | Payer: Medicare Other | Source: Ambulatory Visit | Attending: Internal Medicine | Admitting: Internal Medicine

## 2015-06-01 DIAGNOSIS — R221 Localized swelling, mass and lump, neck: Secondary | ICD-10-CM

## 2015-06-01 DIAGNOSIS — E042 Nontoxic multinodular goiter: Secondary | ICD-10-CM | POA: Diagnosis not present

## 2015-06-08 ENCOUNTER — Other Ambulatory Visit: Payer: Self-pay | Admitting: Family Medicine

## 2015-06-08 DIAGNOSIS — E042 Nontoxic multinodular goiter: Secondary | ICD-10-CM

## 2015-06-13 DIAGNOSIS — K219 Gastro-esophageal reflux disease without esophagitis: Secondary | ICD-10-CM | POA: Diagnosis not present

## 2015-06-13 DIAGNOSIS — R05 Cough: Secondary | ICD-10-CM | POA: Diagnosis not present

## 2015-06-13 DIAGNOSIS — E041 Nontoxic single thyroid nodule: Secondary | ICD-10-CM | POA: Diagnosis not present

## 2015-06-13 DIAGNOSIS — F458 Other somatoform disorders: Secondary | ICD-10-CM | POA: Diagnosis not present

## 2015-06-14 ENCOUNTER — Encounter: Payer: Self-pay | Admitting: Endocrinology

## 2015-06-14 ENCOUNTER — Ambulatory Visit (INDEPENDENT_AMBULATORY_CARE_PROVIDER_SITE_OTHER): Payer: Medicare Other | Admitting: Endocrinology

## 2015-06-14 VITALS — BP 122/84 | HR 58 | Temp 98.4°F | Ht 65.5 in | Wt 212.2 lb

## 2015-06-14 DIAGNOSIS — E890 Postprocedural hypothyroidism: Secondary | ICD-10-CM

## 2015-06-14 DIAGNOSIS — E042 Nontoxic multinodular goiter: Secondary | ICD-10-CM | POA: Diagnosis not present

## 2015-06-14 NOTE — Patient Instructions (Signed)
It is extremely unlikely that the thyroid nodules are harmful to you. Please continue the same levothyroxine. You should have the ultrasound rechecked in 2 years.

## 2015-06-14 NOTE — Progress Notes (Signed)
Subjective:    Patient ID: [name] [name], female    DOB: May 20, 1941, 74 y.o.   MRN: [id number]  HPI Pt had a right thyroid lobectomy in approx 2003.  pathol was benign.  She has been on synthroid since then.  In 2008, she was noted to have a nodule in the remaining left lobe, and had a biopsy.  Result was "hyperplastic follicular lesion."  she has no h/o XRT to the neck.   She has slight fullness at the left anterior neck, but no assoc pain.  She saw Dr Wilburn Cornelia, who told her the sxs are not thyroid-related.  Past Medical History  Diagnosis Date  . GERD (gastroesophageal reflux disease)   . Hypertension   . Hyperlipidemia   . Thyroid disease   . Pulsatile tinnitus     had MRA and MPV nl mild carotid dopplers2010  . Hyperglycemia   . Headache(784.0)   . Cough     ent evaluation  . Abdominal pain      Neg ct [place] and mri of back   . DJD (degenerative joint disease) of lumbar spine   . Adenomatous colon polyp   . Allergy   . Anxiety   . History of positive PPD 10/16/2011  . Estrogen deficiency   . IBS (irritable bowel syndrome)   . Anemia   . Vulvitis   . HSV-2 infection   . Gastritis   . Colon polyp   . Shingles 02/09/2011    Right upper face and eyelid but not involving the eye at this point.   . Leg cramps 11/05/2013    Appears to have been from the higher dose of diuretic better on low dose and magnesium.   . Sleep apnea     does not use c pap  . Helicobacter pylori infection 10/13/2007    Qualifier: Diagnosis of  By: Marland Mcalpine      Past Surgical History  Procedure Laterality Date  . Abdominal hysterectomy  1982    fibroids  . Lumbar spine surgery  05/1989    spine d/t tumor neurofibroma Quentin Cornwall  . Shoulder arthroscopy      left  . Thyroidectomy      partial rt  ballen  . Isthmuectomy      thyroid surgery  . Tonsillectomy      at age 37.   . Tubal ligation  1974  . Finger surgery      left middle A1pulley release  . Colonoscopy  2003- 2102   adenomatous polyps    Social History   Social History  . Marital Status: Single    Spouse Name: widowed.   . Number of Children: Y  . Years of Education: N/A   Occupational History  . retired.     [employer]----now retired.  .     Social History Main Topics  . Smoking status: Former Smoker -- 0.50 packs/day for 23 years    Types: Cigarettes    Quit date: 06/16/1984  . Smokeless tobacco: Never Used  . Alcohol Use: No  . Drug Use: No  . Sexual Activity:    Partners: Male    Birth Control/ Protection: Surgical   Other Topics Concern  . Not on file   Social History Narrative   Retired    former smoker    widowed fall 2010 from myeloma. Lives alone.    Hhof [address]  No pets   Taking care of  77 yo gc and now newborn in  the fall     takign time off of child care     Current Outpatient Prescriptions on File Prior to Visit  Medication Sig Dispense Refill  . acyclovir (ZOVIRAX) 400 MG tablet Take PO BID PRN 60 tablet 12  . Cholecalciferol (VITAMIN D) 1000 UNITS capsule Take 1,000 Units by mouth daily as needed.     . cyanocobalamin 1000 MCG tablet Take 1,000 mcg by mouth daily.    . fluticasone (FLONASE) 50 MCG/ACT nasal spray INHALE 2 SPRAYS IN EACH NOSTRIL EVERY DAY FOR RHINITIS 16 g 1  . ibuprofen (ADVIL,MOTRIN) 600 MG tablet 1 po qd as needed 1 tablet 0  . MAGNESIUM PO Take by mouth.    . Multiple Vitamin (MULTI-VITAMIN PO) Take by mouth. Half tablet daily    . SYNTHROID 100 MCG tablet TAKE 1 TABLET BY MOUTH EVERY DAY 90 tablet 2  . triamterene-hydrochlorothiazide (MAXZIDE-25) 37.5-25 MG per tablet TAKE 1 TABLET BY MOUTH EVERY DAY 90 tablet 2   No current facility-administered medications on file prior to visit.    Allergies  Allergen Reactions  . Lisinopril     REACTION: COUGH  . Lyrica [Pregabalin] Other (See Comments)    Cns side efffects  . Pravastatin Other (See Comments)    Cramps.  . Simvastatin     Myalgia and stiffness  . Codeine Phosphate     REACTION: itching    . Oxycodone Hcl     REACTION: rash    Family History  Problem Relation Age of Onset  . Alzheimer's disease Mother   . Heart attack Father   . Asthma Sister   . Colon cancer Sister   . Heart attack Brother   . Colon polyps Sister   . Colon cancer Sister   . Colon cancer Paternal Aunt   . Stomach cancer Neg Hx   . Thyroid disease Sister     multinodular goiter    BP 122/84 mmHg  Pulse 58  Temp(Src) 98.4 F (36.9 C) (Oral)  Ht 5' 5.5" (1.664 m)  Wt 212 lb 4 oz (96.276 kg)  BMI 34.77 kg/m2  SpO2 97%    Review of Systems Denies weight change, hoarseness, visual loss, chest pain, sob, dysphagia, skin rash, easy bruising, depression, headache, numbness, and rhinorrhea.  She has a slight cough and cold intolerance.      Objective:   Physical Exam VS: see vs page GEN: no distress HEAD: head: no deformity eyes: no periorbital swelling, no proptosis external nose and ears are normal mouth: no lesion seen NECK: a healed scar is present.  The left nodule is easily noted, and is freely mobile CHEST WALL: no deformity LUNGS:  Clear to auscultation.  CV: reg rate and rhythm, no murmur ABD: abdomen is soft, nontender.  no hepatosplenomegaly.  not distended.  no hernia MUSCULOSKELETAL: muscle bulk and strength are grossly normal.  no obvious joint swelling.  gait is normal and steady EXTEMITIES: no deformity.  no edema PULSES: no carotid bruit NEURO:  cn 2-12 grossly intact.   readily moves all 4's.  sensation is intact to touch on all 4's.   SKIN:  Normal texture and temperature.  No rash or suspicious lesion is visible.   NODES:  None palpable at the neck PSYCH: alert, well-oriented.  Does not appear anxious nor depressed.   Radiol: thyroid US (06/01/15): right lobectomy.  Left lobe: minimal change compared to 01/26/2007 Lab Results  Component Value Date   TSH 0.62 01/25/2015  Assessment & Plan:  Given the minimal change and h/o benign path from right lobe, bx is  not indicated.   Hypothyroidism: well-replaced Cough: not thyroid-related.  Pt is advised to continue to work with Drs Regis Bill and Wilburn Cornelia on sxs.   Patient is advised the following: Patient Instructions  It is extremely unlikely that the thyroid nodules are harmful to you. Please continue the same levothyroxine. You should have the ultrasound rechecked in 2 years.

## 2015-06-15 DIAGNOSIS — E890 Postprocedural hypothyroidism: Secondary | ICD-10-CM | POA: Insufficient documentation

## 2015-06-15 DIAGNOSIS — E042 Nontoxic multinodular goiter: Secondary | ICD-10-CM | POA: Insufficient documentation

## 2015-06-24 ENCOUNTER — Other Ambulatory Visit: Payer: Self-pay | Admitting: Internal Medicine

## 2015-06-27 NOTE — Telephone Encounter (Signed)
Sent to the pharmacy by e-scribe. 

## 2015-06-28 DIAGNOSIS — Z4789 Encounter for other orthopedic aftercare: Secondary | ICD-10-CM | POA: Diagnosis not present

## 2015-07-11 DIAGNOSIS — Z4789 Encounter for other orthopedic aftercare: Secondary | ICD-10-CM | POA: Diagnosis not present

## 2015-07-11 DIAGNOSIS — M75111 Incomplete rotator cuff tear or rupture of right shoulder, not specified as traumatic: Secondary | ICD-10-CM | POA: Diagnosis not present

## 2015-07-14 DIAGNOSIS — M75111 Incomplete rotator cuff tear or rupture of right shoulder, not specified as traumatic: Secondary | ICD-10-CM | POA: Diagnosis not present

## 2015-08-09 DIAGNOSIS — H33193 Other retinoschisis and retinal cysts, bilateral: Secondary | ICD-10-CM | POA: Diagnosis not present

## 2015-08-09 DIAGNOSIS — H35373 Puckering of macula, bilateral: Secondary | ICD-10-CM | POA: Diagnosis not present

## 2015-08-09 DIAGNOSIS — H43313 Vitreous membranes and strands, bilateral: Secondary | ICD-10-CM | POA: Diagnosis not present

## 2015-08-09 DIAGNOSIS — Z961 Presence of intraocular lens: Secondary | ICD-10-CM | POA: Diagnosis not present

## 2015-09-14 DIAGNOSIS — Z789 Other specified health status: Secondary | ICD-10-CM | POA: Diagnosis not present

## 2015-09-14 DIAGNOSIS — Z6834 Body mass index (BMI) 34.0-34.9, adult: Secondary | ICD-10-CM | POA: Diagnosis not present

## 2015-09-14 DIAGNOSIS — B009 Herpesviral infection, unspecified: Secondary | ICD-10-CM | POA: Diagnosis not present

## 2015-09-14 DIAGNOSIS — Z01419 Encounter for gynecological examination (general) (routine) without abnormal findings: Secondary | ICD-10-CM | POA: Diagnosis not present

## 2015-09-14 DIAGNOSIS — C189 Malignant neoplasm of colon, unspecified: Secondary | ICD-10-CM | POA: Diagnosis not present

## 2015-09-20 DIAGNOSIS — M859 Disorder of bone density and structure, unspecified: Secondary | ICD-10-CM | POA: Diagnosis not present

## 2015-12-29 ENCOUNTER — Telehealth: Payer: Self-pay | Admitting: Family Medicine

## 2015-12-29 NOTE — Telephone Encounter (Signed)
Pt called and left a message on my machine.  She wanted to know when she had her last wellness exam.  Last exam was June 2016.  Pt due next month.  Please help her to make appt.  Thanks!

## 2015-12-29 NOTE — Telephone Encounter (Signed)
Pt has been sch

## 2016-02-05 ENCOUNTER — Telehealth: Payer: Self-pay | Admitting: Family Medicine

## 2016-02-05 NOTE — Telephone Encounter (Signed)
Ok to  Do as requested .( Ask her if seh wants to do Wellness with  Manuela Schwartz and  prevetive ex am and med evaluation  with me )

## 2016-02-05 NOTE — Telephone Encounter (Signed)
Please reschedule appt

## 2016-02-05 NOTE — Telephone Encounter (Signed)
Pt would like to know if she can have wellness/physical the week of July 17-21st.  Will forward to [facility] for approval.

## 2016-02-06 NOTE — Telephone Encounter (Signed)
Pt has been re-sch

## 2016-02-29 NOTE — Progress Notes (Signed)
Pre visit review using our clinic review tool, if applicable. No additional management support is needed unless otherwise documented below in the visit note.  Chief Complaint  Patient presents with  . Medicare Wellness    HPI: [name] [name] 75 y.o. comes in today for Preventive Medicare wellness visit  And Chronic disease management .Since last visit. Doing ok down at times   No more than 2-3 days at a time ok today . Thyroid  See note  Follow [place] in 2018 2 years  If all ok . HT no problem meds no se   Health Maintenance  Topic Date Due  . INFLUENZA VACCINE  03/12/2016  . MAMMOGRAM  04/17/2017  . COLONOSCOPY  09/15/2017  . TETANUS/TDAP  06/23/2022  . DEXA SCAN  Completed  . ZOSTAVAX  Completed  . PNA vac Low Risk Adult  Completed   Health Maintenance Review LIFESTYLE:  TAD no  Ex tobacco 62-85 Sugar beverages:n Sleep: mawakens some   Falls asleep and gets up sometimes in chair  With tv on    MEDICARE DOCUMENT QUESTIONS  TO SCAN   Hearing: ok   Vision:  No limitations at present . Last eye check UTD hecker   Safety:  Has smoke detector and wears seat belts.  No firearms. No excess sun exposure. Sees dentist regularly.  Falls: n  Advance directive :  Reviewed  Still has form just not completed   Memory: Felt to be good  , no concern from her or her family.  Depression: No anhedonia unusual crying or depressive symptoms  Nutrition: Eats well balanced diet; adequate calcium and vitamin D. No swallowing chewing problems.  Injury: no major injuries in the last six months.  Other healthcare providers:  Reviewed today .  Social:  hh of 1 . No pets.   Preventive parameters: up-to-date  Reviewed   ADLS:   There are no problems or need for assistance  driving, feeding, obtaining food, dressing, toileting and bathing, managing money using phone. She is independent.   ROS:  GEN/ HEENT: No fever, significant weight changes sweats headaches vision problems hearing  changes, CV/ PULM; No chest pain shortness of breath cough, syncope,edema  change in exercise tolerance. GI /GU: No adominal pain, vomiting, change in bowel habits. No blood in the stool. No significant GU symptoms. SKIN/HEME: ,no acute skin rashes suspicious lesions or bleeding. No lymphadenopathy, nodules, masses.  NEURO/ PSYCH:  No neurologic signs such as weakness numbness. No depression anxiety. IMM/ Allergy: No unusual infections.  Allergy .   REST of 12 system review negative except as per HPI   Past Medical History  Diagnosis Date  . GERD (gastroesophageal reflux disease)   . Hypertension   . Hyperlipidemia   . Thyroid disease   . Pulsatile tinnitus     had MRA and MPV nl mild carotid dopplers2010  . Hyperglycemia   . Headache(784.0)   . Cough     ent evaluation  . Abdominal pain      Neg ct [place] and mri of back   . DJD (degenerative joint disease) of lumbar spine   . Adenomatous colon polyp   . Allergy   . Anxiety   . History of positive PPD 10/16/2011  . Estrogen deficiency   . IBS (irritable bowel syndrome)   . Anemia   . Vulvitis   . HSV-2 infection   . Gastritis   . Colon polyp   . Shingles 02/09/2011    Right upper face  and eyelid but not involving the eye at this point.   . Leg cramps 11/05/2013    Appears to have been from the higher dose of diuretic better on low dose and magnesium.   . Sleep apnea     does not use c pap  . Helicobacter pylori infection 10/13/2007    Qualifier: Diagnosis of  By: Marland Mcalpine      Family History  Problem Relation Age of Onset  . Alzheimer's disease Mother   . Heart attack Father   . Asthma Sister   . Colon cancer Sister   . Heart attack Brother   . Colon polyps Sister   . Colon cancer Sister   . Colon cancer Paternal Aunt   . Stomach cancer Neg Hx   . Thyroid disease Sister     multinodular goiter    Social History   Social History  . Marital Status: Single    Spouse Name: widowed.   . Number of  Children: Y  . Years of Education: N/A   Occupational History  . retired.     [employer]----now retired.  .     Social History Main Topics  . Smoking status: Former Smoker -- 0.50 packs/day for 23 years    Types: Cigarettes    Quit date: 06/16/1984  . Smokeless tobacco: Never Used  . Alcohol Use: No  . Drug Use: No  . Sexual Activity:    Partners: Male    Birth Control/ Protection: Surgical   Other Topics Concern  . Not on file   Social History Narrative   Retired    former smoker    widowed fall 2010 from myeloma. Lives alone.    [place] [address]  No pets   Taking care of  8 yo gc and now newborn in the fall     takign time off of child care     Outpatient Encounter Prescriptions as of 03/01/2016  Medication Sig  . acyclovir (ZOVIRAX) 400 MG tablet Take PO BID PRN  . Cholecalciferol (VITAMIN D) 1000 UNITS capsule Take 1,000 Units by mouth daily as needed.   . fluticasone (FLONASE) 50 MCG/ACT nasal spray INHALE 2 SPRAYS IN EACH NOSTRIL EVERY DAY FOR RHINITIS  . ibuprofen (ADVIL,MOTRIN) 600 MG tablet 1 po qd as needed  . MAGNESIUM PO Take by mouth.  . SYNTHROID 100 MCG tablet TAKE 1 TABLET BY MOUTH EVERY DAY  . triamterene-hydrochlorothiazide (MAXZIDE-25) 37.5-25 MG tablet TAKE 1 TABLET BY MOUTH EVERY DAY  . cyanocobalamin 1000 MCG tablet Take 1,000 mcg by mouth daily. Reported on 03/01/2016  . [DISCONTINUED] Multiple Vitamin (MULTI-VITAMIN PO) Take by mouth. Half tablet daily   No facility-administered encounter medications on file as of 03/01/2016.    EXAM:  BP 132/66 mmHg  Temp(Src) 98.7 F (37.1 C) (Oral)  Ht 5' 5.5" (1.664 m)  Wt 198 lb 6.4 oz (89.994 kg)  BMI 32.50 kg/m2  Body mass index is 32.5 kg/(m^2).  Physical Exam: Vital signs reviewed HDQ:QIWL is a well-developed well-nourished alert cooperative   who appears stated age in no acute distress.  HEENT: normocephalic atraumatic , Eyes: PERRL EOM's full, conjunctiva clear, Nares: paten,t no deformity discharge or  tenderness., Ears: no deformity EAC's clear TMs with normal landmarks. Mouth: clear OP, no lesions, edema.  Moist mucous membranes. Dentition in adequate repair. NECK: supple without masses,  X thyroid  no or bruits. CHEST/PULM:  Clear to auscultation and percussion breath sounds equal no wheeze , rales or rhonchi. No  chest wall deformities or tenderness.Breast: normal by inspection . No dimpling, discharge, masses, tenderness or discharge . CV: PMI is nondisplaced, S1 S2 no gallops, murmurs, rubs. Peripheral pulses are full without delay.No JVD .  ABDOMEN: Bowel sounds normal nontender  No guard or rebound, no hepato splenomegal no CVA tenderness.   Extremtities:  No clubbing cyanosis or edema, no acute joint swelling or redness no focal atrophy NEURO:  Oriented x3, cranial nerves 3-12 appear to be intact, no obvious focal weakness,gait within normal limits no abnormal reflexes or asymmetrical SKIN: No acute rashes normal turgor, color, no bruising or petechiae. PSYCH: Oriented, good eye contact, no obvious depression anxiety, cognition and judgment appear normal. LN: no cervical axillary inguinal adenopathy No noted deficits in memory, attention, and speech.   Lab Results  Component Value Date   WBC 5.4 01/25/2015   HGB 12.4 01/25/2015   HCT 38.1 01/25/2015   PLT 211.0 01/25/2015   GLUCOSE 90 03/01/2016   CHOL 214* 03/01/2016   TRIG 121.0 03/01/2016   HDL 56.50 03/01/2016   LDLDIRECT 131.1 08/09/2009   LDLCALC 134* 03/01/2016   ALT 14 03/01/2016   AST 25 03/01/2016   NA 141 03/01/2016   K 4.4 03/01/2016   CL 103 03/01/2016   CREATININE 0.84 03/01/2016   BUN 21 03/01/2016   CO2 31 03/01/2016   TSH 0.79 03/01/2016   INR 0.9 04/14/2007   HGBA1C 5.9 03/01/2016    ASSESSMENT AND PLAN:  Discussed the following assessment and plan:  Medicare annual wellness visit, subsequent  Hypothyroidism, unspecified hypothyroidism type - Plan: Basic metabolic panel, Hepatic function panel,  Hemoglobin A1c, Lipid panel, TSH  Hyperglycemia - Plan: Basic metabolic panel, Hepatic function panel, Hemoglobin A1c, Lipid panel, TSH  Essential hypertension - Plan: Basic metabolic panel, Hepatic function panel, Hemoglobin A1c, Lipid panel, TSH  Hyperlipidemia - Plan: Basic metabolic panel, Hepatic function panel, Hemoglobin A1c, Lipid panel, TSH  Multinodular goiter  Sleep disturbance - dsic sleep hygien does better if exercises etc   Patient Care Team: Burnis Medin, MD as PCP - General Jerrell Belfast, MD Eldred Manges, MD (Obstetrics and Gynecology) Gatha Mayer, MD (Gastroenterology) Justice Britain, MD as Consulting Physician (Orthopedic Surgery) Monna Fam, MD as Consulting Physician (Ophthalmology)  Patient Instructions  Will notify you  of labs when available.  Due for  Repeat ultrasound  thryoid  Fall of 2018   Consider  Your advance directive.   Monitor sleep pattern  ligiht in day  Dark at night   Avoid back lighting .   In the eveneing  Can keep you up.  Health Maintenance, Female Adopting a healthy lifestyle and getting preventive care can go a long way to promote health and wellness. Talk with your health care provider about what schedule of regular examinations is right for you. This is a good chance for you to check in with your provider about disease prevention and staying healthy. In between checkups, there are plenty of things you can do on your own. Experts have done a lot of research about which lifestyle changes and preventive measures are most likely to keep you healthy. Ask your health care provider for more information. WEIGHT AND DIET  Eat a healthy diet  Be sure to include plenty of vegetables, fruits, low-fat dairy products, and lean protein.  Do not eat a lot of foods high in solid fats, added sugars, or salt.  Get regular exercise. This is one of the most important things you can  do for your health.  Most adults should exercise for at  least 150 minutes each week. The exercise should increase your heart rate and make you sweat (moderate-intensity exercise).  Most adults should also do strengthening exercises at least twice a week. This is in addition to the moderate-intensity exercise.  Maintain a healthy weight  Body mass index (BMI) is a measurement that can be used to identify possible weight problems. It estimates body fat based on height and weight. Your health care provider can help determine your BMI and help you achieve or maintain a healthy weight.  For females 56 years of age and older:   A BMI below 18.5 is considered underweight.  A BMI of 18.5 to 24.9 is normal.  A BMI of 25 to 29.9 is considered overweight.  A BMI of 30 and above is considered obese.  Watch levels of cholesterol and blood lipids  You should start having your blood tested for lipids and cholesterol at 74 years of age, then have this test every 5 years.  You may need to have your cholesterol levels checked more often if:  Your lipid or cholesterol levels are high.  You are older than 74 years of age.  You are at high risk for heart disease.  CANCER SCREENING   Lung Cancer  Lung cancer screening is recommended for adults 73-48 years old who are at high risk for lung cancer because of a history of smoking.  A yearly low-dose CT scan of the lungs is recommended for people who:  Currently smoke.  Have quit within the past 15 years.  Have at least a 30-pack-year history of smoking. A pack year is smoking an average of one pack of cigarettes a day for 1 year.  Yearly screening should continue until it has been 15 years since you quit.  Yearly screening should stop if you develop a health problem that would prevent you from having lung cancer treatment.  Breast Cancer  Practice breast self-awareness. This means understanding how your breasts normally appear and feel.  It also means doing regular breast self-exams. Let your  health care provider know about any changes, no matter how small.  If you are in your 20s or 30s, you should have a clinical breast exam (CBE) by a health care provider every 1-3 years as part of a regular health exam.  If you are 44 or older, have a CBE every year. Also consider having a breast X-ray (mammogram) every year.  If you have a family history of breast cancer, talk to your health care provider about genetic screening.  If you are at high risk for breast cancer, talk to your health care provider about having an MRI and a mammogram every year.  Breast cancer gene (BRCA) assessment is recommended for women who have family members with BRCA-related cancers. BRCA-related cancers include:  Breast.  Ovarian.  Tubal.  Peritoneal cancers.  Results of the assessment will determine the need for genetic counseling and BRCA1 and BRCA2 testing. Cervical Cancer Your health care provider may recommend that you be screened regularly for cancer of the pelvic organs (ovaries, uterus, and vagina). This screening involves a pelvic examination, including checking for microscopic changes to the surface of your cervix (Pap test). You may be encouraged to have this screening done every 3 years, beginning at age 77.  For women ages 7-65, health care providers may recommend pelvic exams and Pap testing every 3 years, or they may recommend the Pap and  pelvic exam, combined with testing for human papilloma virus (HPV), every 5 years. Some types of HPV increase your risk of cervical cancer. Testing for HPV may also be done on women of any age with unclear Pap test results.  Other health care providers may not recommend any screening for nonpregnant women who are considered low risk for pelvic cancer and who do not have symptoms. Ask your health care provider if a screening pelvic exam is right for you.  If you have had past treatment for cervical cancer or a condition that could lead to cancer, you need  Pap tests and screening for cancer for at least 20 years after your treatment. If Pap tests have been discontinued, your risk factors (such as having a new sexual partner) need to be reassessed to determine if screening should resume. Some women have medical problems that increase the chance of getting cervical cancer. In these cases, your health care provider may recommend more frequent screening and Pap tests. Colorectal Cancer  This type of cancer can be detected and often prevented.  Routine colorectal cancer screening usually begins at 74 years of age and continues through 74 years of age.  Your health care provider may recommend screening at an earlier age if you have risk factors for colon cancer.  Your health care provider may also recommend using home test kits to check for hidden blood in the stool.  A small camera at the end of a tube can be used to examine your colon directly (sigmoidoscopy or colonoscopy). This is done to check for the earliest forms of colorectal cancer.  Routine screening usually begins at age 65.  Direct examination of the colon should be repeated every 5-10 years through 74 years of age. However, you may need to be screened more often if early forms of precancerous polyps or small growths are found. Skin Cancer  Check your skin from head to toe regularly.  Tell your health care provider about any new moles or changes in moles, especially if there is a change in a mole's shape or color.  Also tell your health care provider if you have a mole that is larger than the size of a pencil eraser.  Always use sunscreen. Apply sunscreen liberally and repeatedly throughout the day.  Protect yourself by wearing long sleeves, pants, a wide-brimmed hat, and sunglasses whenever you are outside. HEART DISEASE, DIABETES, AND HIGH BLOOD PRESSURE   High blood pressure causes heart disease and increases the risk of stroke. High blood pressure is more likely to develop  in:  People who have blood pressure in the high end of the normal range (130-139/85-89 mm Hg).  People who are overweight or obese.  People who are African American.  If you are 29-103 years of age, have your blood pressure checked every 3-5 years. If you are 71 years of age or older, have your blood pressure checked every year. You should have your blood pressure measured twice--once when you are at a hospital or clinic, and once when you are not at a hospital or clinic. Record the average of the two measurements. To check your blood pressure when you are not at a hospital or clinic, you can use:  An automated blood pressure machine at a pharmacy.  A home blood pressure monitor.  If you are between 62 years and 27 years old, ask your health care provider if you should take aspirin to prevent strokes.  Have regular diabetes screenings. This involves taking a  blood sample to check your fasting blood sugar level.  If you are at a normal weight and have a low risk for diabetes, have this test once every three years after 74 years of age.  If you are overweight and have a high risk for diabetes, consider being tested at a younger age or more often. PREVENTING INFECTION  Hepatitis B  If you have a higher risk for hepatitis B, you should be screened for this virus. You are considered at high risk for hepatitis B if:  You were born in a country where hepatitis B is common. Ask your health care provider which countries are considered high risk.  Your parents were born in a high-risk country, and you have not been immunized against hepatitis B (hepatitis B vaccine).  You have HIV or AIDS.  You use needles to inject street drugs.  You live with someone who has hepatitis B.  You have had sex with someone who has hepatitis B.  You get hemodialysis treatment.  You take certain medicines for conditions, including cancer, organ transplantation, and autoimmune conditions. Hepatitis C  Blood  testing is recommended for:  Everyone born from [birth year] through 1964.  Anyone with known risk factors for hepatitis C. Sexually transmitted infections (STIs)  You should be screened for sexually transmitted infections (STIs) including gonorrhea and chlamydia if:  You are sexually active and are younger than 74 years of age.  You are older than 74 years of age and your health care provider tells you that you are at risk for this type of infection.  Your sexual activity has changed since you were last screened and you are at an increased risk for chlamydia or gonorrhea. Ask your health care provider if you are at risk.  If you do not have HIV, but are at risk, it may be recommended that you take a prescription medicine daily to prevent HIV infection. This is called pre-exposure prophylaxis (PrEP). You are considered at risk if:  You are sexually active and do not regularly use condoms or know the HIV status of your partner(s).  You take drugs by injection.  You are sexually active with a partner who has HIV. Talk with your health care provider about whether you are at high risk of being infected with HIV. If you choose to begin PrEP, you should first be tested for HIV. You should then be tested every 3 months for as long as you are taking PrEP.  PREGNANCY   If you are premenopausal and you may become pregnant, ask your health care provider about preconception counseling.  If you may become pregnant, take 400 to 800 micrograms (mcg) of folic acid every day.  If you want to prevent pregnancy, talk to your health care provider about birth control (contraception). OSTEOPOROSIS AND MENOPAUSE   Osteoporosis is a disease in which the bones lose minerals and strength with aging. This can result in serious bone fractures. Your risk for osteoporosis can be identified using a bone density scan.  If you are 14 years of age or older, or if you are at risk for osteoporosis and fractures, ask your  health care provider if you should be screened.  Ask your health care provider whether you should take a calcium or vitamin D supplement to lower your risk for osteoporosis.  Menopause may have certain physical symptoms and risks.  Hormone replacement therapy may reduce some of these symptoms and risks. Talk to your health care provider about whether hormone replacement  therapy is right for you.  HOME CARE INSTRUCTIONS   Schedule regular health, dental, and eye exams.  Stay current with your immunizations.   Do not use any tobacco products including cigarettes, chewing tobacco, or electronic cigarettes.  If you are pregnant, do not drink alcohol.  If you are breastfeeding, limit how much and how often you drink alcohol.  Limit alcohol intake to no more than 1 drink per day for nonpregnant women. One drink equals 12 ounces of beer, 5 ounces of wine, or 1 ounces of hard liquor.  Do not use street drugs.  Do not share needles.  Ask your health care provider for help if you need support or information about quitting drugs.  Tell your health care provider if you often feel depressed.  Tell your health care provider if you have ever been abused or do not feel safe at home.   This information is not intended to replace advice given to you by your health care provider. Make sure you discuss any questions you have with your health care provider.   Document Released: 02/11/2011 Document Revised: 08/19/2014 Document Reviewed: 06/30/2013 Elsevier Interactive Patient Education 2016 [employer]     Insomnia Insomnia is a sleep disorder that makes it difficult to fall asleep or to stay asleep. Insomnia can cause tiredness (fatigue), low energy, difficulty concentrating, mood swings, and poor performance at work or school.  There are three different ways to classify insomnia:  Difficulty falling asleep.  Difficulty staying asleep.  Waking up too early in the morning. Any type of  insomnia can be long-term (chronic) or short-term (acute). Both are common. Short-term insomnia usually lasts for three months or less. Chronic insomnia occurs at least three times a week for longer than three months. CAUSES  Insomnia may be caused by another condition, situation, or substance, such as:  Anxiety.  Certain medicines.  Gastroesophageal reflux disease (GERD) or other gastrointestinal conditions.  Asthma or other breathing conditions.  Restless legs syndrome, sleep apnea, or other sleep disorders.  Chronic pain.  Menopause. This may include hot flashes.  Stroke.  Abuse of alcohol, tobacco, or illegal drugs.  Depression.  Caffeine.   Neurological disorders, such as Alzheimer disease.  An overactive thyroid (hyperthyroidism). The cause of insomnia may not be known. RISK FACTORS Risk factors for insomnia include:  Gender. Women are more commonly affected than men.  Age. Insomnia is more common as you get older.  Stress. This may involve your professional or personal life.  Income. Insomnia is more common in people with lower income.  Lack of exercise.   Irregular work schedule or night shifts.  Traveling between different time zones. SIGNS AND SYMPTOMS If you have insomnia, trouble falling asleep or trouble staying asleep is the main symptom. This may lead to other symptoms, such as:  Feeling fatigued.  Feeling nervous about going to sleep.  Not feeling rested in the morning.  Having trouble concentrating.  Feeling irritable, anxious, or depressed. TREATMENT  Treatment for insomnia depends on the cause. If your insomnia is caused by an underlying condition, treatment will focus on addressing the condition. Treatment may also include:   Medicines to help you sleep.  Counseling or therapy.  Lifestyle adjustments. HOME CARE INSTRUCTIONS   Take medicines only as directed by your health care provider.  Keep regular sleeping and waking  hours. Avoid naps.  Keep a sleep diary to help you and your health care provider figure out what could be causing your insomnia. Include:  When you sleep.  When you wake up during the night.  How well you sleep.   How rested you feel the next day.  Any side effects of medicines you are taking.  What you eat and drink.   Make your bedroom a comfortable place where it is easy to fall asleep:  Put up shades or special blackout curtains to block light from outside.  Use a white noise machine to block noise.  Keep the temperature cool.   Exercise regularly as directed by your health care provider. Avoid exercising right before bedtime.  Use relaxation techniques to manage stress. Ask your health care provider to suggest some techniques that may work well for you. These may include:  Breathing exercises.  Routines to release muscle tension.  Visualizing peaceful scenes.  Cut back on alcohol, caffeinated beverages, and cigarettes, especially close to bedtime. These can disrupt your sleep.  Do not overeat or eat spicy foods right before bedtime. This can lead to digestive discomfort that can make it hard for you to sleep.  Limit screen use before bedtime. This includes:  Watching TV.  Using your smartphone, tablet, and computer.  Stick to a routine. This can help you fall asleep faster. Try to do a quiet activity, brush your teeth, and go to bed at the same time each night.  Get out of bed if you are still awake after 15 minutes of trying to sleep. Keep the lights down, but try reading or doing a quiet activity. When you feel sleepy, go back to bed.  Make sure that you drive carefully. Avoid driving if you feel very sleepy.  Keep all follow-up appointments as directed by your health care provider. This is important. SEEK MEDICAL CARE IF:   You are tired throughout the day or have trouble in your daily routine due to sleepiness.  You continue to have sleep problems  or your sleep problems get worse. SEEK IMMEDIATE MEDICAL CARE IF:   You have serious thoughts about hurting yourself or someone else.   This information is not intended to replace advice given to you by your health care provider. Make sure you discuss any questions you have with your health care provider.   Document Released: 07/26/2000 Document Revised: 04/19/2015 Document Reviewed: 04/29/2014 Elsevier Interactive Patient Education 2016 [place]. Yanique Mulvihill M.D.

## 2016-03-01 ENCOUNTER — Encounter: Payer: Self-pay | Admitting: Internal Medicine

## 2016-03-01 ENCOUNTER — Ambulatory Visit (INDEPENDENT_AMBULATORY_CARE_PROVIDER_SITE_OTHER): Payer: Medicare Other | Admitting: Internal Medicine

## 2016-03-01 VITALS — BP 132/66 | Temp 98.7°F | Ht 65.5 in | Wt 198.4 lb

## 2016-03-01 DIAGNOSIS — R739 Hyperglycemia, unspecified: Secondary | ICD-10-CM | POA: Diagnosis not present

## 2016-03-01 DIAGNOSIS — Z0000 Encounter for general adult medical examination without abnormal findings: Secondary | ICD-10-CM

## 2016-03-01 DIAGNOSIS — G479 Sleep disorder, unspecified: Secondary | ICD-10-CM

## 2016-03-01 DIAGNOSIS — E042 Nontoxic multinodular goiter: Secondary | ICD-10-CM

## 2016-03-01 DIAGNOSIS — I10 Essential (primary) hypertension: Secondary | ICD-10-CM

## 2016-03-01 DIAGNOSIS — E039 Hypothyroidism, unspecified: Secondary | ICD-10-CM

## 2016-03-01 DIAGNOSIS — E785 Hyperlipidemia, unspecified: Secondary | ICD-10-CM

## 2016-03-01 LAB — BASIC METABOLIC PANEL
BUN: 21 mg/dL (ref 6–23)
CO2: 31 mEq/L (ref 19–32)
Calcium: 10 mg/dL (ref 8.4–10.5)
Chloride: 103 mEq/L (ref 96–112)
Creatinine, Ser: 0.84 mg/dL (ref 0.40–1.20)
GFR: 85.2 mL/min (ref >60.00)
Glucose, Bld: 90 mg/dL (ref 70–99)
Potassium: 4.4 mEq/L (ref 3.5–5.1)
Sodium: 141 mEq/L (ref 135–145)

## 2016-03-01 LAB — HEPATIC FUNCTION PANEL
ALT: 14 U/L (ref 0–35)
AST: 25 U/L (ref 0–37)
Albumin: 4.3 g/dL (ref 3.5–5.2)
Alkaline Phosphatase: 60 U/L (ref 39–117)
Bilirubin, Direct: 0.1 mg/dL (ref 0.0–0.3)
Total Bilirubin: 0.6 mg/dL (ref 0.2–1.2)
Total Protein: 7.3 g/dL (ref 6.0–8.3)

## 2016-03-01 LAB — TSH: TSH: 0.79 u[IU]/mL (ref 0.35–4.50)

## 2016-03-01 LAB — LIPID PANEL
Cholesterol: 214 mg/dL — ABNORMAL HIGH (ref 0–200)
HDL: 56.5 mg/dL (ref >39.00)
LDL Cholesterol: 134 mg/dL — ABNORMAL HIGH (ref 0–99)
NonHDL: 157.75
Total CHOL/HDL Ratio: 4
Triglycerides: 121 mg/dL (ref 0.0–149.0)
VLDL: 24.2 mg/dL (ref 0.0–40.0)

## 2016-03-01 LAB — HEMOGLOBIN A1C: Hgb A1c MFr Bld: 5.9 % (ref 4.6–6.5)

## 2016-03-01 NOTE — Patient Instructions (Addendum)
Will notify you  of labs when available.  Due for  Repeat ultrasound  thryoid  Fall of 2018   Consider  Your advance directive.   Monitor sleep pattern  ligiht in day  Dark at night   Avoid back lighting .   In the eveneing  Can keep you up.  Health Maintenance, Female Adopting a healthy lifestyle and getting preventive care can go a long way to promote health and wellness. Talk with your health care provider about what schedule of regular examinations is right for you. This is a good chance for you to check in with your provider about disease prevention and staying healthy. In between checkups, there are plenty of things you can do on your own. Experts have done a lot of research about which lifestyle changes and preventive measures are most likely to keep you healthy. Ask your health care provider for more information. WEIGHT AND DIET  Eat a healthy diet  Be sure to include plenty of vegetables, fruits, low-fat dairy products, and lean protein.  Do not eat a lot of foods high in solid fats, added sugars, or salt.  Get regular exercise. This is one of the most important things you can do for your health.  Most adults should exercise for at least 150 minutes each week. The exercise should increase your heart rate and make you sweat (moderate-intensity exercise).  Most adults should also do strengthening exercises at least twice a week. This is in addition to the moderate-intensity exercise.  Maintain a healthy weight  Body mass index (BMI) is a measurement that can be used to identify possible weight problems. It estimates body fat based on height and weight. Your health care provider can help determine your BMI and help you achieve or maintain a healthy weight.  For females 29 years of age and older:   A BMI below 18.5 is considered underweight.  A BMI of 18.5 to 24.9 is normal.  A BMI of 25 to 29.9 is considered overweight.  A BMI of 30 and above is considered obese.  Watch  levels of cholesterol and blood lipids  You should start having your blood tested for lipids and cholesterol at 74 years of age, then have this test every 5 years.  You may need to have your cholesterol levels checked more often if:  Your lipid or cholesterol levels are high.  You are older than 74 years of age.  You are at high risk for heart disease.  CANCER SCREENING   Lung Cancer  Lung cancer screening is recommended for adults 19-61 years old who are at high risk for lung cancer because of a history of smoking.  A yearly low-dose CT scan of the lungs is recommended for people who:  Currently smoke.  Have quit within the past 15 years.  Have at least a 30-pack-year history of smoking. A pack year is smoking an average of one pack of cigarettes a day for 1 year.  Yearly screening should continue until it has been 15 years since you quit.  Yearly screening should stop if you develop a health problem that would prevent you from having lung cancer treatment.  Breast Cancer  Practice breast self-awareness. This means understanding how your breasts normally appear and feel.  It also means doing regular breast self-exams. Let your health care provider know about any changes, no matter how small.  If you are in your 20s or 30s, you should have a clinical breast exam (CBE) by  a health care provider every 1-3 years as part of a regular health exam.  If you are 29 or older, have a CBE every year. Also consider having a breast X-ray (mammogram) every year.  If you have a family history of breast cancer, talk to your health care provider about genetic screening.  If you are at high risk for breast cancer, talk to your health care provider about having an MRI and a mammogram every year.  Breast cancer gene (BRCA) assessment is recommended for women who have family members with BRCA-related cancers. BRCA-related cancers include:  Breast.  Ovarian.  Tubal.  Peritoneal  cancers.  Results of the assessment will determine the need for genetic counseling and BRCA1 and BRCA2 testing. Cervical Cancer Your health care provider may recommend that you be screened regularly for cancer of the pelvic organs (ovaries, uterus, and vagina). This screening involves a pelvic examination, including checking for microscopic changes to the surface of your cervix (Pap test). You may be encouraged to have this screening done every 3 years, beginning at age 67.  For women ages 35-65, health care providers may recommend pelvic exams and Pap testing every 3 years, or they may recommend the Pap and pelvic exam, combined with testing for human papilloma virus (HPV), every 5 years. Some types of HPV increase your risk of cervical cancer. Testing for HPV may also be done on women of any age with unclear Pap test results.  Other health care providers may not recommend any screening for nonpregnant women who are considered low risk for pelvic cancer and who do not have symptoms. Ask your health care provider if a screening pelvic exam is right for you.  If you have had past treatment for cervical cancer or a condition that could lead to cancer, you need Pap tests and screening for cancer for at least 20 years after your treatment. If Pap tests have been discontinued, your risk factors (such as having a new sexual partner) need to be reassessed to determine if screening should resume. Some women have medical problems that increase the chance of getting cervical cancer. In these cases, your health care provider may recommend more frequent screening and Pap tests. Colorectal Cancer  This type of cancer can be detected and often prevented.  Routine colorectal cancer screening usually begins at 74 years of age and continues through 74 years of age.  Your health care provider may recommend screening at an earlier age if you have risk factors for colon cancer.  Your health care provider may also  recommend using home test kits to check for hidden blood in the stool.  A small camera at the end of a tube can be used to examine your colon directly (sigmoidoscopy or colonoscopy). This is done to check for the earliest forms of colorectal cancer.  Routine screening usually begins at age 41.  Direct examination of the colon should be repeated every 5-10 years through 74 years of age. However, you may need to be screened more often if early forms of precancerous polyps or small growths are found. Skin Cancer  Check your skin from head to toe regularly.  Tell your health care provider about any new moles or changes in moles, especially if there is a change in a mole's shape or color.  Also tell your health care provider if you have a mole that is larger than the size of a pencil eraser.  Always use sunscreen. Apply sunscreen liberally and repeatedly throughout the day.  Protect yourself by wearing long sleeves, pants, a wide-brimmed hat, and sunglasses whenever you are outside. HEART DISEASE, DIABETES, AND HIGH BLOOD PRESSURE   High blood pressure causes heart disease and increases the risk of stroke. High blood pressure is more likely to develop in:  People who have blood pressure in the high end of the normal range (130-139/85-89 mm Hg).  People who are overweight or obese.  People who are African American.  If you are 75-60 years of age, have your blood pressure checked every 3-5 years. If you are 64 years of age or older, have your blood pressure checked every year. You should have your blood pressure measured twice--once when you are at a hospital or clinic, and once when you are not at a hospital or clinic. Record the average of the two measurements. To check your blood pressure when you are not at a hospital or clinic, you can use:  An automated blood pressure machine at a pharmacy.  A home blood pressure monitor.  If you are between 58 years and 72 years old, ask your health  care provider if you should take aspirin to prevent strokes.  Have regular diabetes screenings. This involves taking a blood sample to check your fasting blood sugar level.  If you are at a normal weight and have a low risk for diabetes, have this test once every three years after 74 years of age.  If you are overweight and have a high risk for diabetes, consider being tested at a younger age or more often. PREVENTING INFECTION  Hepatitis B  If you have a higher risk for hepatitis B, you should be screened for this virus. You are considered at high risk for hepatitis B if:  You were born in a country where hepatitis B is common. Ask your health care provider which countries are considered high risk.  Your parents were born in a high-risk country, and you have not been immunized against hepatitis B (hepatitis B vaccine).  You have HIV or AIDS.  You use needles to inject street drugs.  You live with someone who has hepatitis B.  You have had sex with someone who has hepatitis B.  You get hemodialysis treatment.  You take certain medicines for conditions, including cancer, organ transplantation, and autoimmune conditions. Hepatitis C  Blood testing is recommended for:  Everyone born from [birth year] through 1964.  Anyone with known risk factors for hepatitis C. Sexually transmitted infections (STIs)  You should be screened for sexually transmitted infections (STIs) including gonorrhea and chlamydia if:  You are sexually active and are younger than 74 years of age.  You are older than 74 years of age and your health care provider tells you that you are at risk for this type of infection.  Your sexual activity has changed since you were last screened and you are at an increased risk for chlamydia or gonorrhea. Ask your health care provider if you are at risk.  If you do not have HIV, but are at risk, it may be recommended that you take a prescription medicine daily to prevent HIV  infection. This is called pre-exposure prophylaxis (PrEP). You are considered at risk if:  You are sexually active and do not regularly use condoms or know the HIV status of your partner(s).  You take drugs by injection.  You are sexually active with a partner who has HIV. Talk with your health care provider about whether you are at high risk of being infected  with HIV. If you choose to begin PrEP, you should first be tested for HIV. You should then be tested every 3 months for as long as you are taking PrEP.  PREGNANCY   If you are premenopausal and you may become pregnant, ask your health care provider about preconception counseling.  If you may become pregnant, take 400 to 800 micrograms (mcg) of folic acid every day.  If you want to prevent pregnancy, talk to your health care provider about birth control (contraception). OSTEOPOROSIS AND MENOPAUSE   Osteoporosis is a disease in which the bones lose minerals and strength with aging. This can result in serious bone fractures. Your risk for osteoporosis can be identified using a bone density scan.  If you are 71 years of age or older, or if you are at risk for osteoporosis and fractures, ask your health care provider if you should be screened.  Ask your health care provider whether you should take a calcium or vitamin D supplement to lower your risk for osteoporosis.  Menopause may have certain physical symptoms and risks.  Hormone replacement therapy may reduce some of these symptoms and risks. Talk to your health care provider about whether hormone replacement therapy is right for you.  HOME CARE INSTRUCTIONS   Schedule regular health, dental, and eye exams.  Stay current with your immunizations.   Do not use any tobacco products including cigarettes, chewing tobacco, or electronic cigarettes.  If you are pregnant, do not drink alcohol.  If you are breastfeeding, limit how much and how often you drink alcohol.  Limit  alcohol intake to no more than 1 drink per day for nonpregnant women. One drink equals 12 ounces of beer, 5 ounces of wine, or 1 ounces of hard liquor.  Do not use street drugs.  Do not share needles.  Ask your health care provider for help if you need support or information about quitting drugs.  Tell your health care provider if you often feel depressed.  Tell your health care provider if you have ever been abused or do not feel safe at home.   This information is not intended to replace advice given to you by your health care provider. Make sure you discuss any questions you have with your health care provider.   Document Released: 02/11/2011 Document Revised: 08/19/2014 Document Reviewed: 06/30/2013 Elsevier Interactive Patient Education 2016 Else[employer] Inc[employer]     Insomnia Insomnia is a sleep disorder that makes it difficult to fall asleep or to stay asleep. Insomnia can cause tiredness (fatigue), low energy, difficulty concentrating, mood swings, and poor performance at work or school.  There are three different ways to classify insomnia:  Difficulty falling asleep.  Difficulty staying asleep.  Waking up too early in the morning. Any type of insomnia can be long-term (chronic) or short-term (acute). Both are common. Short-term insomnia usually lasts for three months or less. Chronic insomnia occurs at least three times a week for longer than three months. CAUSES  Insomnia may be caused by another condition, situation, or substance, such as:  Anxiety.  Certain medicines.  Gastroesophageal reflux disease (GERD) or other gastrointestinal conditions.  Asthma or other breathing conditions.  Restless legs syndrome, sleep apnea, or other sleep disorders.  Chronic pain.  Menopause. This may include hot flashes.  Stroke.  Abuse of alcohol, tobacco, or illegal drugs.  Depression.  Caffeine.   Neurological disorders, such as Alzheimer disease.  An overactive thyroid  (hyperthyroidism). The cause of insomnia may not be known.  RISK FACTORS Risk factors for insomnia include:  Gender. Women are more commonly affected than men.  Age. Insomnia is more common as you get older.  Stress. This may involve your professional or personal life.  Income. Insomnia is more common in people with lower income.  Lack of exercise.   Irregular work schedule or night shifts.  Traveling between different time zones. SIGNS AND SYMPTOMS If you have insomnia, trouble falling asleep or trouble staying asleep is the main symptom. This may lead to other symptoms, such as:  Feeling fatigued.  Feeling nervous about going to sleep.  Not feeling rested in the morning.  Having trouble concentrating.  Feeling irritable, anxious, or depressed. TREATMENT  Treatment for insomnia depends on the cause. If your insomnia is caused by an underlying condition, treatment will focus on addressing the condition. Treatment may also include:   Medicines to help you sleep.  Counseling or therapy.  Lifestyle adjustments. HOME CARE INSTRUCTIONS   Take medicines only as directed by your health care provider.  Keep regular sleeping and waking hours. Avoid naps.  Keep a sleep diary to help you and your health care provider figure out what could be causing your insomnia. Include:   When you sleep.  When you wake up during the night.  How well you sleep.   How rested you feel the next day.  Any side effects of medicines you are taking.  What you eat and drink.   Make your bedroom a comfortable place where it is easy to fall asleep:  Put up shades or special blackout curtains to block light from outside.  Use a white noise machine to block noise.  Keep the temperature cool.   Exercise regularly as directed by your health care provider. Avoid exercising right before bedtime.  Use relaxation techniques to manage stress. Ask your health care provider to suggest some  techniques that may work well for you. These may include:  Breathing exercises.  Routines to release muscle tension.  Visualizing peaceful scenes.  Cut back on alcohol, caffeinated beverages, and cigarettes, especially close to bedtime. These can disrupt your sleep.  Do not overeat or eat spicy foods right before bedtime. This can lead to digestive discomfort that can make it hard for you to sleep.  Limit screen use before bedtime. This includes:  Watching TV.  Using your smartphone, tablet, and computer.  Stick to a routine. This can help you fall asleep faster. Try to do a quiet activity, brush your teeth, and go to bed at the same time each night.  Get out of bed if you are still awake after 15 minutes of trying to sleep. Keep the lights down, but try reading or doing a quiet activity. When you feel sleepy, go back to bed.  Make sure that you drive carefully. Avoid driving if you feel very sleepy.  Keep all follow-up appointments as directed by your health care provider. This is important. SEEK MEDICAL CARE IF:   You are tired throughout the day or have trouble in your daily routine due to sleepiness.  You continue to have sleep problems or your sleep problems get worse. SEEK IMMEDIATE MEDICAL CARE IF:   You have serious thoughts about hurting yourself or someone else.   This information is not intended to replace advice given to you by your health care provider. Make sure you discuss any questions you have with your health care provider.   Document Released: 07/26/2000 Document Revised: 04/19/2015 Document Reviewed: 04/29/2014 Elsevier  Interactive Patient Education [employer].

## 2016-03-02 ENCOUNTER — Other Ambulatory Visit: Payer: Self-pay | Admitting: Internal Medicine

## 2016-03-04 NOTE — Telephone Encounter (Signed)
Sent to the pharmacy by e-scribe. 

## 2016-03-06 ENCOUNTER — Encounter: Payer: Medicare Other | Admitting: Internal Medicine

## 2016-03-25 ENCOUNTER — Ambulatory Visit (INDEPENDENT_AMBULATORY_CARE_PROVIDER_SITE_OTHER): Payer: Medicare Other

## 2016-03-25 ENCOUNTER — Encounter (HOSPITAL_COMMUNITY): Payer: Self-pay | Admitting: Emergency Medicine

## 2016-03-25 ENCOUNTER — Ambulatory Visit (HOSPITAL_COMMUNITY)
Admission: EM | Admit: 2016-03-25 | Discharge: 2016-03-25 | Disposition: A | Discharge status: Home / Self Care | Payer: Medicare Other | Attending: Family Medicine | Admitting: Family Medicine

## 2016-03-25 DIAGNOSIS — M79605 Pain in left leg: Secondary | ICD-10-CM

## 2016-03-25 DIAGNOSIS — M1612 Unilateral primary osteoarthritis, left hip: Secondary | ICD-10-CM | POA: Diagnosis not present

## 2016-03-25 NOTE — ED Triage Notes (Signed)
PT fell two weeks ago and has lingering pain in left lower leg that shoots up left leg. PT complains of no other injuries. PT is ambulatory.

## 2016-03-25 NOTE — Discharge Instructions (Signed)
Heat to leg and advil as needed, activity as tolerated.

## 2016-03-25 NOTE — ED Provider Notes (Signed)
[place]    CSN: [id number] Arrival date & time: 03/25/16  1701  First Provider Contact:  First MD Initiated Contact with Patient 03/25/16 1759        History   Chief Complaint Chief Complaint  Patient presents with  . Leg Pain    HPI [name] [name] is a 75 y.o. female.   The history is provided by the patient.  Leg Pain  Location:  Leg Time since incident:  2 weeks Injury: yes   Mechanism of injury: fall   Mechanism of injury comment:  Fell and twisted left leg going down 3 cement front porch stairs with increasing pain 2days ago.  Fall:    Fall occurred:  Down stairs   Past Medical History:  Diagnosis Date  . Abdominal pain     Neg ct [place] and mri of back   . Adenomatous colon polyp   . Allergy   . Anemia   . Anxiety   . Colon polyp   . Cough    ent evaluation  . DJD (degenerative joint disease) of lumbar spine   . Estrogen deficiency   . Gastritis   . GERD (gastroesophageal reflux disease)   . Headache(784.0)   . Helicobacter pylori infection 10/13/2007   Qualifier: Diagnosis of  By: Marland Mcalpine    . History of positive PPD 10/16/2011  . HSV-2 infection   . Hyperglycemia   . Hyperlipidemia   . Hypertension   . IBS (irritable bowel syndrome)   . Leg cramps 11/05/2013   Appears to have been from the higher dose of diuretic better on low dose and magnesium.   . Pulsatile tinnitus    had MRA and MPV nl mild carotid dopplers2010  . Shingles 02/09/2011   Right upper face and eyelid but not involving the eye at this point.   . Sleep apnea    does not use c pap  . Thyroid disease   . Vulvitis     Patient Active Problem List   Diagnosis Date Noted  . Multinodular goiter 06/15/2015  . Post-surgical hypothyroidism 06/15/2015  . Leg pain, lateral 08/30/2014  . Hyperglycemia 11/05/2013  . Family history of Alzheimer's disease 11/05/2013  . Memory problem 11/05/2013  . Medication side effect 04/08/2013  . Lesion of spleen 04/08/2013    . Diarrhea 04/08/2013  . Arthritis of shoulder 11/03/2012  . History of back surgery 11/03/2012  . HSV-2 seropositive 02/17/2012  . Medicare annual wellness visit, subsequent 10/16/2011  . OSA (obstructive sleep apnea) 10/16/2011  . History of positive PPD 10/16/2011  . History of colonic polyps 07/18/2011  . Dyspnea 10/18/2010  . Heart burn 10/18/2010  . Chest pain 10/18/2010  . ADJUSTMENT DISORDER WITH ANXIETY 07/24/2010  . CHEST WALL PAIN, ANTERIOR 07/24/2010  . KNEE PAIN 04/04/2010  . SLEEP DISORDER/DISTURBANCE 11/21/2009  . LEG PAIN 02/28/2009  . TINNITUS 05/11/2008  . HEADACHE 02/07/2008  . UNSPECIFIED VITAMIN D DEFICIENCY 02/05/2008  . OBESITY, MILD 10/13/2007  . ANEMIA 10/13/2007  . GASTRITIS, CHRONIC 10/13/2007  . DUODENITIS, WITHOUT HEMORRHAGE 10/13/2007  . HIATAL HERNIA 10/13/2007  . IRRITABLE BOWEL SYNDROME 10/13/2007  . GRANULOMA 10/13/2007  . RHINITIS 09/23/2007  . FASTING HYPERGLYCEMIA 09/04/2007  . HYPERLIPIDEMIA 04/07/2007  . HYPERTENSION 04/07/2007  . GERD 04/07/2007    Past Surgical History:  Procedure Laterality Date  . ABDOMINAL HYSTERECTOMY  1982   fibroids  . COLONOSCOPY  2003- 2102   adenomatous polyps  . FINGER SURGERY  left middle A1pulley release  . isthmuectomy     thyroid surgery  . LUMBAR SPINE SURGERY  05/1989   spine d/t tumor neurofibroma Quentin Cornwall  . SHOULDER ARTHROSCOPY     left  . THYROIDECTOMY     partial rt  ballen  . TONSILLECTOMY     at age 46.   . TUBAL LIGATION  1974    OB History    Gravida Para Term Preterm AB Living   3 3           SAB TAB Ectopic Multiple Live Births                   Home Medications    Prior to Admission medications   Medication Sig Start Date End Date Taking? Authorizing Provider  acyclovir (ZOVIRAX) 400 MG tablet Take PO BID PRN 06/10/12   Eldred Manges, MD  Cholecalciferol (VITAMIN D) 1000 UNITS capsule Take 1,000 Units by mouth daily as needed.     Historical Provider, MD   cyanocobalamin 1000 MCG tablet Take 1,000 mcg by mouth daily. Reported on 03/01/2016    Historical Provider, MD  fluticasone (FLONASE) 50 MCG/ACT nasal spray INHALE 2 SPRAYS IN EACH NOSTRIL EVERY DAY FOR RHINITIS 11/09/14   Burnis Medin, MD  ibuprofen (ADVIL,MOTRIN) 600 MG tablet 1 po qd as needed 01/25/15   Burnis Medin, MD  MAGNESIUM PO Take by mouth.    Historical Provider, MD  SYNTHROID 100 MCG tablet TAKE 1 TABLET BY MOUTH EVERY DAY 03/04/16   Burnis Medin, MD  triamterene-hydrochlorothiazide (MAXZIDE-25) 37.5-25 MG tablet TAKE 1 TABLET BY MOUTH EVERY DAY 03/04/16   Burnis Medin, MD    Family History Family History  Problem Relation Age of Onset  . Alzheimer's disease Mother   . Heart attack Father   . Asthma Sister   . Colon cancer Sister   . Heart attack Brother   . Colon polyps Sister   . Colon cancer Sister   . Colon cancer Paternal Aunt   . Thyroid disease Sister     multinodular goiter  . Stomach cancer Neg Hx     Social History Social History  Substance Use Topics  . Smoking status: Former Smoker    Packs/day: 0.50    Years: 23.00    Types: Cigarettes    Quit date: 06/16/1984  . Smokeless tobacco: Never Used  . Alcohol use No     Allergies   Lisinopril; Lyrica [pregabalin]; Pravastatin; Simvastatin; Codeine phosphate; and Oxycodone hcl   Review of Systems Review of Systems  Constitutional: Negative.   Gastrointestinal: Negative.   Musculoskeletal: Positive for myalgias. Negative for gait problem and joint swelling.  Skin: Negative.   All other systems reviewed and are negative.    Physical Exam Triage Vital Signs ED Triage Vitals  Enc Vitals Group     BP 03/25/16 1754 119/62     Pulse Rate 03/25/16 1754 60     Resp --      Temp 03/25/16 1754 98.6 F (37 C)     Temp Source 03/25/16 1754 Oral     SpO2 03/25/16 1754 100 %     Weight --      Height --      Head Circumference --      Peak Flow --      Pain Score 03/25/16 1806 6     Pain  Loc --      Pain Edu? --  Excl. in GC? --    No data found.   Updated Vital Signs BP 119/62 (BP Location: Left Arm)   Pulse 60   Temp 98.6 F (37 C) (Oral)   SpO2 100%   Visual Acuity Right Eye Distance:   Left Eye Distance:   Bilateral Distance:    Right Eye Near:   Left Eye Near:    Bilateral Near:     Physical Exam  Constitutional: She is oriented to person, place, and time. She appears well-developed and well-nourished. No distress.  Musculoskeletal: Normal range of motion. She exhibits tenderness. She exhibits no deformity.       Left lower leg: She exhibits tenderness and swelling. She exhibits no bony tenderness, no edema, no deformity and no laceration.       Legs: Neurological: She is alert and oriented to person, place, and time.  Skin: Skin is warm and dry.  Nursing note and vitals reviewed.    UC Treatments / Results  Labs (all labs ordered are listed, but only abnormal results are displayed) Labs Reviewed - No data to display  EKG  EKG Interpretation None       Radiology No results found. X-rays reviewed and report per radiologist.  Procedures Procedures (including critical care time)  Medications Ordered in UC Medications - No data to display   Initial Impression / Assessment and Plan / UC Course  I have reviewed the triage vital signs and the nursing notes.  Pertinent labs & imaging results that were available during my care of the patient were reviewed by me and considered in my medical decision making (see chart for details).  Clinical Course     Final Clinical Impressions(s) / UC Diagnoses   Final diagnoses:  None    New Prescriptions New Prescriptions   No medications on file     Billy Fischer, MD 03/25/16 1927

## 2016-04-02 ENCOUNTER — Other Ambulatory Visit: Payer: Self-pay | Admitting: Internal Medicine

## 2016-04-02 DIAGNOSIS — Z1231 Encounter for screening mammogram for malignant neoplasm of breast: Secondary | ICD-10-CM

## 2016-04-20 ENCOUNTER — Other Ambulatory Visit: Payer: Self-pay | Admitting: Internal Medicine

## 2016-04-23 NOTE — Telephone Encounter (Signed)
Sent to the pharmacy by e-scribe. 

## 2016-04-30 ENCOUNTER — Ambulatory Visit
Admission: RE | Admit: 2016-04-30 | Discharge: 2016-04-30 | Disposition: A | Discharge status: Home / Self Care | Payer: Medicare Other | Source: Ambulatory Visit | Attending: Internal Medicine | Admitting: Internal Medicine

## 2016-04-30 DIAGNOSIS — Z1231 Encounter for screening mammogram for malignant neoplasm of breast: Secondary | ICD-10-CM | POA: Diagnosis not present

## 2016-06-18 ENCOUNTER — Telehealth: Payer: Self-pay | Admitting: Internal Medicine

## 2016-06-18 NOTE — Telephone Encounter (Signed)
Pt states she received a $25 check for her annual wellness. Very happy about that!!

## 2016-06-29 ENCOUNTER — Ambulatory Visit (HOSPITAL_COMMUNITY)
Admission: EM | Admit: 2016-06-29 | Discharge: 2016-06-29 | Disposition: A | Discharge status: Home / Self Care | Payer: Medicare Other | Attending: Family Medicine | Admitting: Family Medicine

## 2016-06-29 ENCOUNTER — Ambulatory Visit (INDEPENDENT_AMBULATORY_CARE_PROVIDER_SITE_OTHER): Payer: Medicare Other

## 2016-06-29 ENCOUNTER — Encounter (HOSPITAL_COMMUNITY): Payer: Self-pay | Admitting: Emergency Medicine

## 2016-06-29 DIAGNOSIS — M19012 Primary osteoarthritis, left shoulder: Secondary | ICD-10-CM

## 2016-06-29 DIAGNOSIS — M25512 Pain in left shoulder: Secondary | ICD-10-CM | POA: Diagnosis not present

## 2016-06-29 DIAGNOSIS — G8929 Other chronic pain: Secondary | ICD-10-CM

## 2016-06-29 DIAGNOSIS — M609 Myositis, unspecified: Secondary | ICD-10-CM | POA: Diagnosis not present

## 2016-06-29 DIAGNOSIS — J069 Acute upper respiratory infection, unspecified: Secondary | ICD-10-CM | POA: Diagnosis not present

## 2016-06-29 DIAGNOSIS — B9789 Other viral agents as the cause of diseases classified elsewhere: Secondary | ICD-10-CM

## 2016-06-29 MED ORDER — NAPROXEN 250 MG PO TABS: 250.0000 mg | ORAL_TABLET | Freq: Two times a day (BID) | ORAL | 0 refills | Status: DC

## 2016-06-29 MED ORDER — FEXOFENADINE HCL 60 MG PO TABS: 60.0000 mg | ORAL_TABLET | Freq: Two times a day (BID) | ORAL | 0 refills | Status: DC

## 2016-06-29 NOTE — ED Triage Notes (Signed)
C/o bilateral shoulder pain onset 6 months... Reports hx of right shoulder surgery  States pain has increased in the last 2 months and believes cold sx also have aggravated sx.   Denies inj/trauma  Cold sx began x1 month associated w/prod cough, nasal congestion/drainage  A&O x4... NAD

## 2016-06-29 NOTE — Discharge Instructions (Signed)
You have inflammation all over shoulder associated with arthritis. Apply ice 3-4x per day. Call orthopedic doctor on Monday to make appointment for further work up.  You have a upper respiratory infection Drink plenty of fluids and rest

## 2016-06-29 NOTE — ED Provider Notes (Signed)
CSN: [id number]     Arrival date & time 06/29/16  1201 History   First MD Initiated Contact with Patient 06/29/16 1302     Chief Complaint  Patient presents with  . URI  . Shoulder Pain   (Consider location/radiation/quality/duration/timing/severity/associated sxs/prior Treatment) HPI The patient is a 75 y.o. female who presents to UC for congestion, post nasal drip and productive cough along with left shoulder pain.  Congestion has been present for 4 weeks and cough productive of yellow/green thick sputum began 3 weeks ago. States that her throat has begun hurting as well, likely secondary to her post nasal drip.  Notes that her cough seems to be getting better however her chest feels a little tight.  She states that she is occasionally short of breath with daily activities but that this has been going on for over a year. Has not smoked since 1985. Denies wheezing or history of asthma or COPD. Denies fever, sneezing, ear pain, abdominal pain, nausea, vomiting, diarrhea.  Left shoulder pain began about 4-5 months ago which she believes may be worsening secondary to the cough. Pain is diffuse throughout left shoulder, worse in bicep tendon area and occasionally goes across posterior neck into right shouleder. States that after she uses the arm a lot it tends to loosen up a little but still has trouble washing and curling hair secondary to pain and limited ROM. Notes hx of fall in July where she fell down stairs outside and landed on left side. Was evaluated at [facility] for left hip pain and did not think she injured her shoulder but is now thinking that may be the cause. Also notes hx of rotator cuff surgery in right arm about 1.5 yrs ago and states that she may have been overusing left arm due to this.  Also has hx of rotator cuff surgery in left shoulder as well. Denies any other known injury.    Past Medical History:  Diagnosis Date  . Abdominal pain     Neg ct [place] and mri of back   . Adenomatous  colon polyp   . Allergy   . Anemia   . Anxiety   . Colon polyp   . Cough    ent evaluation  . DJD (degenerative joint disease) of lumbar spine   . Estrogen deficiency   . Gastritis   . GERD (gastroesophageal reflux disease)   . Headache(784.0)   . Helicobacter pylori infection 10/13/2007   Qualifier: Diagnosis of  By: Marland Mcalpine    . History of positive PPD 10/16/2011  . HSV-2 infection   . Hyperglycemia   . Hyperlipidemia   . Hypertension   . IBS (irritable bowel syndrome)   . Leg cramps 11/05/2013   Appears to have been from the higher dose of diuretic better on low dose and magnesium.   . Pulsatile tinnitus    had MRA and MPV nl mild carotid dopplers2010  . Shingles 02/09/2011   Right upper face and eyelid but not involving the eye at this point.   . Sleep apnea    does not use c pap  . Thyroid disease   . Vulvitis    Past Surgical History:  Procedure Laterality Date  . ABDOMINAL HYSTERECTOMY  1982   fibroids  . COLONOSCOPY  2003- 2102   adenomatous polyps  . FINGER SURGERY     left middle A1pulley release  . isthmuectomy     thyroid surgery  . LUMBAR SPINE SURGERY  05/1989  spine d/t tumor neurofibroma Quentin Cornwall  . SHOULDER ARTHROSCOPY     left  . THYROIDECTOMY     partial rt  ballen  . TONSILLECTOMY     at age 54.   . TUBAL LIGATION  1974   Family History  Problem Relation Age of Onset  . Alzheimer's disease Mother   . Heart attack Father   . Asthma Sister   . Colon cancer Sister   . Heart attack Brother   . Colon polyps Sister   . Colon cancer Sister   . Colon cancer Paternal Aunt   . Thyroid disease Sister     multinodular goiter  . Stomach cancer Neg Hx    Social History  Substance Use Topics  . Smoking status: Former Smoker    Packs/day: 0.50    Years: 23.00    Types: Cigarettes    Quit date: 06/16/1984  . Smokeless tobacco: Never Used  . Alcohol use No   OB History    Gravida Para Term Preterm AB Living   3 3           SAB TAB  Ectopic Multiple Live Births                 Review of Systems  Constitutional: Negative for chills, diaphoresis, fatigue and fever.  HENT: Positive for congestion, postnasal drip and sore throat. Negative for ear pain, rhinorrhea, sinus pain, sinus pressure, sneezing and trouble swallowing.   Eyes: Negative for discharge and itching.  Respiratory: Positive for cough, chest tightness and shortness of breath. Negative for wheezing and stridor.   Cardiovascular: Negative for chest pain.  Gastrointestinal: Negative for abdominal pain, constipation, diarrhea, nausea and vomiting.  Genitourinary: Negative for dysuria and frequency.  Musculoskeletal: Positive for arthralgias, myalgias (left shoulder and neck) and neck pain.  Skin: Negative for rash.  Neurological: Negative for headaches.    Allergies  Lisinopril; Lyrica [pregabalin]; Pravastatin; Simvastatin; Codeine phosphate; and Oxycodone hcl  Home Medications   Prior to Admission medications   Medication Sig Start Date End Date Taking? Authorizing Provider  acyclovir (ZOVIRAX) 400 MG tablet Take PO BID PRN 06/10/12  Yes Eldred Manges, MD  Cholecalciferol (VITAMIN D) 1000 UNITS capsule Take 1,000 Units by mouth daily as needed.    Yes Historical Provider, MD  cyanocobalamin 1000 MCG tablet Take 1,000 mcg by mouth daily. Reported on 03/01/2016   Yes Historical Provider, MD  SYNTHROID 100 MCG tablet TAKE 1 TABLET BY MOUTH EVERY DAY 03/04/16  Yes Burnis Medin, MD  triamterene-hydrochlorothiazide (MAXZIDE-25) 37.5-25 MG tablet TAKE 1 TABLET BY MOUTH EVERY DAY 03/04/16  Yes Burnis Medin, MD  fexofenadine (ALLEGRA) 60 MG tablet Take 1 tablet (60 mg total) by mouth 2 (two) times daily. 06/29/16   Janne Napoleon, NP  fluticasone (FLONASE) 50 MCG/ACT nasal spray INHALE 2 SPRAYS IN EACH NOSTRIL EVERY DAY FOR RHINITIS 04/23/16   Burnis Medin, MD  MAGNESIUM PO Take by mouth.    Historical Provider, MD  naproxen (NAPROSYN) 250 MG tablet Take 1  tablet (250 mg total) by mouth 2 (two) times daily with a meal. 06/29/16   Janne Napoleon, NP   Meds Ordered and Administered this Visit  Medications - No data to display  BP 160/80 (BP Location: Left Arm)   Pulse 76   Temp 98.1 F (36.7 C) (Oral)   Resp 16   SpO2 97%  No data found.   Physical Exam  Constitutional: She is oriented to person, place,  and time. She appears well-developed and well-nourished.  HENT:  Head: Normocephalic and atraumatic.  Right Ear: Hearing, tympanic membrane, external ear and ear canal normal.  Left Ear: Hearing, tympanic membrane, external ear and ear canal normal.  Nose: Nose normal. Right sinus exhibits no maxillary sinus tenderness and no frontal sinus tenderness. Left sinus exhibits no maxillary sinus tenderness and no frontal sinus tenderness.  Mouth/Throat: Uvula is midline and mucous membranes are normal. Posterior oropharyngeal erythema (Cobblestoning) present. No tonsillar exudate.  Eyes: Conjunctivae and EOM are normal. Right eye exhibits no discharge. Left eye exhibits no discharge.  Neck: Normal range of motion. Neck supple.  Cardiovascular: Normal rate and regular rhythm.   Pulmonary/Chest: Effort normal and breath sounds normal. No respiratory distress. She has no wheezes. She has no rales. She exhibits no tenderness.  Musculoskeletal:       Right shoulder: She exhibits normal range of motion, no tenderness and no bony tenderness.       Left shoulder: She exhibits decreased range of motion and tenderness. She exhibits no swelling and no spasm.       Cervical back: She exhibits normal range of motion, no tenderness, no swelling and no deformity.  Left shoulder pain and tenderness over left upper trapezius and diffuse over left shoulder joint into left distal clavicular area. ROM is decreased both actively and passively. Abduction limited to about 80 degrees. Internal and external rotation limited  About 45 degree external rotation.   Lymphadenopathy:    She has no cervical adenopathy.  Neurological: She is alert and oriented to person, place, and time.  Skin: Skin is warm and dry. Capillary refill takes less than 2 seconds. No rash noted. No erythema.  Psychiatric: She has a normal mood and affect. Her behavior is normal. Judgment and thought content normal.  Nursing note and vitals reviewed.   Urgent Care Course   Clinical Course    Procedures (including critical care time)   Labs Review Labs Reviewed - No data to display  Imaging Review Dg Shoulder Left  Result Date: 06/29/2016 CLINICAL DATA:  Left shoulder pain for 1 month EXAM: LEFT SHOULDER - 2+ VIEW COMPARISON:  None. FINDINGS: No acute fracture. No dislocation. Unremarkable soft tissues. Mild degenerative change of the glenohumeral and AC joints. IMPRESSION: No acute bony pathology. Electronically Signed   By: Marybelle Killings M.D.   On: 06/29/2016 14:14    MDM   1. Chronic left shoulder pain   2. Arthropathy of left shoulder   3. Myofascitis   4. Viral upper respiratory tract infection    75 y.o. presents for productive cough and congestion x [redacted] weeks along with left shoulder pain x 5 months unsure of injury.   Suspect that cough is secondary to bronchospasm and post nasal drip. Lung sounds clear bilaterally.  Left shoulder x-ray:   You have inflammation all over shoulder associated with arthritis. Apply ice 3-4x per day. Call orthopedic doctor on Monday to make appointment for further work up.  You have an upper respiratory infection Drink plenty of fluids and rest  Meds ordered this encounter  Medications  . naproxen (NAPROSYN) 250 MG tablet    Sig: Take 1 tablet (250 mg total) by mouth 2 (two) times daily with a meal.    Dispense:  15 tablet    Refill:  0    Order Specific Question:   Supervising Provider    Answer:   Melony Overly [id number]  . fexofenadine (ALLEGRA) 60 MG tablet  Sig: Take 1 tablet (60 mg total) by mouth 2 (two) times  daily.    Dispense:  30 tablet    Refill:  0    Order Specific Question:   Supervising Provider    Answer:   Carmela Hurt     Janne Napoleon, NP 06/29/16 1459

## 2016-07-10 DIAGNOSIS — M7542 Impingement syndrome of left shoulder: Secondary | ICD-10-CM | POA: Diagnosis not present

## 2016-08-07 DIAGNOSIS — M7542 Impingement syndrome of left shoulder: Secondary | ICD-10-CM | POA: Diagnosis not present

## 2016-08-07 DIAGNOSIS — M7541 Impingement syndrome of right shoulder: Secondary | ICD-10-CM | POA: Diagnosis not present

## 2016-08-15 DIAGNOSIS — M7542 Impingement syndrome of left shoulder: Secondary | ICD-10-CM | POA: Diagnosis not present

## 2016-08-21 DIAGNOSIS — M19012 Primary osteoarthritis, left shoulder: Secondary | ICD-10-CM | POA: Diagnosis not present

## 2016-08-21 DIAGNOSIS — G8929 Other chronic pain: Secondary | ICD-10-CM | POA: Diagnosis not present

## 2016-08-21 DIAGNOSIS — M25512 Pain in left shoulder: Secondary | ICD-10-CM | POA: Diagnosis not present

## 2016-09-05 ENCOUNTER — Ambulatory Visit (INDEPENDENT_AMBULATORY_CARE_PROVIDER_SITE_OTHER): Payer: Medicare Other | Admitting: Family Medicine

## 2016-09-05 DIAGNOSIS — Z23 Encounter for immunization: Secondary | ICD-10-CM

## 2016-09-12 ENCOUNTER — Encounter: Payer: Self-pay | Admitting: Family Medicine

## 2016-09-12 DIAGNOSIS — H35363 Drusen (degenerative) of macula, bilateral: Secondary | ICD-10-CM | POA: Diagnosis not present

## 2016-09-12 DIAGNOSIS — Z961 Presence of intraocular lens: Secondary | ICD-10-CM | POA: Diagnosis not present

## 2016-09-12 DIAGNOSIS — H33322 Round hole, left eye: Secondary | ICD-10-CM | POA: Diagnosis not present

## 2016-09-12 DIAGNOSIS — H35372 Puckering of macula, left eye: Secondary | ICD-10-CM | POA: Diagnosis not present

## 2016-09-12 DIAGNOSIS — H33193 Other retinoschisis and retinal cysts, bilateral: Secondary | ICD-10-CM | POA: Diagnosis not present

## 2016-09-12 LAB — HM DIABETES EYE EXAM

## 2016-09-17 ENCOUNTER — Encounter: Payer: Self-pay | Admitting: Family Medicine

## 2016-10-02 DIAGNOSIS — Z01419 Encounter for gynecological examination (general) (routine) without abnormal findings: Secondary | ICD-10-CM | POA: Diagnosis not present

## 2016-10-02 DIAGNOSIS — B009 Herpesviral infection, unspecified: Secondary | ICD-10-CM | POA: Diagnosis not present

## 2016-10-02 DIAGNOSIS — Z6834 Body mass index (BMI) 34.0-34.9, adult: Secondary | ICD-10-CM | POA: Diagnosis not present

## 2016-10-02 DIAGNOSIS — N952 Postmenopausal atrophic vaginitis: Secondary | ICD-10-CM | POA: Diagnosis not present

## 2016-10-02 DIAGNOSIS — L293 Anogenital pruritus, unspecified: Secondary | ICD-10-CM | POA: Diagnosis not present

## 2016-10-02 DIAGNOSIS — N898 Other specified noninflammatory disorders of vagina: Secondary | ICD-10-CM | POA: Diagnosis not present

## 2016-12-17 NOTE — Progress Notes (Deleted)
Subjective:   [name] [name] is a 76 y.o. female who presents for Medicare Annual (Subsequent) preventive examination.     HRA assessment completed during this visit with   The Patient was informed that the wellness visit is to identify future health risk and educate and initiate measures that can reduce risk for increased disease through the lifespan.    NO ROS; Medicare Wellness Visit Last OV:   Labs completed:   Describes health as fair, good or great?   Update:  Tobacco:  2nd Hand Smoke Chew or electronic cigarettes ETOH:   Medications  BMI:   Diet;    Issues with teeth   Exercise;    Safety features reviewed for safe community;  firearms if in the home;  smoke alarms;  sun protection when outside;  driving difficulties or accidents  Advanced Directive  .[place];  Fall hx;  Fall risk: Fear of falling?  Gait:  Given education on "Fall Prevention in the Home" for more safety tips the patient can apply as appropriate.  Long term goal is to "age in place"    Mental Health:  Any emotional problems? Anxious, depressed, irritable, sad or blue?  Denies feeling depressed or hopeless; voices pleasure in daily life How many social activities have you been engaged in within the last 2 weeks? Who would help you with chores; illness; shopping other?  Pain:   Cognitive;  Manages checkbook, medications; no failures of task Ad8 score reviewed for issues;  Issues making decisions; no  Less interest in hobbies / activities" no  Repeats questions, stories; family complaining: NO  Trouble using ordinary gadgets; microwave; computer: no  Forgets the month or year: no  Mismanaging finances: no  Missing apt: no but does write them down  Daily problems with thinking of memory NO Ad8 score is 0   Any dizziness when standing up?   Mobilization and Functional losses from last year to this year?   Sleep pattern changes;    Advanced Directive  addressed; Completed or educated    Health Maintenance Colonoscopy;  EKG:  Mammogram:  Dexa/  PAP: educated regarding the need for GYN exam;  Prostate cancer screening:     Immunizations Due: (Vaccines reviewed and educated regarding any overdue)     Individual Goal:   Health Recommendations and Referrals  Barriers to Success    Current Care Team reviewed and updated   Education provided and lifestyle risk discussed   All Health Maintenance Gaps Reviewed for closure     Objective:     Vitals: There were no vitals taken for this visit.  There is no height or weight on file to calculate BMI.   Tobacco History  Smoking Status  . Former Smoker  . Packs/day: 0.50  . Years: 23.00  . Types: Cigarettes  . Quit date: 06/16/1984  Smokeless Tobacco  . Never Used     Counseling given: Not Answered   Past Medical History:  Diagnosis Date  . Abdominal pain     Neg ct [place] and mri of back   . Adenomatous colon polyp   . Allergy   . Anemia   . Anxiety   . Colon polyp   . Cough    ent evaluation  . DJD (degenerative joint disease) of lumbar spine   . Estrogen deficiency   . Gastritis   . GERD (gastroesophageal reflux disease)   . Headache(784.0)   . Helicobacter pylori infection 10/13/2007   Qualifier: Diagnosis  of  By: Marland Mcalpine    . History of positive PPD 10/16/2011  . HSV-2 infection   . Hyperglycemia   . Hyperlipidemia   . Hypertension   . IBS (irritable bowel syndrome)   . Leg cramps 11/05/2013   Appears to have been from the higher dose of diuretic better on low dose and magnesium.   . Pulsatile tinnitus    had MRA and MPV nl mild carotid dopplers2010  . Shingles 02/09/2011   Right upper face and eyelid but not involving the eye at this point.   . Sleep apnea    does not use c pap  . Thyroid disease   . Vulvitis    Past Surgical History:  Procedure Laterality Date  . ABDOMINAL HYSTERECTOMY  1982   fibroids  . COLONOSCOPY  2003- 2102    adenomatous polyps  . FINGER SURGERY     left middle A1pulley release  . isthmuectomy     thyroid surgery  . LUMBAR SPINE SURGERY  05/1989   spine d/t tumor neurofibroma Quentin Cornwall  . SHOULDER ARTHROSCOPY     left  . THYROIDECTOMY     partial rt  ballen  . TONSILLECTOMY     at age 35.   . TUBAL LIGATION  1974   Family History  Problem Relation Age of Onset  . Alzheimer's disease Mother   . Heart attack Father   . Asthma Sister   . Colon cancer Sister   . Heart attack Brother   . Colon polyps Sister   . Colon cancer Sister   . Colon cancer Paternal Aunt   . Thyroid disease Sister     multinodular goiter  . Stomach cancer Neg Hx    History  Sexual Activity  . Sexual activity: Not Currently  . Partners: Male  . Birth control/ protection: Surgical    Outpatient Encounter Prescriptions as of 12/18/2016  Medication Sig  . acyclovir (ZOVIRAX) 400 MG tablet Take PO BID PRN  . Cholecalciferol (VITAMIN D) 1000 UNITS capsule Take 1,000 Units by mouth daily as needed.   . cyanocobalamin 1000 MCG tablet Take 1,000 mcg by mouth daily. Reported on 03/01/2016  . fexofenadine (ALLEGRA) 60 MG tablet Take 1 tablet (60 mg total) by mouth 2 (two) times daily.  . fluticasone (FLONASE) 50 MCG/ACT nasal spray INHALE 2 SPRAYS IN EACH NOSTRIL EVERY DAY FOR RHINITIS  . MAGNESIUM PO Take by mouth.  . naproxen (NAPROSYN) 250 MG tablet Take 1 tablet (250 mg total) by mouth 2 (two) times daily with a meal.  . SYNTHROID 100 MCG tablet TAKE 1 TABLET BY MOUTH EVERY DAY  . triamterene-hydrochlorothiazide (MAXZIDE-25) 37.5-25 MG tablet TAKE 1 TABLET BY MOUTH EVERY DAY   No facility-administered encounter medications on file as of 12/18/2016.     Activities of Daily Living No flowsheet data found.  Patient Care Team: Burnis Medin, MD as PCP - Adelene Idler, MD Leo Grosser Seymour Bars, MD (Obstetrics and Gynecology) Gatha Mayer, MD (Gastroenterology) Justice Britain, MD as Consulting  Physician (Orthopedic Surgery) Monna Fam, MD as Consulting Physician (Ophthalmology)    Assessment:    *** Exercise Activities and Dietary recommendations    Goals    None     Fall Risk Fall Risk  03/01/2016 01/25/2015 11/05/2013  Falls in the past year? No No No   Depression Screen PHQ 2/9 Scores 01/25/2015 11/05/2013  PHQ - 2 Score 0 0     Cognitive Function  Immunization History  Administered Date(s) Administered  . Influenza Split 06/20/2011, 06/23/2012  . Influenza Whole 05/17/2008, 06/11/2010  . Influenza, High Dose Seasonal PF 05/18/2015, 09/05/2016  . Influenza,inj,Quad PF,36+ Mos 04/08/2013, 05/25/2014  . Pneumococcal Conjugate-13 11/05/2013  . Pneumococcal Polysaccharide-23 02/05/2008  . Td 08/12/2004  . Tdap 06/23/2012  . Zoster 03/28/2011   Screening Tests Health Maintenance  Topic Date Due  . INFLUENZA VACCINE  03/12/2017  . COLONOSCOPY  09/15/2017  . MAMMOGRAM  04/30/2018  . TETANUS/TDAP  06/23/2022  . DEXA SCAN  Completed  . PNA vac Low Risk Adult  Completed      Plan:   ***   I have personally reviewed and noted the following in the patient's chart:   . Medical and social history . Use of alcohol, tobacco or illicit drugs  . Current medications and supplements . Functional ability and status . Nutritional status . Physical activity . Advanced directives . List of other physicians . Hospitalizations, surgeries, and ER visits in previous 12 months . Vitals . Screenings to include cognitive, depression, and falls . Referrals and appointments  In addition, I have reviewed and discussed with patient certain preventive protocols, quality metrics, and best practice recommendations. A written personalized care plan for preventive services as well as general preventive health recommendations were provided to patient.     Wynetta Fines, RN  12/17/2016

## 2016-12-18 ENCOUNTER — Ambulatory Visit: Payer: Medicare Other

## 2017-01-19 NOTE — Progress Notes (Signed)
Chief Complaint  Patient presents with  . Acute Visit    feeling numbness in the legs/ also feel tingling     HPI: [name] [name] 76 y.o. comes in today with about 2+ weeks of increasing tingling right leg more than left all the way down to her toes. She has remote history of back surgery on the right and is always had a little weakness on her right leg however the symptoms are new. She's had no falling only some mild pain call Dr. Melven Sartorius office and they don't have an appointment until the end of July. Dr. Quentin Cornwall was the surgeon who did her original surgery. She hasn't taken B12 for at least 5 months. No other new medications. No major change in bowel or bladder. Hx of neuro fibroma resection  ROS: See pertinent positives and negatives per HPI. No falling   Past Medical History:  Diagnosis Date  . Abdominal pain     Neg ct [place] and mri of back   . Adenomatous colon polyp   . Allergy   . Anemia   . Anxiety   . Colon polyp   . Cough    ent evaluation  . DJD (degenerative joint disease) of lumbar spine   . Estrogen deficiency   . Gastritis   . GERD (gastroesophageal reflux disease)   . Headache(784.0)   . Helicobacter pylori infection 10/13/2007   Qualifier: Diagnosis of  By: Marland Mcalpine    . History of positive PPD 10/16/2011  . HSV-2 infection   . Hyperglycemia   . Hyperlipidemia   . Hypertension   . IBS (irritable bowel syndrome)   . Leg cramps 11/05/2013   Appears to have been from the higher dose of diuretic better on low dose and magnesium.   . Pulsatile tinnitus    had MRA and MPV nl mild carotid dopplers2010  . Shingles 02/09/2011   Right upper face and eyelid but not involving the eye at this point.   . Sleep apnea    does not use c pap  . Thyroid disease   . Vulvitis     Family History  Problem Relation Age of Onset  . Alzheimer's disease Mother   . Heart attack Father   . Asthma Sister   . Colon cancer Sister   . Heart attack Brother   .  Colon polyps Sister   . Colon cancer Sister   . Colon cancer Paternal Aunt   . Thyroid disease Sister        multinodular goiter  . Stomach cancer Neg Hx     Social History   Social History  . Marital status: Single    Spouse name: widowed.   . Number of children: Y  . Years of education: N/A   Occupational History  . retired.     [employer]----now retired.  .  Retired   Social History Main Topics  . Smoking status: Former Smoker    Packs/day: 0.50    Years: 23.00    Types: Cigarettes    Quit date: 06/16/1984  . Smokeless tobacco: Never Used  . Alcohol use No  . Drug use: No  . Sexual activity: Not Currently    Partners: Male    Birth control/ protection: Surgical   Other Topics Concern  . None   Social History Narrative   Retired    former smoker    widowed fall 2010 from myeloma. Lives alone.    [place] [address]  No pets  Taking care of  86 yo gc and now newborn in the fall     takign time off of child care     Outpatient Medications Prior to Visit  Medication Sig Dispense Refill  . acyclovir (ZOVIRAX) 400 MG tablet Take PO BID PRN 60 tablet 12  . fexofenadine (ALLEGRA) 60 MG tablet Take 1 tablet (60 mg total) by mouth 2 (two) times daily. 30 tablet 0  . fluticasone (FLONASE) 50 MCG/ACT nasal spray INHALE 2 SPRAYS IN EACH NOSTRIL EVERY DAY FOR RHINITIS 16 g 5  . MAGNESIUM PO Take by mouth.    . naproxen (NAPROSYN) 250 MG tablet Take 1 tablet (250 mg total) by mouth 2 (two) times daily with a meal. 15 tablet 0  . SYNTHROID 100 MCG tablet TAKE 1 TABLET BY MOUTH EVERY DAY 90 tablet 2  . triamterene-hydrochlorothiazide (MAXZIDE-25) 37.5-25 MG tablet TAKE 1 TABLET BY MOUTH EVERY DAY 90 tablet 2  . cyanocobalamin 1000 MCG tablet Take 1,000 mcg by mouth daily. Reported on 03/01/2016    . Cholecalciferol (VITAMIN D) 1000 UNITS capsule Take 1,000 Units by mouth daily as needed.      No facility-administered medications prior to visit.      EXAM:  BP 130/70 (BP Location: Right  Arm, Patient Position: Sitting, Cuff Size: Large)   Pulse 70   Temp 98.2 F (36.8 C) (Oral)   Wt 214 lb (97.1 kg)   BMI 35.07 kg/m   Body mass index is 35.07 kg/m.  GENERAL: vitals reviewed and listed above, alert, oriented, appears well hydrated and in no acute distress HEENT: atraumatic, conjunctiva  clear, no obvious abnormalities on inspection of external nose and earsNECK: no obvious masses on inspection palpation  LUNGS: clear to auscultation bilaterally, no wheezes, rales or rhonchi, good air movement CV: HRRR, no clubbing cyanosis or  peripheral edema nl cap refill  MS: moves all extremities without noticeable focal  Abnormality Back some tenderness right buttock area  Neg slr?  Legs no obv atrophy heel toe elevation seems ok  dtrs hard to elicit  No foot lesion  Sensation  Grossly tested  PSYCH: pleasant and cooperative, no obvious depression or anxiety   ASSESSMENT AND PLAN:  Discussed the following assessment and plan:  Paresthesia of bilateral legs  Tingling - Plan: Basic metabolic panel, CBC with Differential/Platelet, Hemoglobin A1c, Vitamin B12, TSH  Pain of lower extremity, unspecified laterality - Plan: Basic metabolic panel, CBC with Differential/Platelet, Hemoglobin A1c, Vitamin B12, TSH  History of back surgery neurofibroma removal  - Plan: Basic metabolic panel, CBC with Differential/Platelet, Hemoglobin A1c, Vitamin B12  Hyperglycemia - Plan: Basic metabolic panel, CBC with Differential/Platelet, Hemoglobin A1c, Vitamin B12  Multinodular goiter - Plan: TSH Her predominant right leg tingling is probably related to her back however she does reported bilaterally although worse on the right feels it's related to her previous predicament. Was unable to get an appointment with the neurosurgery office so came in for my opinion. Will check metabolic as discussed depending on results plan consideration of imaging and referral back to neurosurgery for opinion.  Discussed alarm symptoms for which she should seek emergent care. At this time she appears stable   Last mri 2014 [facility]  Facet arthropathy  -Patient advised to return or notify health care team  if symptoms worsen ,persist or new concerns arise.  Patient Instructions  I think your symptoms may be related to a back problem. However we are checking for diabetes kidney  problems B12 deficiency and other things that can cause tingling and numbness. We'll let you know results when they're available Depending on results next step would be getting you back to neurosurgery and or imaging study at that time. We'll let you know next step I think you are safe at this time however if you have progression of symptoms severe pain or weakness contact the care team for follow-up.   Paresthesia Paresthesia is an abnormal burning or prickling sensation. This sensation is generally felt in the hands, arms, legs, or feet. However, it may occur in any part of the body. Usually, it is not painful. The feeling may be described as:  Tingling or numbness.  Pins and needles.  Skin crawling.  Buzzing.  Limbs falling asleep.  Itching.  Most people experience temporary (transient) paresthesia at some time in their lives. Paresthesia may occur when you breathe too quickly (hyperventilation). It can also occur without any apparent cause. Commonly, paresthesia occurs when pressure is placed on a nerve. The sensation quickly goes away after the pressure is removed. For some people, however, paresthesia is a long-lasting (chronic) condition that is caused by an underlying disorder. If you continue to have paresthesia, you may need further medical evaluation. Follow these instructions at home: Watch your condition for any changes. Taking the following actions may help to lessen any discomfort that you are feeling:  Avoid drinking alcohol.  Try acupuncture or massage to help relieve your symptoms.  Keep  all follow-up visits as directed by your health care provider. This is important.  Contact a health care provider if:  You continue to have episodes of paresthesia.  Your burning or prickling feeling gets worse when you walk.  You have pain, cramps, or dizziness.  You develop a rash. Get help right away if:  You feel weak.  You have trouble walking or moving.  You have problems with speech, understanding, or vision.  You feel confused.  You cannot control your bladder or bowel movements.  You have numbness after an injury.  You faint. This information is not intended to replace advice given to you by your health care provider. Make sure you discuss any questions you have with your health care provider. Document Released: 07/19/2002 Document Revised: 01/04/2016 Document Reviewed: 07/25/2014 Elsevier Interactive Patient Education  2018 [place]. Chalmers Iddings M.D.

## 2017-01-20 ENCOUNTER — Encounter: Payer: Self-pay | Admitting: Internal Medicine

## 2017-01-20 ENCOUNTER — Ambulatory Visit (INDEPENDENT_AMBULATORY_CARE_PROVIDER_SITE_OTHER): Payer: Medicare Other | Admitting: Internal Medicine

## 2017-01-20 VITALS — BP 130/70 | HR 70 | Temp 98.2°F | Wt 214.0 lb

## 2017-01-20 DIAGNOSIS — Z9889 Other specified postprocedural states: Secondary | ICD-10-CM | POA: Diagnosis not present

## 2017-01-20 DIAGNOSIS — R202 Paresthesia of skin: Secondary | ICD-10-CM | POA: Diagnosis not present

## 2017-01-20 DIAGNOSIS — E042 Nontoxic multinodular goiter: Secondary | ICD-10-CM

## 2017-01-20 DIAGNOSIS — M79606 Pain in leg, unspecified: Secondary | ICD-10-CM

## 2017-01-20 DIAGNOSIS — R739 Hyperglycemia, unspecified: Secondary | ICD-10-CM | POA: Diagnosis not present

## 2017-01-20 NOTE — Patient Instructions (Signed)
I think your symptoms may be related to a back problem. However we are checking for diabetes kidney problems B12 deficiency and other things that can cause tingling and numbness. We'll let you know results when they're available Depending on results next step would be getting you back to neurosurgery and or imaging study at that time. We'll let you know next step I think you are safe at this time however if you have progression of symptoms severe pain or weakness contact the care team for follow-up.   Paresthesia Paresthesia is an abnormal burning or prickling sensation. This sensation is generally felt in the hands, arms, legs, or feet. However, it may occur in any part of the body. Usually, it is not painful. The feeling may be described as:  Tingling or numbness.  Pins and needles.  Skin crawling.  Buzzing.  Limbs falling asleep.  Itching.  Most people experience temporary (transient) paresthesia at some time in their lives. Paresthesia may occur when you breathe too quickly (hyperventilation). It can also occur without any apparent cause. Commonly, paresthesia occurs when pressure is placed on a nerve. The sensation quickly goes away after the pressure is removed. For some people, however, paresthesia is a long-lasting (chronic) condition that is caused by an underlying disorder. If you continue to have paresthesia, you may need further medical evaluation. Follow these instructions at home: Watch your condition for any changes. Taking the following actions may help to lessen any discomfort that you are feeling:  Avoid drinking alcohol.  Try acupuncture or massage to help relieve your symptoms.  Keep all follow-up visits as directed by your health care provider. This is important.  Contact a health care provider if:  You continue to have episodes of paresthesia.  Your burning or prickling feeling gets worse when you walk.  You have pain, cramps, or dizziness.  You develop a  rash. Get help right away if:  You feel weak.  You have trouble walking or moving.  You have problems with speech, understanding, or vision.  You feel confused.  You cannot control your bladder or bowel movements.  You have numbness after an injury.  You faint. This information is not intended to replace advice given to you by your health care provider. Make sure you discuss any questions you have with your health care provider. Document Released: 07/19/2002 Document Revised: 01/04/2016 Document Reviewed: 07/25/2014 Elsevier Interactive Patient Education  [employer].

## 2017-01-21 LAB — VITAMIN B12: Vitamin B-12: 429 pg/mL (ref 211–911)

## 2017-01-21 LAB — CBC WITH DIFFERENTIAL/PLATELET
Basophils Absolute: 0 10*3/uL (ref 0.0–0.1)
Basophils Relative: 0.7 % (ref 0.0–3.0)
Eosinophils Absolute: 0.3 10*3/uL (ref 0.0–0.7)
Eosinophils Relative: 3.4 % (ref 0.0–5.0)
HCT: 37.9 % (ref 36.0–46.0)
Hemoglobin: 12.1 g/dL (ref 12.0–15.0)
Lymphocytes Relative: 40.9 % (ref 12.0–46.0)
Lymphs Abs: 3.1 10*3/uL (ref 0.7–4.0)
MCHC: 32 g/dL (ref 30.0–36.0)
MCV: 90.1 fl (ref 78.0–100.0)
Monocytes Absolute: 0.6 10*3/uL (ref 0.1–1.0)
Monocytes Relative: 7.7 % (ref 3.0–12.0)
Neutro Abs: 3.5 10*3/uL (ref 1.4–7.7)
Neutrophils Relative %: 47.3 % (ref 43.0–77.0)
Platelets: 208 10*3/uL (ref 150.0–400.0)
RBC: 4.21 Mil/uL (ref 3.87–5.11)
RDW: 13.4 % (ref 11.5–15.5)
WBC: 7.5 10*3/uL (ref 4.0–10.5)

## 2017-01-21 LAB — BASIC METABOLIC PANEL
BUN: 23 mg/dL (ref 6–23)
CO2: 28 mEq/L (ref 19–32)
Calcium: 9.6 mg/dL (ref 8.4–10.5)
Chloride: 105 mEq/L (ref 96–112)
Creatinine, Ser: 0.91 mg/dL (ref 0.40–1.20)
GFR: 77.5 mL/min (ref >60.00)
Glucose, Bld: 87 mg/dL (ref 70–99)
Potassium: 4.1 mEq/L (ref 3.5–5.1)
Sodium: 142 mEq/L (ref 135–145)

## 2017-01-21 LAB — TSH: TSH: 0.69 u[IU]/mL (ref 0.35–4.50)

## 2017-01-21 LAB — HEMOGLOBIN A1C: Hgb A1c MFr Bld: 6.3 % (ref 4.6–6.5)

## 2017-01-23 ENCOUNTER — Other Ambulatory Visit: Payer: Self-pay | Admitting: Emergency Medicine

## 2017-01-23 DIAGNOSIS — R202 Paresthesia of skin: Secondary | ICD-10-CM

## 2017-03-03 DIAGNOSIS — I10 Essential (primary) hypertension: Secondary | ICD-10-CM | POA: Diagnosis not present

## 2017-03-03 DIAGNOSIS — M545 Low back pain: Secondary | ICD-10-CM | POA: Diagnosis not present

## 2017-03-03 DIAGNOSIS — M48062 Spinal stenosis, lumbar region with neurogenic claudication: Secondary | ICD-10-CM | POA: Diagnosis not present

## 2017-03-03 DIAGNOSIS — M5416 Radiculopathy, lumbar region: Secondary | ICD-10-CM | POA: Diagnosis not present

## 2017-03-03 DIAGNOSIS — G5712 Meralgia paresthetica, left lower limb: Secondary | ICD-10-CM | POA: Diagnosis not present

## 2017-03-03 DIAGNOSIS — Z6834 Body mass index (BMI) 34.0-34.9, adult: Secondary | ICD-10-CM | POA: Diagnosis not present

## 2017-03-04 ENCOUNTER — Encounter: Payer: Self-pay | Admitting: Internal Medicine

## 2017-03-05 ENCOUNTER — Ambulatory Visit: Payer: Medicare Other

## 2017-03-05 DIAGNOSIS — M5416 Radiculopathy, lumbar region: Secondary | ICD-10-CM | POA: Diagnosis not present

## 2017-03-11 DIAGNOSIS — M5136 Other intervertebral disc degeneration, lumbar region: Secondary | ICD-10-CM | POA: Diagnosis not present

## 2017-03-11 DIAGNOSIS — C720 Malignant neoplasm of spinal cord: Secondary | ICD-10-CM | POA: Diagnosis not present

## 2017-03-11 DIAGNOSIS — M5126 Other intervertebral disc displacement, lumbar region: Secondary | ICD-10-CM | POA: Diagnosis not present

## 2017-03-17 DIAGNOSIS — M5416 Radiculopathy, lumbar region: Secondary | ICD-10-CM | POA: Diagnosis not present

## 2017-03-17 DIAGNOSIS — I10 Essential (primary) hypertension: Secondary | ICD-10-CM | POA: Diagnosis not present

## 2017-03-17 DIAGNOSIS — R030 Elevated blood-pressure reading, without diagnosis of hypertension: Secondary | ICD-10-CM | POA: Diagnosis not present

## 2017-03-17 DIAGNOSIS — M48062 Spinal stenosis, lumbar region with neurogenic claudication: Secondary | ICD-10-CM | POA: Diagnosis not present

## 2017-03-17 DIAGNOSIS — Z6834 Body mass index (BMI) 34.0-34.9, adult: Secondary | ICD-10-CM | POA: Diagnosis not present

## 2017-03-17 DIAGNOSIS — M545 Low back pain: Secondary | ICD-10-CM | POA: Diagnosis not present

## 2017-03-17 DIAGNOSIS — M1610 Unilateral primary osteoarthritis, unspecified hip: Secondary | ICD-10-CM | POA: Diagnosis not present

## 2017-03-18 NOTE — Progress Notes (Signed)
Chief Complaint  Patient presents with  . Annual Exam    HPI: [name] [name] 76 y.o. come in for Chronic disease management  Yearly exam .  Saw dr Vertell Limber and had mri   To do injections    Pinched nerves  In area of concern  Family temp living in .  Sleep effected .  Some down about 3-4 days.  bp m,eds  Taking reviewed  No falling  No cp sob new  ROS: See pertinent positives and negatives per HPI. No bleeding  Falling  hh of many fotemporarily   Past Medical History:  Diagnosis Date  . Abdominal pain     Neg ct [place] and mri of back   . Adenomatous colon polyp   . Allergy   . Anemia   . Anxiety   . Colon polyp   . Cough    ent evaluation  . DJD (degenerative joint disease) of lumbar spine   . Estrogen deficiency   . Gastritis   . GERD (gastroesophageal reflux disease)   . Headache(784.0)   . Helicobacter pylori infection 10/13/2007   Qualifier: Diagnosis of  By: Marland Mcalpine    . History of positive PPD 10/16/2011  . HSV-2 infection   . Hyperglycemia   . Hyperlipidemia   . Hypertension   . IBS (irritable bowel syndrome)   . Leg cramps 11/05/2013   Appears to have been from the higher dose of diuretic better on low dose and magnesium.   . Pulsatile tinnitus    had MRA and MPV nl mild carotid dopplers2010  . Shingles 02/09/2011   Right upper face and eyelid but not involving the eye at this point.   . Sleep apnea    does not use c pap  . Thyroid disease   . Vulvitis     Family History  Problem Relation Age of Onset  . Alzheimer's disease Mother   . Heart attack Father   . Asthma Sister   . Colon cancer Sister   . Heart attack Brother   . Colon polyps Sister   . Colon cancer Sister   . Colon cancer Paternal Aunt   . Thyroid disease Sister        multinodular goiter  . Stomach cancer Neg Hx     Social History   Social History  . Marital status: Single    Spouse name: widowed.   . Number of children: Y  . Years of education: N/A   Occupational  History  . retired.     [employer]----now retired.  .  Retired   Social History Main Topics  . Smoking status: Former Smoker    Packs/day: 0.50    Years: 23.00    Types: Cigarettes    Quit date: 06/16/1984  . Smokeless tobacco: Never Used  . Alcohol use No  . Drug use: No  . Sexual activity: Not Currently    Partners: Male    Birth control/ protection: Surgical   Other Topics Concern  . None   Social History Narrative   Retired    former smoker    widowed fall 2010 from myeloma. Lives alone.    [place] [address]  No pets   Taking care of  52 yo gc and now newborn in the fall     takign time off of child care     Outpatient Medications Prior to Visit  Medication Sig Dispense Refill  . acyclovir (ZOVIRAX) 400 MG tablet Take PO BID  PRN 60 tablet 12  . cyanocobalamin 1000 MCG tablet Take 1,000 mcg by mouth daily. Reported on 03/01/2016    . fluticasone (FLONASE) 50 MCG/ACT nasal spray INHALE 2 SPRAYS IN EACH NOSTRIL EVERY DAY FOR RHINITIS 16 g 5  . MAGNESIUM PO Take by mouth.    . meloxicam (MOBIC) 15 MG tablet Take 15 mg by mouth daily.    . naproxen (NAPROSYN) 250 MG tablet Take 1 tablet (250 mg total) by mouth 2 (two) times daily with a meal. 15 tablet 0  . SYNTHROID 100 MCG tablet TAKE 1 TABLET BY MOUTH EVERY DAY 90 tablet 2  . triamterene-hydrochlorothiazide (MAXZIDE-25) 37.5-25 MG tablet TAKE 1 TABLET BY MOUTH EVERY DAY 90 tablet 2  . fexofenadine (ALLEGRA) 60 MG tablet Take 1 tablet (60 mg total) by mouth 2 (two) times daily. 30 tablet 0   No facility-administered medications prior to visit.      EXAM:  BP 140/80 (BP Location: Right Arm, Patient Position: Sitting, Cuff Size: Normal)   Pulse (!) 59   Temp 98.4 F (36.9 C) (Oral)   Ht '5\' 5"'  (1.651 m)   Wt 209 lb 14.4 oz (95.2 kg)   BMI 34.93 kg/m   Body mass index is 34.93 kg/m. Physical Exam: Vital signs reviewed BUL:AGTX is a well-developed well-nourished alert cooperative  female who appears her stated age in no acute  distress.  HEENT: normocephalic atraumatic , Eyes: PERRL EOM's full, conjunctiva clear, Nares: paten,t no deformity discharge or tenderness., Ears: no deformity EAC's clear TMs with normal landmarks. Mouth: clear OP, no lesions, edema.  Moist mucous membranes. Dentition in adequate repair. NECK: supple without masses, thyroid  Palp no  bruits. CHEST/PULM:  Clear to auscultation and percussion breath sounds equal no wheeze , rales or rhonchi. No chest wall deformities or tenderness.Breast: normal by inspection . No dimpling, discharge, masses, tenderness or discharge . CV: PMI is nondisplaced, S1 S2 no gallops, murmurs, rubs. Peripheral pulses are full without delay.No JVD .  ABDOMEN: Bowel sounds normal nontender  No guard or rebound, no hepato splenomegal no CVA tenderness.  No hernia. Extremtities:  No clubbing cyanosis or edema, no acute joint swelling or redness no focal atrophy NEURO:  Oriented x3, cranial nerves 3-12 appear to be intact, no obvious focal weaknesss ensation not tested ,gait within normal limits  SKIN: No acute rashes normal turgor, color, no bruising or petechiae. PSYCH: Oriented, good eye contact, no obvious depression anxiety, cognition and judgment appear normal. LN: no cervical axillary i adenopathy      Lab Results  Component Value Date   WBC 7.5 01/20/2017   HGB 12.1 01/20/2017   HCT 37.9 01/20/2017   PLT 208.0 01/20/2017   GLUCOSE 87 01/20/2017   CHOL 202 (H) 03/19/2017   TRIG 105.0 03/19/2017   HDL 52.10 03/19/2017   LDLDIRECT 131.1 08/09/2009   LDLCALC 128 (H) 03/19/2017   ALT 14 03/19/2017   AST 22 03/19/2017   NA 142 01/20/2017   K 4.1 01/20/2017   CL 105 01/20/2017   CREATININE 0.91 01/20/2017   BUN 23 01/20/2017   CO2 28 01/20/2017   TSH 0.69 01/20/2017   INR 0.9 04/14/2007   HGBA1C 6.3 01/20/2017   BP Readings from Last 3 Encounters:  03/19/17 140/80  01/20/17 130/70  06/29/16 160/80   The 10-year ASCVD risk score [name], et al.,  2013) is: 17.1%   Values used to calculate the score:     Age: 70 years  Sex: Female     Is Non-Hispanic African American: Yes     Diabetic: No     Tobacco smoker: No     Systolic Blood Pressure: 627 mmHg     Is BP treated: Yes     HDL Cholesterol: 52.1 mg/dL     Total Cholesterol: 202 mg/dL  ASSESSMENT AND PLAN:  Discussed the following assessment and plan:  Hyperlipidemia, unspecified hyperlipidemia type - Plan: Lipid panel, Hepatic function panel  History of back surgery neurofibroma removal   Hyperglycemia - Plan: Lipid panel, Hepatic function panel  Multinodular goiter  Paresthesia of bilateral legs  Medication management - Plan: Lipid panel, Hepatic function panel  Essential hypertension - Plan: Lipid panel, Hepatic function panel After  Labs bac consider  Trying a different statin and if still has sx then ? Zetia?  -Patient advised to return or notify health care team  if  new concerns arise. Total visit 30mns > 50% spent counseling and coordinating care as indicated in above note and in instructions to patient .    Patient Instructions  Will notify you  of labs when available.   consideration of another  Cholesterol medication if still high   As a trial.   Make sure your b;lood pressure is below 140/90 Best is 120/80  Take blood pressure readings twice a day for5- 7- 10 days and then periodically .To ensure below 140/90   .Send in readings       Hope the injections are helpful  FU visit depending on labs and bp readings   .  6 months     Preventive Care 658Years and Older, Female Preventive care refers to lifestyle choices and visits with your health care provider that can promote health and wellness. What does preventive care include?  A yearly physical exam. This is also called an annual well check.  Dental exams once or twice a year.  Routine eye exams. Ask your health care provider how often you should have your eyes checked.  Personal  lifestyle choices, including: ? Daily care of your teeth and gums. ? Regular physical activity. ? Eating a healthy diet. ? Avoiding tobacco and drug use. ? Limiting alcohol use. ? Practicing safe sex. ? Taking low-dose aspirin every day. ? Taking vitamin and mineral supplements as recommended by your health care provider. What happens during an annual well check? The services and screenings done by your health care provider during your annual well check will depend on your age, overall health, lifestyle risk factors, and family history of disease. Counseling Your health care provider may ask you questions about your:  Alcohol use.  Tobacco use.  Drug use.  Emotional well-being.  Home and relationship well-being.  Sexual activity.  Eating habits.  History of falls.  Memory and ability to understand (cognition).  Work and work eStatistician  Reproductive health.  Screening You may have the following tests or measurements:  Height, weight, and BMI.  Blood pressure.  Lipid and cholesterol levels. These may be checked every 5 years, or more frequently if you are over 593years old.  Skin check.  Lung cancer screening. You may have this screening every year starting at age 6276if you have a 30-pack-year history of smoking and currently smoke or have quit within the past 15 years.  Fecal occult blood test (FOBT) of the stool. You may have this test every year starting at age 75  Flexible sigmoidoscopy or colonoscopy. You may have a sigmoidoscopy every  5 years or a colonoscopy every 10 years starting at age 13.  Hepatitis C blood test.  Hepatitis B blood test.  Sexually transmitted disease (STD) testing.  Diabetes screening. This is done by checking your blood sugar (glucose) after you have not eaten for a while (fasting). You may have this done every 1-3 years.  Bone density scan. This is done to screen for osteoporosis. You may have this done starting at age  31.  Mammogram. This may be done every 1-2 years. Talk to your health care provider about how often you should have regular mammograms.  Talk with your health care provider about your test results, treatment options, and if necessary, the need for more tests. Vaccines Your health care provider may recommend certain vaccines, such as:  Influenza vaccine. This is recommended every year.  Tetanus, diphtheria, and acellular pertussis (Tdap, Td) vaccine. You may need a Td booster every 10 years.  Varicella vaccine. You may need this if you have not been vaccinated.  Zoster vaccine. You may need this after age 33.  Measles, mumps, and rubella (MMR) vaccine. You may need at least one dose of MMR if you were born in 1957 or later. You may also need a second dose.  Pneumococcal 13-valent conjugate (PCV13) vaccine. One dose is recommended after age 28.  Pneumococcal polysaccharide (PPSV23) vaccine. One dose is recommended after age 54.  Meningococcal vaccine. You may need this if you have certain conditions.  Hepatitis A vaccine. You may need this if you have certain conditions or if you travel or work in places where you may be exposed to hepatitis A.  Hepatitis B vaccine. You may need this if you have certain conditions or if you travel or work in places where you may be exposed to hepatitis B.  Haemophilus influenzae type b (Hib) vaccine. You may need this if you have certain conditions.  Talk to your health care provider about which screenings and vaccines you need and how often you need them. This information is not intended to replace advice given to you by your health care provider. Make sure you discuss any questions you have with your health care provider. Document Released: 08/25/2015 Document Revised: 04/17/2016 Document Reviewed: 05/30/2015 Elsevier Interactive Patient Education  2017 [place]. Kyria Bumgardner M.D.

## 2017-03-19 ENCOUNTER — Ambulatory Visit (INDEPENDENT_AMBULATORY_CARE_PROVIDER_SITE_OTHER): Payer: Medicare Other | Admitting: Internal Medicine

## 2017-03-19 ENCOUNTER — Encounter: Payer: Self-pay | Admitting: Internal Medicine

## 2017-03-19 ENCOUNTER — Ambulatory Visit: Payer: Medicare Other | Admitting: Internal Medicine

## 2017-03-19 VITALS — BP 140/80 | HR 59 | Temp 98.4°F | Ht 65.0 in | Wt 209.9 lb

## 2017-03-19 DIAGNOSIS — Z9889 Other specified postprocedural states: Secondary | ICD-10-CM | POA: Diagnosis not present

## 2017-03-19 DIAGNOSIS — E785 Hyperlipidemia, unspecified: Secondary | ICD-10-CM

## 2017-03-19 DIAGNOSIS — R202 Paresthesia of skin: Secondary | ICD-10-CM

## 2017-03-19 DIAGNOSIS — E042 Nontoxic multinodular goiter: Secondary | ICD-10-CM

## 2017-03-19 DIAGNOSIS — R739 Hyperglycemia, unspecified: Secondary | ICD-10-CM | POA: Diagnosis not present

## 2017-03-19 DIAGNOSIS — Z79899 Other long term (current) drug therapy: Secondary | ICD-10-CM | POA: Diagnosis not present

## 2017-03-19 DIAGNOSIS — I10 Essential (primary) hypertension: Secondary | ICD-10-CM | POA: Diagnosis not present

## 2017-03-19 LAB — LIPID PANEL
Cholesterol: 202 mg/dL — ABNORMAL HIGH (ref 0–200)
HDL: 52.1 mg/dL (ref >39.00)
LDL Cholesterol: 128 mg/dL — ABNORMAL HIGH (ref 0–99)
NonHDL: 149.41
Total CHOL/HDL Ratio: 4
Triglycerides: 105 mg/dL (ref 0.0–149.0)
VLDL: 21 mg/dL (ref 0.0–40.0)

## 2017-03-19 LAB — HEPATIC FUNCTION PANEL
ALT: 14 U/L (ref 0–35)
AST: 22 U/L (ref 0–37)
Albumin: 4.1 g/dL (ref 3.5–5.2)
Alkaline Phosphatase: 56 U/L (ref 39–117)
Bilirubin, Direct: 0.1 mg/dL (ref 0.0–0.3)
Total Bilirubin: 0.5 mg/dL (ref 0.2–1.2)
Total Protein: 6.7 g/dL (ref 6.0–8.3)

## 2017-03-19 NOTE — Patient Instructions (Addendum)
Will notify you  of labs when available.   consideration of another  Cholesterol medication if still high   As a trial.   Make sure your b;lood pressure is below 140/90 Best is 120/80  Take blood pressure readings twice a day for5- 7- 10 days and then periodically .To ensure below 140/90   .Send in readings       Hope the injections are helpful  FU visit depending on labs and bp readings   .  6 months     Preventive Care 75 Years and Older, Female Preventive care refers to lifestyle choices and visits with your health care provider that can promote health and wellness. What does preventive care include?  A yearly physical exam. This is also called an annual well check.  Dental exams once or twice a year.  Routine eye exams. Ask your health care provider how often you should have your eyes checked.  Personal lifestyle choices, including: ? Daily care of your teeth and gums. ? Regular physical activity. ? Eating a healthy diet. ? Avoiding tobacco and drug use. ? Limiting alcohol use. ? Practicing safe sex. ? Taking low-dose aspirin every day. ? Taking vitamin and mineral supplements as recommended by your health care provider. What happens during an annual well check? The services and screenings done by your health care provider during your annual well check will depend on your age, overall health, lifestyle risk factors, and family history of disease. Counseling Your health care provider may ask you questions about your:  Alcohol use.  Tobacco use.  Drug use.  Emotional well-being.  Home and relationship well-being.  Sexual activity.  Eating habits.  History of falls.  Memory and ability to understand (cognition).  Work and work Statistician.  Reproductive health.  Screening You may have the following tests or measurements:  Height, weight, and BMI.  Blood pressure.  Lipid and cholesterol levels. These may be checked every 5 years, or more frequently  if you are over 33 years old.  Skin check.  Lung cancer screening. You may have this screening every year starting at age 75 if you have a 30-pack-year history of smoking and currently smoke or have quit within the past 15 years.  Fecal occult blood test (FOBT) of the stool. You may have this test every year starting at age 75.  Flexible sigmoidoscopy or colonoscopy. You may have a sigmoidoscopy every 5 years or a colonoscopy every 10 years starting at age 75.  Hepatitis C blood test.  Hepatitis B blood test.  Sexually transmitted disease (STD) testing.  Diabetes screening. This is done by checking your blood sugar (glucose) after you have not eaten for a while (fasting). You may have this done every 1-3 years.  Bone density scan. This is done to screen for osteoporosis. You may have this done starting at age 62.  Mammogram. This may be done every 1-2 years. Talk to your health care provider about how often you should have regular mammograms.  Talk with your health care provider about your test results, treatment options, and if necessary, the need for more tests. Vaccines Your health care provider may recommend certain vaccines, such as:  Influenza vaccine. This is recommended every year.  Tetanus, diphtheria, and acellular pertussis (Tdap, Td) vaccine. You may need a Td booster every 10 years.  Varicella vaccine. You may need this if you have not been vaccinated.  Zoster vaccine. You may need this after age 10.  Measles, mumps, and rubella (  MMR) vaccine. You may need at least one dose of MMR if you were born in 1957 or later. You may also need a second dose.  Pneumococcal 13-valent conjugate (PCV13) vaccine. One dose is recommended after age 19.  Pneumococcal polysaccharide (PPSV23) vaccine. One dose is recommended after age 67.  Meningococcal vaccine. You may need this if you have certain conditions.  Hepatitis A vaccine. You may need this if you have certain conditions  or if you travel or work in places where you may be exposed to hepatitis A.  Hepatitis B vaccine. You may need this if you have certain conditions or if you travel or work in places where you may be exposed to hepatitis B.  Haemophilus influenzae type b (Hib) vaccine. You may need this if you have certain conditions.  Talk to your health care provider about which screenings and vaccines you need and how often you need them. This information is not intended to replace advice given to you by your health care provider. Make sure you discuss any questions you have with your health care provider. Document Released: 08/25/2015 Document Revised: 04/17/2016 Document Reviewed: 05/30/2015 Elsevier Interactive Patient Education  2017 [employer].

## 2017-04-01 NOTE — Progress Notes (Addendum)
Subjective:   [name] [name] is a 76 y.o. female who presents for Medicare Annual (Subsequent) preventive examination.  The Patient was informed that the wellness visit is to identify future health risk and educate and initiate measures that can reduce risk for increased disease through the lifespan.    Annual Wellness Assessment  Reports health as   Preventive Screening -Counseling & Management  Medicar/e A/nnual Preventive Care Visit - Subsequent Last OV 03/19/2017 Per Dr. Regis Bill is noted, family members moved in with the patient and sleep is been affected Does childcare.  Colonoscopy was completed February 2016 Mammogram was completed September 2017 DEXA was completed November 2013. The lowest score was -0.2  Describes Health as poor, fair, good or great? Good but having some pain issues In hip and back   Smoking history with 23 pack years, quit November 1995  VS reviewed; the patient per Dr. Regis Bill was to check her blood pressure for a couple of weeks to be sure her readings were under 140/80.  Diet  States she has lost weight; lost 5 since seeing Dr. Regis Bill Cut back on sugary drinks, sone challenged her with no soda or sweets, just water BP cuff was not working but will purchase one  States she has been in pain- awaiting an injection for back pain; Dr. 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 Hurt big toe     Sleep patterns: sometimes; wakes up around 2 Around 3 and 5; uses a CD to play a little music which helps Discussed go to bed at the same time  Pain  - may need hip replacement if injections do not work  Has a pinched nerve in back and working to correct this    Hearing Screening Comments: Has not had  a hearing test recently  Does not feel she needs one now Vision Screening Comments: Dr. Herbert Deaner; vision checks Had cataracts removed in 2013;  Does have reading glasses  Cardiac Risk Factors Addressed Hyperlipidemia-cholesterol HDL ratio was 4, cholesterol 202, HDL 52, LDL 128  Pre-diabetes appears to be prediabetic; as fastings are within normal range but A1c trending up from 5.9-6.3   Obesity 33 Discussed weight loss and she is on her way   Advanced Directives - no Advanced Directive; Reviewed advanced directive and agreed to receipt of information and discussion.  Focused face to face x  20 minutes discussing HCPOA and Living will and reviewed all the questions in the [place] forms. The patient voices understanding of HCPOA; LW reviewed and information provided on each question. Educated on how to revoke this HCPOA or LW at any time.   Also  discussed life prolonging measures (given a few examples) and where she could choose to initiate or not;  the ability to given the HCPOA power to change her living will or not if he cannot speak for herself; as well as finalizing the will by 2 unrelated witnesses and notary.  Will call for questions and given information on [facility] pastoral department for further assistance.    Depression; Know she has to get past this when it occurs Triggers; does not know why States it lifts in a  couple days Started a journal    Patient Care Team: Panosh, Standley Brooking, MD as PCP - General Jerrell Belfast, MD Eldred Manges, MD (Obstetrics and Gynecology) Gatha Mayer, MD (Gastroenterology) Justice Britain, MD as Consulting Physician (Orthopedic Surgery) Monna Fam, MD as Consulting Physician (Ophthalmology) Assessed for additional providers  Immunization History  Administered Date(s) Administered  . Influenza Split 06/20/2011, 06/23/2012  . Influenza Whole 05/17/2008, 06/11/2010  . Influenza, High Dose Seasonal PF 05/18/2015, 09/05/2016    . Influenza,inj,Quad PF,6+ Mos 04/08/2013, 05/25/2014  . Pneumococcal Conjugate-13 11/05/2013  . Pneumococcal Polysaccharide-23 02/05/2008  . Td 08/12/2004  . Tdap 06/23/2012  . Zoster 03/28/2011   Required Immunizations needed today  Screening test up to date or reviewed for plan of completion Health Maintenance Due  Topic Date Due  . INFLUENZA VACCINE  03/12/2017     Cardiac Risk Factors include: advanced age (>12men, >69 women);family history of premature cardiovascular disease;hypertension;obesity (BMI >30kg/m2)     Objective:     Vitals: BP 136/76   Pulse 95   Ht 5\' 5"  (1.651 m)   Wt 204 lb (92.5 kg)   SpO2 (!) 67%   BMI 33.95 kg/m   Body mass index is 33.95 kg/m.   Tobacco History  Smoking Status  . Former Smoker  . Packs/day: 0.50  . Years: 23.00  . Types: Cigarettes  . Quit date: 06/16/1984  Smokeless Tobacco  . Never Used     Counseling given: Yes   Past Medical History:  Diagnosis Date  . Abdominal pain     Neg ct [place] and mri of back   . Adenomatous colon polyp   . Allergy   . Anemia   . Anxiety   . Colon polyp   . Cough    ent evaluation  . DJD (degenerative joint disease) of lumbar spine   . Estrogen deficiency   . Gastritis   . GERD (gastroesophageal reflux disease)   . Headache(784.0)   . Helicobacter pylori infection 10/13/2007   Qualifier: Diagnosis of  By: Marland Mcalpine    . History of positive PPD 10/16/2011  . HSV-2 infection   . Hyperglycemia   . Hyperlipidemia   . Hypertension   . IBS (irritable bowel syndrome)   . Leg cramps 11/05/2013   Appears to have been from the higher dose of diuretic better on low dose and magnesium.   . Pulsatile tinnitus    had MRA and MPV nl mild carotid dopplers2010  . Shingles 02/09/2011   Right upper face and eyelid but not involving the eye at this point.   . Sleep apnea    does not use c pap  . Thyroid disease   . Vulvitis    Past Surgical History:  Procedure Laterality Date  .  ABDOMINAL HYSTERECTOMY  1982   fibroids  . COLONOSCOPY  2003- 2102   adenomatous polyps  . FINGER SURGERY     left middle A1pulley release  . isthmuectomy     thyroid surgery  . LUMBAR SPINE SURGERY  05/1989   spine d/t tumor neurofibroma Quentin Cornwall  . SHOULDER ARTHROSCOPY     left  . THYROIDECTOMY     partial rt  ballen  . TONSILLECTOMY     at age 58.   . TUBAL LIGATION  1974   Family History  Problem Relation Age of Onset  . Alzheimer's disease Mother   . Heart attack Father   . Asthma Sister   . Colon cancer Sister   .  Heart attack Brother   . Colon polyps Sister   . Colon cancer Sister   . Colon cancer Paternal Aunt   . Thyroid disease Sister        multinodular goiter  . Stomach cancer Neg Hx    History  Sexual Activity  . Sexual activity: Not Currently  . Partners: Male  . Birth control/ protection: Surgical    Outpatient Encounter Prescriptions as of 04/02/2017  Medication Sig  . acyclovir (ZOVIRAX) 400 MG tablet Take PO BID PRN  . cyanocobalamin 1000 MCG tablet Take 1,000 mcg by mouth daily. Reported on 03/01/2016  . fluticasone (FLONASE) 50 MCG/ACT nasal spray INHALE 2 SPRAYS IN EACH NOSTRIL EVERY DAY FOR RHINITIS  . MAGNESIUM PO Take by mouth.  . meloxicam (MOBIC) 15 MG tablet Take 15 mg by mouth daily.  . naproxen (NAPROSYN) 250 MG tablet Take 1 tablet (250 mg total) by mouth 2 (two) times daily with a meal.  . SYNTHROID 100 MCG tablet TAKE 1 TABLET BY MOUTH EVERY DAY  . triamterene-hydrochlorothiazide (MAXZIDE-25) 37.5-25 MG tablet TAKE 1 TABLET BY MOUTH EVERY DAY   No facility-administered encounter medications on file as of 04/02/2017.     Activities of Daily Living In your present state of health, do you have any difficulty performing the following activities: 04/02/2017  Hearing? N  Vision? N  Difficulty concentrating or making decisions? N  Walking or climbing stairs? N  Dressing or bathing? N  Doing errands, shopping? N  Preparing Food and  eating ? N  Using the Toilet? N  In the past six months, have you accidently leaked urine? N  Do you have problems with loss of bowel control? N  Managing your Medications? N  Managing your Finances? N  Housekeeping or managing your Housekeeping? N  Some recent data might be hidden    Patient Care Team: Panosh, Standley Brooking, MD as PCP - General Jerrell Belfast, MD Eldred Manges, MD (Obstetrics and Gynecology) Gatha Mayer, MD (Gastroenterology) Justice Britain, MD as Consulting Physician (Orthopedic Surgery) Monna Fam, MD as Consulting Physician (Ophthalmology)    Assessment:     Exercise Activities and Dietary recommendations Current Exercise Habits: Home exercise routine  Goals    . patient          To eat healthier and [facility]     . [facility]          Set your realistic goals in the am; chip away at them. Review at hs/  Goal setting without judgement;  No "should have"   Lower expectations      Fall Risk Fall Risk  04/02/2017 03/19/2017 03/01/2016 01/25/2015 11/05/2013  Falls in the past year? Yes No No No No  Number falls in past yr: 1 - - - -  Follow up Education provided - - - -   Depression Screen PHQ 2/9 Scores 04/02/2017 03/19/2017 01/25/2015 11/05/2013  PHQ - 2 Score 1 0 0 0  Exception Documentation Patient refusal - - -     Cognitive Function        Immunization History  Administered Date(s) Administered  . Influenza Split 06/20/2011, 06/23/2012  . Influenza Whole 05/17/2008, 06/11/2010  . Influenza, High Dose Seasonal PF 05/18/2015, 09/05/2016  . Influenza,inj,Quad PF,6+ Mos 04/08/2013, 05/25/2014  . Pneumococcal Conjugate-13 11/05/2013  . Pneumococcal Polysaccharide-23 02/05/2008  . Td 08/12/2004  . Tdap 06/23/2012  . Zoster 03/28/2011   Screening Tests Health Maintenance  Topic  Date Due  . INFLUENZA VACCINE  03/12/2017  . COLONOSCOPY  09/15/2017  . TETANUS/TDAP  06/23/2022  . DEXA SCAN  Completed  .  PNA vac Low Risk Adult  Completed      Plan:      PCP Notes   Health Maintenance Mammogram planned for the fall Educated regarding advanced Directive   Abnormal Screens  A1c 6.3 and educated regarding pre diabetes and weight loss Exercise difficult due to pain in right hip and back pain;  To get injection to hip and back soon  Referrals  none  Patient concerns; Discussed mood, sleep and weight and she set goals for each  Periods of short term depression resolved in 2 days; most likely due to becoming to tired and "perfectionistic" etc   Nurse Concerns; As noted   Next PCP apt Not planned, but advised to show up in 6 monthjs for blood work; a1c and discussion of lipid; the patient is attempting weight loss  Will check BP 2 times a week at [facility] and will get a cuff when feasible      I have personally reviewed and noted the following in the patient's chart:   . Medical and social history . Use of alcohol, tobacco or illicit drugs  . Current medications and supplements . Functional ability and status . Nutritional status . Physical activity . Advanced directives . List of other physicians . Hospitalizations, surgeries, and ER visits in previous 12 months . Vitals . Screenings to include cognitive, depression, and falls . Referrals and appointments  In addition, I have reviewed and discussed with patient certain preventive protocols, quality metrics, and best practice recommendations. A written personalized care plan for preventive services as well as general preventive health recommendations were provided to patient.     QJJHE,RDEYC, RN  04/02/2017  Above noted reviewed and agree. Lottie Dawson, MD

## 2017-04-02 ENCOUNTER — Ambulatory Visit (INDEPENDENT_AMBULATORY_CARE_PROVIDER_SITE_OTHER): Payer: Medicare Other

## 2017-04-02 VITALS — BP 136/76 | HR 95 | Ht 65.0 in | Wt 204.0 lb

## 2017-04-02 DIAGNOSIS — Z0000 Encounter for general adult medical examination without abnormal findings: Secondary | ICD-10-CM | POA: Diagnosis not present

## 2017-04-02 NOTE — Patient Instructions (Addendum)
[name] [name] , Thank you for taking time to come for your Medicare Wellness Visit. I appreciate your ongoing commitment to your health goals. Please review the following plan we discussed and let me know if I can assist you in the future.   Will try to get a bp cuff when feasible but will check on BP when visiting local pharmacy etc  Will try to complete AD; Given copy  Referred to [facility] for questions [name] offers free advance directive forms, as well as assistance in completing the forms themselves. For assistance, contact the Spiritual Care Department at [phone], or the Clinical Social Work Department at [phone]. Talk to your children about your wishes   Find time to rest during the day  Will take hour flu vaccine when available Keep in mind the flu shot is an inactivated vaccine and takes at least 2 weeks to build immunity. The flu virus can be dormant for 4 days prior to symptoms Taking the flu shot at the beginning of the season can reduce the risk for the entire community.        These are the goals we discussed: Goals    . patient          To eat healthier and [facility]     . patient          Set your realistic goals in the am; chip away at them. Review at hs/  Goal setting without judgement;  No "should have"   Lower expectations  Take time to rest       This is a list of the screening recommended for you and due dates:  Health Maintenance  Topic Date Due  . Flu Shot  03/12/2017  . Colon Cancer Screening  09/15/2017  . Tetanus Vaccine  06/23/2022  . DEXA scan (bone density measurement)  Completed  . Pneumonia vaccines  Completed     DASH Eating Plan DASH stands for "Dietary Approaches to Stop Hypertension." The DASH eating plan is a healthy eating plan that has been shown to reduce high blood pressure (hypertension). It may also reduce your risk for type 2 diabetes, heart disease, and stroke. The DASH eating  plan may also help with weight loss. What are tips for following this plan? General guidelines  Avoid eating more than 2,300 mg (milligrams) of salt (sodium) a day. If you have hypertension, you may need to reduce your sodium intake to 1,500 mg a day.  Limit alcohol intake to no more than 1 drink a day for nonpregnant women and 2 drinks a day for men. One drink equals 12 oz of beer, 5 oz of wine, or 1 oz of hard liquor.  Work with your health care provider to maintain a healthy body weight or to lose weight. Ask what an ideal weight is for you.  Get at least 30 minutes of exercise that causes your heart to beat faster (aerobic exercise) most days of the week. Activities may include walking, swimming, or biking.  Work with your health care provider or diet and nutrition specialist (dietitian) to adjust your eating plan to your individual calorie needs. Reading food labels  Check food labels for the amount of sodium per serving. Choose foods with less than 5 percent of the Daily Value of sodium. Generally, foods with less than 300 mg of sodium per serving fit into this eating plan.  To find whole grains, look for the word "whole" as  the first word in the ingredient list. Shopping  Buy products labeled as "low-sodium" or "no salt added."  Buy fresh foods. Avoid canned foods and premade or frozen meals. Cooking  Avoid adding salt when cooking. Use salt-free seasonings or herbs instead of table salt or sea salt. Check with your health care provider or pharmacist before using salt substitutes.  Do not fry foods. Cook foods using healthy methods such as baking, boiling, grilling, and broiling instead.  Cook with heart-healthy oils, such as olive, canola, soybean, or sunflower oil. Meal planning   Eat a balanced diet that includes: ? 5 or more servings of fruits and vegetables each day. At each meal, try to fill half of your plate with fruits and vegetables. ? Up to 6-8 servings of whole  grains each day. ? Less than 6 oz of lean meat, poultry, or fish each day. A 3-oz serving of meat is about the same size as a deck of cards. One egg equals 1 oz. ? 2 servings of low-fat dairy each day. ? A serving of nuts, seeds, or beans 5 times each week. ? Heart-healthy fats. Healthy fats called Omega-3 fatty acids are found in foods such as flaxseeds and coldwater fish, like sardines, salmon, and mackerel.  Limit how much you eat of the following: ? Canned or prepackaged foods. ? Food that is high in trans fat, such as fried foods. ? Food that is high in saturated fat, such as fatty meat. ? Sweets, desserts, sugary drinks, and other foods with added sugar. ? Full-fat dairy products.  Do not salt foods before eating.  Try to eat at least 2 vegetarian meals each week.  Eat more home-cooked food and less restaurant, buffet, and fast food.  When eating at a restaurant, ask that your food be prepared with less salt or no salt, if possible. What foods are recommended? The items listed may not be a complete list. Talk with your dietitian about what dietary choices are best for you. Grains Whole-grain or whole-wheat bread. Whole-grain or whole-wheat pasta. Brown rice. [place]. Bulgur. Whole-grain and low-sodium cereals. Pita bread. Low-fat, low-sodium crackers. Whole-wheat flour tortillas. Vegetables Fresh or frozen vegetables (raw, steamed, roasted, or grilled). Low-sodium or reduced-sodium tomato and vegetable juice. Low-sodium or reduced-sodium tomato sauce and tomato paste. Low-sodium or reduced-sodium canned vegetables. Fruits All fresh, dried, or frozen fruit. Canned fruit in natural juice (without added sugar). Meat and other protein foods Skinless chicken or [place]. Ground chicken or [place]. Pork with fat trimmed off. Fish and seafood. Egg whites. Dried beans, peas, or lentils. Unsalted nuts, nut butters, and seeds. Unsalted canned beans. Lean cuts of beef with fat trimmed  off. Low-sodium, lean deli meat. Dairy Low-fat (1%) or fat-free (skim) milk. Fat-free, low-fat, or reduced-fat cheeses. Nonfat, low-sodium ricotta or cottage cheese. Low-fat or nonfat yogurt. Low-fat, low-sodium cheese. Fats and oils Soft margarine without trans fats. Vegetable oil. Low-fat, reduced-fat, or light mayonnaise and salad dressings (reduced-sodium). Canola, safflower, olive, soybean, and sunflower oils. Avocado. Seasoning and other foods Herbs. Spices. Seasoning mixes without salt. Unsalted popcorn and pretzels. Fat-free sweets. What foods are not recommended? The items listed may not be a complete list. Talk with your dietitian about what dietary choices are best for you. Grains Baked goods made with fat, such as croissants, muffins, or some breads. Dry pasta or rice meal packs. Vegetables Creamed or fried vegetables. Vegetables in a cheese sauce. Regular canned vegetables (not low-sodium or reduced-sodium). Regular canned tomato sauce and paste (  not low-sodium or reduced-sodium). Regular tomato and vegetable juice (not low-sodium or reduced-sodium). Angie Fava. Olives. Fruits Canned fruit in a light or heavy syrup. Fried fruit. Fruit in cream or butter sauce. Meat and other protein foods Fatty cuts of meat. Ribs. Fried meat. Berniece Salines. Sausage. Bologna and other processed lunch meats. Salami. Fatback. Hotdogs. Bratwurst. Salted nuts and seeds. Canned beans with added salt. Canned or smoked fish. Whole eggs or egg yolks. Chicken or [place] with skin. Dairy Whole or 2% milk, cream, and half-and-half. Whole or full-fat cream cheese. Whole-fat or sweetened yogurt. Full-fat cheese. Nondairy creamers. Whipped toppings. Processed cheese and cheese spreads. Fats and oils Butter. Stick margarine. Lard. Shortening. Ghee. Bacon fat. Tropical oils, such as coconut, palm kernel, or palm oil. Seasoning and other foods Salted popcorn and pretzels. Onion salt, garlic salt, seasoned salt, table salt, and  sea salt. Worcestershire sauce. Tartar sauce. Barbecue sauce. Teriyaki sauce. Soy sauce, including reduced-sodium. Steak sauce. Canned and packaged gravies. Fish sauce. Oyster sauce. Cocktail sauce. Horseradish that you find on the shelf. Ketchup. Mustard. Meat flavorings and tenderizers. Bouillon cubes. Hot sauce and Tabasco sauce. Premade or packaged marinades. Premade or packaged taco seasonings. Relishes. Regular salad dressings. Where to find more information:  National Heart, Lung, and [place]: https://wilson-eaton.com/  American Heart Association: www.heart.org Summary  The DASH eating plan is a healthy eating plan that has been shown to reduce high blood pressure (hypertension). It may also reduce your risk for type 2 diabetes, heart disease, and stroke.  With the DASH eating plan, you should limit salt (sodium) intake to 2,300 mg a day. If you have hypertension, you may need to reduce your sodium intake to 1,500 mg a day.  When on the DASH eating plan, aim to eat more fresh fruits and vegetables, whole grains, lean proteins, low-fat dairy, and heart-healthy fats.  Work with your health care provider or diet and nutrition specialist (dietitian) to adjust your eating plan to your individual calorie needs. This information is not intended to replace advice given to you by your health care provider. Make sure you discuss any questions you have with your health care provider. Document Released: 07/18/2011 Document Revised: 07/22/2016 Document Reviewed: 07/22/2016 Elsevier Interactive Patient Education  2017 [place] regarding prediabetes and numbers;  A1c ranges from 5.8 to 6.5 or fasting Blood sugar > 115 -126; (126 is diabetic)   Risk: >45yo; family hx; overweight or obese; African American; Hispanic; Latino; American [place] American; [place]; history of diabetes when pregnant; or birth to a baby weighing over 9 lbs. Being less physically active than 30  minutes; 3 times a week;   Prevention; Losing a modest 7 to 8 lbs; If over 200 lbs; 10 to 14 lbs;  Choose healthier foods; colorful veggies; fish or lean meats; drinks water Reduce portion size Start exercising; 30 minutes of fast walking x 30 minutes per day/ 60 min for weight loss    It is normal to wake up several during the night, but we should drift back to sleep    Insomnia Insomnia is a sleep disorder that makes it difficult to fall asleep or to stay asleep. Insomnia can cause tiredness (fatigue), low energy, difficulty concentrating, mood swings, and poor performance at work or school. There are three different ways to classify insomnia:  Difficulty falling asleep.  Difficulty staying asleep.  Waking up too early in the morning.  Any type of insomnia can be long-term (chronic) or short-term (acute). Both are  common. Short-term insomnia usually lasts for three months or less. Chronic insomnia occurs at least three times a week for longer than three months. What are the causes? Insomnia may be caused by another condition, situation, or substance, such as:  Anxiety.  Certain medicines.  Gastroesophageal reflux disease (GERD) or other gastrointestinal conditions.  Asthma or other breathing conditions.  Restless legs syndrome, sleep apnea, or other sleep disorders.  Chronic pain.  Menopause. This may include hot flashes.  Stroke.  Abuse of alcohol, tobacco, or illegal drugs.  Depression.  Caffeine.  Neurological disorders, such as Alzheimer disease.  An overactive thyroid (hyperthyroidism).  The cause of insomnia may not be known. What increases the risk? Risk factors for insomnia include:  Gender. Women are more commonly affected than men.  Age. Insomnia is more common as you get older.  Stress. This may involve your professional or personal life.  Income. Insomnia is more common in people with lower income.  Lack of exercise.  Irregular work  schedule or night shifts.  Traveling between different time zones.  What are the signs or symptoms? If you have insomnia, trouble falling asleep or trouble staying asleep is the main symptom. This may lead to other symptoms, such as:  Feeling fatigued.  Feeling nervous about going to sleep.  Not feeling rested in the morning.  Having trouble concentrating.  Feeling irritable, anxious, or depressed.  How is this treated? Treatment for insomnia depends on the cause. If your insomnia is caused by an underlying condition, treatment will focus on addressing the condition. Treatment may also include:  Medicines to help you sleep.  Counseling or therapy.  Lifestyle adjustments.  Follow these instructions at home:  Take medicines only as directed by your health care provider.  Keep regular sleeping and waking hours. Avoid naps.  Keep a sleep diary to help you and your health care provider figure out what could be causing your insomnia. Include: ? When you sleep. ? When you wake up during the night. ? How well you sleep. ? How rested you feel the next day. ? Any side effects of medicines you are taking. ? What you eat and drink.  Make your bedroom a comfortable place where it is easy to fall asleep: ? Put up shades or special blackout curtains to block light from outside. ? Use a white noise machine to block noise. ? Keep the temperature cool.  Exercise regularly as directed by your health care provider. Avoid exercising right before bedtime.  Use relaxation techniques to manage stress. Ask your health care provider to suggest some techniques that may work well for you. These may include: ? Breathing exercises. ? Routines to release muscle tension. ? Visualizing peaceful scenes.  Cut back on alcohol, caffeinated beverages, and cigarettes, especially close to bedtime. These can disrupt your sleep.  Do not overeat or eat spicy foods right before bedtime. This can lead to  digestive discomfort that can make it hard for you to sleep.  Limit screen use before bedtime. This includes: ? Watching TV. ? Using your smartphone, tablet, and computer.  Stick to a routine. This can help you fall asleep faster. Try to do a quiet activity, brush your teeth, and go to bed at the same time each night.  Get out of bed if you are still awake after 15 minutes of trying to sleep. Keep the lights down, but try reading or doing a quiet activity. When you feel sleepy, go back to bed.  Make sure  that you drive carefully. Avoid driving if you feel very sleepy.  Keep all follow-up appointments as directed by your health care provider. This is important. Contact a health care provider if:  You are tired throughout the day or have trouble in your daily routine due to sleepiness.  You continue to have sleep problems or your sleep problems get worse. Get help right away if:  You have serious thoughts about hurting yourself or someone else. This information is not intended to replace advice given to you by your health care provider. Make sure you discuss any questions you have with your health care provider. Document Released: 07/26/2000 Document Revised: 12/29/2015 Document Reviewed: 04/29/2014 Elsevier Interactive Patient Education  2018 [employer].   Summary: Preventive Care for Adults  A healthy lifestyle and preventive care can promote health and wellness. Preventive health guidelines for adults include the following key practices.  . A routine yearly physical is a good way to check with your health care provider about your health and preventive screening. It is a chance to share any concerns and updates on your health and to receive a thorough exam.  . Visit your dentist for a routine exam and preventive care every 6 months. Brush your teeth twice a day and floss once a day. Good oral hygiene prevents tooth decay and gum disease.  . The frequency of eye exams is based  on your age, health, family medical history, use  of contact lenses, and other factors. Follow your health care provider's ecommendations for frequency of eye exams.  . Eat a healthy diet. Foods like vegetables, fruits, whole grains, low-fat dairy products, and lean protein foods contain the nutrients you need without too many calories. Decrease your intake of foods high in solid fats, added sugars, and salt. Eat the right amount of calories for you. Get information about a proper diet from your health care provider, if necessary.  . Regular physical exercise is one of the most important things you can do for your health. Most adults should get at least 150 minutes of moderate-intensity exercise (any activity that increases your heart rate and causes you to sweat) each week. In addition, most adults need muscle-strengthening exercises on 2 or more days a week.  Silver Sneakers may be a benefit available to you. To determine eligibility, you may visit the website: www.silversneakers.com or contact program at [phone] Mon-Fri between 8AM-8PM.   . Maintain a healthy weight. The body mass index (BMI) is a screening tool to identify possible weight problems. It provides an estimate of body fat based on height and weight. Your health care provider can find your BMI and can help you achieve or maintain a healthy weight.   For adults 20 years and older: ? A BMI below 18.5 is considered underweight. ? A BMI of 18.5 to 24.9 is normal. ? A BMI of 27 to 28 is considered normal by the Institutes of Health  ? A BMI of 30 and above is considered obese.   . Maintain normal blood lipids and cholesterol levels by exercising and minimizing your intake of saturated fat. Eat a balanced diet with plenty of fruit and vegetables. Blood tests for lipids and cholesterol should begin at age 51 and be repeated every 5 years. If your lipid or cholesterol levels are high, you are over 50, or you are at high risk  for heart disease, you may need your cholesterol levels checked more frequently. Ongoing high lipid and cholesterol levels should be treated  with medicines if diet and exercise are not working.  . If you smoke, find out from your health care provider how to quit. If you do not use tobacco, please do not start.  . If you choose to drink alcohol, please do not consume more than one drink for women and 2 for men.  One drink is considered to be 12 ounces (355 mL) of beer, 5 ounces (148 mL) of wine, or 1.5 ounces (44 mL) of liquor. Moderation of alcohol intake to this level decreases your risk of breast cancer and liver damage.   . If you are 64-73 years old, ask your health care provider if you should take aspirin to prevent strokes.  . Use sunscreen. Apply sunscreen liberally and repeatedly throughout the day. You should seek shade when your shadow is shorter than you. Protect yourself by wearing long sleeves, pants, a wide-brimmed hat, and sunglasses year round, whenever you are outdoors.  . Once a month, do a whole body skin exam, using a mirror to look at the skin on your back. Tell your health care provider of new moles, moles that have irregular borders, moles that are larger than a pencil eraser, or moles that have changed in shape or color.  Last, if you have completed an Advanced Directive; please bring a copy and review with your physician and then we will scan to the medical record

## 2017-04-11 ENCOUNTER — Other Ambulatory Visit: Payer: Self-pay | Admitting: Internal Medicine

## 2017-04-17 DIAGNOSIS — M1610 Unilateral primary osteoarthritis, unspecified hip: Secondary | ICD-10-CM | POA: Diagnosis not present

## 2017-04-17 DIAGNOSIS — M545 Low back pain: Secondary | ICD-10-CM | POA: Diagnosis not present

## 2017-05-02 ENCOUNTER — Encounter: Payer: Self-pay | Admitting: Internal Medicine

## 2017-05-09 ENCOUNTER — Other Ambulatory Visit: Payer: Self-pay | Admitting: Internal Medicine

## 2017-05-09 DIAGNOSIS — Z1231 Encounter for screening mammogram for malignant neoplasm of breast: Secondary | ICD-10-CM

## 2017-05-13 ENCOUNTER — Telehealth: Payer: Self-pay | Admitting: *Deleted

## 2017-05-13 NOTE — Telephone Encounter (Signed)
Patient called requesting to talk with the nurse regarding her wellness visit. Please call patient [phone]

## 2017-05-13 NOTE — Telephone Encounter (Signed)
Spoke with pt and informed her I do not see where she is due to see Dr. Regis Bill sooner she last saw her in Aug of 2018 and she stated to come back in Feb and she has already seen the wellness nurse in 03/2017 as well. Told pt to call back in Dec schedule her follow up appt with Dr. Regis Bill. She had no additional questions at this time. Nothing further is needed

## 2017-05-15 DIAGNOSIS — M1610 Unilateral primary osteoarthritis, unspecified hip: Secondary | ICD-10-CM | POA: Diagnosis not present

## 2017-05-15 DIAGNOSIS — M545 Low back pain: Secondary | ICD-10-CM | POA: Diagnosis not present

## 2017-05-20 ENCOUNTER — Ambulatory Visit
Admission: RE | Admit: 2017-05-20 | Discharge: 2017-05-20 | Disposition: A | Discharge status: Home / Self Care | Payer: Medicare Other | Source: Ambulatory Visit | Attending: Internal Medicine | Admitting: Internal Medicine

## 2017-05-20 DIAGNOSIS — Z1231 Encounter for screening mammogram for malignant neoplasm of breast: Secondary | ICD-10-CM | POA: Diagnosis not present

## 2017-06-05 ENCOUNTER — Ambulatory Visit (INDEPENDENT_AMBULATORY_CARE_PROVIDER_SITE_OTHER): Payer: Medicare Other

## 2017-06-05 DIAGNOSIS — Z23 Encounter for immunization: Secondary | ICD-10-CM | POA: Diagnosis not present

## 2017-06-18 DIAGNOSIS — Z6831 Body mass index (BMI) 31.0-31.9, adult: Secondary | ICD-10-CM | POA: Diagnosis not present

## 2017-06-18 DIAGNOSIS — M5416 Radiculopathy, lumbar region: Secondary | ICD-10-CM | POA: Diagnosis not present

## 2017-06-18 DIAGNOSIS — M48062 Spinal stenosis, lumbar region with neurogenic claudication: Secondary | ICD-10-CM | POA: Diagnosis not present

## 2017-06-18 DIAGNOSIS — M1610 Unilateral primary osteoarthritis, unspecified hip: Secondary | ICD-10-CM | POA: Diagnosis not present

## 2017-06-18 DIAGNOSIS — M545 Low back pain: Secondary | ICD-10-CM | POA: Diagnosis not present

## 2017-06-18 DIAGNOSIS — I10 Essential (primary) hypertension: Secondary | ICD-10-CM | POA: Diagnosis not present

## 2017-07-08 DIAGNOSIS — G8929 Other chronic pain: Secondary | ICD-10-CM | POA: Diagnosis not present

## 2017-07-08 DIAGNOSIS — M25561 Pain in right knee: Secondary | ICD-10-CM | POA: Diagnosis not present

## 2017-07-08 DIAGNOSIS — M25551 Pain in right hip: Secondary | ICD-10-CM | POA: Diagnosis not present

## 2017-07-29 DIAGNOSIS — M25561 Pain in right knee: Secondary | ICD-10-CM | POA: Diagnosis not present

## 2017-07-29 DIAGNOSIS — G8929 Other chronic pain: Secondary | ICD-10-CM | POA: Diagnosis not present

## 2017-08-15 DIAGNOSIS — M1611 Unilateral primary osteoarthritis, right hip: Secondary | ICD-10-CM | POA: Diagnosis not present

## 2017-08-15 DIAGNOSIS — M25551 Pain in right hip: Secondary | ICD-10-CM | POA: Diagnosis not present

## 2017-08-18 ENCOUNTER — Telehealth: Payer: Self-pay | Admitting: Internal Medicine

## 2017-08-18 NOTE — Telephone Encounter (Signed)
Surgical clearance form to be filled out.  Fax to [facility], Attn: Santiago [phone]

## 2017-08-19 NOTE — Telephone Encounter (Signed)
Form received and placed in [employer] on ledge.

## 2017-08-20 NOTE — Telephone Encounter (Signed)
Record review and form completed   Last exam   8 2018  No   high risk condition for surgery   So "cleared"

## 2017-08-22 NOTE — Telephone Encounter (Signed)
Pt aware that form is complete and will be faxed today.  This have been faxed and given back to Medical Records to be scanned and charged.  Nothing further needed.

## 2017-08-26 ENCOUNTER — Ambulatory Visit: Payer: Self-pay | Admitting: Orthopedic Surgery

## 2017-08-28 ENCOUNTER — Encounter: Payer: Self-pay | Admitting: Internal Medicine

## 2017-09-01 NOTE — Pre-Procedure Instructions (Addendum)
Surgical clearance Dr. Regis Bill 08/20/17 in chart. Last office visit note Dr. Regis Bill 03/19/17 in epic.

## 2017-09-01 NOTE — Patient Instructions (Signed)
Your procedure is scheduled on:  Thursday, Jan. 31, 2019   Surgery Time: 10:00AM-12:00PM   Report to [facility]  Entrance    Report to admitting at  8:00 AM    Call this number if you have problems the morning of surgery [phone]   Do not eat food or drink liquids :After Midnight.   Do NOT smoke after Midnight   Take these medicines the morning of surgery with A SIP OF WATER: Acyclovir, Synthroid                               You may not have any metal on your body including hair pins, jewelry, and body piercings             Do not wear make-up, lotions, powders, perfumes/cologne, or deodorant             Do not wear nail polish.  Do not shave  48 hours prior to surgery.               Do not bring valuables to the hospital. [place].   Contacts, dentures or bridgework may not be worn into surgery.   Leave suitcase in the car. After surgery it may be brought to your room.   Special Instructions: Bring a copy of your healthcare power of attorney and living will documents         the day of surgery if you haven't scanned them in before.              Please read over the following fact sheets you were given:   [facility] - Preparing for Surgery Before surgery, you can play an important role.  Because skin is not sterile, your skin needs to be as free of germs as possible.  You can reduce the number of germs on your skin by washing with CHG (chlorahexidine gluconate) soap before surgery.  CHG is an antiseptic cleaner which kills germs and bonds with the skin to continue killing germs even after washing. Please DO NOT use if you have an allergy to CHG or antibacterial soaps.  If your skin becomes reddened/irritated stop using the CHG and inform your nurse when you arrive at Short Stay. Do not shave (including legs and underarms) for at least 48 hours prior to the first CHG shower.  You may shave your  face/neck.  Please follow these instructions carefully:  1.  Shower with CHG Soap the night before surgery and the  morning of surgery.  2.  If you choose to wash your hair, wash your hair first as usual with your normal  shampoo.  3.  After you shampoo, rinse your hair and body thoroughly to remove the shampoo.                             4.  Use CHG as you would any other liquid soap.  You can apply chg directly to the skin and wash.  Gently with a scrungie or clean washcloth.  5.  Apply the CHG Soap to your body ONLY FROM THE NECK DOWN.   Do   not use on face/ open  Wound or open sores. Avoid contact with eyes, ears mouth and   genitals (private parts).                       Wash face,  Genitals (private parts) with your normal soap.             6.  Wash thoroughly, paying special attention to the area where your    surgery  will be performed.  7.  Thoroughly rinse your body with warm water from the neck down.  8.  DO NOT shower/wash with your normal soap after using and rinsing off the CHG Soap.                9.  Pat yourself dry with a clean towel.            10.  Wear clean pajamas.            11.  Place clean sheets on your bed the night of your first shower and do not  sleep with pets. Day of Surgery : Do not apply any lotions/deodorants the morning of surgery.  Please wear clean clothes to the hospital/surgery center.  FAILURE TO FOLLOW THESE INSTRUCTIONS MAY RESULT IN THE CANCELLATION OF YOUR SURGERY  PATIENT SIGNATURE_________________________________  NURSE SIGNATURE__________________________________  ________________________________________________________________________   Adam Phenix  An incentive spirometer is a tool that can help keep your lungs clear and active. This tool measures how well you are filling your lungs with each breath. Taking long deep breaths may help reverse or decrease the chance of developing breathing (pulmonary)  problems (especially infection) following:  A long period of time when you are unable to move or be active. BEFORE THE PROCEDURE   If the spirometer includes an indicator to show your best effort, your nurse or respiratory therapist will set it to a desired goal.  If possible, sit up straight or lean slightly forward. Try not to slouch.  Hold the incentive spirometer in an upright position. INSTRUCTIONS FOR USE  1. Sit on the edge of your bed if possible, or sit up as far as you can in bed or on a chair. 2. Hold the incentive spirometer in an upright position. 3. Breathe out normally. 4. Place the mouthpiece in your mouth and seal your lips tightly around it. 5. Breathe in slowly and as deeply as possible, raising the piston or the ball toward the top of the column. 6. Hold your breath for 3-5 seconds or for as long as possible. Allow the piston or ball to fall to the bottom of the column. 7. Remove the mouthpiece from your mouth and breathe out normally. 8. Rest for a few seconds and repeat Steps 1 through 7 at least 10 times every 1-2 hours when you are awake. Take your time and take a few normal breaths between deep breaths. 9. The spirometer may include an indicator to show your best effort. Use the indicator as a goal to work toward during each repetition. 10. After each set of 10 deep breaths, practice coughing to be sure your lungs are clear. If you have an incision (the cut made at the time of surgery), support your incision when coughing by placing a pillow or rolled up towels firmly against it. Once you are able to get out of bed, walk around indoors and cough well. You may stop using the incentive spirometer when instructed by your caregiver.  RISKS AND COMPLICATIONS  Take your time  so you do not get dizzy or light-headed.  If you are in pain, you may need to take or ask for pain medication before doing incentive spirometry. It is harder to take a deep breath if you are having  pain. AFTER USE  Rest and breathe slowly and easily.  It can be helpful to keep track of a log of your progress. Your caregiver can provide you with a simple table to help with this. If you are using the spirometer at home, follow these instructions: [place] IF:   You are having difficultly using the spirometer.  You have trouble using the spirometer as often as instructed.  Your pain medication is not giving enough relief while using the spirometer.  You develop fever of 100.5 F (38.1 C) or higher. SEEK IMMEDIATE MEDICAL CARE IF:   You cough up bloody sputum that had not been present before.  You develop fever of 102 F (38.9 C) or greater.  You develop worsening pain at or near the incision site. MAKE SURE YOU:   Understand these instructions.  Will watch your condition.  Will get help right away if you are not doing well or get worse. Document Released: 12/09/2006 Document Revised: 10/21/2011 Document Reviewed: 02/09/2007 Exit[employer] Patient Information 2014 [employer], [place].   ________________________________________________________________________  WHAT IS A BLOOD TRANSFUSION? Blood Transfusion Information  A transfusion is the replacement of blood or some of its parts. Blood is made up of multiple cells which provide different functions.  Red blood cells carry oxygen and are used for blood loss replacement.  White blood cells fight against infection.  Platelets control bleeding.  Plasma helps clot blood.  Other blood products are available for specialized needs, such as hemophilia or other clotting disorders. BEFORE THE TRANSFUSION  Who gives blood for transfusions?   Healthy volunteers who are fully evaluated to make sure their blood is safe. This is blood bank blood. Transfusion therapy is the safest it has ever been in the practice of medicine. Before blood is taken from a donor, a complete history is taken to make sure that person has no history  of diseases nor engages in risky social behavior (examples are intravenous drug use or sexual activity with multiple partners). The donor's travel history is screened to minimize risk of transmitting infections, such as malaria. The donated blood is tested for signs of infectious diseases, such as HIV and hepatitis. The blood is then tested to be sure it is compatible with you in order to minimize the chance of a transfusion reaction. If you or a relative donates blood, this is often done in anticipation of surgery and is not appropriate for emergency situations. It takes many days to process the donated blood. RISKS AND COMPLICATIONS Although transfusion therapy is very safe and saves many lives, the main dangers of transfusion include:   Getting an infectious disease.  Developing a transfusion reaction. This is an allergic reaction to something in the blood you were given. Every precaution is taken to prevent this. The decision to have a blood transfusion has been considered carefully by your caregiver before blood is given. Blood is not given unless the benefits outweigh the risks. AFTER THE TRANSFUSION  Right after receiving a blood transfusion, you will usually feel much better and more energetic. This is especially true if your red blood cells have gotten low (anemic). The transfusion raises the level of the red blood cells which carry oxygen, and this usually causes an energy increase.  The  nurse administering the transfusion will monitor you carefully for complications. HOME CARE INSTRUCTIONS  No special instructions are needed after a transfusion. You may find your energy is better. Speak with your caregiver about any limitations on activity for underlying diseases you may have. SEEK MEDICAL CARE IF:   Your condition is not improving after your transfusion.  You develop redness or irritation at the intravenous (IV) site. SEEK IMMEDIATE MEDICAL CARE IF:  Any of the following symptoms  occur over the next 12 hours:  Shaking chills.  You have a temperature by mouth above 102 F (38.9 C), not controlled by medicine.  Chest, back, or muscle pain.  People around you feel you are not acting correctly or are confused.  Shortness of breath or difficulty breathing.  Dizziness and fainting.  You get a rash or develop hives.  You have a decrease in urine output.  Your urine turns a dark color or changes to pink, red, or brown. Any of the following symptoms occur over the next 10 days:  You have a temperature by mouth above 102 F (38.9 C), not controlled by medicine.  Shortness of breath.  Weakness after normal activity.  The white part of the eye turns yellow (jaundice).  You have a decrease in the amount of urine or are urinating less often.  Your urine turns a dark color or changes to pink, red, or brown. Document Released: 07/26/2000 Document Revised: 10/21/2011 Document Reviewed: 03/14/2008 [facility] Patient Information 2014 [place], [place].  _______________________________________________________________________

## 2017-09-02 ENCOUNTER — Ambulatory Visit: Payer: Self-pay | Admitting: Orthopedic Surgery

## 2017-09-02 NOTE — H&P (Signed)
TOTAL HIP ADMISSION H&P  Patient is admitted for right total hip arthroplasty.  Subjective:  Chief Complaint: right hip pain  HPI: [name] [name], 77 y.o. female, has a history of pain and functional disability in the right hip(s) due to arthritis and patient has failed non-surgical conservative treatments for greater than 12 weeks to include NSAID's and/or analgesics, flexibility and strengthening excercises, use of assistive devices, weight reduction as appropriate and activity modification.  Onset of symptoms was gradual starting 3 years ago with rapidlly worsening course since that time.The patient noted no past surgery on the right hip(s).  Patient currently rates pain in the right hip at 10 out of 10 with activity. Patient has night pain, worsening of pain with activity and weight bearing, pain that interfers with activities of daily living, pain with passive range of motion and crepitus. Patient has evidence of subchondral cysts, subchondral sclerosis, periarticular osteophytes and joint space narrowing by imaging studies. This condition presents safety issues increasing the risk of falls.  There is no current active infection.  Patient Active Problem List   Diagnosis Date Noted  . Multinodular goiter 06/15/2015  . Post-surgical hypothyroidism 06/15/2015  . Leg pain, lateral 08/30/2014  . Hyperglycemia 11/05/2013  . Family history of Alzheimer's disease 11/05/2013  . Memory problem 11/05/2013  . Medication side effect 04/08/2013  . Lesion of spleen 04/08/2013  . Diarrhea 04/08/2013  . Arthritis of shoulder 11/03/2012  . History of back surgery 11/03/2012  . HSV-2 seropositive 02/17/2012  . Medicare annual wellness visit, subsequent 10/16/2011  . OSA (obstructive sleep apnea) 10/16/2011  . History of positive PPD 10/16/2011  . History of colonic polyps 07/18/2011  . Dyspnea 10/18/2010  . Heart burn 10/18/2010  . Chest pain 10/18/2010  . ADJUSTMENT DISORDER WITH ANXIETY  07/24/2010  . CHEST WALL PAIN, ANTERIOR 07/24/2010  . KNEE PAIN 04/04/2010  . SLEEP DISORDER/DISTURBANCE 11/21/2009  . LEG PAIN 02/28/2009  . TINNITUS 05/11/2008  . HEADACHE 02/07/2008  . UNSPECIFIED VITAMIN D DEFICIENCY 02/05/2008  . OBESITY, MILD 10/13/2007  . ANEMIA 10/13/2007  . GASTRITIS, CHRONIC 10/13/2007  . DUODENITIS, WITHOUT HEMORRHAGE 10/13/2007  . HIATAL HERNIA 10/13/2007  . IRRITABLE BOWEL SYNDROME 10/13/2007  . GRANULOMA 10/13/2007  . RHINITIS 09/23/2007  . FASTING HYPERGLYCEMIA 09/04/2007  . HYPERLIPIDEMIA 04/07/2007  . HYPERTENSION 04/07/2007  . GERD 04/07/2007   Past Medical History:  Diagnosis Date  . Abdominal pain     Neg ct [place] and mri of back   . Adenomatous colon polyp   . Allergy   . Anemia   . Anxiety   . Colon polyp   . Cough    ent evaluation  . DJD (degenerative joint disease) of lumbar spine   . Estrogen deficiency   . Gastritis   . GERD (gastroesophageal reflux disease)   . Headache(784.0)   . Helicobacter pylori infection 10/13/2007   Qualifier: Diagnosis of  By: Marland Mcalpine    . History of positive PPD 10/16/2011  . HSV-2 infection   . Hyperglycemia   . Hyperlipidemia   . Hypertension   . IBS (irritable bowel syndrome)   . Leg cramps 11/05/2013   Appears to have been from the higher dose of diuretic better on low dose and magnesium.   . Pulsatile tinnitus    had MRA and MPV nl mild carotid dopplers2010  . Shingles 02/09/2011   Right upper face and eyelid but not involving the eye at this point.   . Sleep apnea  does not use c pap  . Thyroid disease   . Vulvitis     Past Surgical History:  Procedure Laterality Date  . ABDOMINAL HYSTERECTOMY  1982   fibroids  . COLONOSCOPY  2003- 2102   adenomatous polyps  . FINGER SURGERY     left middle A1pulley release  . isthmuectomy     thyroid surgery  . LUMBAR SPINE SURGERY  05/1989   spine d/t tumor neurofibroma Quentin Cornwall  . SHOULDER ARTHROSCOPY     left  . THYROIDECTOMY      partial rt  ballen  . TONSILLECTOMY     at age 48.   . TUBAL LIGATION  1974    Current Outpatient Medications  Medication Sig Dispense Refill Last Dose  . acyclovir (ZOVIRAX) 400 MG tablet Take PO BID PRN (Patient taking differently: Take 400 mg by mouth daily. Take PO BID PRN) 60 tablet 12 Taking  . Black Pepper-Turmeric (TURMERIC COMPLEX/BLACK PEPPER PO) Take 1 capsule by mouth 2 (two) times a week.     . fluticasone (FLONASE) 50 MCG/ACT nasal spray INHALE 2 SPRAYS IN EACH NOSTRIL EVERY DAY FOR RHINITIS (Patient taking differently: INHALE 2 SPRAYS IN EACH NOSTRIL DAILY AS NEEDED FOR RHINITIS) 16 g 5 Taking  . Magnesium 250 MG TABS Take 250 mg by mouth 2 (two) times a week.     . naproxen sodium (ALEVE) 220 MG tablet Take 440 mg by mouth 2 (two) times daily as needed (for pain.).     Marland Kitchen SYNTHROID 100 MCG tablet TAKE 1 TABLET BY MOUTH EVERY DAY 90 tablet 1   . triamterene-hydrochlorothiazide (MAXZIDE-25) 37.5-25 MG tablet TAKE 1 TABLET BY MOUTH EVERY DAY 90 tablet 1    No current facility-administered medications for this visit.    Allergies  Allergen Reactions  . Lisinopril     REACTION: COUGH  . Lyrica [Pregabalin] Other (See Comments)    Cns side efffects  . Pravastatin Other (See Comments)    Cramps.  . Simvastatin     Myalgia and stiffness  . Codeine Phosphate     REACTION: itching  . Oxycodone Hcl     REACTION: rash    Social History   Tobacco Use  . Smoking status: Former Smoker    Packs/day: 0.50    Years: 23.00    Pack years: 11.50    Types: Cigarettes    Last attempt to quit: 06/16/1984    Years since quitting: 33.2  . Smokeless tobacco: Never Used  Substance Use Topics  . Alcohol use: No    Family History  Problem Relation Age of Onset  . Alzheimer's disease Mother   . Heart attack Father   . Asthma Sister   . Colon cancer Sister   . Heart attack Brother   . Colon polyps Sister   . Colon cancer Sister   . Colon cancer Paternal Aunt   . Thyroid  disease Sister        multinodular goiter  . Stomach cancer Neg Hx   . Breast cancer Neg Hx      Review of Systems  Constitutional: Negative.   HENT: Negative.   Eyes: Negative.   Respiratory: Negative.   Cardiovascular: Negative.   Gastrointestinal: Negative.   Musculoskeletal: Positive for back pain and joint pain.  Skin: Negative.   Neurological: Negative.   Endo/Heme/Allergies: Negative.   Psychiatric/Behavioral: The patient has insomnia.     Objective:  Physical Exam  Vitals reviewed. Constitutional: She is oriented to person, place,  and time. She appears well-developed and well-nourished.  HENT:  Head: Normocephalic and atraumatic.  Eyes: Conjunctivae and EOM are normal. Pupils are equal, round, and reactive to light.  Neck: Normal range of motion. Neck supple.  Cardiovascular: Normal rate and intact distal pulses.  Respiratory: Effort normal. No respiratory distress.  GI: Soft. She exhibits no distension.  Genitourinary:  Genitourinary Comments: deferred  Musculoskeletal:       Right hip: She exhibits decreased strength, bony tenderness and crepitus.  Neurological: She is alert and oriented to person, place, and time. She has normal reflexes.  Skin: Skin is warm and dry.  Psychiatric: She has a normal mood and affect. Her behavior is normal. Judgment and thought content normal.    Vital signs in last 24 hours: @VSRANGES @  Labs:   Estimated body mass index is 33.95 kg/m as calculated from the following:   Height as of 04/02/17: 5\' 5"  (1.651 m).   Weight as of 04/02/17: 92.5 kg (204 lb).   Imaging Review Plain radiographs demonstrate severe degenerative joint disease of the left hip(s). The bone quality appears to be adequate for age and reported activity level.  Assessment/Plan:  End stage arthritis, right hip(s)  The patient history, physical examination, clinical judgement of the provider and imaging studies are consistent with end stage degenerative  joint disease of the right hip(s) and total hip arthroplasty is deemed medically necessary. The treatment options including medical management, injection therapy, arthroscopy and arthroplasty were discussed at length. The risks and benefits of total hip arthroplasty were presented and reviewed. The risks due to aseptic loosening, infection, stiffness, dislocation/subluxation,  thromboembolic complications and other imponderables were discussed.  The patient acknowledged the explanation, agreed to proceed with the plan and consent was signed. Patient is being admitted for inpatient treatment for surgery, pain control, PT, OT, prophylactic antibiotics, VTE prophylaxis, progressive ambulation and ADL's and discharge planning.The patient is planning to be discharged home with HEP

## 2017-09-02 NOTE — Patient Instructions (Signed)
[name] [name]  09/02/2017   Your procedure is scheduled on: 09-11-17   Report to [facility] Main  Entrance Report to Admitting at 7:30 AM   Call this number if you have problems the morning of surgery [phone]   Remember: Do not eat food or drink liquids :After Midnight.     Take these medicines the morning of surgery with A SIP OF WATER: Synthroid                                You may not have any metal on your body including hair pins and              piercings  Do not wear jewelry, make-up, lotions, powders or perfumes, deodorant             Do not wear nail polish.  Do not shave  48 hours prior to surgery.               Do not bring valuables to the hospital. [place].  Contacts, dentures or bridgework may not be worn into surgery.  Leave suitcase in the car. After surgery it may be brought to your room.                Please read over the following fact sheets you were given: _____________________________________________________________________          [facility] - Preparing for Surgery Before surgery, you can play an important role.  Because skin is not sterile, your skin needs to be as free of germs as possible.  You can reduce the number of germs on your skin by washing with CHG (chlorahexidine gluconate) soap before surgery.  CHG is an antiseptic cleaner which kills germs and bonds with the skin to continue killing germs even after washing. Please DO NOT use if you have an allergy to CHG or antibacterial soaps.  If your skin becomes reddened/irritated stop using the CHG and inform your nurse when you arrive at Short Stay. Do not shave (including legs and underarms) for at least 48 hours prior to the first CHG shower.  You may shave your face/neck. Please follow these instructions carefully:  1.  Shower with CHG Soap the night before surgery and the  morning of Surgery.  2.  If you choose to  wash your hair, wash your hair first as usual with your  normal  shampoo.  3.  After you shampoo, rinse your hair and body thoroughly to remove the  shampoo.                           4.  Use CHG as you would any other liquid soap.  You can apply chg directly  to the skin and wash                       Gently with a scrungie or clean washcloth.  5.  Apply the CHG Soap to your body ONLY FROM THE NECK DOWN.   Do not use on face/ open  Wound or open sores. Avoid contact with eyes, ears mouth and genitals (private parts).                       Wash face,  Genitals (private parts) with your normal soap.             6.  Wash thoroughly, paying special attention to the area where your surgery  will be performed.  7.  Thoroughly rinse your body with warm water from the neck down.  8.  DO NOT shower/wash with your normal soap after using and rinsing off  the CHG Soap.                9.  Pat yourself dry with a clean towel.            10.  Wear clean pajamas.            11.  Place clean sheets on your bed the night of your first shower and do not  sleep with pets. Day of Surgery : Do not apply any lotions/deodorants the morning of surgery.  Please wear clean clothes to the hospital/surgery center.  FAILURE TO FOLLOW THESE INSTRUCTIONS MAY RESULT IN THE CANCELLATION OF YOUR SURGERY PATIENT SIGNATURE_________________________________  NURSE SIGNATURE__________________________________  ________________________________________________________________________   Adam Phenix  An incentive spirometer is a tool that can help keep your lungs clear and active. This tool measures how well you are filling your lungs with each breath. Taking long deep breaths may help reverse or decrease the chance of developing breathing (pulmonary) problems (especially infection) following:  A long period of time when you are unable to move or be active. BEFORE THE PROCEDURE   If the  spirometer includes an indicator to show your best effort, your nurse or respiratory therapist will set it to a desired goal.  If possible, sit up straight or lean slightly forward. Try not to slouch.  Hold the incentive spirometer in an upright position. INSTRUCTIONS FOR USE  1. Sit on the edge of your bed if possible, or sit up as far as you can in bed or on a chair. 2. Hold the incentive spirometer in an upright position. 3. Breathe out normally. 4. Place the mouthpiece in your mouth and seal your lips tightly around it. 5. Breathe in slowly and as deeply as possible, raising the piston or the ball toward the top of the column. 6. Hold your breath for 3-5 seconds or for as long as possible. Allow the piston or ball to fall to the bottom of the column. 7. Remove the mouthpiece from your mouth and breathe out normally. 8. Rest for a few seconds and repeat Steps 1 through 7 at least 10 times every 1-2 hours when you are awake. Take your time and take a few normal breaths between deep breaths. 9. The spirometer may include an indicator to show your best effort. Use the indicator as a goal to work toward during each repetition. 10. After each set of 10 deep breaths, practice coughing to be sure your lungs are clear. If you have an incision (the cut made at the time of surgery), support your incision when coughing by placing a pillow or rolled up towels firmly against it. Once you are able to get out of bed, walk around indoors and cough well. You may stop using the incentive spirometer when instructed by your caregiver.  RISKS AND COMPLICATIONS  Take your time so you do not get  dizzy or light-headed.  If you are in pain, you may need to take or ask for pain medication before doing incentive spirometry. It is harder to take a deep breath if you are having pain. AFTER USE  Rest and breathe slowly and easily.  It can be helpful to keep track of a log of your progress. Your caregiver can provide  you with a simple table to help with this. If you are using the spirometer at home, follow these instructions: [place] IF:   You are having difficultly using the spirometer.  You have trouble using the spirometer as often as instructed.  Your pain medication is not giving enough relief while using the spirometer.  You develop fever of 100.5 F (38.1 C) or higher. SEEK IMMEDIATE MEDICAL CARE IF:   You cough up bloody sputum that had not been present before.  You develop fever of 102 F (38.9 C) or greater.  You develop worsening pain at or near the incision site. MAKE SURE YOU:   Understand these instructions.  Will watch your condition.  Will get help right away if you are not doing well or get worse. Document Released: 12/09/2006 Document Revised: 10/21/2011 Document Reviewed: 02/09/2007 ExitCare Patient Information 2014 [employer], [place].   ________________________________________________________________________  WHAT IS A BLOOD TRANSFUSION? Blood Transfusion Information  A transfusion is the replacement of blood or some of its parts. Blood is made up of multiple cells which provide different functions.  Red blood cells carry oxygen and are used for blood loss replacement.  White blood cells fight against infection.  Platelets control bleeding.  Plasma helps clot blood.  Other blood products are available for specialized needs, such as hemophilia or other clotting disorders. BEFORE THE TRANSFUSION  Who gives blood for transfusions?   Healthy volunteers who are fully evaluated to make sure their blood is safe. This is blood bank blood. Transfusion therapy is the safest it has ever been in the practice of medicine. Before blood is taken from a donor, a complete history is taken to make sure that person has no history of diseases nor engages in risky social behavior (examples are intravenous drug use or sexual activity with multiple partners). The donor's  travel history is screened to minimize risk of transmitting infections, such as malaria. The donated blood is tested for signs of infectious diseases, such as HIV and hepatitis. The blood is then tested to be sure it is compatible with you in order to minimize the chance of a transfusion reaction. If you or a relative donates blood, this is often done in anticipation of surgery and is not appropriate for emergency situations. It takes many days to process the donated blood. RISKS AND COMPLICATIONS Although transfusion therapy is very safe and saves many lives, the main dangers of transfusion include:   Getting an infectious disease.  Developing a transfusion reaction. This is an allergic reaction to something in the blood you were given. Every precaution is taken to prevent this. The decision to have a blood transfusion has been considered carefully by your caregiver before blood is given. Blood is not given unless the benefits outweigh the risks. AFTER THE TRANSFUSION  Right after receiving a blood transfusion, you will usually feel much better and more energetic. This is especially true if your red blood cells have gotten low (anemic). The transfusion raises the level of the red blood cells which carry oxygen, and this usually causes an energy increase.  The nurse administering the transfusion will  monitor you carefully for complications. HOME CARE INSTRUCTIONS  No special instructions are needed after a transfusion. You may find your energy is better. Speak with your caregiver about any limitations on activity for underlying diseases you may have. SEEK MEDICAL CARE IF:   Your condition is not improving after your transfusion.  You develop redness or irritation at the intravenous (IV) site. SEEK IMMEDIATE MEDICAL CARE IF:  Any of the following symptoms occur over the next 12 hours:  Shaking chills.  You have a temperature by mouth above 102 F (38.9 C), not controlled by  medicine.  Chest, back, or muscle pain.  People around you feel you are not acting correctly or are confused.  Shortness of breath or difficulty breathing.  Dizziness and fainting.  You get a rash or develop hives.  You have a decrease in urine output.  Your urine turns a dark color or changes to pink, red, or brown. Any of the following symptoms occur over the next 10 days:  You have a temperature by mouth above 102 F (38.9 C), not controlled by medicine.  Shortness of breath.  Weakness after normal activity.  The white part of the eye turns yellow (jaundice).  You have a decrease in the amount of urine or are urinating less often.  Your urine turns a dark color or changes to pink, red, or brown. Document Released: 07/26/2000 Document Revised: 10/21/2011 Document Reviewed: 03/14/2008 [facility] Patient Information 2014 [place], [place].  _______________________________________________________________________

## 2017-09-02 NOTE — H&P (View-Only) (Signed)
TOTAL HIP ADMISSION H&P  Patient is admitted for right total hip arthroplasty.  Subjective:  Chief Complaint: right hip pain  HPI: [name] [name], 77 y.o. female, has a history of pain and functional disability in the right hip(s) due to arthritis and patient has failed non-surgical conservative treatments for greater than 12 weeks to include NSAID's and/or analgesics, flexibility and strengthening excercises, use of assistive devices, weight reduction as appropriate and activity modification.  Onset of symptoms was gradual starting 3 years ago with rapidlly worsening course since that time.The patient noted no past surgery on the right hip(s).  Patient currently rates pain in the right hip at 10 out of 10 with activity. Patient has night pain, worsening of pain with activity and weight bearing, pain that interfers with activities of daily living, pain with passive range of motion and crepitus. Patient has evidence of subchondral cysts, subchondral sclerosis, periarticular osteophytes and joint space narrowing by imaging studies. This condition presents safety issues increasing the risk of falls.  There is no current active infection.  Patient Active Problem List   Diagnosis Date Noted  . Multinodular goiter 06/15/2015  . Post-surgical hypothyroidism 06/15/2015  . Leg pain, lateral 08/30/2014  . Hyperglycemia 11/05/2013  . Family history of Alzheimer's disease 11/05/2013  . Memory problem 11/05/2013  . Medication side effect 04/08/2013  . Lesion of spleen 04/08/2013  . Diarrhea 04/08/2013  . Arthritis of shoulder 11/03/2012  . History of back surgery 11/03/2012  . HSV-2 seropositive 02/17/2012  . Medicare annual wellness visit, subsequent 10/16/2011  . OSA (obstructive sleep apnea) 10/16/2011  . History of positive PPD 10/16/2011  . History of colonic polyps 07/18/2011  . Dyspnea 10/18/2010  . Heart burn 10/18/2010  . Chest pain 10/18/2010  . ADJUSTMENT DISORDER WITH ANXIETY  07/24/2010  . CHEST WALL PAIN, ANTERIOR 07/24/2010  . KNEE PAIN 04/04/2010  . SLEEP DISORDER/DISTURBANCE 11/21/2009  . LEG PAIN 02/28/2009  . TINNITUS 05/11/2008  . HEADACHE 02/07/2008  . UNSPECIFIED VITAMIN D DEFICIENCY 02/05/2008  . OBESITY, MILD 10/13/2007  . ANEMIA 10/13/2007  . GASTRITIS, CHRONIC 10/13/2007  . DUODENITIS, WITHOUT HEMORRHAGE 10/13/2007  . HIATAL HERNIA 10/13/2007  . IRRITABLE BOWEL SYNDROME 10/13/2007  . GRANULOMA 10/13/2007  . RHINITIS 09/23/2007  . FASTING HYPERGLYCEMIA 09/04/2007  . HYPERLIPIDEMIA 04/07/2007  . HYPERTENSION 04/07/2007  . GERD 04/07/2007   Past Medical History:  Diagnosis Date  . Abdominal pain     Neg ct [place] and mri of back   . Adenomatous colon polyp   . Allergy   . Anemia   . Anxiety   . Colon polyp   . Cough    ent evaluation  . DJD (degenerative joint disease) of lumbar spine   . Estrogen deficiency   . Gastritis   . GERD (gastroesophageal reflux disease)   . Headache(784.0)   . Helicobacter pylori infection 10/13/2007   Qualifier: Diagnosis of  By: Marland Mcalpine    . History of positive PPD 10/16/2011  . HSV-2 infection   . Hyperglycemia   . Hyperlipidemia   . Hypertension   . IBS (irritable bowel syndrome)   . Leg cramps 11/05/2013   Appears to have been from the higher dose of diuretic better on low dose and magnesium.   . Pulsatile tinnitus    had MRA and MPV nl mild carotid dopplers2010  . Shingles 02/09/2011   Right upper face and eyelid but not involving the eye at this point.   . Sleep apnea  does not use c pap  . Thyroid disease   . Vulvitis     Past Surgical History:  Procedure Laterality Date  . ABDOMINAL HYSTERECTOMY  1982   fibroids  . COLONOSCOPY  2003- 2102   adenomatous polyps  . FINGER SURGERY     left middle A1pulley release  . isthmuectomy     thyroid surgery  . LUMBAR SPINE SURGERY  05/1989   spine d/t tumor neurofibroma Quentin Cornwall  . SHOULDER ARTHROSCOPY     left  . THYROIDECTOMY      partial rt  ballen  . TONSILLECTOMY     at age 57.   . TUBAL LIGATION  1974    Current Outpatient Medications  Medication Sig Dispense Refill Last Dose  . acyclovir (ZOVIRAX) 400 MG tablet Take PO BID PRN (Patient taking differently: Take 400 mg by mouth daily. Take PO BID PRN) 60 tablet 12 Taking  . Black Pepper-Turmeric (TURMERIC COMPLEX/BLACK PEPPER PO) Take 1 capsule by mouth 2 (two) times a week.     . fluticasone (FLONASE) 50 MCG/ACT nasal spray INHALE 2 SPRAYS IN EACH NOSTRIL EVERY DAY FOR RHINITIS (Patient taking differently: INHALE 2 SPRAYS IN EACH NOSTRIL DAILY AS NEEDED FOR RHINITIS) 16 g 5 Taking  . Magnesium 250 MG TABS Take 250 mg by mouth 2 (two) times a week.     . naproxen sodium (ALEVE) 220 MG tablet Take 440 mg by mouth 2 (two) times daily as needed (for pain.).     Marland Kitchen SYNTHROID 100 MCG tablet TAKE 1 TABLET BY MOUTH EVERY DAY 90 tablet 1   . triamterene-hydrochlorothiazide (MAXZIDE-25) 37.5-25 MG tablet TAKE 1 TABLET BY MOUTH EVERY DAY 90 tablet 1    No current facility-administered medications for this visit.    Allergies  Allergen Reactions  . Lisinopril     REACTION: COUGH  . Lyrica [Pregabalin] Other (See Comments)    Cns side efffects  . Pravastatin Other (See Comments)    Cramps.  . Simvastatin     Myalgia and stiffness  . Codeine Phosphate     REACTION: itching  . Oxycodone Hcl     REACTION: rash    Social History   Tobacco Use  . Smoking status: Former Smoker    Packs/day: 0.50    Years: 23.00    Pack years: 11.50    Types: Cigarettes    Last attempt to quit: 06/16/1984    Years since quitting: 33.2  . Smokeless tobacco: Never Used  Substance Use Topics  . Alcohol use: No    Family History  Problem Relation Age of Onset  . Alzheimer's disease Mother   . Heart attack Father   . Asthma Sister   . Colon cancer Sister   . Heart attack Brother   . Colon polyps Sister   . Colon cancer Sister   . Colon cancer Paternal Aunt   . Thyroid  disease Sister        multinodular goiter  . Stomach cancer Neg Hx   . Breast cancer Neg Hx      Review of Systems  Constitutional: Negative.   HENT: Negative.   Eyes: Negative.   Respiratory: Negative.   Cardiovascular: Negative.   Gastrointestinal: Negative.   Musculoskeletal: Positive for back pain and joint pain.  Skin: Negative.   Neurological: Negative.   Endo/Heme/Allergies: Negative.   Psychiatric/Behavioral: The patient has insomnia.     Objective:  Physical Exam  Vitals reviewed. Constitutional: She is oriented to person, place,  and time. She appears well-developed and well-nourished.  HENT:  Head: Normocephalic and atraumatic.  Eyes: Conjunctivae and EOM are normal. Pupils are equal, round, and reactive to light.  Neck: Normal range of motion. Neck supple.  Cardiovascular: Normal rate and intact distal pulses.  Respiratory: Effort normal. No respiratory distress.  GI: Soft. She exhibits no distension.  Genitourinary:  Genitourinary Comments: deferred  Musculoskeletal:       Right hip: She exhibits decreased strength, bony tenderness and crepitus.  Neurological: She is alert and oriented to person, place, and time. She has normal reflexes.  Skin: Skin is warm and dry.  Psychiatric: She has a normal mood and affect. Her behavior is normal. Judgment and thought content normal.    Vital signs in last 24 hours: @VSRANGES @  Labs:   Estimated body mass index is 33.95 kg/m as calculated from the following:   Height as of 04/02/17: 5\' 5"  (1.651 m).   Weight as of 04/02/17: 92.5 kg (204 lb).   Imaging Review Plain radiographs demonstrate severe degenerative joint disease of the left hip(s). The bone quality appears to be adequate for age and reported activity level.  Assessment/Plan:  End stage arthritis, right hip(s)  The patient history, physical examination, clinical judgement of the provider and imaging studies are consistent with end stage degenerative  joint disease of the right hip(s) and total hip arthroplasty is deemed medically necessary. The treatment options including medical management, injection therapy, arthroscopy and arthroplasty were discussed at length. The risks and benefits of total hip arthroplasty were presented and reviewed. The risks due to aseptic loosening, infection, stiffness, dislocation/subluxation,  thromboembolic complications and other imponderables were discussed.  The patient acknowledged the explanation, agreed to proceed with the plan and consent was signed. Patient is being admitted for inpatient treatment for surgery, pain control, PT, OT, prophylactic antibiotics, VTE prophylaxis, progressive ambulation and ADL's and discharge planning.The patient is planning to be discharged home with HEP

## 2017-09-03 ENCOUNTER — Encounter (HOSPITAL_COMMUNITY): Payer: Self-pay

## 2017-09-03 ENCOUNTER — Encounter (HOSPITAL_COMMUNITY)
Admission: RE | Admit: 2017-09-03 | Discharge: 2017-09-03 | Disposition: A | Discharge status: Home / Self Care | Payer: Medicare Other | Source: Ambulatory Visit | Attending: Internal Medicine | Admitting: Internal Medicine

## 2017-09-03 ENCOUNTER — Other Ambulatory Visit: Payer: Self-pay

## 2017-09-03 ENCOUNTER — Encounter (HOSPITAL_COMMUNITY)
Admission: RE | Admit: 2017-09-03 | Discharge: 2017-09-03 | Disposition: A | Discharge status: Home / Self Care | Payer: Medicare Other | Source: Ambulatory Visit | Attending: Orthopedic Surgery | Admitting: Orthopedic Surgery

## 2017-09-03 DIAGNOSIS — M1611 Unilateral primary osteoarthritis, right hip: Secondary | ICD-10-CM | POA: Insufficient documentation

## 2017-09-03 DIAGNOSIS — Z01810 Encounter for preprocedural cardiovascular examination: Secondary | ICD-10-CM | POA: Insufficient documentation

## 2017-09-03 DIAGNOSIS — I10 Essential (primary) hypertension: Secondary | ICD-10-CM | POA: Diagnosis not present

## 2017-09-03 DIAGNOSIS — Z01812 Encounter for preprocedural laboratory examination: Secondary | ICD-10-CM | POA: Insufficient documentation

## 2017-09-03 LAB — BASIC METABOLIC PANEL
Anion gap: 8 (ref 5–15)
BUN: 20 mg/dL (ref 6–20)
CO2: 26 mmol/L (ref 22–32)
Calcium: 9.4 mg/dL (ref 8.9–10.3)
Chloride: 105 mmol/L (ref 101–111)
Creatinine, Ser: 0.83 mg/dL (ref 0.44–1.00)
GFR calc Af Amer: >60 mL/min (ref >60)
GFR calc non Af Amer: >60 mL/min (ref >60)
Glucose, Bld: 90 mg/dL (ref 65–99)
Potassium: 3.9 mmol/L (ref 3.5–5.1)
Sodium: 139 mmol/L (ref 135–145)

## 2017-09-03 LAB — SURGICAL PCR SCREEN
MRSA, PCR: NEGATIVE
Staphylococcus aureus: POSITIVE — AB

## 2017-09-03 LAB — HEMOGLOBIN A1C
Hgb A1c MFr Bld: 5.7 % — ABNORMAL HIGH (ref 4.8–5.6)
Mean Plasma Glucose: 116.89 mg/dL

## 2017-09-03 LAB — CBC
HCT: 36.3 % (ref 36.0–46.0)
Hemoglobin: 11.3 g/dL — ABNORMAL LOW (ref 12.0–15.0)
MCH: 28.4 pg (ref 26.0–34.0)
MCHC: 31.1 g/dL (ref 30.0–36.0)
MCV: 91.2 fL (ref 78.0–100.0)
Platelets: 220 10*3/uL (ref 150–400)
RBC: 3.98 MIL/uL (ref 3.87–5.11)
RDW: 13.5 % (ref 11.5–15.5)
WBC: 8 10*3/uL (ref 4.0–10.5)

## 2017-09-03 LAB — ABO/RH: ABO/RH(D): A POS

## 2017-09-04 NOTE — Pre-Procedure Instructions (Signed)
CBC, Hgb A1c, PCR resukts 09/03/17 faxed to Dr. Lyla Glassing via epic.

## 2017-09-10 MED ORDER — TRANEXAMIC ACID 1000 MG/10ML IV SOLN
1000.0000 mg | INTRAVENOUS | Status: AC
  Filled 2017-09-10: qty 10

## 2017-09-10 NOTE — Progress Notes (Addendum)
Left voice message to advise patient that her surgery time for 09-11-17 is now 12:30. Pt is to report to admitting at 10:00 AM... Awaiting a return call.  15:35 Left 2nd voice message related to surgery time. Awaiting return call.

## 2017-09-11 LAB — TYPE AND SCREEN
ABO/RH(D): A POS
Antibody Screen: NEGATIVE

## 2017-09-11 NOTE — Progress Notes (Signed)
Please place new surgery orders in epic for 09-11-17 surgery, labs and consent will be 19 days old day of surgery and must be redone. thanks

## 2017-09-18 DIAGNOSIS — H35372 Puckering of macula, left eye: Secondary | ICD-10-CM | POA: Diagnosis not present

## 2017-09-18 DIAGNOSIS — H33193 Other retinoschisis and retinal cysts, bilateral: Secondary | ICD-10-CM | POA: Diagnosis not present

## 2017-09-18 DIAGNOSIS — H35363 Drusen (degenerative) of macula, bilateral: Secondary | ICD-10-CM | POA: Diagnosis not present

## 2017-09-18 DIAGNOSIS — Z961 Presence of intraocular lens: Secondary | ICD-10-CM | POA: Diagnosis not present

## 2017-09-18 LAB — HM DIABETES EYE EXAM

## 2017-09-23 ENCOUNTER — Other Ambulatory Visit (HOSPITAL_COMMUNITY): Payer: Self-pay | Admitting: Emergency Medicine

## 2017-09-23 NOTE — Patient Instructions (Addendum)
[name] [name]  09/23/2017   Your procedure is scheduled on: 10-02-17  Report to [facility]    Report to admitting at 10:45AM   Call this number if you have problems the morning of surgery [phone]     Remember: Do not eat food After Midnight. You may have clear liquids from midnight until 7:15am of surgery. Nothing by mouth after 7:15am!     Take these medicines the morning of surgery with A SIP OF WATER: synthroid, nasal spray if needed                                You may not have any metal on your body including hair pins and              piercings  Do not wear jewelry, make-up, lotions, powders or perfumes, deodorant             Do not wear nail polish.  Do not shave  48 hours prior to surgery.        Do not bring valuables to the hospital. [place].  Contacts, dentures or bridgework may not be worn into surgery.  Leave suitcase in the car. After surgery it may be brought to your room.                Please read over the following fact sheets you were given: _____________________________________________________________________     CLEAR LIQUID DIET   Foods Allowed                                                                     Foods Excluded  Coffee and tea, regular and decaf                             liquids that you cannot  Plain Jell-O in any flavor                                             see through such as: Fruit ices (not with fruit pulp)                                     milk, soups, orange juice  Iced Popsicles                                    All solid food Carbonated beverages, regular and diet                                    Cranberry, grape and apple juices Sports drinks like Gatorade Lightly  seasoned clear broth or consume(fat free) Sugar, honey syrup  Sample Menu Breakfast                                Lunch                                      Supper Cranberry juice                    Beef broth                            Chicken broth Jell-O                                     Grape juice                           Apple juice Coffee or tea                        Jell-O                                      Popsicle                                                Coffee or tea                        Coffee or tea  _____________________________________________________________________  [facility] Preparing for Surgery Before surgery, you can play an important role.  Because skin is not sterile, your skin needs to be as free of germs as possible.  You can reduce the number of germs on your skin by washing with CHG (chlorahexidine gluconate) soap before surgery.  CHG is an antiseptic cleaner which kills germs and bonds with the skin to continue killing germs even after washing. Please DO NOT use if you have an allergy to CHG or antibacterial soaps.  If your skin becomes reddened/irritated stop using the CHG and inform your nurse when you arrive at Short Stay. Do not shave (including legs and underarms) for at least 48 hours prior to the first CHG shower.  You may shave your face/neck. Please follow these instructions carefully:  1.  Shower with CHG Soap the night before surgery and the  morning of Surgery.  2.  If you choose to wash your hair, wash your hair first as usual with your  normal  shampoo.  3.  After you shampoo, rinse your hair and body thoroughly to remove the  shampoo.                           4.  Use CHG as you would any other liquid soap.  You can apply chg directly  to the skin and wash  Gently with a scrungie or clean washcloth.  5.  Apply the CHG Soap to your body ONLY FROM THE NECK DOWN.   Do not use on face/ open                           Wound or open sores. Avoid contact with eyes, ears mouth and genitals (private parts).                       Wash face,  Genitals (private parts) with  your normal soap.             6.  Wash thoroughly, paying special attention to the area where your surgery  will be performed.  7.  Thoroughly rinse your body with warm water from the neck down.  8.  DO NOT shower/wash with your normal soap after using and rinsing off  the CHG Soap.                9.  Pat yourself dry with a clean towel.            10.  Wear clean pajamas.            11.  Place clean sheets on your bed the night of your first shower and do not  sleep with pets. Day of Surgery : Do not apply any lotions/deodorants the morning of surgery.  Please wear clean clothes to the hospital/surgery center.  FAILURE TO FOLLOW THESE INSTRUCTIONS MAY RESULT IN THE CANCELLATION OF YOUR SURGERY PATIENT SIGNATURE_________________________________  NURSE SIGNATURE__________________________________  ________________________________________________________________________

## 2017-09-25 ENCOUNTER — Encounter (HOSPITAL_COMMUNITY)
Admission: RE | Admit: 2017-09-25 | Discharge: 2017-09-25 | Disposition: A | Discharge status: Home / Self Care | Payer: Medicare Other | Source: Ambulatory Visit | Attending: Orthopedic Surgery | Admitting: Orthopedic Surgery

## 2017-09-25 ENCOUNTER — Other Ambulatory Visit: Payer: Self-pay

## 2017-09-25 ENCOUNTER — Encounter (HOSPITAL_COMMUNITY): Payer: Self-pay

## 2017-09-25 DIAGNOSIS — G4730 Sleep apnea, unspecified: Secondary | ICD-10-CM | POA: Diagnosis not present

## 2017-09-25 DIAGNOSIS — R51 Headache: Secondary | ICD-10-CM | POA: Diagnosis not present

## 2017-09-25 DIAGNOSIS — Z01812 Encounter for preprocedural laboratory examination: Secondary | ICD-10-CM | POA: Insufficient documentation

## 2017-09-25 DIAGNOSIS — F419 Anxiety disorder, unspecified: Secondary | ICD-10-CM | POA: Insufficient documentation

## 2017-09-25 DIAGNOSIS — I10 Essential (primary) hypertension: Secondary | ICD-10-CM | POA: Diagnosis not present

## 2017-09-25 DIAGNOSIS — Z79899 Other long term (current) drug therapy: Secondary | ICD-10-CM | POA: Insufficient documentation

## 2017-09-25 DIAGNOSIS — Z87891 Personal history of nicotine dependence: Secondary | ICD-10-CM | POA: Diagnosis not present

## 2017-09-25 DIAGNOSIS — M1611 Unilateral primary osteoarthritis, right hip: Secondary | ICD-10-CM | POA: Insufficient documentation

## 2017-09-25 DIAGNOSIS — E039 Hypothyroidism, unspecified: Secondary | ICD-10-CM | POA: Insufficient documentation

## 2017-09-25 DIAGNOSIS — K219 Gastro-esophageal reflux disease without esophagitis: Secondary | ICD-10-CM | POA: Diagnosis not present

## 2017-09-25 LAB — BASIC METABOLIC PANEL
Anion gap: 9 (ref 5–15)
BUN: 19 mg/dL (ref 6–20)
CO2: 26 mmol/L (ref 22–32)
Calcium: 9.8 mg/dL (ref 8.9–10.3)
Chloride: 104 mmol/L (ref 101–111)
Creatinine, Ser: 0.84 mg/dL (ref 0.44–1.00)
GFR calc Af Amer: >60 mL/min (ref >60)
GFR calc non Af Amer: >60 mL/min (ref >60)
Glucose, Bld: 89 mg/dL (ref 65–99)
Potassium: 4.3 mmol/L (ref 3.5–5.1)
Sodium: 139 mmol/L (ref 135–145)

## 2017-09-25 LAB — CBC
HCT: 39.4 % (ref 36.0–46.0)
Hemoglobin: 12.7 g/dL (ref 12.0–15.0)
MCH: 29.1 pg (ref 26.0–34.0)
MCHC: 32.2 g/dL (ref 30.0–36.0)
MCV: 90.2 fL (ref 78.0–100.0)
Platelets: 246 10*3/uL (ref 150–400)
RBC: 4.37 MIL/uL (ref 3.87–5.11)
RDW: 13.2 % (ref 11.5–15.5)
WBC: 5.9 10*3/uL (ref 4.0–10.5)

## 2017-09-25 LAB — SURGICAL PCR SCREEN
MRSA, PCR: NEGATIVE
Staphylococcus aureus: NEGATIVE

## 2017-09-25 NOTE — Progress Notes (Signed)
Oral temp at PAT appt 99 degrees farenheit. Patient admits to some productive cough. Small amounts and clear over last few days. Admits to being around family member with some cold symptoms. RN advised to monitor sputum for color changes and if coughing or congestion worsen or if new cold/flu symptoms develop to contact her PCP for eval and make surgeons office aware. Patient verbalizes understanding and states she will take some cold/flu OTC medicine she has at home.

## 2017-09-30 ENCOUNTER — Encounter: Payer: Self-pay | Admitting: Internal Medicine

## 2017-10-02 ENCOUNTER — Inpatient Hospital Stay (HOSPITAL_COMMUNITY): Payer: Medicare Other | Admitting: Anesthesiology

## 2017-10-02 ENCOUNTER — Inpatient Hospital Stay (HOSPITAL_COMMUNITY): Payer: Medicare Other

## 2017-10-02 ENCOUNTER — Inpatient Hospital Stay (HOSPITAL_COMMUNITY)
Admission: RE | Admit: 2017-10-02 | Discharge: 2017-10-03 | DRG: 470 | Disposition: A | Discharge status: Home / Self Care | Payer: Medicare Other | Source: Ambulatory Visit | Attending: Orthopedic Surgery | Admitting: Orthopedic Surgery

## 2017-10-02 ENCOUNTER — Encounter (HOSPITAL_COMMUNITY): Payer: Self-pay | Admitting: *Deleted

## 2017-10-02 ENCOUNTER — Encounter (HOSPITAL_COMMUNITY)
Admission: RE | Disposition: A | Discharge status: Home / Self Care | Payer: Self-pay | Source: Ambulatory Visit | Attending: Orthopedic Surgery

## 2017-10-02 DIAGNOSIS — Z87891 Personal history of nicotine dependence: Secondary | ICD-10-CM | POA: Diagnosis not present

## 2017-10-02 DIAGNOSIS — K589 Irritable bowel syndrome without diarrhea: Secondary | ICD-10-CM | POA: Diagnosis present

## 2017-10-02 DIAGNOSIS — G4700 Insomnia, unspecified: Secondary | ICD-10-CM | POA: Diagnosis present

## 2017-10-02 DIAGNOSIS — Z8349 Family history of other endocrine, nutritional and metabolic diseases: Secondary | ICD-10-CM

## 2017-10-02 DIAGNOSIS — I10 Essential (primary) hypertension: Secondary | ICD-10-CM | POA: Diagnosis not present

## 2017-10-02 DIAGNOSIS — Z7951 Long term (current) use of inhaled steroids: Secondary | ICD-10-CM | POA: Diagnosis not present

## 2017-10-02 DIAGNOSIS — Z96641 Presence of right artificial hip joint: Secondary | ICD-10-CM | POA: Diagnosis not present

## 2017-10-02 DIAGNOSIS — Z471 Aftercare following joint replacement surgery: Secondary | ICD-10-CM | POA: Diagnosis not present

## 2017-10-02 DIAGNOSIS — Z825 Family history of asthma and other chronic lower respiratory diseases: Secondary | ICD-10-CM | POA: Diagnosis not present

## 2017-10-02 DIAGNOSIS — M1611 Unilateral primary osteoarthritis, right hip: Secondary | ICD-10-CM | POA: Diagnosis not present

## 2017-10-02 DIAGNOSIS — K219 Gastro-esophageal reflux disease without esophagitis: Secondary | ICD-10-CM | POA: Diagnosis present

## 2017-10-02 DIAGNOSIS — Z86010 Personal history of colonic polyps: Secondary | ICD-10-CM

## 2017-10-02 DIAGNOSIS — E785 Hyperlipidemia, unspecified: Secondary | ICD-10-CM | POA: Diagnosis not present

## 2017-10-02 DIAGNOSIS — Z885 Allergy status to narcotic agent status: Secondary | ICD-10-CM

## 2017-10-02 DIAGNOSIS — Z09 Encounter for follow-up examination after completed treatment for conditions other than malignant neoplasm: Secondary | ICD-10-CM

## 2017-10-02 DIAGNOSIS — Z820 Family history of epilepsy and other diseases of the nervous system: Secondary | ICD-10-CM

## 2017-10-02 DIAGNOSIS — Z888 Allergy status to other drugs, medicaments and biological substances status: Secondary | ICD-10-CM

## 2017-10-02 DIAGNOSIS — Z8249 Family history of ischemic heart disease and other diseases of the circulatory system: Secondary | ICD-10-CM | POA: Diagnosis not present

## 2017-10-02 DIAGNOSIS — Z800 Family history of malignant neoplasm of digestive organs: Secondary | ICD-10-CM

## 2017-10-02 DIAGNOSIS — E039 Hypothyroidism, unspecified: Secondary | ICD-10-CM | POA: Diagnosis not present

## 2017-10-02 DIAGNOSIS — Z8371 Family history of colonic polyps: Secondary | ICD-10-CM

## 2017-10-02 DIAGNOSIS — E890 Postprocedural hypothyroidism: Secondary | ICD-10-CM | POA: Diagnosis not present

## 2017-10-02 DIAGNOSIS — G4733 Obstructive sleep apnea (adult) (pediatric): Secondary | ICD-10-CM | POA: Diagnosis not present

## 2017-10-02 DIAGNOSIS — Z79890 Hormone replacement therapy (postmenopausal): Secondary | ICD-10-CM | POA: Diagnosis not present

## 2017-10-02 DIAGNOSIS — Z419 Encounter for procedure for purposes other than remedying health state, unspecified: Secondary | ICD-10-CM

## 2017-10-02 DIAGNOSIS — Z90710 Acquired absence of both cervix and uterus: Secondary | ICD-10-CM

## 2017-10-02 DIAGNOSIS — M25551 Pain in right hip: Secondary | ICD-10-CM | POA: Diagnosis not present

## 2017-10-02 HISTORY — PX: TOTAL HIP ARTHROPLASTY: SHX124

## 2017-10-02 LAB — TYPE AND SCREEN
ABO/RH(D): A POS
Antibody Screen: NEGATIVE

## 2017-10-02 SURGERY — ARTHROPLASTY, HIP, TOTAL, ANTERIOR APPROACH
Anesthesia: Spinal | Site: Hip | Laterality: Right

## 2017-10-02 MED ORDER — HYDROMORPHONE HCL 1 MG/ML IJ SOLN: 0.5000 mg | INTRAMUSCULAR | Status: DC | PRN

## 2017-10-02 MED ORDER — DEXTROSE 5 % IV SOLN
500.0000 mg | Freq: Four times a day (QID) | INTRAVENOUS | Status: DC | PRN
  Administered 2017-10-02: 500 mg via INTRAVENOUS
  Filled 2017-10-02: qty 550

## 2017-10-02 MED ORDER — ACYCLOVIR 400 MG PO TABS
400.0000 mg | ORAL_TABLET | Freq: Every day | ORAL | Status: DC
  Administered 2017-10-02 – 2017-10-03 (×2): 400 mg via ORAL
  Filled 2017-10-02 (×2): qty 1

## 2017-10-02 MED ORDER — ISOPROPYL ALCOHOL 70 % SOLN
Status: DC | PRN
  Administered 2017-10-02: 1 via TOPICAL

## 2017-10-02 MED ORDER — MENTHOL 3 MG MT LOZG: 1.0000 | LOZENGE | OROMUCOSAL | Status: DC | PRN

## 2017-10-02 MED ORDER — WATER FOR IRRIGATION, STERILE IR SOLN
Status: DC | PRN
  Administered 2017-10-02: 2000 mL

## 2017-10-02 MED ORDER — PROPOFOL 10 MG/ML IV BOLUS
INTRAVENOUS | Status: AC
  Filled 2017-10-02: qty 20

## 2017-10-02 MED ORDER — LEVOTHYROXINE SODIUM 100 MCG PO TABS
100.0000 ug | ORAL_TABLET | Freq: Every day | ORAL | Status: DC
  Administered 2017-10-03: 08:00:00 100 ug via ORAL
  Filled 2017-10-02: qty 1

## 2017-10-02 MED ORDER — DEXAMETHASONE SODIUM PHOSPHATE 10 MG/ML IJ SOLN
10.0000 mg | Freq: Once | INTRAMUSCULAR | Status: AC
  Administered 2017-10-03: 10:00:00 10 mg via INTRAVENOUS
  Filled 2017-10-02: qty 1

## 2017-10-02 MED ORDER — SODIUM CHLORIDE 0.9 % IJ SOLN
INTRAMUSCULAR | Status: AC
  Filled 2017-10-02: qty 50

## 2017-10-02 MED ORDER — PHENOL 1.4 % MT LIQD: 1.0000 | OROMUCOSAL | Status: DC | PRN

## 2017-10-02 MED ORDER — POVIDONE-IODINE 10 % EX SWAB: 2.0000 "application " | Freq: Once | CUTANEOUS | Status: DC

## 2017-10-02 MED ORDER — ACETAMINOPHEN 10 MG/ML IV SOLN
1000.0000 mg | INTRAVENOUS | Status: AC
  Administered 2017-10-02: 1000 mg via INTRAVENOUS
  Filled 2017-10-02: qty 100

## 2017-10-02 MED ORDER — CHLORHEXIDINE GLUCONATE 4 % EX LIQD: 60.0000 mL | Freq: Once | CUTANEOUS | Status: DC

## 2017-10-02 MED ORDER — DIPHENHYDRAMINE HCL 12.5 MG/5ML PO ELIX: 12.5000 mg | ORAL_SOLUTION | ORAL | Status: DC | PRN

## 2017-10-02 MED ORDER — METOCLOPRAMIDE HCL 5 MG PO TABS: 5.0000 mg | ORAL_TABLET | Freq: Three times a day (TID) | ORAL | Status: DC | PRN

## 2017-10-02 MED ORDER — LACTATED RINGERS IV SOLN
INTRAVENOUS | Status: DC | PRN
  Administered 2017-10-02: 15:00:00 via INTRAVENOUS

## 2017-10-02 MED ORDER — METHOCARBAMOL 500 MG PO TABS
500.0000 mg | ORAL_TABLET | Freq: Four times a day (QID) | ORAL | Status: DC | PRN
  Administered 2017-10-02 – 2017-10-03 (×2): 500 mg via ORAL
  Filled 2017-10-02 (×2): qty 1

## 2017-10-02 MED ORDER — KETOROLAC TROMETHAMINE 30 MG/ML IJ SOLN
INTRAMUSCULAR | Status: AC
  Filled 2017-10-02: qty 1

## 2017-10-02 MED ORDER — SODIUM CHLORIDE 0.9 % IJ SOLN
INTRAMUSCULAR | Status: DC | PRN
  Administered 2017-10-02: 30 mL

## 2017-10-02 MED ORDER — DEXAMETHASONE SODIUM PHOSPHATE 10 MG/ML IJ SOLN
INTRAMUSCULAR | Status: DC | PRN
  Administered 2017-10-02: 10 mg via INTRAVENOUS

## 2017-10-02 MED ORDER — ALUM & MAG HYDROXIDE-SIMETH 200-200-20 MG/5ML PO SUSP
30.0000 mL | ORAL | Status: DC | PRN
  Administered 2017-10-03: 05:00:00 30 mL via ORAL
  Filled 2017-10-02: qty 30

## 2017-10-02 MED ORDER — POLYETHYLENE GLYCOL 3350 17 G PO PACK: 17.0000 g | PACK | Freq: Every day | ORAL | Status: DC | PRN

## 2017-10-02 MED ORDER — BUPIVACAINE-EPINEPHRINE 0.25% -1:200000 IJ SOLN
INTRAMUSCULAR | Status: AC
  Filled 2017-10-02: qty 1

## 2017-10-02 MED ORDER — METOCLOPRAMIDE HCL 5 MG/ML IJ SOLN: 5.0000 mg | Freq: Three times a day (TID) | INTRAMUSCULAR | Status: DC | PRN

## 2017-10-02 MED ORDER — CEFAZOLIN SODIUM-DEXTROSE 2-4 GM/100ML-% IV SOLN
2.0000 g | INTRAVENOUS | Status: AC
  Administered 2017-10-02: 2 g via INTRAVENOUS
  Filled 2017-10-02: qty 100

## 2017-10-02 MED ORDER — BUPIVACAINE IN DEXTROSE 0.75-8.25 % IT SOLN
INTRATHECAL | Status: DC | PRN
  Administered 2017-10-02: 15 mg via INTRATHECAL

## 2017-10-02 MED ORDER — FENTANYL CITRATE (PF) 100 MCG/2ML IJ SOLN
25.0000 ug | INTRAMUSCULAR | Status: DC | PRN
  Administered 2017-10-02 (×3): 50 ug via INTRAVENOUS

## 2017-10-02 MED ORDER — SODIUM CHLORIDE 0.9 % IR SOLN
Status: DC | PRN
  Administered 2017-10-02: 1000 mL

## 2017-10-02 MED ORDER — ONDANSETRON HCL 4 MG/2ML IJ SOLN: 4.0000 mg | Freq: Four times a day (QID) | INTRAMUSCULAR | Status: DC | PRN

## 2017-10-02 MED ORDER — SODIUM CHLORIDE 0.9 % IR SOLN
Status: DC | PRN
  Administered 2017-10-02: 3000 mL

## 2017-10-02 MED ORDER — TRIAMTERENE-HCTZ 37.5-25 MG PO TABS
1.0000 | ORAL_TABLET | Freq: Every day | ORAL | Status: DC
  Filled 2017-10-02: qty 1

## 2017-10-02 MED ORDER — ONDANSETRON HCL 4 MG/2ML IJ SOLN
INTRAMUSCULAR | Status: DC | PRN
  Administered 2017-10-02: 4 mg via INTRAVENOUS

## 2017-10-02 MED ORDER — PROPOFOL 10 MG/ML IV BOLUS
INTRAVENOUS | Status: AC
  Filled 2017-10-02: qty 40

## 2017-10-02 MED ORDER — ACETAMINOPHEN 325 MG PO TABS: 650.0000 mg | ORAL_TABLET | ORAL | Status: DC | PRN

## 2017-10-02 MED ORDER — SODIUM CHLORIDE 0.9 % IV SOLN
INTRAVENOUS | Status: DC
  Administered 2017-10-02: 12:00:00 via INTRAVENOUS

## 2017-10-02 MED ORDER — BUPIVACAINE-EPINEPHRINE 0.25% -1:200000 IJ SOLN
INTRAMUSCULAR | Status: DC | PRN
  Administered 2017-10-02: 30 mL

## 2017-10-02 MED ORDER — TRANEXAMIC ACID 1000 MG/10ML IV SOLN
1000.0000 mg | INTRAVENOUS | Status: AC
  Administered 2017-10-02: 1000 mg via INTRAVENOUS
  Filled 2017-10-02: qty 1100

## 2017-10-02 MED ORDER — PROPOFOL 500 MG/50ML IV EMUL
INTRAVENOUS | Status: DC | PRN
  Administered 2017-10-02: 20 mg via INTRAVENOUS

## 2017-10-02 MED ORDER — SODIUM CHLORIDE 0.9 % IV SOLN
INTRAVENOUS | Status: DC
  Administered 2017-10-02: 19:00:00 via INTRAVENOUS

## 2017-10-02 MED ORDER — KETOROLAC TROMETHAMINE 30 MG/ML IJ SOLN
INTRAMUSCULAR | Status: DC | PRN
  Administered 2017-10-02: 30 mg

## 2017-10-02 MED ORDER — DEXAMETHASONE SODIUM PHOSPHATE 10 MG/ML IJ SOLN
INTRAMUSCULAR | Status: AC
  Filled 2017-10-02: qty 1

## 2017-10-02 MED ORDER — HYDROCODONE-ACETAMINOPHEN 5-325 MG PO TABS: 2.0000 | ORAL_TABLET | ORAL | Status: DC | PRN

## 2017-10-02 MED ORDER — ASPIRIN 81 MG PO CHEW
81.0000 mg | CHEWABLE_TABLET | Freq: Two times a day (BID) | ORAL | Status: DC
  Administered 2017-10-02 – 2017-10-03 (×2): 81 mg via ORAL
  Filled 2017-10-02 (×2): qty 1

## 2017-10-02 MED ORDER — FENTANYL CITRATE (PF) 100 MCG/2ML IJ SOLN
INTRAMUSCULAR | Status: AC
  Filled 2017-10-02: qty 2

## 2017-10-02 MED ORDER — CEFAZOLIN SODIUM-DEXTROSE 2-4 GM/100ML-% IV SOLN
2.0000 g | Freq: Four times a day (QID) | INTRAVENOUS | Status: AC
  Administered 2017-10-02 – 2017-10-03 (×2): 2 g via INTRAVENOUS
  Filled 2017-10-02 (×2): qty 100

## 2017-10-02 MED ORDER — PROPOFOL 500 MG/50ML IV EMUL
INTRAVENOUS | Status: DC | PRN
  Administered 2017-10-02: 50 ug/kg/min via INTRAVENOUS

## 2017-10-02 MED ORDER — ONDANSETRON HCL 4 MG/2ML IJ SOLN
INTRAMUSCULAR | Status: AC
  Filled 2017-10-02: qty 2

## 2017-10-02 MED ORDER — KETOROLAC TROMETHAMINE 15 MG/ML IJ SOLN
7.5000 mg | Freq: Four times a day (QID) | INTRAMUSCULAR | Status: AC
  Administered 2017-10-02 – 2017-10-03 (×4): 7.5 mg via INTRAVENOUS
  Filled 2017-10-02 (×4): qty 1

## 2017-10-02 MED ORDER — HYDROCODONE-ACETAMINOPHEN 5-325 MG PO TABS
1.0000 | ORAL_TABLET | ORAL | Status: DC | PRN
  Administered 2017-10-02 – 2017-10-03 (×2): 1 via ORAL
  Filled 2017-10-02 (×2): qty 1

## 2017-10-02 MED ORDER — SENNA 8.6 MG PO TABS
2.0000 | ORAL_TABLET | Freq: Every day | ORAL | Status: DC
  Administered 2017-10-02: 22:00:00 17.2 mg via ORAL
  Filled 2017-10-02: qty 2

## 2017-10-02 MED ORDER — ISOPROPYL ALCOHOL 70 % SOLN
Status: AC
  Filled 2017-10-02: qty 480

## 2017-10-02 MED ORDER — MIDAZOLAM HCL 5 MG/5ML IJ SOLN
INTRAMUSCULAR | Status: DC | PRN
  Administered 2017-10-02: 1 mg via INTRAVENOUS

## 2017-10-02 MED ORDER — ONDANSETRON HCL 4 MG PO TABS: 4.0000 mg | ORAL_TABLET | Freq: Four times a day (QID) | ORAL | Status: DC | PRN

## 2017-10-02 MED ORDER — ACETAMINOPHEN 650 MG RE SUPP: 650.0000 mg | RECTAL | Status: DC | PRN

## 2017-10-02 MED ORDER — DOCUSATE SODIUM 100 MG PO CAPS
100.0000 mg | ORAL_CAPSULE | Freq: Two times a day (BID) | ORAL | Status: DC
  Administered 2017-10-02 – 2017-10-03 (×2): 100 mg via ORAL
  Filled 2017-10-02 (×2): qty 1

## 2017-10-02 MED ORDER — MIDAZOLAM HCL 2 MG/2ML IJ SOLN
INTRAMUSCULAR | Status: AC
  Filled 2017-10-02: qty 2

## 2017-10-02 SURGICAL SUPPLY — 56 items
ADH SKN CLS APL DERMABOND .7 (GAUZE/BANDAGES/DRESSINGS) ×1
BAG DECANTER FOR FLEXI CONT (MISCELLANEOUS) IMPLANT
BAG SPEC THK2 15X12 ZIP CLS (MISCELLANEOUS)
BAG ZIPLOCK 12X15 (MISCELLANEOUS) IMPLANT
CAPT HIP TOTAL 2 ×2 IMPLANT
CHLORAPREP W/TINT 26ML (MISCELLANEOUS) ×3 IMPLANT
CLOTH BEACON ORANGE TIMEOUT ST (SAFETY) ×3 IMPLANT
COVER PERINEAL POST (MISCELLANEOUS) ×3 IMPLANT
COVER SURGICAL LIGHT HANDLE (MISCELLANEOUS) ×3 IMPLANT
DECANTER SPIKE VIAL GLASS SM (MISCELLANEOUS) ×5 IMPLANT
DERMABOND ADVANCED (GAUZE/BANDAGES/DRESSINGS) ×2
DERMABOND ADVANCED .7 DNX12 (GAUZE/BANDAGES/DRESSINGS) ×2 IMPLANT
DRAPE SHEET LG 3/4 BI-LAMINATE (DRAPES) ×9 IMPLANT
DRAPE STERI IOBAN 125X83 (DRAPES) ×3 IMPLANT
DRAPE U-SHAPE 47X51 STRL (DRAPES) ×6 IMPLANT
DRSG AQUACEL AG ADV 3.5X10 (GAUZE/BANDAGES/DRESSINGS) ×3 IMPLANT
ELECT PENCIL ROCKER SW 15FT (MISCELLANEOUS) ×3 IMPLANT
ELECT REM PT RETURN 15FT ADLT (MISCELLANEOUS) ×3 IMPLANT
GAUZE SPONGE 4X4 12PLY STRL (GAUZE/BANDAGES/DRESSINGS) ×3 IMPLANT
GLOVE BIO SURGEON STRL SZ8.5 (GLOVE) ×6 IMPLANT
GLOVE BIOGEL PI IND STRL 6.5 (GLOVE) IMPLANT
GLOVE BIOGEL PI IND STRL 7.5 (GLOVE) IMPLANT
GLOVE BIOGEL PI IND STRL 8 (GLOVE) IMPLANT
GLOVE BIOGEL PI IND STRL 8.5 (GLOVE) ×1 IMPLANT
GLOVE BIOGEL PI INDICATOR 6.5 (GLOVE) ×6
GLOVE BIOGEL PI INDICATOR 7.5 (GLOVE) ×4
GLOVE BIOGEL PI INDICATOR 8 (GLOVE) ×4
GLOVE BIOGEL PI INDICATOR 8.5 (GLOVE) ×2
GOWN SPEC L3 XXLG W/TWL (GOWN DISPOSABLE) ×3 IMPLANT
GOWN STRL REUS W/ TWL LRG LVL3 (GOWN DISPOSABLE) IMPLANT
GOWN STRL REUS W/ TWL XL LVL3 (GOWN DISPOSABLE) IMPLANT
GOWN STRL REUS W/TWL LRG LVL3 (GOWN DISPOSABLE) ×6
GOWN STRL REUS W/TWL XL LVL3 (GOWN DISPOSABLE) ×6
HANDPIECE INTERPULSE COAX TIP (DISPOSABLE) ×3
HOLDER FOLEY CATH W/STRAP (MISCELLANEOUS) ×3 IMPLANT
HOOD PEEL AWAY FLYTE STAYCOOL (MISCELLANEOUS) ×14 IMPLANT
MARKER SKIN DUAL TIP RULER LAB (MISCELLANEOUS) ×3 IMPLANT
NDL SPNL 18GX3.5 QUINCKE PK (NEEDLE) ×1 IMPLANT
NEEDLE SPNL 18GX3.5 QUINCKE PK (NEEDLE) ×3 IMPLANT
PACK ANTERIOR HIP CUSTOM (KITS) ×3 IMPLANT
SAW OSC TIP CART 19.5X105X1.3 (SAW) ×3 IMPLANT
SEALER BIPOLAR AQUA 6.0 (INSTRUMENTS) ×3 IMPLANT
SET HNDPC FAN SPRY TIP SCT (DISPOSABLE) ×1 IMPLANT
SUT ETHIBOND NAB CT1 #1 30IN (SUTURE) ×6 IMPLANT
SUT MNCRL AB 3-0 PS2 18 (SUTURE) ×3 IMPLANT
SUT MON AB 2-0 CT1 36 (SUTURE) ×6 IMPLANT
SUT MON AB 2-0 SH 27 (SUTURE) ×3
SUT MON AB 2-0 SH27 (SUTURE) IMPLANT
SUT STRATAFIX PDO 1 14 VIOLET (SUTURE) ×3
SUT STRATFX PDO 1 14 VIOLET (SUTURE) ×1
SUT VIC AB 2-0 CT1 27 (SUTURE) ×3
SUT VIC AB 2-0 CT1 TAPERPNT 27 (SUTURE) ×1 IMPLANT
SUTURE STRATFX PDO 1 14 VIOLET (SUTURE) ×1 IMPLANT
SYR 50ML LL SCALE MARK (SYRINGE) ×3 IMPLANT
TRAY FOLEY CATH 14FRSI W/METER (CATHETERS) ×2 IMPLANT
YANKAUER SUCT BULB TIP 10FT TU (MISCELLANEOUS) ×3 IMPLANT

## 2017-10-02 NOTE — Anesthesia Preprocedure Evaluation (Addendum)
Anesthesia Evaluation  Patient identified by MRN, date of birth, ID band Patient awake    Reviewed: Allergy & Precautions, H&P , NPO status , Patient's Chart, lab work & pertinent test results  Airway Mallampati: III  TM Distance: >3 FB Neck ROM: Full    Dental no notable dental hx. (+) Upper Dentures, Partial Lower, Dental Advisory Given   Pulmonary sleep apnea , former smoker,    Pulmonary exam normal breath sounds clear to auscultation       Cardiovascular hypertension, Pt. on medications  Rhythm:Regular Rate:Normal     Neuro/Psych  Headaches, Anxiety    GI/Hepatic Neg liver ROS, GERD  Medicated and Controlled,  Endo/Other  Hypothyroidism   Renal/GU negative Renal ROS  negative genitourinary   Musculoskeletal  (+) Arthritis , Osteoarthritis,    Abdominal   Peds  Hematology negative hematology ROS (+)   Anesthesia Other Findings   Reproductive/Obstetrics negative OB ROS                            Anesthesia Physical Anesthesia Plan  ASA: II  Anesthesia Plan: Spinal   Post-op Pain Management:    Induction: Intravenous  PONV Risk Score and Plan: 3 and Ondansetron, Dexamethasone and Propofol infusion  Airway Management Planned: Simple Face Mask  Additional Equipment:   Intra-op Plan:   Post-operative Plan:   Informed Consent: I have reviewed the patients History and Physical, chart, labs and discussed the procedure including the risks, benefits and alternatives for the proposed anesthesia with the patient or authorized representative who has indicated his/her understanding and acceptance.   Dental advisory given  Plan Discussed with: CRNA  Anesthesia Plan Comments:        Anesthesia Quick Evaluation

## 2017-10-02 NOTE — Anesthesia Postprocedure Evaluation (Signed)
Anesthesia Post Note  Patient: [name] [name]  Procedure(s) Performed: RIGHT TOTAL HIP ARTHROPLASTY ANTERIOR APPROACH (Right Hip)     Patient location during evaluation: PACU Anesthesia Type: Spinal Level of consciousness: oriented and awake and alert Pain management: pain level controlled Vital Signs Assessment: post-procedure vital signs reviewed and stable Respiratory status: spontaneous breathing, respiratory function stable and patient connected to nasal cannula oxygen Cardiovascular status: blood pressure returned to baseline and stable Postop Assessment: no headache, no backache and no apparent nausea or vomiting Anesthetic complications: no    Last Vitals:  Vitals:   10/02/17 1645 10/02/17 1715  BP: (!) 144/79 115/68  Pulse: (!) 51 (!) 55  Resp: 14 18  Temp: 37.1 C   SpO2: 97% 100%    Last Pain:  Vitals:   10/02/17 1645  TempSrc:   PainSc: 8                  Soha Thorup S

## 2017-10-02 NOTE — Anesthesia Procedure Notes (Signed)
Spinal  Patient location during procedure: OR Start time: 10/02/2017 2:00 PM End time: 10/02/2017 2:06 PM Staffing Anesthesiologist: Fitzgerald, William, MD Resident/CRNA: ,  G, CRNA Performed: resident/CRNA  Preanesthetic Checklist Completed: patient identified, site marked, surgical consent, pre-op evaluation, timeout performed, IV checked, risks and benefits discussed and monitors and equipment checked Spinal Block Patient position: sitting Prep: ChloraPrep Patient monitoring: heart rate, continuous pulse ox and blood pressure Approach: midline Location: L2-3 Injection technique: single-shot Needle Needle type: Pencan  Needle gauge: 24 G Needle length: 9 cm Needle insertion depth: 6 cm Assessment Sensory level: T6 Additional Notes Kit expiration date checked and verified.  Skin anesthetized with 1% lidocaine, paresthesia to left lower leg that resolved, + CSF with no pain on injection with + CSF post injection.  Patient tolerated well.     

## 2017-10-02 NOTE — Transfer of Care (Signed)
Immediate Anesthesia Transfer of Care Note  Patient: DIAVION LABRADOR  Procedure(s) Performed: RIGHT TOTAL HIP ARTHROPLASTY ANTERIOR APPROACH (Right Hip)  Patient Location: PACU  Anesthesia Type:Spinal  Level of Consciousness: sedated  Airway & Oxygen Therapy: Patient Spontanous Breathing and Patient connected to face mask oxygen  Post-op Assessment: Report given to RN and Post -op Vital signs reviewed and stable  Post vital signs: Reviewed and stable  Last Vitals:  Vitals:   10/02/17 1058  BP: (!) 142/70  Pulse: 77  Resp: 16  Temp: 37.6 C  SpO2: 98%    Last Pain:  Vitals:   10/02/17 1114  TempSrc:   PainSc: 8          Complications: No apparent anesthesia complications

## 2017-10-02 NOTE — Op Note (Signed)
OPERATIVE REPORT  SURGEON: Rod Can, MD   ASSISTANT: Nehemiah Massed, PA-C.  PREOPERATIVE DIAGNOSIS: Right hip arthritis.   POSTOPERATIVE DIAGNOSIS: Right hip arthritis.   PROCEDURE: Right total hip arthroplasty, anterior approach.   IMPLANTS: DePuy Tri Lock stem, size 3, hi offset. DePuy Pinnacle Cup, size 50 mm. DePuy Altrx liner, size 32 by 50 mm, neutral. DePuy Biolox ceramic head ball, size 32 + 5 mm. 6.5 mm cancellous bone screw x1.  ANESTHESIA:  Spinal  ESTIMATED BLOOD LOSS:-300 mL    ANTIBIOTICS: 2 g Ancef.  DRAINS: None.  COMPLICATIONS: None.   CONDITION: PACU - hemodynamically stable.   BRIEF CLINICAL NOTE: [name] [name] is a 77 y.o. female with a long-standing history of Right hip arthritis. After failing conservative management, the patient was indicated for total hip arthroplasty. The risks, benefits, and alternatives to the procedure were explained, and the patient elected to proceed.  PROCEDURE IN DETAIL: Surgical site was marked by myself in the pre-op holding area. Once inside the operating room, spinal anesthesia was obtained, and a foley catheter was inserted. The patient was then positioned on the Hana table. All bony prominences were well padded. The hip was prepped and draped in the normal sterile surgical fashion. A time-out was called verifying side and site of surgery. The patient received IV antibiotics within 60 minutes of beginning the procedure.  The direct anterior approach to the hip was performed through the Hueter interval. Lateral femoral circumflex vessels were treated with the Auqumantys. The anterior capsule was exposed and an inverted T capsulotomy was made.The femoral neck cut was made to the level of the templated cut. A corkscrew was placed into the head and the head was removed. The femoral head was found to have eburnated bone. The head was passed to the back table and was measured.  Acetabular exposure was  achieved, and the pulvinar and labrum were excised. Sequential reaming of the acetabulum was then performed up to a size 49 mm reamer. A 50 mm cup was then opened and impacted into place at approximately 40 degrees of abduction and 20 degrees of anteversion. I elected to augment the already acceptable press fit fixation with a single 6.5 mm cancellous bone screw. The final polyethylene liner was impacted into place and acetabular osteophytes were removed.   I then gained femoral exposure taking care to protect the abductors and greater trochanter. This was performed using standard external rotation, extension, and adduction. The capsule was peeled off the inner aspect of the greater trochanter, taking care to preserve the short external rotators. A cookie cutter was used to enter the femoral canal, and then the femoral canal finder was placed. Sequential broaching was performed up to a size 3. Calcar planer was used on the femoral neck remnant. I placed a hi offset neck and a trial head ball. The hip was reduced. Leg lengths and offset were checked fluoroscopically. The hip was dislocated and trial components were removed. The final implants were placed, and the hip was reduced.  Fluoroscopy was used to confirm component position and leg lengths. At 90 degrees of external rotation and full extension, the hip was stable to an anterior directed force.  The wound was copiously irrigated with normal saline using pulse lavage. Marcaine solution was injected into the periarticular soft tissue. The wound was closed in layers using #1 Vicryl and V-Loc for the fascia, 2-0 Vicryl for the subcutaneous fat, 2-0 Monocryl for the deep dermal layer, 3-0 running Monocryl subcuticular stitch, and  Dermabond for the skin. Once the glue was fully dried, an Aquacell Ag dressing was applied. The patient was transported to the recovery room in stable condition. Sponge, needle, and instrument counts were correct at the  end of the case x2. The patient tolerated the procedure well and there were no known complications.  Please note that a surgical assistant was a medical necessity for this procedure to perform it in a safe and expeditious manner. Assistant was necessary to provide appropriate retraction of vital neurovascular structures, to prevent femoral fracture, and to allow for anatomic placement of the prosthesis.

## 2017-10-02 NOTE — Interval H&P Note (Signed)
History and Physical Interval Note:  10/02/2017 1:23 PM  [name] [name]  has presented today for surgery, with the diagnosis of Degenerative joint disease right hip  The various methods of treatment have been discussed with the patient and family. After consideration of risks, benefits and other options for treatment, the patient has consented to  Procedure(s) with comments: RIGHT TOTAL HIP ARTHROPLASTY ANTERIOR APPROACH (Right) - Needs RNFA as a surgical intervention .  The patient's history has been reviewed, patient examined, no change in status, stable for surgery.  I have reviewed the patient's chart and labs.  Questions were answered to the patient's satisfaction.     Hilton Cork Kain Milosevic

## 2017-10-02 NOTE — Discharge Instructions (Signed)
°Dr. Jaqulyn Chancellor °Joint Replacement Specialist °[facility] °[address] °[place], [place] [address] °[phone] ° ° °TOTAL HIP REPLACEMENT POSTOPERATIVE DIRECTIONS ° ° ° °Hip Rehabilitation, Guidelines Following Surgery  ° °WEIGHT BEARING °Weight bearing as tolerated with assist device (walker, cane, etc) as directed, use it as long as suggested by your surgeon or therapist, typically at least 4-6 weeks. ° °The results of a hip operation are greatly improved after range of motion and muscle strengthening exercises. Follow all safety measures which are given to protect your hip. If any of these exercises cause increased pain or swelling in your joint, decrease the amount until you are comfortable again. Then slowly increase the exercises. Call your caregiver if you have problems or questions.  ° °HOME CARE INSTRUCTIONS  °Most of the following instructions are designed to prevent the dislocation of your new hip.  °Remove items at home which could result in a fall. This includes throw rugs or furniture in walking pathways.  °Continue medications as instructed at time of discharge. °· You may have some home medications which will be placed on hold until you complete the course of blood thinner medication. °· You may start showering once you are discharged home. Do not remove your dressing. °Do not put on socks or shoes without following the instructions of your caregivers.   °Sit on chairs with arms. Use the chair arms to help push yourself up when arising.  °Arrange for the use of a toilet seat elevator so you are not sitting low.  °· Walk with walker as instructed.  °You may resume a sexual relationship in one month or when given the OK by your caregiver.  °Use walker as long as suggested by your caregivers.  °You may put full weight on your legs and walk as much as is comfortable. °Avoid periods of inactivity such as sitting longer than an hour when not asleep. This helps prevent  blood clots.  °You may return to work once you are cleared by your surgeon.  °Do not drive a car for 6 weeks or until released by your surgeon.  °Do not drive while taking narcotics.  °Wear elastic stockings for two weeks following surgery during the day but you may remove then at night.  °Make sure you keep all of your appointments after your operation with all of your doctors and caregivers. You should call the office at the above phone number and make an appointment for approximately two weeks after the date of your surgery. °Please pick up a stool softener and laxative for home use as long as you are requiring pain medications. °· ICE to the affected hip every three hours for 30 minutes at a time and then as needed for pain and swelling. Continue to use ice on the hip for pain and swelling from surgery. You may notice swelling that will progress down to the foot and ankle.  This is normal after surgery.  Elevate the leg when you are not up walking on it.   °It is important for you to complete the blood thinner medication as prescribed by your doctor. °· Continue to use the breathing machine which will help keep your temperature down.  It is common for your temperature to cycle up and down following surgery, especially at night when you are not up moving around and exerting yourself.  The breathing machine keeps your lungs expanded and your temperature down. ° °RANGE OF MOTION AND STRENGTHENING EXERCISES  °These exercises are   designed to help you keep full movement of your hip joint. Follow your caregiver's or physical therapist's instructions. Perform all exercises about fifteen times, three times per day or as directed. Exercise both hips, even if you have had only one joint replacement. These exercises can be done on a training (exercise) mat, on the floor, on a table or on a bed. Use whatever works the best and is most comfortable for you. Use music or television while you are exercising so that the exercises  are a pleasant break in your day. This will make your life better with the exercises acting as a break in routine you can look forward to.  °Lying on your back, slowly slide your foot toward your buttocks, raising your knee up off the floor. Then slowly slide your foot back down until your leg is straight again.  °Lying on your back spread your legs as far apart as you can without causing discomfort.  °Lying on your side, raise your upper leg and foot straight up from the floor as far as is comfortable. Slowly lower the leg and repeat.  °Lying on your back, tighten up the muscle in the front of your thigh (quadriceps muscles). You can do this by keeping your leg straight and trying to raise your heel off the floor. This helps strengthen the largest muscle supporting your knee.  °Lying on your back, tighten up the muscles of your buttocks both with the legs straight and with the knee bent at a comfortable angle while keeping your heel on the floor.  ° °SKILLED REHAB INSTRUCTIONS: °If the patient is transferred to a skilled rehab facility following release from the hospital, a list of the current medications will be sent to the facility for the patient to continue.  When discharged from the skilled rehab facility, please have the facility set up the patient's Home Health Physical Therapy prior to being released. Also, the skilled facility will be responsible for providing the patient with their medications at time of release from the facility to include their pain medication and their blood thinner medication. If the patient is still at the rehab facility at time of the two week follow up appointment, the skilled rehab facility will also need to assist the patient in arranging follow up appointment in our office and any transportation needs. ° °MAKE SURE YOU:  °Understand these instructions.  °Will watch your condition.  °Will get help right away if you are not doing well or get worse. ° °Pick up stool softner and  laxative for home use following surgery while on pain medications. °Do not remove your dressing. °The dressing is waterproof--it is OK to take showers. °Continue to use ice for pain and swelling after surgery. °Do not use any lotions or creams on the incision until instructed by your surgeon. °Total Hip Protocol. ° ° °

## 2017-10-02 NOTE — Anesthesia Procedure Notes (Signed)
Date/Time: 10/02/2017 1:56 PM Performed by: Glory Buff, CRNA Oxygen Delivery Method: Simple face mask

## 2017-10-03 ENCOUNTER — Encounter (HOSPITAL_COMMUNITY): Payer: Self-pay | Admitting: *Deleted

## 2017-10-03 ENCOUNTER — Other Ambulatory Visit: Payer: Self-pay

## 2017-10-03 LAB — CBC
HCT: 33.9 % — ABNORMAL LOW (ref 36.0–46.0)
Hemoglobin: 10.9 g/dL — ABNORMAL LOW (ref 12.0–15.0)
MCH: 29.3 pg (ref 26.0–34.0)
MCHC: 32.2 g/dL (ref 30.0–36.0)
MCV: 91.1 fL (ref 78.0–100.0)
Platelets: 227 10*3/uL (ref 150–400)
RBC: 3.72 MIL/uL — ABNORMAL LOW (ref 3.87–5.11)
RDW: 13 % (ref 11.5–15.5)
WBC: 12.2 10*3/uL — ABNORMAL HIGH (ref 4.0–10.5)

## 2017-10-03 LAB — BASIC METABOLIC PANEL
Anion gap: 10 (ref 5–15)
BUN: 19 mg/dL (ref 6–20)
CO2: 21 mmol/L — ABNORMAL LOW (ref 22–32)
Calcium: 8.7 mg/dL — ABNORMAL LOW (ref 8.9–10.3)
Chloride: 107 mmol/L (ref 101–111)
Creatinine, Ser: 0.86 mg/dL (ref 0.44–1.00)
GFR calc Af Amer: >60 mL/min (ref >60)
GFR calc non Af Amer: >60 mL/min (ref >60)
Glucose, Bld: 110 mg/dL — ABNORMAL HIGH (ref 65–99)
Potassium: 4.3 mmol/L (ref 3.5–5.1)
Sodium: 138 mmol/L (ref 135–145)

## 2017-10-03 MED ORDER — DOCUSATE SODIUM 100 MG PO CAPS: 100.0000 mg | ORAL_CAPSULE | Freq: Two times a day (BID) | ORAL | 1 refills | Status: DC

## 2017-10-03 MED ORDER — ONDANSETRON HCL 4 MG PO TABS: 4.0000 mg | ORAL_TABLET | Freq: Four times a day (QID) | ORAL | 0 refills | Status: DC | PRN

## 2017-10-03 MED ORDER — ASPIRIN 81 MG PO CHEW: 81.0000 mg | CHEWABLE_TABLET | Freq: Two times a day (BID) | ORAL | 1 refills | Status: DC

## 2017-10-03 MED ORDER — HYDROCODONE-ACETAMINOPHEN 5-325 MG PO TABS: 1.0000 | ORAL_TABLET | Freq: Four times a day (QID) | ORAL | 0 refills | Status: DC | PRN

## 2017-10-03 MED ORDER — SENNA 8.6 MG PO TABS: 2.0000 | ORAL_TABLET | Freq: Every day | ORAL | 0 refills | Status: DC

## 2017-10-03 NOTE — Progress Notes (Signed)
    Subjective:  Patient reports pain as mild to moderate.  Denies N/V/CP/SOB. No c/o.  Objective:   VITALS:   Vitals:   10/02/17 2110 10/03/17 0045 10/03/17 0337 10/03/17 0501  BP: (!) 116/49 (!) 105/52  (!) 114/59  Pulse: (!) 56 62  (!) 51  Resp: 16 15  16   Temp: 98.5 F (36.9 C) 98.1 F (36.7 C)  98 F (36.7 C)  TempSrc: Oral Oral  Oral  SpO2: 100% 99%  100%  Weight:   87.5 kg (193 lb)   Height:   5\' 5"  (1.651 m)     NAD ABD soft Sensation intact distally Intact pulses distally Dorsiflexion/Plantar flexion intact Incision: dressing C/D/I Compartment soft   Lab Results  Component Value Date   WBC 12.2 (H) 10/03/2017   HGB 10.9 (L) 10/03/2017   HCT 33.9 (L) 10/03/2017   MCV 91.1 10/03/2017   PLT 227 10/03/2017   BMET    Component Value Date/Time   NA 138 10/03/2017 0531   K 4.3 10/03/2017 0531   CL 107 10/03/2017 0531   CO2 21 (L) 10/03/2017 0531   GLUCOSE 110 (H) 10/03/2017 0531   BUN 19 10/03/2017 0531   CREATININE 0.86 10/03/2017 0531   CALCIUM 8.7 (L) 10/03/2017 0531   GFRNONAA >60 10/03/2017 0531   GFRAA >60 10/03/2017 0531     Assessment/Plan: 1 Day Post-Op   Principal Problem:   Osteoarthritis of right hip   WBAT with walker DVT ppx: ASA, SCDs, TEDS PO pain control PT/OT Dispo: D/C home with HEP   Hilton Cork Pallie Swigert 10/03/2017, 9:12 AM   Rod Can, MD Cell [phone]

## 2017-10-03 NOTE — Progress Notes (Signed)
Physical Therapy Treatment Patient Details Name: [name] [name] MRN: [id number] DOB: 05-28-1941 Today's Date: 10/03/2017    History of Present Illness Pt is a 77 year old female s/p R direct anterior THA    PT Comments    Pt ambulated in hallway and practiced safe stair technique.  Pt reports understanding and feels ready for d/c home today.  Provided HEP handout and verbally reviewed exercises with pt.    Follow Up Recommendations  Follow surgeon's recommendation for DC plan and follow-up therapies     Equipment Recommendations  None recommended by PT    Recommendations for Other Services       Precautions / Restrictions Precautions Precautions: None    Mobility  Bed Mobility Overal bed mobility: Needs Assistance Bed Mobility: Supine to Sit     Supine to sit: Supervision        Transfers Overall transfer level: Needs assistance Equipment used: Rolling walker (2 wheeled) Transfers: Sit to/from Stand Sit to Stand: Supervision         General transfer comment: verbal cues for hand placement  Ambulation/Gait Ambulation/Gait assistance: Min guard;Supervision Ambulation Distance (Feet): 160 Feet Assistive device: Rolling walker (2 wheeled) Gait Pattern/deviations: Step-through pattern;Antalgic     General Gait Details: verbal cues for posture, RW positioning   Stairs Stairs: Yes   Stair Management: Two rails;Step to pattern;Forwards Number of Stairs: 3 General stair comments: verbal cues for sequence, safety, technique, performed twice  Wheelchair Mobility    Modified Rankin (Stroke Patients Only)       Balance                                            Cognition Arousal/Alertness: Awake/alert Behavior During Therapy: WFL for tasks assessed/performed Overall Cognitive Status: Within Functional Limits for tasks assessed                                        Exercises     General Comments         Pertinent Vitals/Pain Pain Assessment: 0-10 Pain Score: 2  Pain Location: R hip Pain Descriptors / Indicators: Sore Pain Intervention(s): Limited activity within patient's tolerance;Repositioned;Monitored during session;Ice applied    Home Living Family/patient expects to be discharged to:: Private residence Living Arrangements: Alone Available Help at Discharge: Family;Available 24 hours/day(sister) Type of Home: House Home Access: Stairs to enter   Home Layout: One level Home Equipment: Environmental consultant - 2 wheels;Cane - single point      Prior Function Level of Independence: Independent with assistive device(s)      Comments: has been using SPC   PT Goals (current goals can now be found in the care plan section) Acute Rehab PT Goals PT Goal Formulation: With patient Time For Goal Achievement: 10/10/17 Potential to Achieve Goals: Good Progress towards PT goals: Progressing toward goals    Frequency    7X/week      PT Plan Current plan remains appropriate    Co-evaluation              AM-PAC PT "6 Clicks" Daily Activity  Outcome Measure  Difficulty turning over in bed (including adjusting bedclothes, sheets and blankets)?: A Little Difficulty moving from lying on back to sitting on the side of the bed? : A Little Difficulty  sitting down on and standing up from a chair with arms (e.g., wheelchair, bedside commode, etc,.)?: A Little Help needed moving to and from a bed to chair (including a wheelchair)?: A Little Help needed walking in hospital room?: A Little Help needed climbing 3-5 steps with a railing? : A Little 6 Click Score: 18    End of Session Equipment Utilized During Treatment: Gait belt Activity Tolerance: Patient tolerated treatment well Patient left: in chair;with call bell/phone within reach Nurse Communication: Mobility status PT Visit Diagnosis: Other abnormalities of gait and mobility (R26.89)     Time: 1350-1409 PT Time Calculation (min)  (ACUTE ONLY): 19 min  Charges:  $Gait Training: 8-22 mins                    G Codes:       Carmelia Bake, PT, DPT 10/03/2017 Pager: [phone]  York Ram E 10/03/2017, 2:33 PM

## 2017-10-03 NOTE — Evaluation (Signed)
Physical Therapy Evaluation Patient Details Name: [name] [name] MRN: [id number] DOB: Aug 20, 1940 Today's Date: 10/03/2017   History of Present Illness  Pt is a 77 year old female s/p R direct anterior THA  Clinical Impression  Pt is s/p THA resulting in the deficits listed below (see PT Problem List).  Pt will benefit from skilled PT to increase their independence and safety with mobility to allow discharge to the venue listed below.  Pt ambulated in hallway and performed LE exercises.  Pt plans to d/c home later today with her sister's assist.  Plan to return to ambulate again and practice steps prior to d/c.      Follow Up Recommendations Follow surgeon's recommendation for DC plan and follow-up therapies    Equipment Recommendations  None recommended by PT    Recommendations for Other Services       Precautions / Restrictions Precautions Precautions: None Restrictions Weight Bearing Restrictions: No      Mobility  Bed Mobility Overal bed mobility: Needs Assistance Bed Mobility: Supine to Sit     Supine to sit: Supervision        Transfers Overall transfer level: Needs assistance Equipment used: Rolling walker (2 wheeled) Transfers: Sit to/from Stand Sit to Stand: Min guard         General transfer comment: verbal cues for hand placement  Ambulation/Gait Ambulation/Gait assistance: Min guard Ambulation Distance (Feet): 160 Feet Assistive device: Rolling walker (2 wheeled) Gait Pattern/deviations: Step-to pattern;Step-through pattern;Antalgic     General Gait Details: verbal cues for sequence, posture, RW positioning, able to progress to step through pattern, pt reports feeling as though one leg is shorter then the other, recommended she discuss this with MD if sensation persists  Stairs            Wheelchair Mobility    Modified Rankin (Stroke Patients Only)       Balance                                              Pertinent Vitals/Pain Pain Assessment: 0-10 Pain Score: 3  Pain Location: R hip Pain Descriptors / Indicators: Sore Pain Intervention(s): Limited activity within patient's tolerance;Repositioned;Monitored during session    Home Living Family/patient expects to be discharged to:: Private residence Living Arrangements: Alone Available Help at Discharge: Family;Available 24 hours/day(sister) Type of Home: House Home Access: Stairs to enter   [employer] of Steps: 3 Home Layout: One level Home Equipment: Walker - 2 wheels;Cane - single point      Prior Function Level of Independence: Independent with assistive device(s)         Comments: has been using SPC     Hand Dominance        Extremity/Trunk Assessment        Lower Extremity Assessment Lower Extremity Assessment: RLE deficits/detail RLE Deficits / Details: anticipated post op hip weakness, able to perform ankle pumps, hip grossly 2+/5 throughout       Communication   Communication: No difficulties  Cognition Arousal/Alertness: Awake/alert Behavior During Therapy: WFL for tasks assessed/performed Overall Cognitive Status: Within Functional Limits for tasks assessed                                        General Comments  Exercises Total Joint Exercises Ankle Circles/Pumps: AROM;Both;10 reps Hip ABduction/ADduction: AROM;10 reps;Right;Standing(all standing exercises performed with UE support) Long Arc Quad: AROM;Right;10 reps;Seated Knee Flexion: AROM;10 reps;Right;Standing Marching in Standing: AROM;10 reps;Right;Standing Standing Hip Extension: AROM;10 reps;Standing;Right\ Pt reports some L knee pain limiting standing exercises.   Assessment/Plan    PT Assessment Patient needs continued PT services  PT Problem List Decreased strength;Decreased mobility;Decreased activity tolerance;Decreased balance;Decreased knowledge of use of DME;Pain       PT Treatment  Interventions Stair training;Gait training;DME instruction;Therapeutic activities;Therapeutic exercise;Functional mobility training;Patient/family education    PT Goals (Current goals can be found in the Care Plan section)  Acute Rehab PT Goals PT Goal Formulation: With patient Time For Goal Achievement: 10/10/17 Potential to Achieve Goals: Good    Frequency 7X/week   Barriers to discharge        Co-evaluation               AM-PAC PT "6 Clicks" Daily Activity  Outcome Measure Difficulty turning over in bed (including adjusting bedclothes, sheets and blankets)?: A Little Difficulty moving from lying on back to sitting on the side of the bed? : A Lot Difficulty sitting down on and standing up from a chair with arms (e.g., wheelchair, bedside commode, etc,.)?: Unable Help needed moving to and from a bed to chair (including a wheelchair)?: A Little Help needed walking in hospital room?: A Little Help needed climbing 3-5 steps with a railing? : A Lot 6 Click Score: 14    End of Session Equipment Utilized During Treatment: Gait belt Activity Tolerance: Patient tolerated treatment well Patient left: in chair;with call bell/phone within reach   PT Visit Diagnosis: Other abnormalities of gait and mobility (R26.89)    Time: 4765-4650 PT Time Calculation (min) (ACUTE ONLY): 19 min   Charges:   PT Evaluation $PT Eval Low Complexity: 1 Low     PT G CodesCarmelia Bake, PT, DPT 10/03/2017 Pager: [phone]   York Ram E 10/03/2017, 12:39 PM

## 2017-10-03 NOTE — Progress Notes (Signed)
Discharge planning, no HH needs identified. Plan for HEP, has DME. [phone]

## 2017-10-03 NOTE — Discharge Summary (Signed)
Physician Discharge Summary  Patient ID: [name] [name] MRN: [id number] DOB/AGE: 08-29-1940 77 y.o.  Admit date: 10/02/2017 Discharge date: 10/03/2017  Admission Diagnoses:  Osteoarthritis of right hip  Discharge Diagnoses:  Principal Problem:   Osteoarthritis of right hip   Past Medical History:  Diagnosis Date  . Abdominal pain     Neg ct [place] and mri of back   . Adenomatous colon polyp   . Allergy   . Anemia   . Anxiety   . Colon polyp   . Cough    ent evaluation  . DJD (degenerative joint disease) of lumbar spine   . Estrogen deficiency   . Gastritis   . GERD (gastroesophageal reflux disease)   . Headache(784.0)   . Helicobacter pylori infection 10/13/2007   Qualifier: Diagnosis of  By: Marland Mcalpine    . History of positive PPD 10/16/2011  . HSV-2 infection   . Hyperglycemia   . Hyperlipidemia   . Hypertension   . IBS (irritable bowel syndrome)   . Leg cramps 11/05/2013   Appears to have been from the higher dose of diuretic better on low dose and magnesium.   . Pulsatile tinnitus    had MRA and MPV nl mild carotid dopplers2010  . Shingles 02/09/2011   Right upper face and eyelid but not involving the eye at this point.   . Sleep apnea    does not use c pap  . Thyroid disease   . Vulvitis     Surgeries: Procedure(s): RIGHT TOTAL HIP ARTHROPLASTY ANTERIOR APPROACH on 10/02/2017   Consultants (if any):   Discharged Condition: Improved  Hospital Course: PALMINA CLODFELTER is an 77 y.o. female who was admitted 10/02/2017 with a diagnosis of Osteoarthritis of right hip and went to the operating room on 10/02/2017 and underwent the above named procedures.    She was given perioperative antibiotics:  Anti-infectives (From admission, onward)   Start     Dose/Rate Route Frequency Ordered Stop   10/02/17 2000  acyclovir (ZOVIRAX) tablet 400 mg    Comments:  Take PO BID PRN     400 mg Oral Daily 10/02/17 1804     10/02/17 2000  ceFAZolin (ANCEF) IVPB 2g/100 mL  premix     2 g 200 mL/hr over 30 Minutes Intravenous Every 6 hours 10/02/17 1804 10/03/17 0200   10/02/17 1050  ceFAZolin (ANCEF) IVPB 2g/100 mL premix     2 g 200 mL/hr over 30 Minutes Intravenous On call to O.R. 10/02/17 1050 10/02/17 1438    .  She was given sequential compression devices, early ambulation, and ASA for DVT prophylaxis.  She benefited maximally from the hospital stay and there were no complications.    Recent vital signs:  Vitals:   10/03/17 0045 10/03/17 0501  BP: (!) 105/52 (!) 114/59  Pulse: 62 (!) 51  Resp: 15 16  Temp: 98.1 F (36.7 C) 98 F (36.7 C)  SpO2: 99% 100%    Recent laboratory studies:  Lab Results  Component Value Date   HGB 10.9 (L) 10/03/2017   HGB 12.7 09/25/2017   HGB 11.3 (L) 09/03/2017   Lab Results  Component Value Date   WBC 12.2 (H) 10/03/2017   PLT 227 10/03/2017   Lab Results  Component Value Date   INR 0.9 04/14/2007   Lab Results  Component Value Date   NA 138 10/03/2017   K 4.3 10/03/2017   CL 107 10/03/2017   CO2 21 (L) 10/03/2017  BUN 19 10/03/2017   CREATININE 0.86 10/03/2017   GLUCOSE 110 (H) 10/03/2017    Discharge Medications:   Allergies as of 10/03/2017      Reactions   Lisinopril    REACTION: COUGH   Lyrica [pregabalin] Other (See Comments)   Cns side efffects   Pravastatin Other (See Comments)   Cramps.   Simvastatin    Myalgia and stiffness   Codeine Phosphate    REACTION: itching   Oxycodone Hcl    REACTION: rash      Medication List    TAKE these medications   acyclovir 400 MG tablet Commonly known as:  ZOVIRAX Take PO BID PRN What changed:    how much to take  how to take this  when to take this  additional instructions   aspirin 81 MG chewable tablet Chew 1 tablet (81 mg total) by mouth 2 (two) times daily.   docusate sodium 100 MG capsule Commonly known as:  COLACE Take 1 capsule (100 mg total) by mouth 2 (two) times daily.   fluticasone 50 MCG/ACT nasal  spray Commonly known as:  FLONASE INHALE 2 SPRAYS IN EACH NOSTRIL EVERY DAY FOR RHINITIS What changed:  See the new instructions.   HYDROcodone-acetaminophen 5-325 MG tablet Commonly known as:  NORCO/VICODIN Take 1-2 tablets by mouth every 6 (six) hours as needed.   Magnesium 250 MG Tabs Take 250 mg by mouth 2 (two) times a week.   naproxen sodium 220 MG tablet Commonly known as:  ALEVE Take 440 mg by mouth 2 (two) times daily as needed (for pain.).   ondansetron 4 MG tablet Commonly known as:  ZOFRAN Take 1 tablet (4 mg total) by mouth every 6 (six) hours as needed for nausea.   senna 8.6 MG Tabs tablet Commonly known as:  SENOKOT Take 2 tablets (17.2 mg total) by mouth at bedtime.   SYNTHROID 100 MCG tablet Generic drug:  levothyroxine TAKE 1 TABLET BY MOUTH EVERY DAY   triamterene-hydrochlorothiazide 37.5-25 MG tablet Commonly known as:  MAXZIDE-25 TAKE 1 TABLET BY MOUTH EVERY DAY   TURMERIC COMPLEX/BLACK PEPPER PO Take 1 capsule by mouth 2 (two) times a week.       Diagnostic Studies: Dg Pelvis Portable  Result Date: 10/02/2017 CLINICAL DATA:  Status post right hip replacement. EXAM: PORTABLE PELVIS 1-2 VIEWS COMPARISON:  Intraoperative fluoroscopic images earlier today FINDINGS: Sequelae of recent right total hip arthroplasty are again identified. The prosthetic femoral and acetabular components are normally located on this single AP image. No acute fracture is identified. Postoperative gas is noted in the soft tissues about the right hip. IMPRESSION: Right total hip arthroplasty without evidence of immediate complication. Electronically Signed   By: Logan Bores M.D.   On: 10/02/2017 17:08   Dg C-arm 1-60 Min-no Report  Result Date: 10/02/2017 Fluoroscopy was utilized by the requesting physician.  No radiographic interpretation.   Dg Hip Operative Unilat W Or W/o Pelvis Right  Result Date: 10/02/2017 CLINICAL DATA:  Right hip replacement EXAM: OPERATIVE RIGHT HIP  WITH PELVIS COMPARISON:  None. FLUOROSCOPY TIME:  Radiation Exposure Index (as provided by the fluoroscopic device): Not available If the device does not provide the exposure index: Fluoroscopy Time:  Not available Number of Acquired Images:  2 FINDINGS: Right hip replacement is noted in satisfactory position. No acute bony or soft tissue abnormality is noted. IMPRESSION: Right hip replacement in satisfactory position. Electronically Signed   By: Inez Catalina M.D.   On: 10/02/2017 16:31  Disposition: 01-Home or Self Care  Discharge Instructions    Call MD / Call 911   Complete by:  As directed    If you experience chest pain or shortness of breath, CALL 911 and be transported to the hospital emergency room.  If you develope a fever above 101 F, pus (white drainage) or increased drainage or redness at the wound, or calf pain, call your surgeon's office.   Constipation Prevention   Complete by:  As directed    Drink plenty of fluids.  Prune juice may be helpful.  You may use a stool softener, such as Colace (over the counter) 100 mg twice a day.  Use MiraLax (over the counter) for constipation as needed.   Diet - low sodium heart healthy   Complete by:  As directed    Driving restrictions   Complete by:  As directed    No driving for 6 weeks   Increase activity slowly as tolerated   Complete by:  As directed    Lifting restrictions   Complete by:  As directed    No lifting for 6 weeks   TED hose   Complete by:  As directed    Use stockings (TED hose) for 2 weeks on both leg(s).  You may remove them at night for sleeping.      Follow-up Information    Sabra Sessler, Aaron Edelman, MD. Schedule an appointment as soon as possible for a visit in 2 weeks.   Specialty:  Orthopedic Surgery Why:  For wound re-check Contact information: [address] [place] [address] [phone]            Signed: Hilton Cork Kasra Melvin 10/03/2017, 9:15 AM

## 2017-10-17 DIAGNOSIS — Z471 Aftercare following joint replacement surgery: Secondary | ICD-10-CM | POA: Diagnosis not present

## 2017-11-06 ENCOUNTER — Encounter: Payer: Self-pay | Admitting: Internal Medicine

## 2017-11-12 DIAGNOSIS — Z471 Aftercare following joint replacement surgery: Secondary | ICD-10-CM | POA: Diagnosis not present

## 2017-11-12 DIAGNOSIS — Z96641 Presence of right artificial hip joint: Secondary | ICD-10-CM | POA: Diagnosis not present

## 2017-11-21 DIAGNOSIS — Z6832 Body mass index (BMI) 32.0-32.9, adult: Secondary | ICD-10-CM | POA: Diagnosis not present

## 2017-11-21 DIAGNOSIS — L293 Anogenital pruritus, unspecified: Secondary | ICD-10-CM | POA: Diagnosis not present

## 2017-11-21 DIAGNOSIS — B009 Herpesviral infection, unspecified: Secondary | ICD-10-CM | POA: Diagnosis not present

## 2017-11-21 DIAGNOSIS — N952 Postmenopausal atrophic vaginitis: Secondary | ICD-10-CM | POA: Diagnosis not present

## 2017-11-21 DIAGNOSIS — Z01419 Encounter for gynecological examination (general) (routine) without abnormal findings: Secondary | ICD-10-CM | POA: Diagnosis not present

## 2017-11-21 DIAGNOSIS — N898 Other specified noninflammatory disorders of vagina: Secondary | ICD-10-CM | POA: Diagnosis not present

## 2017-11-27 ENCOUNTER — Encounter: Payer: Medicare Other | Admitting: Internal Medicine

## 2017-12-02 DIAGNOSIS — M1711 Unilateral primary osteoarthritis, right knee: Secondary | ICD-10-CM | POA: Diagnosis not present

## 2017-12-11 ENCOUNTER — Ambulatory Visit (AMBULATORY_SURGERY_CENTER): Payer: Self-pay | Admitting: *Deleted

## 2017-12-11 ENCOUNTER — Other Ambulatory Visit: Payer: Self-pay

## 2017-12-11 VITALS — Ht 65.0 in | Wt 195.2 lb

## 2017-12-11 DIAGNOSIS — Z800 Family history of malignant neoplasm of digestive organs: Secondary | ICD-10-CM

## 2017-12-11 DIAGNOSIS — Z86010 Personal history of colonic polyps: Secondary | ICD-10-CM

## 2017-12-11 NOTE — Progress Notes (Signed)
No egg or soy allergy known to patient  No issues with past sedation with any surgeries  or procedures, no intubation problems  No diet pills per patient No home 02 use per patient  No blood thinners per patient  Pt denies issues with constipation  No A fib or A flutter  EMMI video sent to pt's e mail pt declined   

## 2017-12-12 NOTE — Progress Notes (Signed)
Chief Complaint  Patient presents with  . Nevus    Mole on right outer knee, painful and itchy at times. Pt states that the mole is very sore the past few days.     HPI: [name] [name] 77 y.o.    Has mole area right  lef  For years?  But  On last year  ocass ithcing and recently has had pain and  ? If scale fell off and sore   . Had lightened are arouind this  And ? Changing  No bleeding  Has had hip replacement went well Recent   inj in [place] for DJD  But  Not near that site  No other changes in her vv etc and no redness.    [place] bout  bp and since  Low and ok for 45 + years  ? Can she go off ? She has no se and no symcope . Taking dialy  ROS: See pertinent positives and negatives per HPI.  Past Medical History:  Diagnosis Date  . Abdominal pain     Neg ct [place] and mri of back   . Adenomatous colon polyp   . Allergy   . Anemia   . Anxiety   . Cataract 2013   bilat removed   . Colon polyp   . Cough    ent evaluation  . DJD (degenerative joint disease) of lumbar spine   . Estrogen deficiency   . Gastritis   . GERD (gastroesophageal reflux disease)   . Headache(784.0)   . Helicobacter pylori infection 10/13/2007   Qualifier: Diagnosis of  By: Marland Mcalpine    . History of positive PPD 10/16/2011  . HSV-2 infection   . Hyperglycemia   . Hyperlipidemia    no meds now  . Hypertension   . IBS (irritable bowel syndrome)   . Leg cramps 11/05/2013   Appears to have been from the higher dose of diuretic better on low dose and magnesium.   Marland Kitchen Neuromuscular disorder (HCC)    muscle cramps  . Pulsatile tinnitus    had MRA and MPV nl mild carotid dopplers2010  . Shingles 02/09/2011   Right upper face and eyelid but not involving the eye at this point.   . Sleep apnea    does not use c pap  . Thyroid disease   . Vulvitis     Family History  Problem Relation Age of Onset  . Alzheimer's disease Mother   . Heart attack Father   . Asthma Sister   . Colon cancer Sister    . Heart attack Brother   . Colon polyps Sister   . Colon cancer Sister   . Colon cancer Paternal Aunt   . Thyroid disease Sister        multinodular goiter  . Stomach cancer Neg Hx   . Breast cancer Neg Hx   . Rectal cancer Neg Hx     Social History   Socioeconomic History  . Marital status: Single    Spouse name: widowed.   . Number of children: Y  . Years of education: Not on file  . Highest education level: Not on file  Occupational History  . Occupation: retired.    Comment: [employer]----now retired.    Employer: RETIRED  Social Needs  . Financial resource strain: Not on file  . Food insecurity:    Worry: Not on file    Inability: Not on file  . Transportation needs:  Medical: Not on file    Non-medical: Not on file  Tobacco Use  . Smoking status: Former Smoker    Packs/day: 0.50    Years: 23.00    Pack years: 11.50    Types: Cigarettes    Last attempt to quit: 06/16/1984    Years since quitting: 33.5  . Smokeless tobacco: Never Used  Substance and Sexual Activity  . Alcohol use: No  . Drug use: No  . Sexual activity: Not Currently    Partners: Male    Birth control/protection: Surgical  Lifestyle  . Physical activity:    Days per week: Not on file    Minutes per session: Not on file  . Stress: Not on file  Relationships  . Social connections:    Talks on phone: Not on file    Gets together: Not on file    Attends religious service: Not on file    Active member of club or organization: Not on file    Attends meetings of clubs or organizations: Not on file    Relationship status: Not on file  Other Topics Concern  . Not on file  Social History Narrative   Retired    former smoker    widowed fall 2010 from myeloma. Lives alone.    [place] 1  No pets   Taking care of  59 yo gc and now newborn in the fall     takign time off of child care     Outpatient Medications Prior to Visit  Medication Sig Dispense Refill  . acyclovir (ZOVIRAX) 400 MG tablet Take  PO BID PRN (Patient taking differently: Take 400 mg by mouth daily. Take PO BID PRN) 60 tablet 12  . fluticasone (FLONASE) 50 MCG/ACT nasal spray INHALE 2 SPRAYS IN EACH NOSTRIL EVERY DAY FOR RHINITIS (Patient taking differently: INHALE 2 SPRAYS IN EACH NOSTRIL DAILY AS NEEDED FOR RHINITIS) 16 g 5  . naproxen sodium (ALEVE) 220 MG tablet Take 440 mg by mouth 2 (two) times daily as needed (for pain.).    Marland Kitchen SYNTHROID 100 MCG tablet TAKE 1 TABLET BY MOUTH EVERY DAY 90 tablet 1  . triamterene-hydrochlorothiazide (MAXZIDE-25) 37.5-25 MG tablet TAKE 1 TABLET BY MOUTH EVERY DAY 90 tablet 1  . aspirin 81 MG chewable tablet Chew 1 tablet (81 mg total) by mouth 2 (two) times daily. (Patient not taking: Reported on 12/15/2017) 60 tablet 1  . bisacodyl (DULCOLAX) 5 MG EC tablet Take 5 mg by mouth once. X 4 for colon 5-13    . Black Pepper-Turmeric (TURMERIC COMPLEX/BLACK PEPPER PO) Take 1 capsule by mouth 2 (two) times a week.    . docusate sodium (COLACE) 100 MG capsule Take 1 capsule (100 mg total) by mouth 2 (two) times daily. (Patient not taking: Reported on 12/15/2017) 60 capsule 1  . HYDROcodone-acetaminophen (NORCO/VICODIN) 5-325 MG tablet Take 1-2 tablets by mouth every 6 (six) hours as needed. (Patient not taking: Reported on 12/15/2017) 50 tablet 0  . Magnesium 250 MG TABS Take 250 mg by mouth 2 (two) times a week.    . ondansetron (ZOFRAN) 4 MG tablet Take 1 tablet (4 mg total) by mouth every 6 (six) hours as needed for nausea. (Patient not taking: Reported on 12/15/2017) 20 tablet 0  . polyethylene glycol powder (MIRALAX) powder Take 1 Container by mouth once. 238 grams for colon 5-13    . senna (SENOKOT) 8.6 MG TABS tablet Take 2 tablets (17.2 mg total) by mouth at bedtime. (Patient not  taking: Reported on 12/15/2017) 120 each 0   No facility-administered medications prior to visit.      EXAM:  BP 116/62 (BP Location: Right Arm, Patient Position: Sitting, Cuff Size: Normal)   Pulse 71   Temp 98.8 F  (37.1 C) (Oral)   Wt 197 lb 6.4 oz (89.5 kg)   BMI 32.85 kg/m   Body mass index is 32.85 kg/m.  GENERAL: vitals reviewed and listed above, alert, oriented, appears well hydrated and in no acute distress HEENT: atraumatic, conjunctiva  clear, no obvious abnormalities on inspection of external nose and ears MS: moves all extremities without noticeable focal  Abnormality  Gait a bit antalgic   Knees mild swelling djd right   Skin: lateral distal thigh  ( not on knee) a 3 mm dark diffuse palpable  Skin with  Derm attachment and no redness  Or scaling there is a faint lightening ring around this    Area   superfical VV not painful .  PSYCH: pleasant and cooperative, no obvious depression or anxiety  ASSESSMENT AND PLAN:  Discussed the following assessment and plan:  Pigmented skin lesion of uncertain nature - seems like dermatofibroma but not with hx   of sx  see text  advise  see dermatology for opinoin - Plan: Ambulatory referral to Dermatology  Medication management  Essential hypertension - dsic goals and can do a trial of  dec dose of med and go from there  -Patient advised to return or notify health care team  if symptoms worsen ,persist or new concerns arise.  Patient Instructions  Ok to try    1/2 dose of  BP med   But BP goal is  120/80 and below   .  Not sure  What is the nature .  Of the  [facility]  Spot    Referring you for  Dermatology   Appt.   Looks like a  Benign fibroma  But since changing and causing symptoms we are going  To get evaluation.   Glad you are doing better .  With the Hip.     Standley Brooking. Panosh M.D.

## 2017-12-15 ENCOUNTER — Ambulatory Visit (INDEPENDENT_AMBULATORY_CARE_PROVIDER_SITE_OTHER): Payer: Medicare Other | Admitting: Internal Medicine

## 2017-12-15 ENCOUNTER — Encounter: Payer: Self-pay | Admitting: Internal Medicine

## 2017-12-15 ENCOUNTER — Encounter

## 2017-12-15 VITALS — BP 116/62 | HR 71 | Temp 98.8°F | Wt 197.4 lb

## 2017-12-15 DIAGNOSIS — Z79899 Other long term (current) drug therapy: Secondary | ICD-10-CM | POA: Diagnosis not present

## 2017-12-15 DIAGNOSIS — I10 Essential (primary) hypertension: Secondary | ICD-10-CM

## 2017-12-15 DIAGNOSIS — L819 Disorder of pigmentation, unspecified: Secondary | ICD-10-CM | POA: Diagnosis not present

## 2017-12-15 NOTE — Patient Instructions (Addendum)
Ok to try    1/2 dose of  BP med   But BP goal is  120/80 and below   .  Not sure  What is the nature .  Of the  [facility]  Spot    Referring you for  Dermatology   Appt.   Looks like a  Benign fibroma  But since changing and causing symptoms we are going  To get evaluation.   Glad you are doing better .  With the Hip.

## 2017-12-19 ENCOUNTER — Other Ambulatory Visit: Payer: Self-pay | Admitting: Internal Medicine

## 2017-12-22 ENCOUNTER — Encounter: Payer: Self-pay | Admitting: Internal Medicine

## 2017-12-22 ENCOUNTER — Ambulatory Visit (AMBULATORY_SURGERY_CENTER): Payer: Medicare Other | Admitting: Internal Medicine

## 2017-12-22 ENCOUNTER — Other Ambulatory Visit: Payer: Self-pay

## 2017-12-22 VITALS — BP 129/58 | HR 57 | Temp 98.0°F | Resp 10 | Ht 65.0 in | Wt 195.0 lb

## 2017-12-22 DIAGNOSIS — G4733 Obstructive sleep apnea (adult) (pediatric): Secondary | ICD-10-CM | POA: Diagnosis not present

## 2017-12-22 DIAGNOSIS — D125 Benign neoplasm of sigmoid colon: Secondary | ICD-10-CM

## 2017-12-22 DIAGNOSIS — Z800 Family history of malignant neoplasm of digestive organs: Secondary | ICD-10-CM

## 2017-12-22 DIAGNOSIS — D128 Benign neoplasm of rectum: Secondary | ICD-10-CM

## 2017-12-22 DIAGNOSIS — Z86010 Personal history of colonic polyps: Secondary | ICD-10-CM

## 2017-12-22 DIAGNOSIS — K621 Rectal polyp: Secondary | ICD-10-CM

## 2017-12-22 DIAGNOSIS — D127 Benign neoplasm of rectosigmoid junction: Secondary | ICD-10-CM | POA: Diagnosis not present

## 2017-12-22 DIAGNOSIS — E039 Hypothyroidism, unspecified: Secondary | ICD-10-CM | POA: Diagnosis not present

## 2017-12-22 DIAGNOSIS — D124 Benign neoplasm of descending colon: Secondary | ICD-10-CM | POA: Diagnosis not present

## 2017-12-22 DIAGNOSIS — E669 Obesity, unspecified: Secondary | ICD-10-CM | POA: Diagnosis not present

## 2017-12-22 DIAGNOSIS — I10 Essential (primary) hypertension: Secondary | ICD-10-CM | POA: Diagnosis not present

## 2017-12-22 MED ORDER — SODIUM CHLORIDE 0.9 % IV SOLN: 500.0000 mL | Freq: Once | INTRAVENOUS | Status: DC

## 2017-12-22 NOTE — Progress Notes (Signed)
To recovery, report to RN, VSS. 

## 2017-12-22 NOTE — Patient Instructions (Addendum)
I found and removed 3 tiny polyps.  I will let you know pathology results and when to have another routine colonoscopy by mail and/or My Chart.  I appreciate the opportunity to care for you. Gatha Mayer, MD, FACG   YOU HAD AN ENDOSCOPIC PROCEDURE TODAY AT [facility]:   Refer to the procedure report that was given to you for any specific questions about what was found during the examination.  If the procedure report does not answer your questions, please call your gastroenterologist to clarify.  If you requested that your care partner not be given the details of your procedure findings, then the procedure report has been included in a sealed envelope for you to review at your convenience later.  YOU SHOULD EXPECT: Some feelings of bloating in the abdomen. Passage of more gas than usual.  Walking can help get rid of the air that was put into your GI tract during the procedure and reduce the bloating. If you had a lower endoscopy (such as a colonoscopy or flexible sigmoidoscopy) you may notice spotting of blood in your stool or on the toilet paper. If you underwent a bowel prep for your procedure, you may not have a normal bowel movement for a few days.  Please Note:  You might notice some irritation and congestion in your nose or some drainage.  This is from the oxygen used during your procedure.  There is no need for concern and it should clear up in a day or so.  SYMPTOMS TO REPORT IMMEDIATELY:   Following lower endoscopy (colonoscopy or flexible sigmoidoscopy):  Excessive amounts of blood in the stool  Significant tenderness or worsening of abdominal pains  Swelling of the abdomen that is new, acute  Fever of 100F or higher   For urgent or emergent issues, a gastroenterologist can be reached at any hour by calling [phone].   DIET:  We do recommend a small meal at first, but then you may proceed to your regular diet.  Drink plenty of fluids but you  should avoid alcoholic beverages for 24 hours.  ACTIVITY:  You should plan to take it easy for the rest of today and you should NOT DRIVE or use heavy machinery until tomorrow (because of the sedation medicines used during the test).    FOLLOW UP: Our staff will call the number listed on your records the next business day following your procedure to check on you and address any questions or concerns that you may have regarding the information given to you following your procedure. If we do not reach you, we will leave a message.  However, if you are feeling well and you are not experiencing any problems, there is no need to return our call.  We will assume that you have returned to your regular daily activities without incident.  If any biopsies were taken you will be contacted by phone or by letter within the next 1-3 weeks.  Please call us at [phone] if you have not heard about the biopsies in 3 weeks.    SIGNATURES/CONFIDENTIALITY: You and/or your care partner have signed paperwork which will be entered into your electronic medical record.  These signatures attest to the fact that that the information above on your After Visit Summary has been reviewed and is understood.  Full responsibility of the confidentiality of this discharge information lies with you and/or your care-partner.  Read all handouts given to you buy your recovery room nurse.

## 2017-12-22 NOTE — Progress Notes (Signed)
Called to room to assist during endoscopic procedure.  Patient ID and intended procedure confirmed with present staff. Received instructions for my participation in the procedure from the performing physician.  

## 2017-12-22 NOTE — Op Note (Signed)
[facility] Patient Name: [name] [name] Procedure Date: 12/22/2017 3:51 PM MRN: [id number] Endoscopist: Gatha Mayer , MD Age: 77 Referring MD:  Date of Birth: 03-24-1941 Gender: Female Account #: [id number] Procedure:                Colonoscopy Indications:              Surveillance: Personal history of adenomatous                            polyps on last colonoscopy > 3 years ago Medicines:                Propofol per Anesthesia, Monitored Anesthesia Care Procedure:                Pre-Anesthesia Assessment:                           - Prior to the procedure, a History and Physical                            was performed, and patient medications and                            allergies were reviewed. The patient's tolerance of                            previous anesthesia was also reviewed. The risks                            and benefits of the procedure and the sedation                            options and risks were discussed with the patient.                            All questions were answered, and informed consent                            was obtained. Prior Anticoagulants: The patient has                            taken no previous anticoagulant or antiplatelet                            agents. ASA Grade Assessment: III - A patient with                            severe systemic disease. After reviewing the risks                            and benefits, the patient was deemed in                            satisfactory condition to undergo the procedure.  After obtaining informed consent, the colonoscope                            was passed under direct vision. Throughout the                            procedure, the patient's blood pressure, pulse, and                            oxygen saturations were monitored continuously. The                            Colonoscope was introduced through the anus and       advanced to the the cecum, identified by                            appendiceal orifice and ileocecal valve. The                            colonoscopy was performed without difficulty. The                            patient tolerated the procedure well. The quality                            of the bowel preparation was good. The ileocecal                            valve, appendiceal orifice, and rectum were                            photographed. The bowel preparation used was                            Miralax. Scope In: 4:08:42 PM Scope Out: 4:23:39 PM Scope Withdrawal Time: 0 hours 11 minutes 9 seconds  Total Procedure Duration: 0 hours 14 minutes 57 seconds  Findings:                 The perianal and digital rectal examinations were                            normal.                           Three sessile polyps were found in the rectum,                            sigmoid colon and descending colon. The polyps were                            diminutive in size. These polyps were removed with                            a cold snare. Resection and retrieval  were                            complete. Verification of patient identification                            for the specimen was done. Estimated blood loss was                            minimal.                           A few diverticula were found in the ascending colon.                           The exam was otherwise without abnormality on                            direct and retroflexion views. Complications:            No immediate complications. Estimated Blood Loss:     Estimated blood loss was minimal. Impression:               - Three diminutive polyps in the rectum, in the                            sigmoid colon and in the descending colon, removed                            with a cold snare. Resected and retrieved.                           - Diverticulosis in the ascending colon.                            - The examination was otherwise normal on direct                            and retroflexion views.                           - Personal history of colonic polyps. + FHx CRCA in                            2 sisters Recommendation:           - Patient has a contact number available for                            emergencies. The signs and symptoms of potential                            delayed complications were discussed with the                            patient. Return to normal activities tomorrow.  Written discharge instructions were provided to the                            patient.                           - Resume previous diet.                           - Continue present medications.                           - Repeat colonoscopy is recommended. The                            colonoscopy date will be determined after pathology                            results from today's exam become available for                            review. Gatha Mayer, MD 12/22/2017 4:36:26 PM This report has been signed electronically.

## 2017-12-22 NOTE — Progress Notes (Signed)
Pt's states no medical or surgical changes since previsit or office visit. 

## 2017-12-23 ENCOUNTER — Telehealth: Payer: Self-pay | Admitting: *Deleted

## 2017-12-23 DIAGNOSIS — D239 Other benign neoplasm of skin, unspecified: Secondary | ICD-10-CM | POA: Insufficient documentation

## 2017-12-23 DIAGNOSIS — D2371 Other benign neoplasm of skin of right lower limb, including hip: Secondary | ICD-10-CM | POA: Diagnosis not present

## 2017-12-23 NOTE — Telephone Encounter (Signed)
  Follow up Call-  Call back number 12/22/2017  Post procedure Call Back phone  # [phone]  Permission to leave phone message Yes  Some recent data might be hidden     Patient questions:  Do you have a fever, pain , or abdominal swelling? No. Pain Score  0 *  Have you tolerated food without any problems? Yes.    Have you been able to return to your normal activities? Yes.    Do you have any questions about your discharge instructions: Diet   No. Medications  No. Follow up visit  No.  Do you have questions or concerns about your Care? No.  Actions: * If pain score is 4 or above: No action needed, pain <4.

## 2017-12-24 ENCOUNTER — Telehealth: Payer: Self-pay | Admitting: Internal Medicine

## 2017-12-24 NOTE — Telephone Encounter (Signed)
Patient is s/p colonoscopy on 12/22/17 with polypectomy x 3 with cold snare.  She reports no BM since procedure, but this am she had an urge to have a BM she only passed gas but also had some minor bleeding.  "Very light".  She does report some lower abdominal cramping.  She is tolerating a diet, denies nausea and vomiting.  She is reassured that some minor bleeding after polypectomy is not abnormal . She is also advised to monitor and if she has more then 1/4 cup of rectal bleeding to please call us back or proceed to the emergency room.  She verbalized understanding.

## 2017-12-24 NOTE — Telephone Encounter (Signed)
Agree 

## 2017-12-31 ENCOUNTER — Encounter: Payer: Self-pay | Admitting: Internal Medicine

## 2017-12-31 NOTE — Progress Notes (Signed)
2 adenomas + 1 hyperplastic Recall colon 3 yrs give 2 sisters w/ CRCA and hx polyps

## 2017-12-31 NOTE — Progress Notes (Signed)
2 adenomas + 1 hyoperplastic polyp Recall 2022 My Chart letter

## 2018-01-01 DIAGNOSIS — N952 Postmenopausal atrophic vaginitis: Secondary | ICD-10-CM | POA: Diagnosis not present

## 2018-01-14 DIAGNOSIS — H0589 Other disorders of orbit: Secondary | ICD-10-CM | POA: Diagnosis not present

## 2018-02-05 ENCOUNTER — Telehealth: Payer: Self-pay | Admitting: Internal Medicine

## 2018-02-05 NOTE — Telephone Encounter (Signed)
Patient reports that she is is having hard stools and constipation since the colonoscopy.  She is advised to try Miralax 17 gms 1-2 times a day.  She will call back if she has any additional questions or concerns.

## 2018-02-16 DIAGNOSIS — N952 Postmenopausal atrophic vaginitis: Secondary | ICD-10-CM | POA: Diagnosis not present

## 2018-03-31 DIAGNOSIS — M1711 Unilateral primary osteoarthritis, right knee: Secondary | ICD-10-CM | POA: Diagnosis not present

## 2018-03-31 DIAGNOSIS — M1611 Unilateral primary osteoarthritis, right hip: Secondary | ICD-10-CM | POA: Diagnosis not present

## 2018-04-03 ENCOUNTER — Ambulatory Visit: Payer: Medicare Other

## 2018-04-10 NOTE — Progress Notes (Signed)
Chief Complaint  Patient presents with  . Annual Exam    Thyroid nodule - getting larger / Cough x 1 week, mucus production white to light green. Denies fever.    HPI: [name] [name] 77 y.o. comes in today for  Yearly visit.Since last year had THR feb     Bp has been ok .  No cholesterol med   Thyroid med every day . Thinks its time to recheck the thyroid nodule  aseen dr E in past  Last [place] 2016  Has cough for almost 2 weeks now colored phlegm but no cp sob fever    Had burning  With iv anesthesia? At colon   Uncomfortable and painful at iv.  Ok now    [employer] Date Due  . INFLUENZA VACCINE  12/02/2018 (Originally 03/12/2018)  . COLONOSCOPY  12/22/2020  . TETANUS/TDAP  06/23/2022  . DEXA SCAN  Completed  . PNA vac Low Risk Adult  Completed   Health Maintenance Review LIFESTYLE:  Exercise:   Not recently staionary bike since that  February  Tobacco/ETS:  no Alcohol: no to rare  Sugar beverages:not now  Sleep:  wakening's   11 - 3 am .  Sometimes goes back.  Drug use: no HH:no pets hh 1   Hearing:   ok  Vision:  No limitations at present .   Safety:  Has smoke detector and wears seat belts. . No excess sun exposure. Sees dentist regularly  ROS:  GEN/ HEENT: No fever, significant weight changes sweats headaches vision problems hearing changes, CV/ PULM; No chest pain shortness of breath cough, syncope,edema  change in exercise tolerance. GI /GU: No adominal pain, vomiting, change in bowel habits. No blood in the stool. No significant GU symptoms. SKIN/HEME: ,no acute skin rashes suspicious lesions or bleeding. No lymphadenopathy, nodules, masses.  NEURO/ PSYCH:  No neurologic signs such as weakness numbness. No depression anxiety. IMM/ Allergy: No unusual infections.  Allergy .   REST of 12 system review negative except as per HPI   Past Medical History:  Diagnosis Date  . Abdominal pain     Neg ct [place] and mri of back   . Adenomatous colon  polyp   . Allergy   . Anemia   . Anxiety   . Cataract 2013   bilat removed   . Colon polyp   . Cough    ent evaluation  . DJD (degenerative joint disease) of lumbar spine   . Estrogen deficiency   . Gastritis   . GERD (gastroesophageal reflux disease)   . Headache(784.0)   . Helicobacter pylori infection 10/13/2007   Qualifier: Diagnosis of  By: Marland Mcalpine    . History of positive PPD 10/16/2011  . HSV-2 infection   . Hyperglycemia   . Hyperlipidemia    no meds now  . Hypertension   . IBS (irritable bowel syndrome)   . Leg cramps 11/05/2013   Appears to have been from the higher dose of diuretic better on low dose and magnesium.   Marland Kitchen Neuromuscular disorder (HCC)    muscle cramps  . Pulsatile tinnitus    had MRA and MPV nl mild carotid dopplers2010  . Shingles 02/09/2011   Right upper face and eyelid but not involving the eye at this point.   . Sleep apnea    does not use c pap  . Thyroid disease   . Vulvitis     Family History  Problem Relation Age  of Onset  . Alzheimer's disease Mother   . Heart attack Father   . Asthma Sister   . Colon cancer Sister   . Heart attack Brother   . Colon polyps Sister   . Colon cancer Sister   . Colon cancer Paternal Aunt   . Thyroid disease Sister        multinodular goiter  . Stomach cancer Neg Hx   . Breast cancer Neg Hx   . Rectal cancer Neg Hx     Social History   Socioeconomic History  . Marital status: Single    Spouse name: widowed.   . Number of children: Y  . Years of education: Not on file  . Highest education level: Not on file  Occupational History  . Occupation: retired.    Comment: [employer]----now retired.    Employer: RETIRED  Social Needs  . Financial resource strain: Not on file  . Food insecurity:    Worry: Not on file    Inability: Not on file  . Transportation needs:    Medical: Not on file    Non-medical: Not on file  Tobacco Use  . Smoking status: Former Smoker    Packs/day: 0.50    Years:  23.00    Pack years: 11.50    Types: Cigarettes    Last attempt to quit: 06/16/1984    Years since quitting: 33.8  . Smokeless tobacco: Never Used  Substance and Sexual Activity  . Alcohol use: No  . Drug use: No  . Sexual activity: Not Currently    Partners: Male    Birth control/protection: Surgical  Lifestyle  . Physical activity:    Days per week: Not on file    Minutes per session: Not on file  . Stress: Not on file  Relationships  . Social connections:    Talks on phone: Not on file    Gets together: Not on file    Attends religious service: Not on file    Active member of club or organization: Not on file    Attends meetings of clubs or organizations: Not on file    Relationship status: Not on file  Other Topics Concern  . Not on file  Social History Narrative   Retired    former smoker    widowed fall 2010 from myeloma. Lives alone.    Hhof 1  No pets   Taking care of  83 yo gc and now newborn in the fall     takign time off of child care     Outpatient Encounter Medications as of 04/14/2018  Medication Sig  . acyclovir (ZOVIRAX) 400 MG tablet Take PO BID PRN (Patient taking differently: Take 400 mg by mouth daily. Take PO BID PRN)  . fluticasone (FLONASE) 50 MCG/ACT nasal spray INHALE 2 SPRAYS IN EACH NOSTRIL EVERY DAY FOR RHINITIS  . naproxen sodium (ALEVE) 220 MG tablet Take 440 mg by mouth 2 (two) times daily as needed (for pain.).  Marland Kitchen SYNTHROID 100 MCG tablet TAKE 1 TABLET BY MOUTH EVERY DAY  . triamterene-hydrochlorothiazide (MAXZIDE-25) 37.5-25 MG tablet TAKE 1 TABLET BY MOUTH EVERY DAY   Facility-Administered Encounter Medications as of 04/14/2018  Medication  . 0.9 %  sodium chloride infusion    EXAM:  BP 124/68 (BP Location: Right Arm, Patient Position: Sitting, Cuff Size: Normal)   Pulse 62   Temp 98.3 F (36.8 C) (Oral)   Ht 5' 5.16" (1.655 m)   Wt 202 lb 8 oz (91.9  kg)   BMI 33.54 kg/m   Body mass index is 33.54 kg/m.  Physical Exam: Vital  signs reviewed PNT:IRWE is a well-developed well-nourished alert cooperative   who appears stated age in no acute distress.  Mild congestion and bronchial cough no ntoxic  HEENT: normocephalic atraumatic , Eyes: PERRL EOM's full, conjunctiva clear, Nares: paten,t no deformity discharge or tenderness., Ears: no deformity EAC's clear TMs with normal landmarks. Mouth: clear OP, no lesions, edema.  Moist mucous membranes. Dentition in adequate repair. NECK: supple without  Bruits.  Thyroid partly palpable  CHEST/PULM:  Clear to auscultation and percussion breath sounds equal no wheeze , rales or rhonchi. No chest wall deformities or tenderness. CV: PMI is nondisplaced, S1 S2 no gallops, murmurs, rubs. Peripheral pulses are present .No JVD .  ABDOMEN: Bowel sounds normal nontender  No guard or rebound, no hepato splenomegal no CVA tenderness.   Extremtities:  No clubbing cyanosis or edema, no acute joint swelling or redness no focal atrophy NEURO:  Oriented x3, cranial nerves 3-12 appear to be intact, no obvious focal weakness,gait within normal limits no abnormal reflexes or asymmetrical SKIN: No acute rashes normal turgor, color, no bruising or petechiae. PSYCH: Oriented, good eye contact, no obvious depression anxiety, cognition and judgment appear normal. LN: no cervical axillary inguinal adenopathy No noted deficits in memory, attention, and speech.   Lab Results  Component Value Date   WBC 12.2 (H) 10/03/2017   HGB 10.9 (L) 10/03/2017   HCT 33.9 (L) 10/03/2017   PLT 227 10/03/2017   GLUCOSE 110 (H) 10/03/2017   CHOL 202 (H) 03/19/2017   TRIG 105.0 03/19/2017   HDL 52.10 03/19/2017   LDLDIRECT 131.1 08/09/2009   LDLCALC 128 (H) 03/19/2017   ALT 14 03/19/2017   AST 22 03/19/2017   NA 138 10/03/2017   K 4.3 10/03/2017   CL 107 10/03/2017   CREATININE 0.86 10/03/2017   BUN 19 10/03/2017   CO2 21 (L) 10/03/2017   TSH 0.69 01/20/2017   INR 0.9 04/14/2007   HGBA1C 5.7 (H) 09/03/2017     ASSESSMENT AND PLAN:  Discussed the following assessment and plan:  Essential hypertension - Plan: Basic metabolic panel, CBC with Differential/Platelet, Hemoglobin A1c, Hepatic function panel, Lipid panel, TSH, US THYROID  Medication management - Plan: Basic metabolic panel, CBC with Differential/Platelet, Hemoglobin A1c, Hepatic function panel, Lipid panel, TSH  Hyperglycemia - Plan: Basic metabolic panel, CBC with Differential/Platelet, Hemoglobin A1c, Hepatic function panel, Lipid panel, TSH  Post-surgical hypothyroidism - Plan: Basic metabolic panel, CBC with Differential/Platelet, Hemoglobin A1c, Hepatic function panel, Lipid panel, TSH, US THYROID, CANCELED: US SOFT TISSUE HEAD & NECK (NON-THYROID)  Multinodular goiter - Plan: Basic metabolic panel, CBC with Differential/Platelet, Hemoglobin A1c, Hepatic function panel, Lipid panel, TSH, US THYROID, CANCELED: US SOFT TISSUE HEAD & NECK (NON-THYROID)  Cough, persistent - Plan: Basic metabolic panel, CBC with Differential/Platelet, Hepatic function panel, TSH, DG Chest 2 View  Thyroid nodule - Plan: US THYROID, CANCELED: US SOFT TISSUE HEAD & NECK (NON-THYROID)  Need for influenza vaccination - Plan: Flu vaccine HIGH DOSE PF (Fluzone High dose)  Cough may be  Resolving viral resp infection will follow  X ray today .  Fu for alarm sx  She should disc with anaesthesia or pre ops wherabout the  Se of the iv mds given during colon   Patient Care Team: Panosh, Standley Brooking, MD as PCP - General Jerrell Belfast, MD Leo Grosser Seymour Bars, MD (Obstetrics and Gynecology) Gatha Mayer,  MD (Gastroenterology) Justice Britain, MD as Consulting Physician (Orthopedic Surgery) Monna Fam, MD as Consulting Physician (Ophthalmology)  Patient Instructions     Continue lifestyle intervention healthy eating and exercise . Inform ask Gi  Anesthesia about the burnig sx with anesthesia so as to changes process for next procedure .    Will  notify you  of labs   imaging when available.  If ok  Then yearly check ,  Chest x ray and will order a neck ultrasound check .  Suspect this is a chest cold and will  resolve soon .       Standley Brooking. Panosh M.D.

## 2018-04-13 NOTE — Progress Notes (Addendum)
Subjective:   [name] [name] is a 77 y.o. female who presents for Medicare Annual (Subsequent) preventive examination.  Reports health as "pretty good"  Hip replacement on the right in Feb 2019 Right knee is giving her a problem and had cortisone shot in august Which may impact bs  Per Dr. 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 Regis Bill Cut back on sugary drinks, sone challenged her with no soda or sweets, just water Weight expected gain with hip surgery but A1c improved   Had a family challenge  Are getting 7 to 8000 steps  Walks in the am at the beach; Walked 3 miles and tolerated well, even with hip   BMI 33   Exercise not doing exercise due to pain Does do the stationary bike  Has started walking around her back yard x 4 to 5 times around her yard     Hearing Screening Comments:  States her hearing does not seem as good.    Vision Screening Comments: Dr. Herbert Deaner; vision checks Annually in Feb  Had cataracts removed in 2013;  Does have reading glasses  Diabetic eye exam 09/2017 Dexa 06/2012; -0.2  Advanced directives reviewed last year Is still planning to complete this  Has started preparing her children and noting MD's etc   Feels she has noted improved sleep   There are no preventive care reminders to display for this patient. Colonoscopy 12/2017 - will have another in 3 years in 2022 Mammogram 05/2017 - will repeat in October Dr. Leo Grosser GYN  dexa 06/2012 -0.2      Objective:     Vitals: BP 124/68   Pulse 62   Ht 5\' 5"  (1.651 m)   Wt 202 lb (91.6 kg)   BMI 33.61 kg/m   Body mass index is 33.61 kg/m.  Advanced Directives 04/14/2018 12/22/2017 10/02/2017 09/25/2017 09/03/2017 04/02/2017 09/01/2014  Does Patient Have  a Medical Advance Directive? No No No No No No No  Would patient like information on creating a medical advance directive? - No - Patient declined No - Patient declined No - Patient declined No - Patient declined - -    Tobacco Social History   Tobacco Use  Smoking Status Former Smoker  . Packs/day: 0.50  . Years: 23.00  . Pack years: 11.50  . Types: Cigarettes  . Last attempt to quit: 06/16/1984  . Years since quitting: 33.8  Smokeless Tobacco Never Used     Counseling given: Yes   Clinical Intake:     Past Medical History:  Diagnosis Date  . Abdominal pain     Neg ct [place] and mri of back   . Adenomatous colon polyp   . Allergy   . Anemia   . Anxiety   . Cataract 2013   bilat removed   . Colon polyp   . Cough    ent evaluation  . DJD (degenerative joint disease) of lumbar spine   . Estrogen deficiency   . Gastritis   . GERD (gastroesophageal reflux disease)   . Headache(784.0)   . Helicobacter pylori infection 10/13/2007   Qualifier: Diagnosis of  By: Marland Mcalpine    . History of positive PPD 10/16/2011  . HSV-2 infection   .  Hyperglycemia   . Hyperlipidemia    no meds now  . Hypertension   . IBS (irritable bowel syndrome)   . Leg cramps 11/05/2013   Appears to have been from the higher dose of diuretic better on low dose and magnesium.   Marland Kitchen Neuromuscular disorder (HCC)    muscle cramps  . Pulsatile tinnitus    had MRA and MPV nl mild carotid dopplers2010  . Shingles 02/09/2011   Right upper face and eyelid but not involving the eye at this point.   . Sleep apnea    does not use c pap  . Thyroid disease   . Vulvitis    Past Surgical History:  Procedure Laterality Date  . ABDOMINAL HYSTERECTOMY  1982   fibroids  . COLONOSCOPY  2003- 2102   adenomatous polyps  . FINGER SURGERY     left middle A1pulley release  . isthmuectomy     thyroid surgery  . LUMBAR SPINE SURGERY  05/1989   spine d/t tumor neurofibroma Quentin Cornwall  . POLYPECTOMY    . SHOULDER  ARTHROSCOPY Bilateral    left, right 2017  . THYROIDECTOMY     partial rt  ballen  . TONSILLECTOMY     at age 50.   Marland Kitchen TOTAL HIP ARTHROPLASTY Right 10/02/2017   Procedure: RIGHT TOTAL HIP ARTHROPLASTY ANTERIOR APPROACH;  Surgeon: Rod Can, MD;  Location: [facility];  Service: Orthopedics;  Laterality: Right;  Needs RNFA  . TUBAL LIGATION  1974   Family History  Problem Relation Age of Onset  . Alzheimer's disease Mother   . Heart attack Father   . Asthma Sister   . Colon cancer Sister   . Heart attack Brother   . Colon polyps Sister   . Colon cancer Sister   . Colon cancer Paternal Aunt   . Thyroid disease Sister        multinodular goiter  . Stomach cancer Neg Hx   . Breast cancer Neg Hx   . Rectal cancer Neg Hx    Social History   Socioeconomic History  . Marital status: Single    Spouse name: widowed.   . Number of children: Y  . Years of education: Not on file  . Highest education level: Not on file  Occupational History  . Occupation: retired.    Comment: [employer]----now retired.    Employer: RETIRED  Social Needs  . Financial resource strain: Not on file  . Food insecurity:    Worry: Not on file    Inability: Not on file  . Transportation needs:    Medical: Not on file    Non-medical: Not on file  Tobacco Use  . Smoking status: Former Smoker    Packs/day: 0.50    Years: 23.00    Pack years: 11.50    Types: Cigarettes    Last attempt to quit: 06/16/1984    Years since quitting: 33.8  . Smokeless tobacco: Never Used  Substance and Sexual Activity  . Alcohol use: No  . Drug use: No  . Sexual activity: Not Currently    Partners: Male    Birth control/protection: Surgical  Lifestyle  . Physical activity:    Days per week: Not on file    Minutes per session: Not on file  . Stress: Not on file  Relationships  . Social connections:    Talks on phone: Not on file    Gets together: Not on file    Attends religious service: Not on file  Active member of  club or organization: Not on file    Attends meetings of clubs or organizations: Not on file    Relationship status: Not on file  Other Topics Concern  . Not on file  Social History Narrative   Retired    former smoker    widowed fall 2010 from myeloma. Lives alone.    Hhof 1  No pets   Taking care of  58 yo gc and now newborn in the fall     takign time off of child care     Outpatient Encounter Medications as of 04/14/2018  Medication Sig  . acyclovir (ZOVIRAX) 400 MG tablet Take PO BID PRN (Patient taking differently: Take 400 mg by mouth daily. Take PO BID PRN)  . fluticasone (FLONASE) 50 MCG/ACT nasal spray INHALE 2 SPRAYS IN EACH NOSTRIL EVERY DAY FOR RHINITIS  . naproxen sodium (ALEVE) 220 MG tablet Take 440 mg by mouth 2 (two) times daily as needed (for pain.).  Marland Kitchen SYNTHROID 100 MCG tablet TAKE 1 TABLET BY MOUTH EVERY DAY  . triamterene-hydrochlorothiazide (MAXZIDE-25) 37.5-25 MG tablet TAKE 1 TABLET BY MOUTH EVERY DAY   Facility-Administered Encounter Medications as of 04/14/2018  Medication  . 0.9 %  sodium chloride infusion    Activities of Daily Living In your present state of health, do you have any difficulty performing the following activities: 04/14/2018 10/03/2017  Hearing? Y N  Comment some decreased hearing -  Vision? N N  Difficulty concentrating or making decisions? N N  Comment - -  Walking or climbing stairs? N Y  Comment - -  Dressing or bathing? N N  Doing errands, shopping? N -  Preparing Food and eating ? N -  Using the Toilet? N -  In the past six months, have you accidently leaked urine? N -  Do you have problems with loss of bowel control? N -  Managing your Medications? N -  Managing your Finances? N -  Housekeeping or managing your Housekeeping? N -  Some recent data might be hidden    Patient Care Team: Panosh, Standley Brooking, MD as PCP - General Jerrell Belfast, MD Eldred Manges, MD (Obstetrics and Gynecology) Gatha Mayer, MD  (Gastroenterology) Justice Britain, MD as Consulting Physician (Orthopedic Surgery) Monna Fam, MD as Consulting Physician (Ophthalmology)    Assessment:   This is a routine wellness examination for Xenia.  Exercise Activities and Dietary recommendations Current Exercise Habits: Structured exercise class(goes up and down steps ), Type of exercise: Other - see comments(active every day in house keeping etc), Time (Minutes): 45, Frequency (Times/Week): 5, Weekly Exercise (Minutes/Week): 225, Intensity: Moderate(mild to moderate )  Goals    . Exercise 150 min/wk Moderate Activity     [facility] [employer] since the grands are in day care    . patient     To eat healthier and to [facility] [employer]     . patient     Set your realistic goals in the am; chip away at them. Review at hs/  Goal setting without judgement;  No "should have"   Lower expectations  Take time to rest       Fall Risk Fall Risk  04/14/2018 04/14/2018 04/02/2017 03/19/2017 03/01/2016  Falls in the past year? (No Data) Yes Yes No No  Comment had one emptying trash can, hurt right shoulder but is better now - - - -  Number falls in  past yr: - 1 1 - -  Injury with Fall? - Yes - - -  Comment - shoulder pain following fall - tripped over trash can lid falling to the ground on right shoulder. - - -  Follow up - - Education provided - -    Depression Screen PHQ 2/9 Scores 04/14/2018 04/14/2018 04/02/2017 03/19/2017  PHQ - 2 Score 0 0 1 0  Exception Documentation - - Patient refusal -     Cognitive Function MMSE - Mini Mental State Exam 04/14/2018 04/14/2018  Not completed: (No Data) (No Data)   Ad8 score reviewed for issues:  Issues making decisions:  Less interest in hobbies / activities:  Repeats questions, stories (family complaining):  Trouble using ordinary gadgets (microwave, computer, phone):  Forgets the month or year:   Mismanaging finances:   Remembering appts:  Daily problems  with thinking and/or memory: Ad8 score is=0          Immunization History  Administered Date(s) Administered  . Influenza Split 06/20/2011, 06/23/2012  . Influenza Whole 05/17/2008, 06/11/2010  . Influenza, High Dose Seasonal PF 05/18/2015, 09/05/2016, 06/05/2017, 04/14/2018  . Influenza,inj,Quad PF,6+ Mos 04/08/2013, 05/25/2014  . Influenza-Unspecified 05/12/2017  . Pneumococcal Conjugate-13 11/05/2013  . Pneumococcal Polysaccharide-23 02/05/2008  . Td 08/12/2004  . Tdap 06/23/2012  . Zoster 03/28/2011      Screening Tests Health Maintenance  Topic Date Due  . INFLUENZA VACCINE  12/02/2018 (Originally 03/12/2018)  . COLONOSCOPY  12/22/2020  . TETANUS/TDAP  06/23/2022  . DEXA SCAN  Completed  . PNA vac Low Risk Adult  Completed        Plan:      PCP Notes   Health Maintenance Colonoscopy 12/2017 - will have another in 3 years in 2022 Mammogram 05/2017 - will repeat in October Dr. Leo Grosser GYN  dexa 06/2012 -0.2  Flu vaccine given in clinic  Educated regarding shingrix   Abnormal Screens  none  Referrals  none  Patient concerns; none  Nurse Concerns; As noted  Next PCP apt Was seen today       I have personally reviewed and noted the following in the patient's chart:   . Medical and social history . Use of alcohol, tobacco or illicit drugs  . Current medications and supplements . Functional ability and status . Nutritional status . Physical activity . Advanced directives . List of other physicians . Hospitalizations, surgeries, and ER visits in previous 12 months . Vitals . Screenings to include cognitive, depression, and falls . Referrals and appointments  In addition, I have reviewed and discussed with patient certain preventive protocols, quality metrics, and best practice recommendations. A written personalized care plan for preventive services as well as general preventive health recommendations were provided to patient.      LOVFI,EPPIR, RN  04/14/2018    Above noted reviewed and agree. Shanon Ace, MD

## 2018-04-14 ENCOUNTER — Encounter: Payer: Self-pay | Admitting: Internal Medicine

## 2018-04-14 ENCOUNTER — Ambulatory Visit (INDEPENDENT_AMBULATORY_CARE_PROVIDER_SITE_OTHER): Payer: Medicare Other

## 2018-04-14 ENCOUNTER — Other Ambulatory Visit: Payer: Self-pay | Admitting: Internal Medicine

## 2018-04-14 ENCOUNTER — Ambulatory Visit (INDEPENDENT_AMBULATORY_CARE_PROVIDER_SITE_OTHER): Payer: Medicare Other | Admitting: Internal Medicine

## 2018-04-14 VITALS — BP 124/68 | HR 62 | Temp 98.3°F | Ht 65.16 in | Wt 202.5 lb

## 2018-04-14 VITALS — BP 124/68 | HR 62 | Ht 65.0 in | Wt 202.0 lb

## 2018-04-14 DIAGNOSIS — E042 Nontoxic multinodular goiter: Secondary | ICD-10-CM

## 2018-04-14 DIAGNOSIS — E041 Nontoxic single thyroid nodule: Secondary | ICD-10-CM | POA: Diagnosis not present

## 2018-04-14 DIAGNOSIS — R05 Cough: Secondary | ICD-10-CM

## 2018-04-14 DIAGNOSIS — Z79899 Other long term (current) drug therapy: Secondary | ICD-10-CM

## 2018-04-14 DIAGNOSIS — E890 Postprocedural hypothyroidism: Secondary | ICD-10-CM | POA: Diagnosis not present

## 2018-04-14 DIAGNOSIS — R053 Chronic cough: Secondary | ICD-10-CM

## 2018-04-14 DIAGNOSIS — I517 Cardiomegaly: Secondary | ICD-10-CM

## 2018-04-14 DIAGNOSIS — R739 Hyperglycemia, unspecified: Secondary | ICD-10-CM

## 2018-04-14 DIAGNOSIS — Z0000 Encounter for general adult medical examination without abnormal findings: Secondary | ICD-10-CM

## 2018-04-14 DIAGNOSIS — Z23 Encounter for immunization: Secondary | ICD-10-CM | POA: Diagnosis not present

## 2018-04-14 DIAGNOSIS — R0602 Shortness of breath: Secondary | ICD-10-CM | POA: Diagnosis not present

## 2018-04-14 DIAGNOSIS — I10 Essential (primary) hypertension: Secondary | ICD-10-CM

## 2018-04-14 LAB — BASIC METABOLIC PANEL
BUN: 19 mg/dL (ref 6–23)
CO2: 29 mEq/L (ref 19–32)
Calcium: 9.6 mg/dL (ref 8.4–10.5)
Chloride: 102 mEq/L (ref 96–112)
Creatinine, Ser: 0.82 mg/dL (ref 0.40–1.20)
GFR: 87.1 mL/min (ref >60.00)
Glucose, Bld: 88 mg/dL (ref 70–99)
Potassium: 4.3 mEq/L (ref 3.5–5.1)
Sodium: 140 mEq/L (ref 135–145)

## 2018-04-14 LAB — LIPID PANEL
Cholesterol: 208 mg/dL — ABNORMAL HIGH (ref 0–200)
HDL: 67.7 mg/dL (ref >39.00)
LDL Cholesterol: 124 mg/dL — ABNORMAL HIGH (ref 0–99)
NonHDL: 140.33
Total CHOL/HDL Ratio: 3
Triglycerides: 80 mg/dL (ref 0.0–149.0)
VLDL: 16 mg/dL (ref 0.0–40.0)

## 2018-04-14 LAB — TSH: TSH: 1.04 u[IU]/mL (ref 0.35–4.50)

## 2018-04-14 LAB — CBC WITH DIFFERENTIAL/PLATELET
Basophils Absolute: 0 10*3/uL (ref 0.0–0.1)
Basophils Relative: 0.6 % (ref 0.0–3.0)
Eosinophils Absolute: 0.3 10*3/uL (ref 0.0–0.7)
Eosinophils Relative: 4.8 % (ref 0.0–5.0)
HCT: 38 % (ref 36.0–46.0)
Hemoglobin: 12.5 g/dL (ref 12.0–15.0)
Lymphocytes Relative: 39.1 % (ref 12.0–46.0)
Lymphs Abs: 2.1 10*3/uL (ref 0.7–4.0)
MCHC: 32.9 g/dL (ref 30.0–36.0)
MCV: 88.6 fl (ref 78.0–100.0)
Monocytes Absolute: 0.4 10*3/uL (ref 0.1–1.0)
Monocytes Relative: 7.7 % (ref 3.0–12.0)
Neutro Abs: 2.5 10*3/uL (ref 1.4–7.7)
Neutrophils Relative %: 47.8 % (ref 43.0–77.0)
Platelets: 187 10*3/uL (ref 150.0–400.0)
RBC: 4.29 Mil/uL (ref 3.87–5.11)
RDW: 14.2 % (ref 11.5–15.5)
WBC: 5.3 10*3/uL (ref 4.0–10.5)

## 2018-04-14 LAB — HEPATIC FUNCTION PANEL
ALT: 19 U/L (ref 0–35)
AST: 23 U/L (ref 0–37)
Albumin: 4.2 g/dL (ref 3.5–5.2)
Alkaline Phosphatase: 54 U/L (ref 39–117)
Bilirubin, Direct: 0.1 mg/dL (ref 0.0–0.3)
Total Bilirubin: 0.5 mg/dL (ref 0.2–1.2)
Total Protein: 7.1 g/dL (ref 6.0–8.3)

## 2018-04-14 LAB — HEMOGLOBIN A1C: Hgb A1c MFr Bld: 6 % (ref 4.6–6.5)

## 2018-04-14 NOTE — Patient Instructions (Addendum)
    Continue lifestyle intervention healthy eating and exercise . Inform ask Gi  Anesthesia about the burnig sx with anesthesia so as to changes process for next procedure .    Will notify you  of labs   imaging when available.  If ok  Then yearly check ,  Chest x ray and will order a neck ultrasound check .  Suspect this is a chest cold and will  resolve soon .

## 2018-04-14 NOTE — Patient Instructions (Addendum)
[name] [name] , Thank you for taking time to come for your Medicare Wellness Visit. I appreciate your ongoing commitment to your health goals. Please review the following plan we discussed and let me know if I can assist you in the future.   Shingrix is a vaccine for the prevention of Shingles in Adults 50 and older.  If you are on Medicare, the shingrix is covered under your Part D plan, so you will take both of the vaccines in the series at your pharmacy. Please check with your benefits regarding applicable copays or out of pocket expenses.  The Shingrix is given in 2 vaccines approx 8 weeks apart. You must receive the 2nd dose prior to 6 months from receipt of the first. Please have the pharmacist print out you Immunization  dates for our office records    These are the goals we discussed: Goals    . Exercise 150 min/wk Moderate Activity     [facility] in [facility] care    . patient     To eat healthier and to [facility]     . patient     Set your realistic goals in the am; chip away at them. Review at hs/  Goal setting without judgement;  No "should have"   Lower expectations  Take time to rest       This is a list of the screening recommended for you and due dates:  Health Maintenance  Topic Date Due  . Flu Shot  03/12/2018  . Colon Cancer Screening  12/22/2020  . Tetanus Vaccine  06/23/2022  . DEXA scan (bone density measurement)  Completed  . Pneumonia vaccines  Completed      Fall Prevention in the Home Falls can cause injuries. They can happen to people of all ages. There are many things you can do to make your home safe and to help prevent falls. What can I do on the outside of my home?  Regularly fix the edges of walkways and driveways and fix any cracks.  Remove anything that might make you trip as you walk through a door, such as a raised step or threshold.  Trim any bushes or trees on the path to your  home.  Use bright outdoor lighting.  Clear any walking paths of anything that might make someone trip, such as rocks or tools.  Regularly check to see if handrails are loose or broken. Make sure that both sides of any steps have handrails.  Any raised decks and porches should have guardrails on the edges.  Have any leaves, snow, or ice cleared regularly.  Use sand or salt on walking paths during winter.  Clean up any spills in your garage right away. This includes oil or grease spills. What can I do in the bathroom?  Use night lights.  Install grab bars by the toilet and in the tub and shower. Do not use towel bars as grab bars.  Use non-skid mats or decals in the tub or shower.  If you need to sit down in the shower, use a plastic, non-slip stool.  Keep the floor dry. Clean up any water that spills on the floor as soon as it happens.  Remove soap buildup in the tub or shower regularly.  Attach bath mats securely with double-sided non-slip rug tape.  Do not have throw rugs and other things on the floor that can make  you trip. What can I do in the bedroom?  Use night lights.  Make sure that you have a light by your bed that is easy to reach.  Do not use any sheets or blankets that are too big for your bed. They should not hang down onto the floor.  Have a firm chair that has side arms. You can use this for support while you get dressed.  Do not have throw rugs and other things on the floor that can make you trip. What can I do in the kitchen?  Clean up any spills right away.  Avoid walking on wet floors.  Keep items that you use a lot in easy-to-reach places.  If you need to reach something above you, use a strong step stool that has a grab bar.  Keep electrical cords out of the way.  Do not use floor polish or wax that makes floors slippery. If you must use wax, use non-skid floor wax.  Do not have throw rugs and other things on the floor that can make you  trip. What can I do with my stairs?  Do not leave any items on the stairs.  Make sure that there are handrails on both sides of the stairs and use them. Fix handrails that are broken or loose. Make sure that handrails are as long as the stairways.  Check any carpeting to make sure that it is firmly attached to the stairs. Fix any carpet that is loose or worn.  Avoid having throw rugs at the top or bottom of the stairs. If you do have throw rugs, attach them to the floor with carpet tape.  Make sure that you have a light switch at the top of the stairs and the bottom of the stairs. If you do not have them, ask someone to add them for you. What else can I do to help prevent falls?  Wear shoes that: ? Do not have high heels. ? Have rubber bottoms. ? Are comfortable and fit you well. ? Are closed at the toe. Do not wear sandals.  If you use a stepladder: ? Make sure that it is fully opened. Do not climb a closed stepladder. ? Make sure that both sides of the stepladder are locked into place. ? Ask someone to hold it for you, if possible.  Clearly mark and make sure that you can see: ? Any grab bars or handrails. ? First and last steps. ? Where the edge of each step is.  Use tools that help you move around (mobility aids) if they are needed. These include: ? Canes. ? Walkers. ? Scooters. ? Crutches.  Turn on the lights when you go into a dark area. Replace any light bulbs as soon as they burn out.  Set up your furniture so you have a clear path. Avoid moving your furniture around.  If any of your floors are uneven, fix them.  If there are any pets around you, be aware of where they are.  Review your medicines with your doctor. Some medicines can make you feel dizzy. This can increase your chance of falling. Ask your doctor what other things that you can do to help prevent falls. This information is not intended to replace advice given to you by your health care provider. Make  sure you discuss any questions you have with your health care provider. Document Released: 05/25/2009 Document Revised: 01/04/2016 Document Reviewed: 09/02/2014 Elsevier Interactive Patient Education  2018 [place]  Maintenance, Female Adopting a healthy lifestyle and getting preventive care can go a long way to promote health and wellness. Talk with your health care provider about what schedule of regular examinations is right for you. This is a good chance for you to check in with your provider about disease prevention and staying healthy. In between checkups, there are plenty of things you can do on your own. Experts have done a lot of research about which lifestyle changes and preventive measures are most likely to keep you healthy. Ask your health care provider for more information. Weight and diet Eat a healthy diet  Be sure to include plenty of vegetables, fruits, low-fat dairy products, and lean protein.  Do not eat a lot of foods high in solid fats, added sugars, or salt.  Get regular exercise. This is one of the most important things you can do for your health. ? Most adults should exercise for at least 150 minutes each week. The exercise should increase your heart rate and make you sweat (moderate-intensity exercise). ? Most adults should also do strengthening exercises at least twice a week. This is in addition to the moderate-intensity exercise.  Maintain a healthy weight  Body mass index (BMI) is a measurement that can be used to identify possible weight problems. It estimates body fat based on height and weight. Your health care provider can help determine your BMI and help you achieve or maintain a healthy weight.  For females 46 years of age and older: ? A BMI below 18.5 is considered underweight. ? A BMI of 18.5 to 24.9 is normal. ? A BMI of 25 to 29.9 is considered overweight. ? A BMI of 30 and above is considered obese.  Watch levels of cholesterol and  blood lipids  You should start having your blood tested for lipids and cholesterol at 76 years of age, then have this test every 5 years.  You may need to have your cholesterol levels checked more often if: ? Your lipid or cholesterol levels are high. ? You are older than 76 years of age. ? You are at high risk for heart disease.  Cancer screening Lung Cancer  Lung cancer screening is recommended for adults 61-24 years old who are at high risk for lung cancer because of a history of smoking.  A yearly low-dose CT scan of the lungs is recommended for people who: ? Currently smoke. ? Have quit within the past 15 years. ? Have at least a 30-pack-year history of smoking. A pack year is smoking an average of one pack of cigarettes a day for 1 year.  Yearly screening should continue until it has been 15 years since you quit.  Yearly screening should stop if you develop a health problem that would prevent you from having lung cancer treatment.  Breast Cancer  Practice breast self-awareness. This means understanding how your breasts normally appear and feel.  It also means doing regular breast self-exams. Let your health care provider know about any changes, no matter how small.  If you are in your 20s or 30s, you should have a clinical breast exam (CBE) by a health care provider every 1-3 years as part of a regular health exam.  If you are 82 or older, have a CBE every year. Also consider having a breast X-ray (mammogram) every year.  If you have a family history of breast cancer, talk to your health care provider about genetic screening.  If you are at high risk for  breast cancer, talk to your health care provider about having an MRI and a mammogram every year.  Breast cancer gene (BRCA) assessment is recommended for women who have family members with BRCA-related cancers. BRCA-related cancers include: ? Breast. ? Ovarian. ? Tubal. ? Peritoneal cancers.  Results of the assessment  will determine the need for genetic counseling and BRCA1 and BRCA2 testing.  Cervical Cancer Your health care provider may recommend that you be screened regularly for cancer of the pelvic organs (ovaries, uterus, and vagina). This screening involves a pelvic examination, including checking for microscopic changes to the surface of your cervix (Pap test). You may be encouraged to have this screening done every 3 years, beginning at age 42.  For women ages 68-65, health care providers may recommend pelvic exams and Pap testing every 3 years, or they may recommend the Pap and pelvic exam, combined with testing for human papilloma virus (HPV), every 5 years. Some types of HPV increase your risk of cervical cancer. Testing for HPV may also be done on women of any age with unclear Pap test results.  Other health care providers may not recommend any screening for nonpregnant women who are considered low risk for pelvic cancer and who do not have symptoms. Ask your health care provider if a screening pelvic exam is right for you.  If you have had past treatment for cervical cancer or a condition that could lead to cancer, you need Pap tests and screening for cancer for at least 20 years after your treatment. If Pap tests have been discontinued, your risk factors (such as having a new sexual partner) need to be reassessed to determine if screening should resume. Some women have medical problems that increase the chance of getting cervical cancer. In these cases, your health care provider may recommend more frequent screening and Pap tests.  Colorectal Cancer  This type of cancer can be detected and often prevented.  Routine colorectal cancer screening usually begins at 77 years of age and continues through 76 years of age.  Your health care provider may recommend screening at an earlier age if you have risk factors for colon cancer.  Your health care provider may also recommend using home test kits to  check for hidden blood in the stool.  A small camera at the end of a tube can be used to examine your colon directly (sigmoidoscopy or colonoscopy). This is done to check for the earliest forms of colorectal cancer.  Routine screening usually begins at age 33.  Direct examination of the colon should be repeated every 5-10 years through 76 years of age. However, you may need to be screened more often if early forms of precancerous polyps or small growths are found.  Skin Cancer  Check your skin from head to toe regularly.  Tell your health care provider about any new moles or changes in moles, especially if there is a change in a mole's shape or color.  Also tell your health care provider if you have a mole that is larger than the size of a pencil eraser.  Always use sunscreen. Apply sunscreen liberally and repeatedly throughout the day.  Protect yourself by wearing long sleeves, pants, a wide-brimmed hat, and sunglasses whenever you are outside.  Heart disease, diabetes, and high blood pressure  High blood pressure causes heart disease and increases the risk of stroke. High blood pressure is more likely to develop in: ? People who have blood pressure in the high end of the  normal range (130-139/85-89 mm Hg). ? People who are overweight or obese. ? People who are African American.  If you are 36-57 years of age, have your blood pressure checked every 3-5 years. If you are 32 years of age or older, have your blood pressure checked every year. You should have your blood pressure measured twice-once when you are at a hospital or clinic, and once when you are not at a hospital or clinic. Record the average of the two measurements. To check your blood pressure when you are not at a hospital or clinic, you can use: ? An automated blood pressure machine at a pharmacy. ? A home blood pressure monitor.  If you are between 62 years and 1 years old, ask your health care provider if you should  take aspirin to prevent strokes.  Have regular diabetes screenings. This involves taking a blood sample to check your fasting blood sugar level. ? If you are at a normal weight and have a low risk for diabetes, have this test once every three years after 76 years of age. ? If you are overweight and have a high risk for diabetes, consider being tested at a younger age or more often. Preventing infection Hepatitis B  If you have a higher risk for hepatitis B, you should be screened for this virus. You are considered at high risk for hepatitis B if: ? You were born in a country where hepatitis B is common. Ask your health care provider which countries are considered high risk. ? Your parents were born in a high-risk country, and you have not been immunized against hepatitis B (hepatitis B vaccine). ? You have HIV or AIDS. ? You use needles to inject street drugs. ? You live with someone who has hepatitis B. ? You have had sex with someone who has hepatitis B. ? You get hemodialysis treatment. ? You take certain medicines for conditions, including cancer, organ transplantation, and autoimmune conditions.  Hepatitis C  Blood testing is recommended for: ? Everyone born from [birth year] through 1964. ? Anyone with known risk factors for hepatitis C.  Sexually transmitted infections (STIs)  You should be screened for sexually transmitted infections (STIs) including gonorrhea and chlamydia if: ? You are sexually active and are younger than 76 years of age. ? You are older than 76 years of age and your health care provider tells you that you are at risk for this type of infection. ? Your sexual activity has changed since you were last screened and you are at an increased risk for chlamydia or gonorrhea. Ask your health care provider if you are at risk.  If you do not have HIV, but are at risk, it may be recommended that you take a prescription medicine daily to prevent HIV infection. This is called  pre-exposure prophylaxis (PrEP). You are considered at risk if: ? You are sexually active and do not regularly use condoms or know the HIV status of your partner(s). ? You take drugs by injection. ? You are sexually active with a partner who has HIV.  Talk with your health care provider about whether you are at high risk of being infected with HIV. If you choose to begin PrEP, you should first be tested for HIV. You should then be tested every 3 months for as long as you are taking PrEP. Pregnancy  If you are premenopausal and you may become pregnant, ask your health care provider about preconception counseling.  If you may become pregnant,  take 400 to 800 micrograms (mcg) of folic acid every day.  If you want to prevent pregnancy, talk to your health care provider about birth control (contraception). Osteoporosis and menopause  Osteoporosis is a disease in which the bones lose minerals and strength with aging. This can result in serious bone fractures. Your risk for osteoporosis can be identified using a bone density scan.  If you are 18 years of age or older, or if you are at risk for osteoporosis and fractures, ask your health care provider if you should be screened.  Ask your health care provider whether you should take a calcium or vitamin D supplement to lower your risk for osteoporosis.  Menopause may have certain physical symptoms and risks.  Hormone replacement therapy may reduce some of these symptoms and risks. Talk to your health care provider about whether hormone replacement therapy is right for you. Follow these instructions at home:  Schedule regular health, dental, and eye exams.  Stay current with your immunizations.  Do not use any tobacco products including cigarettes, chewing tobacco, or electronic cigarettes.  If you are pregnant, do not drink alcohol.  If you are breastfeeding, limit how much and how often you drink alcohol.  Limit alcohol intake to no more  than 1 drink per day for nonpregnant women. One drink equals 12 ounces of beer, 5 ounces of wine, or 1 ounces of hard liquor.  Do not use street drugs.  Do not share needles.  Ask your health care provider for help if you need support or information about quitting drugs.  Tell your health care provider if you often feel depressed.  Tell your health care provider if you have ever been abused or do not feel safe at home. This information is not intended to replace advice given to you by your health care provider. Make sure you discuss any questions you have with your health care provider. Document Released: 02/11/2011 Document Revised: 01/04/2016 Document Reviewed: 05/02/2015 Elsevier Interactive Patient Education  [employer].

## 2018-04-16 ENCOUNTER — Other Ambulatory Visit: Payer: Self-pay

## 2018-04-16 ENCOUNTER — Ambulatory Visit (HOSPITAL_COMMUNITY): Payer: Medicare Other | Attending: Internal Medicine

## 2018-04-16 DIAGNOSIS — I517 Cardiomegaly: Secondary | ICD-10-CM | POA: Insufficient documentation

## 2018-04-16 DIAGNOSIS — I340 Nonrheumatic mitral (valve) insufficiency: Secondary | ICD-10-CM | POA: Diagnosis not present

## 2018-04-17 ENCOUNTER — Encounter: Payer: Self-pay | Admitting: *Deleted

## 2018-04-21 ENCOUNTER — Ambulatory Visit
Admission: RE | Admit: 2018-04-21 | Discharge: 2018-04-21 | Disposition: A | Discharge status: Home / Self Care | Payer: Medicare Other | Source: Ambulatory Visit | Attending: Internal Medicine | Admitting: Internal Medicine

## 2018-04-21 DIAGNOSIS — E042 Nontoxic multinodular goiter: Secondary | ICD-10-CM

## 2018-04-21 DIAGNOSIS — I10 Essential (primary) hypertension: Secondary | ICD-10-CM

## 2018-04-21 DIAGNOSIS — E041 Nontoxic single thyroid nodule: Secondary | ICD-10-CM | POA: Diagnosis not present

## 2018-04-21 DIAGNOSIS — E890 Postprocedural hypothyroidism: Secondary | ICD-10-CM

## 2018-04-29 ENCOUNTER — Ambulatory Visit (INDEPENDENT_AMBULATORY_CARE_PROVIDER_SITE_OTHER): Payer: Medicare Other | Admitting: Physician Assistant

## 2018-04-29 ENCOUNTER — Encounter: Payer: Self-pay | Admitting: Physician Assistant

## 2018-04-29 ENCOUNTER — Telehealth: Payer: Self-pay | Admitting: Internal Medicine

## 2018-04-29 VITALS — BP 138/72 | Ht 65.0 in | Wt 202.0 lb

## 2018-04-29 DIAGNOSIS — R194 Change in bowel habit: Secondary | ICD-10-CM | POA: Diagnosis not present

## 2018-04-29 DIAGNOSIS — R1032 Left lower quadrant pain: Secondary | ICD-10-CM

## 2018-04-29 DIAGNOSIS — R1031 Right lower quadrant pain: Secondary | ICD-10-CM | POA: Diagnosis not present

## 2018-04-29 MED ORDER — DICYCLOMINE HCL 20 MG PO TABS: 20.0000 mg | ORAL_TABLET | Freq: Three times a day (TID) | ORAL | 1 refills | Status: DC

## 2018-04-29 NOTE — Telephone Encounter (Signed)
Patient has a one week history of diarrhea, watery.  She reports now that she has diarrhea and pain with all meals. She will come in today and see Ellouise Newer, PA at 2:45

## 2018-04-29 NOTE — Patient Instructions (Addendum)
We have sent the following medications to your pharmacy for you to pick up at your convenience: Dicyclomine 10 mg four times a day 20-30 minutes before meals and at bedtime.

## 2018-04-29 NOTE — Progress Notes (Signed)
Chief Complaint: Lower abdominal cramping, change in bowel habits  HPI:    [name] [name] is a 77 year old female with a past medical history as listed below, known to Dr. Carlean Purl, who presents to clinic today with a complaint of lower abdominal cramping and a change in bowel habits.    12/22/2017 colonoscopy with 3 diminutive polyps in the rectum, sigmoid colon and descending colon, diverticulosis in the ascending colon otherwise normal exam.  Repeat was recommended in 3 years due to a finding of adenomatous colon polyps and a family history of colon cancer.    Today, patient tells me that she has always battled IBS but typically this was just some softer stools when she would eat certain things that would come urgently after eating.  She tells me now that since 04/22/2018 she has had lower abdominal cramping and the softer stools.  Describes that her stools are soft and formed not runny but they do occur urgently after eating anything over the past week.  Anytime she eats she will develop lower abdominal cramping within an hour afterwards which typically results in a soft solid stool and then will be resolved until she tries to eat or drink anything else.  She has been taking Pepto-Bismol which has not been helping.   Does describe being around family and a young nephew that had similar symptoms.  Associated symptoms include one wave of nausea during these episodes.    Denies fever, chills, blood in her stool, weight loss, anorexia, vomiting, heartburn, reflux or symptoms that awaken her from sleep.  Past Medical History:  Diagnosis Date  . Abdominal pain     Neg ct [place] and mri of back   . Adenomatous colon polyp   . Allergy   . Anemia   . Anxiety   . Cataract 2013   bilat removed   . Colon polyp   . Cough    ent evaluation  . DJD (degenerative joint disease) of lumbar spine   . Estrogen deficiency   . Gastritis   . GERD (gastroesophageal reflux disease)   . Headache(784.0)   .  Helicobacter pylori infection 10/13/2007   Qualifier: Diagnosis of  By: Marland Mcalpine    . History of positive PPD 10/16/2011  . HSV-2 infection   . Hyperglycemia   . Hyperlipidemia    no meds now  . Hypertension   . IBS (irritable bowel syndrome)   . Leg cramps 11/05/2013   Appears to have been from the higher dose of diuretic better on low dose and magnesium.   Marland Kitchen Neuromuscular disorder (HCC)    muscle cramps  . Pulsatile tinnitus    had MRA and MPV nl mild carotid dopplers2010  . Shingles 02/09/2011   Right upper face and eyelid but not involving the eye at this point.   . Sleep apnea    does not use c pap  . Thyroid disease   . Vulvitis     Past Surgical History:  Procedure Laterality Date  . ABDOMINAL HYSTERECTOMY  1982   fibroids  . COLONOSCOPY  2003- 2102   adenomatous polyps  . FINGER SURGERY     left middle A1pulley release  . isthmuectomy     thyroid surgery  . LUMBAR SPINE SURGERY  05/1989   spine d/t tumor neurofibroma Quentin Cornwall  . POLYPECTOMY    . SHOULDER ARTHROSCOPY Bilateral    left, right 2017  . THYROIDECTOMY     partial rt  ballen  . TONSILLECTOMY  at age 65.   Marland Kitchen TOTAL HIP ARTHROPLASTY Right 10/02/2017   Procedure: RIGHT TOTAL HIP ARTHROPLASTY ANTERIOR APPROACH;  Surgeon: Rod Can, MD;  Location: [facility];  Service: Orthopedics;  Laterality: Right;  Needs RNFA  . TUBAL LIGATION  1974    Current Outpatient Medications  Medication Sig Dispense Refill  . acyclovir (ZOVIRAX) 400 MG tablet Take PO BID PRN (Patient taking differently: Take 400 mg by mouth daily. ) 60 tablet 12  . fluticasone (FLONASE) 50 MCG/ACT nasal spray INHALE 2 SPRAYS IN EACH NOSTRIL EVERY DAY FOR RHINITIS (Patient taking differently: Place 2 sprays into both nostrils daily as needed. ) 16 g 5  . naproxen sodium (ALEVE) 220 MG tablet Take 440 mg by mouth 2 (two) times daily as needed (for pain.).    Marland Kitchen SYNTHROID 100 MCG tablet TAKE 1 TABLET BY MOUTH EVERY DAY 90 tablet 1  .  triamterene-hydrochlorothiazide (MAXZIDE-25) 37.5-25 MG tablet TAKE 1 TABLET BY MOUTH EVERY DAY 90 tablet 1   No current facility-administered medications for this visit.     Allergies as of 04/29/2018 - Review Complete 04/29/2018  Allergen Reaction Noted  . Lisinopril  08/28/2006  . Lyrica [pregabalin] Other (See Comments) 02/09/2011  . Pravastatin Other (See Comments) 04/08/2013  . Simvastatin  08/27/2012  . Codeine phosphate  07/22/2005  . Oxycodone hcl  08/28/2006  . Strawberry extract  12/11/2017    Family History  Problem Relation Age of Onset  . Alzheimer's disease Mother   . Heart attack Father   . Asthma Sister   . Colon cancer Sister   . Heart attack Brother   . Colon polyps Sister   . Colon cancer Sister   . Colon cancer Paternal Aunt   . Thyroid disease Sister        multinodular goiter  . Stomach cancer Neg Hx   . Breast cancer Neg Hx   . Rectal cancer Neg Hx     Social History   Socioeconomic History  . Marital status: Single    Spouse name: widowed.   . Number of children: Y  . Years of education: Not on file  . Highest education level: Not on file  Occupational History  . Occupation: retired.    Comment: [employer]----now retired.    Employer: RETIRED  Social Needs  . Financial resource strain: Not on file  . Food insecurity:    Worry: Not on file    Inability: Not on file  . Transportation needs:    Medical: Not on file    Non-medical: Not on file  Tobacco Use  . Smoking status: Former Smoker    Packs/day: 0.50    Years: 23.00    Pack years: 11.50    Types: Cigarettes    Last attempt to quit: 06/16/1984    Years since quitting: 33.8  . Smokeless tobacco: Never Used  Substance and Sexual Activity  . Alcohol use: No  . Drug use: No  . Sexual activity: Not Currently    Partners: Male    Birth control/protection: Surgical  Lifestyle  . Physical activity:    Days per week: Not on file    Minutes per session: Not on file  . Stress: Not on  file  Relationships  . Social connections:    Talks on phone: Not on file    Gets together: Not on file    Attends religious service: Not on file    Active member of club or organization: Not on file  Attends meetings of clubs or organizations: Not on file    Relationship status: Not on file  . Intimate partner violence:    Fear of current or ex partner: Not on file    Emotionally abused: Not on file    Physically abused: Not on file    Forced sexual activity: Not on file  Other Topics Concern  . Not on file  Social History Narrative   Retired    former smoker    widowed fall 2010 from myeloma. Lives alone.    [address]  No pets   Taking care of  60 yo gc and now newborn in the fall     takign time off of child care     Review of Systems:    Constitutional: No weight loss, fever or chills Cardiovascular: No chest pain Respiratory: No SOB Gastrointestinal: See HPI and otherwise negative   Physical Exam:  Vital signs: BP 138/72   Ht 5\' 5"  (1.651 m)   Wt 202 lb (91.6 kg)   BMI 33.61 kg/m   Constitutional:   Pleasant AA female appears to be in NAD, Well developed, Well nourished, alert and cooperative Respiratory: Respirations even and unlabored. Lungs clear to auscultation bilaterally.   No wheezes, crackles, or rhonchi.  Cardiovascular: Normal S1, S2. No MRG. Regular rate and rhythm. No peripheral edema, cyanosis or pallor.  Gastrointestinal:  Soft, nondistended,mild b/l lower ttp. No rebound or guarding. Normal bowel sounds. No appreciable masses or hepatomegaly. Psychiatric: Demonstrates good judgement and reason without abnormal affect or behaviors.  MOST RECENT LABS AND IMAGING: CBC    Component Value Date/Time   WBC 5.3 04/14/2018 1033   RBC 4.29 04/14/2018 1033   HGB 12.5 04/14/2018 1033   HCT 38.0 04/14/2018 1033   PLT 187.0 04/14/2018 1033   MCV 88.6 04/14/2018 1033   MCH 29.3 10/03/2017 0531   MCHC 32.9 04/14/2018 1033   RDW 14.2 04/14/2018 1033    LYMPHSABS 2.1 04/14/2018 1033   MONOABS 0.4 04/14/2018 1033   EOSABS 0.3 04/14/2018 1033   BASOSABS 0.0 04/14/2018 1033    CMP     Component Value Date/Time   NA 140 04/14/2018 1033   K 4.3 04/14/2018 1033   CL 102 04/14/2018 1033   CO2 29 04/14/2018 1033   GLUCOSE 88 04/14/2018 1033   BUN 19 04/14/2018 1033   CREATININE 0.82 04/14/2018 1033   CALCIUM 9.6 04/14/2018 1033   PROT 7.1 04/14/2018 1033   ALBUMIN 4.2 04/14/2018 1033   AST 23 04/14/2018 1033   ALT 19 04/14/2018 1033   ALKPHOS 54 04/14/2018 1033   BILITOT 0.5 04/14/2018 1033   GFRNONAA >60 10/03/2017 0531   GFRAA >60 10/03/2017 0531    Assessment: 1.  Lower abdominal cramping: Which results in soft stool, always after eating; consistent with IBS, possibly post-infectious after around her nephew with illness 2.  Change in bowel habits: Towards a softer, yet still formed stool which occurs urgently after cramping above; consistent with IBS  Plan: 1.  Prescribed Dicyclomine 10 mg 4 times daily, 20 to 30 minutes before meals and at bedtime #120 with 1 refill 2.  Offered the patient antiemetics which she declines today as she thinks she had a reaction to them in the past. 3.  Discussed with patient that she should use Dicyclomine as above over the next month, at time of re-check we will see if she needs to stay on these or not. 4.  Patient to follow in clinic  with me in 4-6 weeks.  Ellouise Newer, PA-C [facility] 04/29/2018, 3:00 PM  Cc: Burnis Medin, MD

## 2018-04-30 ENCOUNTER — Telehealth: Payer: Self-pay | Admitting: Internal Medicine

## 2018-04-30 NOTE — Telephone Encounter (Signed)
Copied from [place] [id number]. Topic: General - Call Back - No Documentation >> Apr 30, 2018  2:10 PM Reyne Dumas L wrote: Reason for CRM:   Pt states she is returning a call to the nurse for Dr. Regis Bill

## 2018-05-01 ENCOUNTER — Other Ambulatory Visit: Payer: Self-pay | Admitting: Internal Medicine

## 2018-05-01 DIAGNOSIS — E041 Nontoxic single thyroid nodule: Secondary | ICD-10-CM

## 2018-05-01 NOTE — Telephone Encounter (Signed)
See result note.  

## 2018-05-05 ENCOUNTER — Other Ambulatory Visit: Payer: Self-pay | Admitting: Internal Medicine

## 2018-05-05 DIAGNOSIS — E041 Nontoxic single thyroid nodule: Secondary | ICD-10-CM

## 2018-05-14 ENCOUNTER — Ambulatory Visit
Admission: RE | Admit: 2018-05-14 | Discharge: 2018-05-14 | Disposition: A | Discharge status: Home / Self Care | Payer: Medicare Other | Source: Ambulatory Visit | Attending: Internal Medicine | Admitting: Internal Medicine

## 2018-05-14 ENCOUNTER — Other Ambulatory Visit (HOSPITAL_COMMUNITY)
Admission: RE | Admit: 2018-05-14 | Discharge: 2018-05-14 | Disposition: A | Discharge status: Home / Self Care | Payer: Medicare Other | Source: Ambulatory Visit | Attending: Physician Assistant | Admitting: Physician Assistant

## 2018-05-14 DIAGNOSIS — E041 Nontoxic single thyroid nodule: Secondary | ICD-10-CM | POA: Insufficient documentation

## 2018-05-14 NOTE — Procedures (Signed)
PROCEDURE SUMMARY:  Using direct ultrasound guidance, 5 passes were made using 25 g needles into the nodule within the left lobe of the thyroid.   Ultrasound was used to confirm needle placements on all occasions.   Specimens were sent to Pathology for analysis.  See procedure note under Imaging tab in Epic for full procedure details.  WENDY S BLAIR PA-C 05/14/2018 3:58 PM

## 2018-05-18 ENCOUNTER — Other Ambulatory Visit: Payer: Self-pay | Admitting: Internal Medicine

## 2018-05-18 DIAGNOSIS — Z1231 Encounter for screening mammogram for malignant neoplasm of breast: Secondary | ICD-10-CM

## 2018-05-21 ENCOUNTER — Telehealth: Payer: Self-pay | Admitting: Family Medicine

## 2018-05-21 NOTE — Telephone Encounter (Signed)
Pt notified that Dr. Regis Bill is not in the office until the 17th.  Dr. Mamie Nick will review the results when she returns and pt will receive a return call.  Pt would like a message sent so she will receive a return call.

## 2018-05-21 NOTE — Telephone Encounter (Signed)
Will send to Dr Regis Bill to advise upon her return.

## 2018-05-21 NOTE — Telephone Encounter (Signed)
Copied from [place] [id number]. Topic: General - Other >> May 21, 2018 11:02 AM Marin Olp L wrote: Reason for CRM: Patient would like a call back RE thyroid biopsy results.

## 2018-05-28 NOTE — Telephone Encounter (Signed)
Pt is wondering if there is any updated information she can have on her biopsy report.

## 2018-05-28 NOTE — Telephone Encounter (Signed)
Please get infor to patient seen on  result note about benign lesion  Of thyroid nodule

## 2018-05-29 NOTE — Telephone Encounter (Signed)
See result notes for biopsy  Notes recorded by Burnis Medin, MD on 05/28/2018 at 3:08 PM EDT Good news Results show benign Nodule( please send her a copy of results if cannot see on my chart  Would repeat US of thyroid in 12- 24 month to make sure Stable  mychart message sent. Nothing further needed.

## 2018-05-29 NOTE — Telephone Encounter (Signed)
Spoke with patient regarding results.  Discussed results with patient. Nothing further needed.

## 2018-05-29 NOTE — Telephone Encounter (Signed)
Pt called back-advised her that a mychart message was sent-pt is requesting call back from [place] to discuss further, if possible.

## 2018-06-10 ENCOUNTER — Ambulatory Visit (INDEPENDENT_AMBULATORY_CARE_PROVIDER_SITE_OTHER): Payer: Medicare Other | Admitting: Internal Medicine

## 2018-06-10 ENCOUNTER — Encounter: Payer: Self-pay | Admitting: Internal Medicine

## 2018-06-10 VITALS — BP 130/70 | HR 76 | Wt 205.8 lb

## 2018-06-10 DIAGNOSIS — E739 Lactose intolerance, unspecified: Secondary | ICD-10-CM | POA: Diagnosis not present

## 2018-06-10 DIAGNOSIS — K580 Irritable bowel syndrome with diarrhea: Secondary | ICD-10-CM | POA: Diagnosis not present

## 2018-06-10 DIAGNOSIS — Z86010 Personal history of colonic polyps: Secondary | ICD-10-CM

## 2018-06-10 NOTE — Patient Instructions (Signed)
  We are giving you a FODMAP diet handout to read and follow.   We are also giving you a Lactose-free diet handout to read over.   Take your dicyclomine when you have your attacks.    I appreciate the opportunity to care for you. Silvano Rusk, MD, [facility]

## 2018-06-10 NOTE — Progress Notes (Signed)
[name] [name] 77 y.o. 07-04-1941 [id number]  Assessment & Plan:   Encounter Diagnoses  Name Primary?  . Irritable bowel syndrome with diarrhea Yes  . Lactose intolerance    She is improved.  I reviewed IBS with her.  I think she probably had an infectious diarrhea in September and explained that in IBS patients symptoms are often more pronounced and of longer duration.  We talked about FODMAPs diet.  She is not interested in pursuing a dietitian intervention but will look at the FODMAPs list and avoid foods that bother her.  I think she is already figured out grapes and apples.  Lactose-free is recommended and a handout provided.  Lactaid milk.  See me as needed otherwise.  Anticipate repeat colonoscopy in 2022 because of a history of polyps and a family history of colon cancer.   I appreciate the opportunity to care for this patient. CC: Panosh, Standley Brooking, MD  Subjective:   Chief Complaint: Follow-up of abdominal pain and bowel habit problems  HPI Patient is here for follow-up after being seen in September by Ellouise Newer, PA-C with diarrhea and abdominal cramping.  Her grandchildren had been sick.  Those symptoms have abated but she has mild intermittent cramping and at times.  She does have irritable bowel syndrome.  She has found that if she eats grapes and apples she will be gassy and bloated.  She also has lactose intolerance and used to be able to drink some milk but now cannot tolerate 2% milk that is not lactose-free without gas and cramps.  Small amounts of ice cream other than peach ice cream are okay.  She has not been taking dicyclomine regularly since her acute illness exacerbation.  Bowel movements are soft at times.  Reports that she had severe burning with the propofol at her colonoscopy earlier this year and would prefer not to have that again at least the burning, possibly change medications. Allergies  Allergen Reactions  . Lisinopril     REACTION: COUGH  .  Lyrica [Pregabalin] Other (See Comments)    Cns side efffects  . Pravastatin Other (See Comments)    Cramps.  . Simvastatin     Myalgia and stiffness  . Codeine Phosphate     REACTION: itching  . Oxycodone Hcl     REACTION: rash  . Strawberry Extract    Current Meds  Medication Sig  . acyclovir (ZOVIRAX) 400 MG tablet Take PO BID PRN (Patient taking differently: Take 400 mg by mouth daily. )  . dicyclomine (BENTYL) 20 MG tablet Take 1 tablet (20 mg total) by mouth 4 (four) times daily -  before meals and at bedtime.  . fluticasone (FLONASE) 50 MCG/ACT nasal spray INHALE 2 SPRAYS IN EACH NOSTRIL EVERY DAY FOR RHINITIS (Patient taking differently: Place 2 sprays into both nostrils daily as needed. )  . naproxen sodium (ALEVE) 220 MG tablet Take 440 mg by mouth 2 (two) times daily as needed (for pain.).  Marland Kitchen SYNTHROID 100 MCG tablet TAKE 1 TABLET BY MOUTH EVERY DAY  . triamterene-hydrochlorothiazide (MAXZIDE-25) 37.5-25 MG tablet TAKE 1 TABLET BY MOUTH EVERY DAY   Past Medical History:  Diagnosis Date  . Abdominal pain     Neg ct [place] and mri of back   . Adenomatous colon polyp   . Allergy   . Anemia   . Anxiety   . Cataract 2013   bilat removed   . Colon polyp   . Cough  ent evaluation  . DJD (degenerative joint disease) of lumbar spine   . Estrogen deficiency   . Gastritis   . GERD (gastroesophageal reflux disease)   . Headache(784.0)   . Helicobacter pylori infection 10/13/2007   Qualifier: Diagnosis of  By: Marland Mcalpine    . History of positive PPD 10/16/2011  . HSV-2 infection   . Hyperglycemia   . Hyperlipidemia    no meds now  . Hypertension   . IBS (irritable bowel syndrome)   . Leg cramps 11/05/2013   Appears to have been from the higher dose of diuretic better on low dose and magnesium.   . Medication side effect - Propofol (burning vein) and prvastatin (myalgia) 04/08/2013   Pravastatin patient went off and rechallenged and symptoms recurred muscle cramps  /foot cramps   . Neuromuscular disorder (HCC)    muscle cramps  . Pulsatile tinnitus    had MRA and MPV nl mild carotid dopplers2010  . Shingles 02/09/2011   Right upper face and eyelid but not involving the eye at this point.   . Sleep apnea    does not use c pap  . Thyroid disease   . Vulvitis    Past Surgical History:  Procedure Laterality Date  . ABDOMINAL HYSTERECTOMY  1982   fibroids  . COLONOSCOPY  2003- 2102   adenomatous polyps  . FINGER SURGERY     left middle A1pulley release  . isthmuectomy     thyroid surgery  . LUMBAR SPINE SURGERY  05/1989   spine d/t tumor neurofibroma Quentin Cornwall  . POLYPECTOMY    . SHOULDER ARTHROSCOPY Bilateral    left, right 2017  . THYROIDECTOMY     partial rt  ballen  . TONSILLECTOMY     at age 3.   Marland Kitchen TOTAL HIP ARTHROPLASTY Right 10/02/2017   Procedure: RIGHT TOTAL HIP ARTHROPLASTY ANTERIOR APPROACH;  Surgeon: Rod Can, MD;  Location: [facility];  Service: Orthopedics;  Laterality: Right;  Needs RNFA  . TUBAL LIGATION  1974   Social History   Social History Narrative   Retired    former smoker    widowed fall 2010 from myeloma. Lives alone.    [address]  No pets   Taking care of  70 yo gc and now newborn in the fall     takign time off of child care    family history includes Alzheimer's disease in her mother; Asthma in her sister; Colon cancer in her paternal aunt, sister, and sister; Colon polyps in her sister; Heart attack in her brother and father; Thyroid disease in her sister.   Review of Systems As above  Objective:   Physical Exam BP 130/70   Pulse 76   Wt 205 lb 12.8 oz (93.4 kg)   BMI 34.25 kg/m  Developed well-nourished elderly black woman in no acute distress  15 minutes time spent with patient > half in counseling coordination of care

## 2018-06-19 ENCOUNTER — Ambulatory Visit: Payer: Medicare Other

## 2018-06-24 ENCOUNTER — Ambulatory Visit
Admission: RE | Admit: 2018-06-24 | Discharge: 2018-06-24 | Disposition: A | Discharge status: Home / Self Care | Payer: Medicare Other | Source: Ambulatory Visit | Attending: Internal Medicine | Admitting: Internal Medicine

## 2018-06-24 DIAGNOSIS — Z1231 Encounter for screening mammogram for malignant neoplasm of breast: Secondary | ICD-10-CM

## 2018-07-05 ENCOUNTER — Other Ambulatory Visit: Payer: Self-pay

## 2018-07-05 ENCOUNTER — Encounter (HOSPITAL_COMMUNITY): Payer: Self-pay | Admitting: Emergency Medicine

## 2018-07-05 ENCOUNTER — Emergency Department (HOSPITAL_COMMUNITY)
Admission: EM | Admit: 2018-07-05 | Discharge: 2018-07-05 | Disposition: A | Discharge status: Home / Self Care | Payer: Medicare Other | Attending: Emergency Medicine | Admitting: Emergency Medicine

## 2018-07-05 ENCOUNTER — Emergency Department (HOSPITAL_COMMUNITY): Payer: Medicare Other

## 2018-07-05 DIAGNOSIS — Z79899 Other long term (current) drug therapy: Secondary | ICD-10-CM | POA: Insufficient documentation

## 2018-07-05 DIAGNOSIS — Z87891 Personal history of nicotine dependence: Secondary | ICD-10-CM | POA: Diagnosis not present

## 2018-07-05 DIAGNOSIS — M545 Low back pain, unspecified: Secondary | ICD-10-CM

## 2018-07-05 DIAGNOSIS — Y92410 Unspecified street and highway as the place of occurrence of the external cause: Secondary | ICD-10-CM | POA: Insufficient documentation

## 2018-07-05 DIAGNOSIS — Y9389 Activity, other specified: Secondary | ICD-10-CM | POA: Insufficient documentation

## 2018-07-05 DIAGNOSIS — Y999 Unspecified external cause status: Secondary | ICD-10-CM | POA: Diagnosis not present

## 2018-07-05 DIAGNOSIS — I10 Essential (primary) hypertension: Secondary | ICD-10-CM | POA: Diagnosis not present

## 2018-07-05 DIAGNOSIS — Z96641 Presence of right artificial hip joint: Secondary | ICD-10-CM | POA: Diagnosis not present

## 2018-07-05 MED ORDER — ACETAMINOPHEN 500 MG PO TABS
1000.0000 mg | ORAL_TABLET | Freq: Once | ORAL | Status: AC
  Administered 2018-07-05: 1000 mg via ORAL
  Filled 2018-07-05: qty 2

## 2018-07-05 NOTE — ED Notes (Signed)
Pt instructed to change into gown.
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
## 2018-07-05 NOTE — Discharge Instructions (Signed)
You will hurt worse tomorrow this is normal.  Return for abdominal pain blood in your urine or shortness of breath.  Follow-up with your family doctor.  Tylenol and ibuprofen for pain.

## 2018-07-05 NOTE — ED Notes (Signed)
Patient transported to X-ray 
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
## 2018-07-15 ENCOUNTER — Encounter: Payer: Self-pay | Admitting: Internal Medicine

## 2018-07-15 ENCOUNTER — Ambulatory Visit (INDEPENDENT_AMBULATORY_CARE_PROVIDER_SITE_OTHER): Payer: Medicare Other | Admitting: Internal Medicine

## 2018-07-15 VITALS — BP 142/80 | HR 72 | Temp 98.4°F | Wt 208.5 lb

## 2018-07-15 DIAGNOSIS — Z9889 Other specified postprocedural states: Secondary | ICD-10-CM | POA: Diagnosis not present

## 2018-07-15 DIAGNOSIS — S39012A Strain of muscle, fascia and tendon of lower back, initial encounter: Secondary | ICD-10-CM | POA: Diagnosis not present

## 2018-07-15 NOTE — Patient Instructions (Addendum)
I think this is a back strain that should get better over time   .  May take  6 weeks    But if not getting better in another  2 +weeks or if worse contact us and  Can opinion appt  from dr Vertell Limber ( referral)   Since  You have had  Back surgery and  arthristi in the past.   Sounds like the knee is  More problematic .   Follow through with that treatment.    Lumbosacral Strain Lumbosacral strain is an injury that causes pain in the lower back (lumbosacral spine). This injury usually occurs from overstretching the muscles or ligaments along your spine. A strain can affect one or more muscles or cord-like tissues that connect bones to other bones (ligaments). What are the causes? This condition may be caused by:  A hard, direct hit (blow) to the back.  Excessive stretching of the lower back muscles. This may result from: ? A fall. ? Lifting something heavy. ? Repetitive movements such as bending or crouching.  What increases the risk? The following factors may increase your risk of getting this condition:  Participating in sports or activities that involve: ? A sudden twist of the back. ? Pushing or pulling motions.  Being overweight or obese.  Having poor strength and flexibility, especially tight hamstrings or weak muscles in the back or abdomen.  Having too much of a curve in the lower back.  Having a pelvis that is tilted forward.  What are the signs or symptoms? The main symptom of this condition is pain in the lower back, at the site of the strain. Pain may extend (radiate) down one or both legs. How is this diagnosed? This condition is diagnosed based on:  Your symptoms.  Your medical history.  A physical exam. ? Your health care provider may push on certain areas of your back to determine the source of your pain. ? You may be asked to bend forward, backward, and side to side to assess the severity of your pain and your range of motion.  Imaging tests, such  as: ? X-rays. ? MRI.  How is this treated? Treatment for this condition may include:  Putting heat and cold on the affected area.  Medicines to help relieve pain and relax your muscles (muscle relaxants).  NSAIDs to help reduce swelling and discomfort.  When your symptoms improve, it is important to gradually return to your normal routine as soon as possible to reduce pain, avoid stiffness, and avoid loss of muscle strength. Generally, symptoms should improve within 6 weeks of treatment. However, recovery time varies. Follow these instructions at home: Managing pain, stiffness, and swelling   If directed, put ice on the injured area during the first 24 hours after your strain. ? Put ice in a plastic bag. ? Place a towel between your skin and the bag. ? Leave the ice on for 20 minutes, 2-3 times a day.  If directed, put heat on the affected area as often as told by your health care provider. Use the heat source that your health care provider recommends, such as a moist heat pack or a heating pad. ? Place a towel between your skin and the heat source. ? Leave the heat on for 20-30 minutes. ? Remove the heat if your skin turns bright red. This is especially important if you are unable to feel pain, heat, or cold. You may have a greater risk of getting burned. Activity  Rest and return to your normal activities as told by your health care provider. Ask your health care provider what activities are safe for you.  Avoid activities that take a lot of energy for as long as told by your health care provider. General instructions  Take over-the-counter and prescription medicines only as told by your health care provider.  Donot drive or use heavy machinery while taking prescription pain medicine.  Do not use any products that contain nicotine or tobacco, such as cigarettes and e-cigarettes. If you need help quitting, ask your health care provider.  Keep all follow-up visits as told by  your health care provider. This is important. How is this prevented?  Use correct form when playing sports and lifting heavy objects.  Use good posture when sitting and standing.  Maintain a healthy weight.  Sleep on a mattress with medium firmness to support your back.  Be safe and responsible while being active to avoid falls.  Do at least 150 minutes of moderate-intensity exercise each week, such as brisk walking or water aerobics. Try a form of exercise that takes stress off your back, such as swimming or stationary cycling.  Maintain physical fitness, including: ? Strength. ? Flexibility. ? Cardiovascular fitness. ? Endurance. Contact a health care provider if:  Your back pain does not improve after 6 weeks of treatment.  Your symptoms get worse. Get help right away if:  Your back pain is severe.  You cannot stand or walk.  You have difficulty controlling when you urinate or when you have a bowel movement.  You feel nauseous or you vomit.  Your feet get very cold.  You have numbness, tingling, weakness, or problems using your arms or legs.  You develop any of the following: ? Shortness of breath. ? Dizziness. ? Pain in your legs. ? Weakness in your buttocks or legs. ? Discoloration of the skin on your toes or legs. This information is not intended to replace advice given to you by your health care provider. Make sure you discuss any questions you have with your health care provider. Document Released: 05/08/2005 Document Revised: 02/16/2016 Document Reviewed: 12/31/2015 Elsevier Interactive Patient Education  [employer].
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
## 2018-07-24 ENCOUNTER — Other Ambulatory Visit: Payer: Self-pay | Admitting: Internal Medicine

## 2018-07-28 DIAGNOSIS — M1711 Unilateral primary osteoarthritis, right knee: Secondary | ICD-10-CM | POA: Diagnosis not present

## 2018-07-28 DIAGNOSIS — Z96641 Presence of right artificial hip joint: Secondary | ICD-10-CM | POA: Diagnosis not present

## 2018-07-30 ENCOUNTER — Telehealth: Payer: Self-pay | Admitting: Internal Medicine
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
## 2018-07-31 NOTE — Telephone Encounter (Signed)
Please advise Dr Panosh, thanks.   
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
## 2018-08-08 ENCOUNTER — Encounter (HOSPITAL_COMMUNITY): Payer: Self-pay

## 2018-08-08 ENCOUNTER — Ambulatory Visit (HOSPITAL_COMMUNITY)
Admission: EM | Admit: 2018-08-08 | Discharge: 2018-08-08 | Disposition: A | Discharge status: Home / Self Care | Payer: Medicare Other | Attending: Physician Assistant | Admitting: Physician Assistant

## 2018-08-08 ENCOUNTER — Other Ambulatory Visit: Payer: Self-pay

## 2018-08-08 DIAGNOSIS — A084 Viral intestinal infection, unspecified: Secondary | ICD-10-CM | POA: Insufficient documentation

## 2018-08-08 DIAGNOSIS — R112 Nausea with vomiting, unspecified: Secondary | ICD-10-CM | POA: Insufficient documentation

## 2018-08-08 MED ORDER — ONDANSETRON HCL 4 MG PO TABS: 4.0000 mg | ORAL_TABLET | Freq: Three times a day (TID) | ORAL | 0 refills | Status: DC | PRN

## 2018-08-08 NOTE — ED Triage Notes (Signed)
Pt cc cough and vomiting and chest discomfort 2 days. Pt states she has a cold as well x 3 weeks.

## 2018-08-08 NOTE — ED Provider Notes (Signed)
08/08/2018 1:37 PM   DOB: 07-08-1941 / MRN: [id number]  SUBJECTIVE:  [name] [name] is a 77 y.o. female presenting for emesis that started last night after drinking some ginger ale.  Reports that almost immediately after drinking this she went and heaved 3 times.  Reports today she felt queasy and decided come here.  Is having some chest pain that is worse with movement as well.  Denies dizziness, leg swelling, shortness of breath, diaphoresis.  No history of diabetes.  History of well-controlled hypertension.  Former smoker.  She has a mild dyslipidemia that is not treated.  She reports that both of her adult children have had a stomach virus lately and have since recovered.  No current facility-administered medications for this encounter.   Current Outpatient Medications:  .  acyclovir (ZOVIRAX) 400 MG tablet, Take PO BID PRN (Patient taking differently: Take 400 mg by mouth daily. ), Disp: 60 tablet, Rfl: 12 .  dicyclomine (BENTYL) 20 MG tablet, Take 1 tablet (20 mg total) by mouth 4 (four) times daily -  before meals and at bedtime., Disp: 120 tablet, Rfl: 1 .  fluticasone (FLONASE) 50 MCG/ACT nasal spray, INHALE 2 SPRAYS IN EACH NOSTRIL EVERY DAY FOR RHINITIS (Patient taking differently: Place 2 sprays into both nostrils daily as needed. ), Disp: 16 g, Rfl: 5 .  naproxen sodium (ALEVE) 220 MG tablet, Take 440 mg by mouth 2 (two) times daily as needed (for pain.)., Disp: , Rfl:  .  ondansetron (ZOFRAN) 4 MG tablet, Take 1 tablet (4 mg total) by mouth every 8 (eight) hours as needed for nausea or vomiting., Disp: 20 tablet, Rfl: 0 .  SYNTHROID 100 MCG tablet, TAKE 1 TABLET BY MOUTH EVERY DAY, Disp: 90 tablet, Rfl: 1 .  triamterene-hydrochlorothiazide (MAXZIDE-25) 37.5-25 MG tablet, TAKE 1 TABLET BY MOUTH EVERY DAY, Disp: 90 tablet, Rfl: 1  She is allergic to lisinopril; lyrica [pregabalin]; pravastatin; simvastatin; codeine phosphate; oxycodone hcl; and strawberry extract.   She  has a past  medical history of Abdominal pain, Adenomatous colon polyp, Allergy, Anemia, Anxiety, Cataract (2013), Colon polyp, Cough, DJD (degenerative joint disease) of lumbar spine, Estrogen deficiency, Gastritis, GERD (gastroesophageal reflux disease), JIRCVELF(810.1), Helicobacter pylori infection (10/13/2007), History of positive PPD (10/16/2011), HSV-2 infection, Hyperglycemia, Hyperlipidemia, Hypertension, IBS (irritable bowel syndrome), Leg cramps (11/05/2013), Medication side effect - Propofol (burning vein) and prvastatin (myalgia) (04/08/2013), Neuromuscular disorder ([place]), Pulsatile tinnitus, Shingles (02/09/2011), Sleep apnea, Thyroid disease, and Vulvitis.    She  reports that she quit smoking about 34 years ago. Her smoking use included cigarettes. She has a 11.50 pack-year smoking history. She has never used smokeless tobacco. She reports that she does not drink alcohol or use drugs. She  reports previously being sexually active and has had partner(s) who are Female. She reports using the following method of birth control/protection: Surgical. The patient  has a past surgical history that includes Abdominal hysterectomy (1982); Lumbar spine surgery (05/1989); Shoulder arthroscopy (Bilateral); Thyroidectomy; isthmuectomy; Tonsillectomy; Tubal ligation (1974); Finger surgery; Colonoscopy (2003- 2102); Total hip arthroplasty (Right, 10/02/2017); and Polypectomy.  Her family history includes Alzheimer's disease in her mother; Asthma in her sister; Colon cancer in her paternal aunt, sister, and sister; Colon polyps in her sister; Heart attack in her brother and father; Thyroid disease in her sister.  Review of Systems  Constitutional: Negative for chills, diaphoresis and fever.  Eyes: Negative.   Respiratory: Negative for cough, hemoptysis, sputum production, shortness of breath and wheezing.   Cardiovascular: Negative for  chest pain.  Gastrointestinal: Positive for nausea and vomiting. Negative for abdominal pain,  blood in stool, constipation, diarrhea, heartburn and melena.  Genitourinary: Negative for dysuria, flank pain, frequency, hematuria and urgency.  Skin: Negative for rash.  Neurological: Negative for dizziness, sensory change, speech change, focal weakness and headaches.    OBJECTIVE:  BP 120/80 (BP Location: Left Arm)   Temp 100 F (37.8 C) (Oral)   Resp 18   Wt 197 lb (89.4 kg)   SpO2 100%   BMI 32.78 kg/m   Wt Readings from Last 3 Encounters:  08/08/18 197 lb (89.4 kg)  07/15/18 208 lb 8 oz (94.6 kg)  07/05/18 192 lb (87.1 kg)   Temp Readings from Last 3 Encounters:  08/08/18 100 F (37.8 C) (Oral)  07/15/18 98.4 F (36.9 C) (Oral)  07/05/18 98 F (36.7 C) (Oral)   BP Readings from Last 3 Encounters:  08/08/18 120/80  07/15/18 (!) 142/80  07/05/18 (!) 145/80   Pulse Readings from Last 3 Encounters:  07/15/18 72  07/05/18 66  06/10/18 76    Physical Exam Vitals signs reviewed.  Constitutional:      General: She is not in acute distress.    Appearance: She is not diaphoretic.  Eyes:     Pupils: Pupils are equal, round, and reactive to light.  Cardiovascular:     Rate and Rhythm: Normal rate and regular rhythm.     Pulses: Normal pulses.     Heart sounds: Normal heart sounds.  Pulmonary:     Effort: Pulmonary effort is normal.  Abdominal:     General: Abdomen is flat. Bowel sounds are normal. There is no distension.     Palpations: There is no mass.     Tenderness: There is no abdominal tenderness. There is no right CVA tenderness, left CVA tenderness, guarding or rebound.     Hernia: No hernia is present.  Skin:    General: Skin is dry.  Neurological:     Mental Status: She is alert and oriented to person, place, and time.     Cranial Nerves: No cranial nerve deficit.     Gait: Gait normal.     No results found for this or any previous visit (from the past 72 hour(s)).  No results found.  ASSESSMENT AND PLAN:   Viral gastroenteritis: Exam  reassuring.  Give her some Zofran.  Nausea and vomiting, intractability of vomiting not specified, unspecified vomiting type    Discharge Instructions     Please start Zofran today.  Try to drink lots of fluids as best you can.  It is okay not to eat if you are not hungry.        The patient is advised to call or return to clinic if she does not see an improvement in symptoms, or to seek the care of the closest emergency department if she worsens with the above plan.   Philis Fendt, MHS, PA-C 08/08/2018 1:37 PM   Tereasa Coop, PA-C 08/08/18 1337

## 2018-08-08 NOTE — Discharge Instructions (Signed)
Please start Zofran today.  Try to drink lots of fluids as best you can.  It is okay not to eat if you are not hungry.

## 2018-08-13 NOTE — Telephone Encounter (Signed)
Unable to reach via telephone mychart message sent.  Nothing further needed.
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
## 2018-08-20 ENCOUNTER — Encounter: Payer: Self-pay | Admitting: Internal Medicine

## 2018-08-20 ENCOUNTER — Ambulatory Visit (INDEPENDENT_AMBULATORY_CARE_PROVIDER_SITE_OTHER): Payer: Medicare Other | Admitting: Internal Medicine

## 2018-08-20 VITALS — BP 132/84 | HR 74 | Temp 98.7°F | Wt 202.9 lb

## 2018-08-20 DIAGNOSIS — M2569 Stiffness of other specified joint, not elsewhere classified: Secondary | ICD-10-CM

## 2018-08-20 DIAGNOSIS — M545 Low back pain, unspecified: Secondary | ICD-10-CM

## 2018-08-20 DIAGNOSIS — M2560 Stiffness of unspecified joint, not elsewhere classified: Secondary | ICD-10-CM

## 2018-08-20 DIAGNOSIS — Z9889 Other specified postprocedural states: Secondary | ICD-10-CM | POA: Diagnosis not present

## 2018-08-20 DIAGNOSIS — R0981 Nasal congestion: Secondary | ICD-10-CM

## 2018-08-20 MED ORDER — FLUTICASONE PROPIONATE 50 MCG/ACT NA SUSP: NASAL | 5 refills | Status: DC

## 2018-08-20 NOTE — Patient Instructions (Signed)
Since your back is still bothering you and will get dr Vertell Limber to give opinion.  Will do referral   To assessment.about  Back    You will be notified about appt   It is reassuring that no new findings  on exam.   Let us know if worse before you get opinion.

## 2018-09-22 DIAGNOSIS — H35363 Drusen (degenerative) of macula, bilateral: Secondary | ICD-10-CM | POA: Diagnosis not present

## 2018-09-22 DIAGNOSIS — H04123 Dry eye syndrome of bilateral lacrimal glands: Secondary | ICD-10-CM | POA: Diagnosis not present

## 2018-09-22 DIAGNOSIS — H33193 Other retinoschisis and retinal cysts, bilateral: Secondary | ICD-10-CM | POA: Diagnosis not present

## 2018-09-22 DIAGNOSIS — Z961 Presence of intraocular lens: Secondary | ICD-10-CM | POA: Diagnosis not present

## 2018-09-22 LAB — HM DIABETES EYE EXAM

## 2018-09-23 ENCOUNTER — Encounter: Payer: Self-pay | Admitting: Internal Medicine

## 2018-10-07 DIAGNOSIS — M5416 Radiculopathy, lumbar region: Secondary | ICD-10-CM | POA: Diagnosis not present

## 2018-10-07 DIAGNOSIS — M545 Low back pain: Secondary | ICD-10-CM | POA: Diagnosis not present

## 2018-10-07 DIAGNOSIS — M48062 Spinal stenosis, lumbar region with neurogenic claudication: Secondary | ICD-10-CM | POA: Diagnosis not present

## 2018-10-12 DIAGNOSIS — Z471 Aftercare following joint replacement surgery: Secondary | ICD-10-CM | POA: Diagnosis not present

## 2018-10-12 DIAGNOSIS — Z96641 Presence of right artificial hip joint: Secondary | ICD-10-CM | POA: Diagnosis not present

## 2018-10-29 ENCOUNTER — Other Ambulatory Visit: Payer: Self-pay

## 2018-10-29 ENCOUNTER — Encounter: Payer: Self-pay | Admitting: Internal Medicine

## 2018-10-29 ENCOUNTER — Ambulatory Visit (INDEPENDENT_AMBULATORY_CARE_PROVIDER_SITE_OTHER): Payer: Medicare Other | Admitting: Internal Medicine

## 2018-10-29 ENCOUNTER — Telehealth: Payer: Self-pay | Admitting: Internal Medicine

## 2018-10-29 VITALS — BP 128/74 | HR 76 | Temp 98.0°F

## 2018-10-29 DIAGNOSIS — J069 Acute upper respiratory infection, unspecified: Secondary | ICD-10-CM

## 2018-10-29 NOTE — Telephone Encounter (Signed)
Questions for Screening COVID-19  Symptom onset: "feels sinus related with drainage" Cough with mucous production - Tylenol Cold and Sinus Sore throat  No fever, SOB, chills, body aches  Travel or Contacts:  No travel No contact  During this illness, did/does the patient experience any of the following symptoms? Fever >100.104F []   Yes [x]   No []   Unknown Subjective fever (felt feverish) []   Yes [x]   No []   Unknown Chills []   Yes [x]   No []   Unknown Muscle aches (myalgia) []   Yes [x]   No []   Unknown Runny nose (rhinorrhea) []   Yes [x]   No []   Unknown Sore throat [x]   Yes []   No []   Unknown Cough (new onset or worsening of chronic cough) [x]   Yes []   No []   Unknown Shortness of breath (dyspnea) []   Yes [x]   No []   Unknown Nausea or vomiting []   Yes [x]   No []   Unknown Headache [x]   Yes []   No []   Unknown Abdominal pain  []   Yes [x]   No []   Unknown Diarrhea (?3 loose/looser than normal stools/24hr period) []   Yes [x]   No []   Unknown Other, specify:  Patient risk factors: Smoker? []   Current []   Former []   Never If female, currently pregnant? []   Yes []   No  Patient Active Problem List   Diagnosis Date Noted  . Dermatofibroma 12/23/2017  . Osteoarthritis of right hip 10/02/2017  . Multinodular goiter 06/15/2015  . Post-surgical hypothyroidism 06/15/2015  . Leg pain, lateral 08/30/2014  . Hyperglycemia 11/05/2013  . Family history of Alzheimer's disease 11/05/2013  . Memory problem 11/05/2013  . Medication side effect - Propofol (burning vein) and prvastatin (myalgia) 04/08/2013  . Lesion of spleen 04/08/2013  . Arthritis of shoulder 11/03/2012  . History of back surgery 11/03/2012  . HSV-2 seropositive 02/17/2012  . Medicare annual wellness visit, subsequent 10/16/2011  . OSA (obstructive sleep apnea) 10/16/2011  . History of positive PPD 10/16/2011  . History of colonic polyps 07/18/2011  . Dyspnea 10/18/2010  . Heart burn 10/18/2010  . Chest pain 10/18/2010  .  ADJUSTMENT DISORDER WITH ANXIETY 07/24/2010  . CHEST WALL PAIN, ANTERIOR 07/24/2010  . KNEE PAIN 04/04/2010  . SLEEP DISORDER/DISTURBANCE 11/21/2009  . LEG PAIN 02/28/2009  . TINNITUS 05/11/2008  . HEADACHE 02/07/2008  . UNSPECIFIED VITAMIN D DEFICIENCY 02/05/2008  . OBESITY, MILD 10/13/2007  . ANEMIA 10/13/2007  . GASTRITIS, CHRONIC 10/13/2007  . DUODENITIS, WITHOUT HEMORRHAGE 10/13/2007  . HIATAL HERNIA 10/13/2007  . IRRITABLE BOWEL SYNDROME 10/13/2007  . GRANULOMA 10/13/2007  . RHINITIS 09/23/2007  . FASTING HYPERGLYCEMIA 09/04/2007  . HYPERLIPIDEMIA 04/07/2007  . HYPERTENSION 04/07/2007  . GERD 04/07/2007    Plan:  []   High risk for COVID-19 with red flags go to ED (with CP, SOB, weak/lightheaded, or fever > 101.5). Call ahead.  [x]   High risk for COVID-19 but stable will have car visit. Inform provider and coordinate time. Will be completed in afternoon. []   No red flags but URI signs or symptoms will go through side door and be seen in dedicated room.  Note: Referral to telemedicine is an appropriate alternative disposition for higher risk but stable. [facility]/e-Visit: [phone].

## 2018-10-29 NOTE — Progress Notes (Signed)
Subjective:    Patient ID: [name] [name], female    DOB: Dec 20, 1940, 78 y.o.   MRN: [id number]  HPI 78 year old female who  has a past medical history of Abdominal pain, Adenomatous colon polyp, Allergy, Anemia, Anxiety, Cataract (2013), Colon polyp, Cough, DJD (degenerative joint disease) of lumbar spine, Estrogen deficiency, Gastritis, GERD (gastroesophageal reflux disease), XFGHWEXH(371.6), Helicobacter pylori infection (10/13/2007), History of positive PPD (10/16/2011), HSV-2 infection, Hyperglycemia, Hyperlipidemia, Hypertension, IBS (irritable bowel syndrome), Leg cramps (11/05/2013), Medication side effect - Propofol (burning vein) and prvastatin (myalgia) (04/08/2013), Neuromuscular disorder ([place]), Pulsatile tinnitus, Shingles (02/09/2011), Sleep apnea, Thyroid disease, and Vulvitis.  Present to the clinic today for an acute issue. She reports right sided nasal congestion, sore throat, and non productive cough. Symptoms have been present for 2 weeks. Symptoms are improving and cough has improved. . She denies fevers, chills, shortness of breath, wheezing, or feeling ill.   Review of Systems See HPI   Past Medical History:  Diagnosis Date  . Abdominal pain     Neg ct [place] and mri of back   . Adenomatous colon polyp   . Allergy   . Anemia   . Anxiety   . Cataract 2013   bilat removed   . Colon polyp   . Cough    ent evaluation  . DJD (degenerative joint disease) of lumbar spine   . Estrogen deficiency   . Gastritis   . GERD (gastroesophageal reflux disease)   . Headache(784.0)   . Helicobacter pylori infection 10/13/2007   Qualifier: Diagnosis of  By: Marland Mcalpine    . History of positive PPD 10/16/2011  . HSV-2 infection   . Hyperglycemia   . Hyperlipidemia    no meds now  . Hypertension   . IBS (irritable bowel syndrome)   . Leg cramps 11/05/2013   Appears to have been from the higher dose of diuretic better on low dose and magnesium.   . Medication side effect -  Propofol (burning vein) and prvastatin (myalgia) 04/08/2013   Pravastatin patient went off and rechallenged and symptoms recurred muscle cramps /foot cramps   . Neuromuscular disorder (HCC)    muscle cramps  . Pulsatile tinnitus    had MRA and MPV nl mild carotid dopplers2010  . Shingles 02/09/2011   Right upper face and eyelid but not involving the eye at this point.   . Sleep apnea    does not use c pap  . Thyroid disease   . Vulvitis     Social History   Socioeconomic History  . Marital status: Single    Spouse name: widowed.   . Number of children: Y  . Years of education: Not on file  . Highest education level: Not on file  Occupational History  . Occupation: retired.    Comment: [employer]----now retired.    Employer: RETIRED  Social Needs  . Financial resource strain: Not on file  . Food insecurity:    Worry: Not on file    Inability: Not on file  . Transportation needs:    Medical: Not on file    Non-medical: Not on file  Tobacco Use  . Smoking status: Former Smoker    Packs/day: 0.50    Years: 23.00    Pack years: 11.50    Types: Cigarettes    Last attempt to quit: 06/16/1984    Years since quitting: 34.3  . Smokeless tobacco: Never Used  Substance and Sexual Activity  . Alcohol use:  No  . Drug use: No  . Sexual activity: Not Currently    Partners: Male    Birth control/protection: Surgical  Lifestyle  . Physical activity:    Days per week: Not on file    Minutes per session: Not on file  . Stress: Not on file  Relationships  . Social connections:    Talks on phone: Not on file    Gets together: Not on file    Attends religious service: Not on file    Active member of club or organization: Not on file    Attends meetings of clubs or organizations: Not on file    Relationship status: Not on file  . Intimate partner violence:    Fear of current or ex partner: Not on file    Emotionally abused: Not on file    Physically abused: Not on file    Forced  sexual activity: Not on file  Other Topics Concern  . Not on file  Social History Narrative   Retired    former smoker    widowed fall 2010 from myeloma. Lives alone.    Hhof 1  No pets   Taking care of  59 yo gc and now newborn in the fall     takign time off of child care     Past Surgical History:  Procedure Laterality Date  . ABDOMINAL HYSTERECTOMY  1982   fibroids  . COLONOSCOPY  2003- 2102   adenomatous polyps  . FINGER SURGERY     left middle A1pulley release  . isthmuectomy     thyroid surgery  . LUMBAR SPINE SURGERY  05/1989   spine d/t tumor neurofibroma Quentin Cornwall  . POLYPECTOMY    . SHOULDER ARTHROSCOPY Bilateral    left, right 2017  . THYROIDECTOMY     partial rt  ballen  . TONSILLECTOMY     at age 68.   Marland Kitchen TOTAL HIP ARTHROPLASTY Right 10/02/2017   Procedure: RIGHT TOTAL HIP ARTHROPLASTY ANTERIOR APPROACH;  Surgeon: Rod Can, MD;  Location: [facility];  Service: Orthopedics;  Laterality: Right;  Needs RNFA  . TUBAL LIGATION  1974    Family History  Problem Relation Age of Onset  . Alzheimer's disease Mother   . Heart attack Father   . Asthma Sister   . Colon cancer Sister   . Heart attack Brother   . Colon polyps Sister   . Colon cancer Sister   . Colon cancer Paternal Aunt   . Thyroid disease Sister        multinodular goiter  . Stomach cancer Neg Hx   . Breast cancer Neg Hx   . Rectal cancer Neg Hx     Allergies  Allergen Reactions  . Lisinopril     REACTION: COUGH  . Lyrica [Pregabalin] Other (See Comments)    Cns side efffects  . Pravastatin Other (See Comments)    Cramps.  . Simvastatin     Myalgia and stiffness  . Codeine Phosphate     REACTION: itching  . Oxycodone Hcl     REACTION: rash  . Strawberry Extract     Current Outpatient Medications on File Prior to Visit  Medication Sig Dispense Refill  . acyclovir (ZOVIRAX) 400 MG tablet Take PO BID PRN (Patient taking differently: Take 400 mg by mouth daily. ) 60 tablet 12  .  dicyclomine (BENTYL) 20 MG tablet Take 1 tablet (20 mg total) by mouth 4 (four) times daily -  before meals and  at bedtime. 120 tablet 1  . fluticasone (FLONASE) 50 MCG/ACT nasal spray INHALE 2 SPRAYS IN EACH NOSTRIL EVERY DAY FOR RHINITIS 16 g 5  . naproxen sodium (ALEVE) 220 MG tablet Take 440 mg by mouth 2 (two) times daily as needed (for pain.).    Marland Kitchen ondansetron (ZOFRAN) 4 MG tablet Take 1 tablet (4 mg total) by mouth every 8 (eight) hours as needed for nausea or vomiting. 20 tablet 0  . SYNTHROID 100 MCG tablet TAKE 1 TABLET BY MOUTH EVERY DAY 90 tablet 1  . triamterene-hydrochlorothiazide (MAXZIDE-25) 37.5-25 MG tablet TAKE 1 TABLET BY MOUTH EVERY DAY 90 tablet 1   No current facility-administered medications on file prior to visit.     BP 128/74   Pulse 76   Temp 98 F (36.7 C)   SpO2 98%       Objective:   Physical Exam Vitals signs and nursing note reviewed.  Constitutional:      Appearance: Normal appearance.  HENT:     Nose: Nose normal. No congestion or rhinorrhea.     Mouth/Throat:     Mouth: Mucous membranes are moist.     Pharynx: Oropharynx is clear. No oropharyngeal exudate.  Eyes:     Extraocular Movements: Extraocular movements intact.     Pupils: Pupils are equal, round, and reactive to light.  Cardiovascular:     Rate and Rhythm: Normal rate and regular rhythm.     Pulses: Normal pulses.     Heart sounds: Normal heart sounds.  Pulmonary:     Effort: Pulmonary effort is normal.     Breath sounds: Normal breath sounds.  Abdominal:     General: Abdomen is flat.     Palpations: Abdomen is soft.  Skin:    General: Skin is warm and dry.  Neurological:     General: No focal deficit present.     Mental Status: She is alert and oriented to person, place, and time.  Psychiatric:        Mood and Affect: Mood normal.        Behavior: Behavior normal.        Thought Content: Thought content normal.        Judgment: Judgment normal.       Assessment &  Plan:  1. Upper respiratory tract infection, unspecified type - symptoms improving.  - Advised Flonase  - Stay hydrated and rest - follow up as needed  Dorothyann Peng, NP

## 2018-10-31 ENCOUNTER — Other Ambulatory Visit: Payer: Self-pay | Admitting: Internal Medicine

## 2018-11-04 ENCOUNTER — Ambulatory Visit: Payer: Self-pay | Admitting: *Deleted

## 2018-11-04 NOTE — Telephone Encounter (Signed)
  Reason for Disposition . [1] Sore throat with cough/cold symptoms AND [2] present < 5 days  Answer Assessment - Initial Assessment Questions 1. ONSET: "When did the throat start hurting?" (Hours or days ago)      Several days ago with sinus drainage. 2. SEVERITY: "How bad is the sore throat?" (Scale 1-10; mild, moderate or severe)   - MILD (1-3):  doesn't interfere with eating or normal activities   - MODERATE (4-7): interferes with eating some solids and normal activities   - SEVERE (8-10):  excruciating pain, interferes with most normal activities   - SEVERE DYSPHAGIA: can't swallow liquids, drooling     Mild today 3. STREP EXPOSURE: "Has there been any exposure to strep within the past week?" If so, ask: "What type of contact occurred?"      no 4.  VIRAL SYMPTOMS: "Are there any symptoms of a cold, such as a runny nose, cough, hoarse voice or red eyes?"      Cough and phlegm but improved 5. FEVER: "Do you have a fever?" If so, ask: "What is your temperature, how was it measured, and when did it start?"     No fever 6. PUS ON THE TONSILS: "Is there pus on the tonsils in the back of your throat?"     7. OTHER SYMPTOMS: "Do you have any other symptoms?" (e.g., difficulty breathing, headache, rash)     none 8. PREGNANCY: "Is there any chance you are pregnant?" "When was your last menstrual period?"     na  Protocols used: SORE THROAT-A-AH

## 2018-11-04 NOTE — Telephone Encounter (Signed)
Sore throat but improving. No fever no difficulty breathing. Continue warm tea with honey for her throat. Analgesics for other aches I needed.Monitor temperature for fever. Stay home get rest use hand sanitizer often. Use gloves and mask if going out. Call back with fever and or SOB. Stated she understood.

## 2018-11-05 ENCOUNTER — Ambulatory Visit: Payer: Self-pay

## 2018-11-05 ENCOUNTER — Other Ambulatory Visit: Payer: Self-pay | Admitting: Internal Medicine

## 2018-11-05 NOTE — Telephone Encounter (Signed)
have her  Stop the cold medicines  And make webx appt for tomorrow .  If possible  check  bp and pulse  Reading for the visit tomorrow

## 2018-11-05 NOTE — Telephone Encounter (Signed)
Pt schedule for e-visit

## 2018-11-05 NOTE — Telephone Encounter (Signed)
Tried to call pt back  lvm  

## 2018-11-05 NOTE — Progress Notes (Signed)
Virtual Visit via Video Note  I connected with@ on 11/06/18 at  10 20   by a video enabled telemedicine application and verified that I am speaking with the correct person using two identifiers.  Location patient: home Location provider:work or home office Persons participating in the virtual visit: patient, provider  limitations of evaluation and management by telemedicine and the limited availability of in person appointments. The patient expressed understanding and agreed to proceed.   HPI: [name] [name] having WebEx visit today because of symptoms she has had over the last few days. New    Currently today she has not had any episodes.  See nurse triage. Basically last week on March 18 she was seen at the drive-through for sore throat nasal drainage and a cough.  She was felt to be okay and the doctor told her she looked great and was told to go back on her nasal spray.  Since that time she is significantly improved and has only minimal cough and sinus drainage is better.  No vomiting diarrhea. She has had a couple episodes which last less than 10 minutes of feeling hot and weak she has had 2-hour postprandial shaky nerve feeling when she has had cornflakes or a simple carbohydrate breakfast cold cereal. She also has sleep apnea and gets a dry throat with this but this is baseline. She had been taking Tylenol Cold and sinus severe strength but has not taken it today.  ROS: See pertinent positives and negatives per HPI.  No shortness of breath minimal phlegm and chest much better than last week denies tachycardia syncope.  Past Medical History:  Diagnosis Date  . Abdominal pain     Neg ct [place] and mri of back   . Adenomatous colon polyp   . Allergy   . Anemia   . Anxiety   . Cataract 2013   bilat removed   . Colon polyp   . Cough    ent evaluation  . DJD (degenerative joint disease) of lumbar spine   . Estrogen deficiency   . Gastritis   . GERD (gastroesophageal reflux  disease)   . Headache(784.0)   . Helicobacter pylori infection 10/13/2007   Qualifier: Diagnosis of  By: Marland Mcalpine    . History of positive PPD 10/16/2011  . HSV-2 infection   . Hyperglycemia   . Hyperlipidemia    no meds now  . Hypertension   . IBS (irritable bowel syndrome)   . Leg cramps 11/05/2013   Appears to have been from the higher dose of diuretic better on low dose and magnesium.   . Medication side effect - Propofol (burning vein) and prvastatin (myalgia) 04/08/2013   Pravastatin patient went off and rechallenged and symptoms recurred muscle cramps /foot cramps   . Neuromuscular disorder (HCC)    muscle cramps  . Pulsatile tinnitus    had MRA and MPV nl mild carotid dopplers2010  . Shingles 02/09/2011   Right upper face and eyelid but not involving the eye at this point.   . Sleep apnea    does not use c pap  . Thyroid disease   . Vulvitis     Past Surgical History:  Procedure Laterality Date  . ABDOMINAL HYSTERECTOMY  1982   fibroids  . COLONOSCOPY  2003- 2102   adenomatous polyps  . FINGER SURGERY     left middle A1pulley release  . isthmuectomy     thyroid surgery  . LUMBAR SPINE SURGERY  05/1989   spine d/t tumor neurofibroma Quentin Cornwall  . POLYPECTOMY    . SHOULDER ARTHROSCOPY Bilateral    left, right 2017  . THYROIDECTOMY     partial rt  ballen  . TONSILLECTOMY     at age 62.   Marland Kitchen TOTAL HIP ARTHROPLASTY Right 10/02/2017   Procedure: RIGHT TOTAL HIP ARTHROPLASTY ANTERIOR APPROACH;  Surgeon: Rod Can, MD;  Location: [facility];  Service: Orthopedics;  Laterality: Right;  Needs RNFA  . TUBAL LIGATION  1974    Family History  Problem Relation Age of Onset  . Alzheimer's disease Mother   . Heart attack Father   . Asthma Sister   . Colon cancer Sister   . Heart attack Brother   . Colon polyps Sister   . Colon cancer Sister   . Colon cancer Paternal Aunt   . Thyroid disease Sister        multinodular goiter  . Stomach cancer Neg Hx   . Breast  cancer Neg Hx   . Rectal cancer Neg Hx        Current Outpatient Medications:  .  acyclovir (ZOVIRAX) 400 MG tablet, Take PO BID PRN (Patient taking differently: Take 400 mg by mouth daily. ), Disp: 60 tablet, Rfl: 12 .  dicyclomine (BENTYL) 20 MG tablet, Take 1 tablet (20 mg total) by mouth 4 (four) times daily -  before meals and at bedtime., Disp: 120 tablet, Rfl: 1 .  fluticasone (FLONASE) 50 MCG/ACT nasal spray, INHALE 2 SPRAYS IN EACH NOSTRIL EVERY DAY FOR RHINITIS, Disp: 16 g, Rfl: 5 .  naproxen sodium (ALEVE) 220 MG tablet, Take 440 mg by mouth 2 (two) times daily as needed (for pain.)., Disp: , Rfl:  .  ondansetron (ZOFRAN) 4 MG tablet, Take 1 tablet (4 mg total) by mouth every 8 (eight) hours as needed for nausea or vomiting., Disp: 20 tablet, Rfl: 0 .  SYNTHROID 100 MCG tablet, TAKE 1 TABLET BY MOUTH EVERY DAY, Disp: 90 tablet, Rfl: 1 .  triamterene-hydrochlorothiazide (MAXZIDE-25) 37.5-25 MG tablet, TAKE 1 TABLET BY MOUTH EVERY DAY, Disp: 90 tablet, Rfl: 1  EXAM:  VITALS per patient if applicable:  GENERAL: alert, oriented, appears well and in no acute distress she looks well today and has normal speech no cough during visit.  Does not appear severely congested.  HEENT: atraumatic, conjunttiva clear, no obvious abnormalities on inspection of external nose and ears  NECK: normal movements of the head and neck  LUNGS: on inspection no signs of respiratory distress, breathing rate appears normal, no obvious gross SOB, gasping or wheezing  CV: no obvious cyanosis  MS: moves all visible extremities without noticeable abnormality  PSYCH/NEURO: pleasant and cooperative, no obvious depression or anxiety, speech and thought processing grossly intact  ASSESSMENT AND PLAN:  Discussed the following assessment and plan:  Sensation of feeling hot - transient poss from cold med and  resolving resp inf  Respiratory symptoms Convalescing respiratory infection versus sinus  problem. New episodes transient symptoms that certainly could be related to cold medicine and illness. See no signs of cardiovascular pulmonary or alarm system dysfunction. We will have her stays hydrated stay off medicine unless she wants to take Tylenol and her regular medicines avoid simple carb meals and contact us next week if concerns.  She could get another symptom I have her check her pulse and blood pressure at that time. I do not think she  Has complicated findings     I discussed the assessment  and treatment plan with the patient. The patient was provided an opportunity to ask questions and all were answered. The patient agreed with the plan and demonstrated an understanding of the instructions.   The patient was advised to call back or seek an in-person evaluation if the symptoms worsen or if the condition fails to improve as anticipated.  I provided  21 minutes of non-face-to-face face tp face on webx  time during this encounter.   Shanon Ace, MD

## 2018-11-05 NOTE — Telephone Encounter (Signed)
C/o intermittent feeling of having "hot flash" that starts in her head and moves down into the upper body.  Reported feels light-headed.  Stated the episode has occurred 3 times over past week, and only lasts 5-8 min.  Denied feeling faint, but stated felt weak.  Denied sweating, blurred vision, chest pain, or shortness of breath.  Voiced concern if the hot flash could be a  symptom of Coronavirus?  Stated she has occasional cough that has improved from  last week.  Reported has been taking "Tylenol Cold and Flu Severe" tablets, and questioned if this could be contributing to the hot flash episodes?  Does not have thermometer to check temperature.  stated "I feel fine now".  Advised will send message for PCP to review, to determine need for office appt. vs Web Ex visit.  Care advice given per protocol.  Encouraged to stay well hydrated, avoid going out in community if can avoid.  Encouraged vigilant hand washing.  Pt. Verb. Understanding, and agrees with plan.             Reason for Disposition . [1] MILD dizziness (e.g., walking normally) AND [2] has NOT been evaluated by physician for this  (Exception: dizziness caused by heat exposure, sudden standing, or poor fluid intake)  Answer Assessment - Initial Assessment Questions 1. DESCRIPTION: "Describe your dizziness."     Feels hot flash from head that moves down through upper body 2. LIGHTHEADED: "Do you feel lightheaded?" (e.g., somewhat faint, woozy, weak upon standing)     Yes, stated gets light-headed and a little weak 3. VERTIGO: "Do you feel like either you or the room is spinning or tilting?" (i.e. vertigo)     Denied  4. SEVERITY: "How bad is it?"  "Do you feel like you are going to faint?" "Can you stand and walk?"   - MILD - walking normally   - MODERATE - interferes with normal activities (e.g., work, school)    - SEVERE - unable to stand, requires support to walk, feels like passing out now.      Mild  5. ONSET:  "When did the  dizziness begin?"     Intermittent episodes x 3 in past week; lasts about 5-8 minutes  6. AGGRAVATING FACTORS: "Does anything make it worse?" (e.g., standing, change in head position)     No aggravating factors noted  7. HEART RATE: "Can you tell me your heart rate?" "How many beats in 15 seconds?"  (Note: not all patients can do this)       Unable to check pulse 8. CAUSE: "What do you think is causing the dizziness?"     Questioned if this was a symptom of the Coronavirus  9. RECURRENT SYMPTOM: "Have you had dizziness before?" If so, ask: "When was the last time?" "What happened that time?"     Has had vertigo in past, but denied that this is the same symptoms 10. OTHER SYMPTOMS: "Do you have any other symptoms?" (e.g., fever, chest pain, vomiting, diarrhea, bleeding)       Denied chest discomfort, shortness of breath, occas. Cough, but has improved from a week ago 11. PREGNANCY: "Is there any chance you are pregnant?" "When was your last menstrual period?"       N/a  Protocols used: DIZZINESS [facility]

## 2018-11-06 ENCOUNTER — Encounter: Payer: Self-pay | Admitting: Internal Medicine

## 2018-11-06 ENCOUNTER — Ambulatory Visit (INDEPENDENT_AMBULATORY_CARE_PROVIDER_SITE_OTHER): Payer: Medicare Other | Admitting: Internal Medicine

## 2018-11-06 ENCOUNTER — Other Ambulatory Visit: Payer: Self-pay

## 2018-11-06 DIAGNOSIS — R0989 Other specified symptoms and signs involving the circulatory and respiratory systems: Secondary | ICD-10-CM | POA: Diagnosis not present

## 2018-11-06 DIAGNOSIS — R6889 Other general symptoms and signs: Secondary | ICD-10-CM | POA: Diagnosis not present

## 2018-11-18 ENCOUNTER — Ambulatory Visit: Payer: Self-pay | Admitting: Internal Medicine

## 2018-11-18 NOTE — Telephone Encounter (Signed)
Pt. called to report she was on a step ladder last week, and missed one of the steps, falling into the ladder, hitting the left side of chest, over breast region.  Stated there has been "a soreness across the left breast to the breast bone, all down the middle".  Questioned pt. about swelling or bruising; reported "the breast feels larger on the inside"; denied any visible swelling or bruising.  Denied any difficulty breathing, or pain with deep breath.  Also, reported she had another episode of feeling a hot flash, from waist to head, that lasted about 5-7 minutes, yesterday.  Stated she discussed this with Dr. Regis Bill at her last visit, and was advised to increase her protein intake.  Reported she remembers eating more starchy foods, prior to the episode of the hot flash.  Care advice given, per protocol, for the chest injury.  Advised will send Triage note to Dr. Regis Bill to make further recommendations.  Pt. Verb. Understanding, and agreed with plan.         Reason for Disposition . [1] MODERATE pain (e.g., interferes with normal activities) AND [2] present > 3 days  Answer Assessment - Initial Assessment Questions 1. MECHANISM: "How did the injury happen?"     Missed a step on the step ladder, and fell into the ladder hitting the left side of chest  2. ONSET: "When did the injury happen?" (e.g., minutes, hours, days ago)     Last week  3. LOCATION: "Where on the chest is the injury located?" "Where does it hurt?"     Left side of chest over the breast area  4. CHEST OR RIB PAIN SEVERITY: "How bad is the pain?"  (e.g., Scale 1-10; mild, moderate, or severe)    - MILD (1-3): doesn't interfere with normal activities     - MODERATE (4-7): interferes with normal activities or awakens from sleep    - SEVERE (8-10): excruciating pain, unable to do any normal activities      Moderate 5. BREATHING DIFFICULTY: "Are you having any difficulty breathing?" If so, ask "How bad is it?"  (e.g., none, mild,  moderate, severe)    Denied any breathing difficulty 6: OTHER SYMPTOMS (e.g., cough, fever, rash)     Left breast feels a little larger on the inside 7. PREGNANCY: "Is there any chance you are pregnant?" "When was your last menstrual period?"     N/a  Protocols used: CHEST INJURY - BENDING, LIFTING, OR TWISTING-A-AH Message from Scherrie Gerlach sent at 11/18/2018 7:52 AM EDT   Summary: advice   pt states she was to let Dr Regis Bill know if she had another episode of feeling hot and she did yesterday. She spoke to Dr Regis Bill about this at last visit which was virtual. She states it has nothing to do with the virus. Also states she stumbled off a step ladder the other day and think she may have pulled a muscle in her chest. It hurts under her breast where she hit.. Pt wants to know if she should be seen?

## 2018-11-18 NOTE — Telephone Encounter (Signed)
If she thinks the soreness is from the  Injury  to chest wall  And has no   problems breathing   Then I would  Ice the area 3 x per day  and see how she does  Next week, we can do a fu visit  If not getting better   . consdier in person  If needed  Have her take her  bp and pulse again  If has another  Episode  Of feeling hot  . As when we talked before

## 2018-11-19 NOTE — Telephone Encounter (Signed)
Pt has understanding and will call in next week if appt needed

## 2018-11-23 NOTE — Progress Notes (Signed)
Virtual Visit via Video Note  I connected with@ on 11/24/18 at 10:00 AM EDT by a video enabled telemedicine application and verified that I am speaking with the correct person using two identifiers. Location patient: home Location provider:work e office Persons participating in the virtual visit: patient, provider  [place] national recommendations  regarding COVID 19 pandemic   video visit is advised over in office visit for this patient.  Discussed the limitations of evaluation and management by telemedicine and  availability of in person appointments. The patient expressed understanding and agreed to proceed.   HPI: [name] [name]  [name] [name] has been having recurrent spells that last anywhere from 5-10 almost 15 minutes and becoming more frequent and she calls "harder".  She states she has upper body hot flash feeling and then feeling faint short of breath and rapid heart rate.  She is also been having some heartburn symptoms recently for which she is taken an antacid. Last night she had an episode when she was mopping the floor that made her sit down and scared her. No fever no upper congestion significant may have a minor cough.  Please see past notes where she had a respiratory infection like symptoms.  No history of active cardiac disease had an echo in the fall which showed some hypertensive changes but no acute findings and years ago had a cardiac catheterization by Dr. Pernell Dupre that apparently was okay. No history of arrhythmia and does not feel these are panic attacks as she feels bad before she feels anxious. She feels jittery at times her last thyroid check was in range.  ROS: See pertinent positives and negatives per HPI.  Past Medical History:  Diagnosis Date  . Abdominal pain     Neg ct [place] and mri of back   . Adenomatous colon polyp   . Allergy   . Anemia   . Anxiety   . Cataract 2013   bilat removed   . Colon polyp   . Cough    ent evaluation  . DJD  (degenerative joint disease) of lumbar spine   . Estrogen deficiency   . Gastritis   . GERD (gastroesophageal reflux disease)   . Headache(784.0)   . Helicobacter pylori infection 10/13/2007   Qualifier: Diagnosis of  By: Marland Mcalpine    . History of positive PPD 10/16/2011  . HSV-2 infection   . Hyperglycemia   . Hyperlipidemia    no meds now  . Hypertension   . IBS (irritable bowel syndrome)   . Leg cramps 11/05/2013   Appears to have been from the higher dose of diuretic better on low dose and magnesium.   . Medication side effect - Propofol (burning vein) and prvastatin (myalgia) 04/08/2013   Pravastatin patient went off and rechallenged and symptoms recurred muscle cramps /foot cramps   . Neuromuscular disorder (HCC)    muscle cramps  . Pulsatile tinnitus    had MRA and MPV nl mild carotid dopplers2010  . Shingles 02/09/2011   Right upper face and eyelid but not involving the eye at this point.   . Sleep apnea    does not use c pap  . Thyroid disease   . Vulvitis     Past Surgical History:  Procedure Laterality Date  . ABDOMINAL HYSTERECTOMY  1982   fibroids  . COLONOSCOPY  2003- 2102   adenomatous polyps  . FINGER SURGERY     left middle A1pulley release  . isthmuectomy  thyroid surgery  . LUMBAR SPINE SURGERY  05/1989   spine d/t tumor neurofibroma Quentin Cornwall  . POLYPECTOMY    . SHOULDER ARTHROSCOPY Bilateral    left, right 2017  . THYROIDECTOMY     partial rt  ballen  . TONSILLECTOMY     at age 30.   Marland Kitchen TOTAL HIP ARTHROPLASTY Right 10/02/2017   Procedure: RIGHT TOTAL HIP ARTHROPLASTY ANTERIOR APPROACH;  Surgeon: Rod Can, MD;  Location: [facility];  Service: Orthopedics;  Laterality: Right;  Needs RNFA  . TUBAL LIGATION  1974    Family History  Problem Relation Age of Onset  . Alzheimer's disease Mother   . Heart attack Father   . Asthma Sister   . Colon cancer Sister   . Heart attack Brother   . Colon polyps Sister   . Colon cancer Sister   .  Colon cancer Paternal Aunt   . Thyroid disease Sister        multinodular goiter  . Stomach cancer Neg Hx   . Breast cancer Neg Hx   . Rectal cancer Neg Hx     SOCIAL HX:    Current Outpatient Medications:  .  acyclovir (ZOVIRAX) 400 MG tablet, Take PO BID PRN (Patient taking differently: Take 400 mg by mouth daily. ), Disp: 60 tablet, Rfl: 12 .  dicyclomine (BENTYL) 20 MG tablet, Take 1 tablet (20 mg total) by mouth 4 (four) times daily -  before meals and at bedtime., Disp: 120 tablet, Rfl: 1 .  fluticasone (FLONASE) 50 MCG/ACT nasal spray, INHALE 2 SPRAYS IN EACH NOSTRIL EVERY DAY FOR RHINITIS, Disp: 16 g, Rfl: 5 .  naproxen sodium (ALEVE) 220 MG tablet, Take 440 mg by mouth 2 (two) times daily as needed (for pain.)., Disp: , Rfl:  .  ondansetron (ZOFRAN) 4 MG tablet, Take 1 tablet (4 mg total) by mouth every 8 (eight) hours as needed for nausea or vomiting., Disp: 20 tablet, Rfl: 0 .  SYNTHROID 100 MCG tablet, TAKE 1 TABLET BY MOUTH EVERY DAY, Disp: 90 tablet, Rfl: 1 .  triamterene-hydrochlorothiazide (MAXZIDE-25) 37.5-25 MG tablet, TAKE 1 TABLET BY MOUTH EVERY DAY, Disp: 90 tablet, Rfl: 1  EXAM:  VITALS per patient if applicable:  GENERAL: alert, oriented, appears well and in no acute distress  HEENT: atraumatic, conjunttiva clear, no obvious abnormalities on inspection of external nose and ears  NECK: normal movements of the head and neck  LUNGS: on inspection no signs of respiratory distress, breathing rate appears normal, no obvious gross SOB, gasping or wheezing  CV: no obvious cyanosis  MS: moves all visible extremities without noticeable abnormality  PSYCH/NEURO: pleasant and cooperative, appears mildly anxious but not acutely ill.  Speech and thought processing grossly intact Lab Results  Component Value Date   WBC 5.3 04/14/2018   HGB 12.5 04/14/2018   HCT 38.0 04/14/2018   PLT 187.0 04/14/2018   GLUCOSE 88 04/14/2018   CHOL 208 (H) 04/14/2018   TRIG 80.0  04/14/2018   HDL 67.70 04/14/2018   LDLDIRECT 131.1 08/09/2009   LDLCALC 124 (H) 04/14/2018   ALT 19 04/14/2018   AST 23 04/14/2018   NA 140 04/14/2018   K 4.3 04/14/2018   CL 102 04/14/2018   CREATININE 0.82 04/14/2018   BUN 19 04/14/2018   CO2 29 04/14/2018   TSH 1.04 04/14/2018   INR 0.9 04/14/2007   HGBA1C 6.0 04/14/2018    ASSESSMENT AND PLAN:  Discussed the following assessment and plan:  Pounding heartbeat  Shortness of breath spells  lasting les than 15 minutes  - assoc with flushes  and sometimes   near syncope   Heart burn She is having spells that are associated with pounding heart rate dyspnea on exertion shortness of breath and feeling hot in her upper body.  Uncertain if she is having chest pain related to this but has described heartburn. She is well in between episodes  These spells are becoming more frequent and is very hard to evaluate over video visit.  I am concerned that she could have a cardiac cause as she does not think it is a panic attack.  Options discussed for evaluation during the COVID restrictions.  Because these are frequent I referred her to [facility] emergency room to evaluate for cardiovascular causes of her episodes more frequent.  Expectant management.  If she has any acute attack to call 911.  She had respiratory sx weeks ago but   Recovered    Expectant management and discussion of plan and treatment with patient with opportunity to ask questions and all were answered. The patient agreed with the plan and demonstrated an understanding of the instructions.   Shanon Ace, MD

## 2018-11-24 ENCOUNTER — Encounter (HOSPITAL_COMMUNITY): Payer: Self-pay | Admitting: Emergency Medicine

## 2018-11-24 ENCOUNTER — Emergency Department (HOSPITAL_COMMUNITY)
Admission: EM | Admit: 2018-11-24 | Discharge: 2018-11-24 | Disposition: A | Discharge status: Home / Self Care | Payer: Medicare Other | Attending: Emergency Medicine | Admitting: Emergency Medicine

## 2018-11-24 ENCOUNTER — Encounter: Payer: Self-pay | Admitting: Internal Medicine

## 2018-11-24 ENCOUNTER — Emergency Department (HOSPITAL_COMMUNITY): Payer: Medicare Other

## 2018-11-24 ENCOUNTER — Other Ambulatory Visit: Payer: Self-pay

## 2018-11-24 ENCOUNTER — Ambulatory Visit (INDEPENDENT_AMBULATORY_CARE_PROVIDER_SITE_OTHER): Payer: Medicare Other | Admitting: Internal Medicine

## 2018-11-24 DIAGNOSIS — R002 Palpitations: Secondary | ICD-10-CM

## 2018-11-24 DIAGNOSIS — R12 Heartburn: Secondary | ICD-10-CM

## 2018-11-24 DIAGNOSIS — E039 Hypothyroidism, unspecified: Secondary | ICD-10-CM | POA: Diagnosis not present

## 2018-11-24 DIAGNOSIS — R079 Chest pain, unspecified: Secondary | ICD-10-CM | POA: Diagnosis not present

## 2018-11-24 DIAGNOSIS — Z79899 Other long term (current) drug therapy: Secondary | ICD-10-CM | POA: Diagnosis not present

## 2018-11-24 DIAGNOSIS — R52 Pain, unspecified: Secondary | ICD-10-CM | POA: Diagnosis not present

## 2018-11-24 DIAGNOSIS — Z87891 Personal history of nicotine dependence: Secondary | ICD-10-CM | POA: Insufficient documentation

## 2018-11-24 DIAGNOSIS — I10 Essential (primary) hypertension: Secondary | ICD-10-CM | POA: Diagnosis not present

## 2018-11-24 DIAGNOSIS — R0602 Shortness of breath: Secondary | ICD-10-CM

## 2018-11-24 LAB — COMPREHENSIVE METABOLIC PANEL
ALT: 13 U/L (ref 0–44)
AST: 19 U/L (ref 15–41)
Albumin: 3.7 g/dL (ref 3.5–5.0)
Alkaline Phosphatase: 49 U/L (ref 38–126)
Anion gap: 14 (ref 5–15)
BUN: 14 mg/dL (ref 8–23)
CO2: 20 mmol/L — ABNORMAL LOW (ref 22–32)
Calcium: 9.2 mg/dL (ref 8.9–10.3)
Chloride: 107 mmol/L (ref 98–111)
Creatinine, Ser: 0.89 mg/dL (ref 0.44–1.00)
GFR calc Af Amer: >60 mL/min (ref >60)
GFR calc non Af Amer: >60 mL/min (ref >60)
Glucose, Bld: 95 mg/dL (ref 70–99)
Potassium: 3.9 mmol/L (ref 3.5–5.1)
Sodium: 141 mmol/L (ref 135–145)
Total Bilirubin: 0.6 mg/dL (ref 0.3–1.2)
Total Protein: 6.9 g/dL (ref 6.5–8.1)

## 2018-11-24 LAB — CBC WITH DIFFERENTIAL/PLATELET
Abs Immature Granulocytes: 0.02 10*3/uL (ref 0.00–0.07)
Basophils Absolute: 0 10*3/uL (ref 0.0–0.1)
Basophils Relative: 1 %
Eosinophils Absolute: 0.1 10*3/uL (ref 0.0–0.5)
Eosinophils Relative: 1 %
HCT: 39.8 % (ref 36.0–46.0)
Hemoglobin: 11.8 g/dL — ABNORMAL LOW (ref 12.0–15.0)
Immature Granulocytes: 0 %
Lymphocytes Relative: 36 %
Lymphs Abs: 2 10*3/uL (ref 0.7–4.0)
MCH: 28.2 pg (ref 26.0–34.0)
MCHC: 29.6 g/dL — ABNORMAL LOW (ref 30.0–36.0)
MCV: 95 fL (ref 80.0–100.0)
Monocytes Absolute: 0.5 10*3/uL (ref 0.1–1.0)
Monocytes Relative: 8 %
Neutro Abs: 3 10*3/uL (ref 1.7–7.7)
Neutrophils Relative %: 54 %
Platelets: 199 10*3/uL (ref 150–400)
RBC: 4.19 MIL/uL (ref 3.87–5.11)
RDW: 13.2 % (ref 11.5–15.5)
WBC: 5.6 10*3/uL (ref 4.0–10.5)
nRBC: 0 % (ref 0.0–0.2)

## 2018-11-24 LAB — TSH: TSH: 0.509 u[IU]/mL (ref 0.350–4.500)

## 2018-11-24 LAB — T4, FREE: Free T4: 0.91 ng/dL (ref 0.82–1.77)

## 2018-11-24 LAB — TROPONIN I: Troponin I: <0.03 ng/mL (ref <0.03)

## 2018-11-24 LAB — LIPASE, BLOOD: Lipase: 40 U/L (ref 11–51)

## 2018-11-24 MED ORDER — FAMOTIDINE 20 MG PO TABS: 20.0000 mg | ORAL_TABLET | Freq: Two times a day (BID) | ORAL | 0 refills | Status: DC

## 2018-11-24 NOTE — ED Notes (Signed)
ED Provider at bedside. 

## 2018-11-24 NOTE — Discharge Instructions (Signed)
As discussed, your evaluation today has been largely reassuring.  But, it is important that you monitor your condition carefully, and do not hesitate to return to the ED if you develop new, or concerning changes in your condition. ? ?Otherwise, please follow-up with your physician for appropriate ongoing care. ? ?

## 2018-11-24 NOTE — ED Provider Notes (Signed)
[facility] Provider Note   CSN: [id number] Arrival date & time: 11/24/18  1139    History   Chief Complaint No chief complaint on file.   HPI [name] [name] is a 78 y.o. female.     HPI  Patient presents with concern of chest pain, episodic nausea, dizziness. Patient has history of hypertension, thyroid disease. She notes that she was in her usual state of health until about 1 month ago when she began having increasingly frequent hot flashes. These are described as spontaneously developing episodes of lightheadedness, dizziness, with an ascending discomfort from her epigastrium to her chest. No dyspnea, no cough, no fever, no vomiting. There is some nausea with each event. Events he is spontaneously, and currently she is in no discomfort. Today she spoke with her physician, due to increasingly severe episodes of the past few days.  She was sent here for evaluation.   Past Medical History:  Diagnosis Date   Abdominal pain     Neg ct [place] and mri of back    Adenomatous colon polyp    Allergy    Anemia    Anxiety    Cataract 2013   bilat removed    Colon polyp    Cough    ent evaluation   DJD (degenerative joint disease) of lumbar spine    Estrogen deficiency    Gastritis    GERD (gastroesophageal reflux disease)    PPJKDTOI(712.4)    Helicobacter pylori infection 10/13/2007   Qualifier: Diagnosis of  By: Marland Mcalpine     History of positive PPD 10/16/2011   HSV-2 infection    Hyperglycemia    Hyperlipidemia    no meds now   Hypertension    IBS (irritable bowel syndrome)    Leg cramps 11/05/2013   Appears to have been from the higher dose of diuretic better on low dose and magnesium.    Medication side effect - Propofol (burning vein) and prvastatin (myalgia) 04/08/2013   Pravastatin patient went off and rechallenged and symptoms recurred muscle cramps /foot cramps    Neuromuscular disorder  (HCC)    muscle cramps   Pulsatile tinnitus    had MRA and MPV nl mild carotid dopplers2010   Shingles 02/09/2011   Right upper face and eyelid but not involving the eye at this point.    Sleep apnea    does not use c pap   Thyroid disease    Vulvitis     Patient Active Problem List   Diagnosis Date Noted   Dermatofibroma 12/23/2017   Osteoarthritis of right hip 10/02/2017   Multinodular goiter 06/15/2015   Post-surgical hypothyroidism 06/15/2015   Leg pain, lateral 08/30/2014   Hyperglycemia 11/05/2013   Family history of Alzheimer's disease 11/05/2013   Memory problem 11/05/2013   Medication side effect - Propofol (burning vein) and prvastatin (myalgia) 04/08/2013   Lesion of spleen 04/08/2013   Arthritis of shoulder 11/03/2012   History of back surgery 11/03/2012   HSV-2 seropositive 02/17/2012   Medicare annual wellness visit, subsequent 10/16/2011   OSA (obstructive sleep apnea) 10/16/2011   History of positive PPD 10/16/2011   History of colonic polyps 07/18/2011   Dyspnea 10/18/2010   Heart burn 10/18/2010   Chest pain 10/18/2010   ADJUSTMENT DISORDER WITH ANXIETY 07/24/2010   CHEST WALL PAIN, ANTERIOR 07/24/2010   KNEE PAIN 04/04/2010   SLEEP DISORDER/DISTURBANCE 11/21/2009   LEG PAIN 02/28/2009   TINNITUS 05/11/2008   HEADACHE 02/07/2008  UNSPECIFIED VITAMIN D DEFICIENCY 02/05/2008   OBESITY, MILD 10/13/2007   ANEMIA 10/13/2007   GASTRITIS, CHRONIC 10/13/2007   DUODENITIS, WITHOUT HEMORRHAGE 10/13/2007   HIATAL HERNIA 10/13/2007   IRRITABLE BOWEL SYNDROME 10/13/2007   GRANULOMA 10/13/2007   RHINITIS 09/23/2007   FASTING HYPERGLYCEMIA 09/04/2007   HYPERLIPIDEMIA 04/07/2007   HYPERTENSION 04/07/2007   GERD 04/07/2007    Past Surgical History:  Procedure Laterality Date   ABDOMINAL HYSTERECTOMY  1982   fibroids   COLONOSCOPY  2003- 2102   adenomatous polyps   FINGER SURGERY     left middle A1pulley  release   isthmuectomy     thyroid surgery   LUMBAR SPINE SURGERY  05/1989   spine d/t tumor neurofibroma Quentin Cornwall   POLYPECTOMY     SHOULDER ARTHROSCOPY Bilateral    left, right 2017   THYROIDECTOMY     partial rt  ballen   TONSILLECTOMY     at age 7.    TOTAL HIP ARTHROPLASTY Right 10/02/2017   Procedure: RIGHT TOTAL HIP ARTHROPLASTY ANTERIOR APPROACH;  Surgeon: Rod Can, MD;  Location: [facility];  Service: Orthopedics;  Laterality: Right;  Needs RNFA   TUBAL LIGATION  1974     OB History    Gravida  3   Para  3   Term      Preterm      AB      Living        SAB      TAB      Ectopic      Multiple      Live Births               Home Medications    Prior to Admission medications   Medication Sig Start Date End Date Taking? Authorizing Provider  acetaminophen (TYLENOL) 500 MG tablet Take 500 mg by mouth every 6 (six) hours as needed for mild pain, moderate pain or headache.   Yes [provider]  fluticasone (FLONASE) 50 MCG/ACT nasal spray INHALE 2 SPRAYS IN EACH NOSTRIL EVERY DAY FOR RHINITIS Patient taking differently: Place 2 sprays into both nostrils as needed for allergies. FOR RHINITIS 08/20/18  Yes Panosh, Standley Brooking, MD  ibuprofen (ADVIL) 200 MG tablet Take 200 mg by mouth every 6 (six) hours as needed for mild pain or moderate pain.   Yes [provider]  SYNTHROID 100 MCG tablet TAKE 1 TABLET BY MOUTH EVERY DAY Patient taking differently: Take 100 mcg by mouth daily before breakfast.  11/05/18  Yes Panosh, Standley Brooking, MD  triamterene-hydrochlorothiazide (MAXZIDE-25) 37.5-25 MG tablet TAKE 1 TABLET BY MOUTH EVERY DAY Patient taking differently: Take 1 tablet by mouth daily.  11/02/18  Yes Panosh, Standley Brooking, MD  acyclovir (ZOVIRAX) 400 MG tablet Take PO BID PRN Patient not taking: Reported on 11/24/2018 06/10/12   Eldred Manges, MD  dicyclomine (BENTYL) 20 MG tablet Take 1 tablet (20 mg total) by mouth 4 (four) times daily -   before meals and at bedtime. Patient not taking: Reported on 11/24/2018 04/29/18   Levin Erp, PA  famotidine (PEPCID) 20 MG tablet Take 1 tablet (20 mg total) by mouth 2 (two) times daily. Take one tablet twice daily for two days 11/24/18   Carmin Muskrat, MD  ondansetron (ZOFRAN) 4 MG tablet Take 1 tablet (4 mg total) by mouth every 8 (eight) hours as needed for nausea or vomiting. Patient not taking: Reported on 11/24/2018 08/08/18   Tereasa Coop, PA-C  Family History Family History  Problem Relation Age of Onset   Alzheimer's disease Mother    Heart attack Father    Asthma Sister    Colon cancer Sister    Heart attack Brother    Colon polyps Sister    Colon cancer Sister    Colon cancer Paternal Aunt    Thyroid disease Sister        multinodular goiter   Stomach cancer Neg Hx    Breast cancer Neg Hx    Rectal cancer Neg Hx     Social History Social History   Tobacco Use   Smoking status: Former Smoker    Packs/day: 0.50    Years: 23.00    Pack years: 11.50    Types: Cigarettes    Last attempt to quit: 06/16/1984    Years since quitting: 34.4   Smokeless tobacco: Never Used  Substance Use Topics   Alcohol use: No   Drug use: No     Allergies   Lisinopril; Lyrica [pregabalin]; Pravastatin; Simvastatin; Codeine phosphate; Oxycodone hcl; and Strawberry extract   Review of Systems Review of Systems  Constitutional:       Per HPI, otherwise negative  HENT:       Per HPI, otherwise negative  Respiratory:       Per HPI, otherwise negative  Cardiovascular:       Per HPI, otherwise negative  Gastrointestinal: Negative for vomiting.  Endocrine:       Negative aside from HPI  Genitourinary:       Neg aside from HPI   Musculoskeletal:       Per HPI, otherwise negative  Skin: Negative.   Neurological: Positive for dizziness and light-headedness. Negative for syncope.     Physical Exam Updated Vital Signs BP (!) 141/66 (BP  Location: Left Arm)    Pulse (!) 57    Temp 98.6 F (37 C) (Oral)    Resp 20    Ht 5\' 5"  (1.651 m)    Wt 93.4 kg    SpO2 97%    BMI 34.28 kg/m   Physical Exam Vitals signs and nursing note reviewed.  Constitutional:      General: She is not in acute distress.    Appearance: She is well-developed.  HENT:     Head: Normocephalic and atraumatic.  Eyes:     Conjunctiva/sclera: Conjunctivae normal.  Cardiovascular:     Rate and Rhythm: Normal rate and regular rhythm.  Pulmonary:     Effort: Pulmonary effort is normal. No respiratory distress.     Breath sounds: Normal breath sounds. No stridor.  Abdominal:     General: There is no distension.  Skin:    General: Skin is warm and dry.  Neurological:     Mental Status: She is alert and oriented to person, place, and time.     Cranial Nerves: No cranial nerve deficit.      ED Treatments / Results  Labs (all labs ordered are listed, but only abnormal results are displayed) Labs Reviewed  COMPREHENSIVE METABOLIC PANEL - Abnormal; Notable for the following components:      Result Value   CO2 20 (*)    All other components within normal limits  CBC WITH DIFFERENTIAL/PLATELET - Abnormal; Notable for the following components:   Hemoglobin 11.8 (*)    MCHC 29.6 (*)    All other components within normal limits  LIPASE, BLOOD  TROPONIN I  TSH  T4, FREE    EKG  EKG Interpretation  Date/Time:  Tuesday November 24 2018 13:00:40 EDT Ventricular Rate:  62 PR Interval:    QRS Duration: 90 QT Interval:  436 QTC Calculation: 443 R Axis:   33 Text Interpretation:  Sinus rhythm Baseline wander Otherwise within normal limits Confirmed by Carmin Muskrat [phone]) on 11/24/2018 1:03:21 PM   Radiology Dg Chest 2 View  Result Date: 11/24/2018 CLINICAL DATA:  Chest pain and chills for the past 2 weeks. EXAM: CHEST - 2 VIEW COMPARISON:  Chest x-ray dated April 14, 2018. FINDINGS: The heart size and mediastinal contours are within normal  limits. Normal pulmonary vascularity. Atherosclerotic calcification of the aortic arch. Minimal subsegmental atelectasis in the left lower lobe. No focal consolidation, pleural effusion, or pneumothorax. Unchanged scattered calcified granulomas. Stable calcified density in the spleen. No acute osseous abnormality. IMPRESSION: No active cardiopulmonary disease. Electronically Signed   By: Titus Dubin M.D.   On: 11/24/2018 12:51    Procedures Procedures (including critical care time)  Medications Ordered in ED Medications - No data to display   Initial Impression / Assessment and Plan / ED Course  I have reviewed the triage vital signs and the nursing notes.  Pertinent labs & imaging results that were available during my care of the patient were reviewed by me and considered in my medical decision making (see chart for details).        3:30 PM On reexam the patient is awake, alert, in no distress. She is hemodynamically unremarkable. We had a lengthy conversation about all findings.  X-ray consistent with pneumonia Labs consistent with ACS No evidence for substantial thyroid dysfunction. She now notes that she had a fall about 2 weeks ago, which may have actually preceded the development of her discomfort, though she is unsure. She recalls falling against the edge of a stepladder.  No evidence for rib fracture on x-ray, nor any hypoxia or evidence for pneumothorax. Other considerations include gastroesophageal given her description of a sending discomfort.  Patient is not currently on therapy for this, will start short course of therapy, will follow up with primary care.  Final Clinical Impressions(s) / ED Diagnoses   Final diagnoses:  Pain    ED Discharge Orders         Ordered    famotidine (PEPCID) 20 MG tablet  2 times daily     11/24/18 1526           Carmin Muskrat, MD 11/24/18 1531

## 2018-11-24 NOTE — ED Triage Notes (Addendum)
Sent by PCP to evaluate "hot flashes" that begin in her lower abdominal area and radiates to her head, happens intermittently and causes dizziness and nausea x1 month.  Also c/o SOB and a heaviness in her chest.

## 2018-11-30 NOTE — Progress Notes (Signed)
Virtual Visit via Video Note  I connected with@ on 12/01/18 at  9:45 AM EDT by a video enabled telemedicine application and verified that I am speaking with the correct person using two identifiers. Location patient: home Location provider: home office Persons participating in the virtual visit: patient, provider  [place] national recommendations  regarding COVID 19 pandemic   video visit is advised over in office visit for this patient.  Discussed the limitations of evaluation and management by telemedicine and  availability of in person appointments. The patient expressed understanding and agreed to proceed. Patients  Phone   Signal was poor so  The visit was finished ont he phone  But  Visual and audio  inatact at initial part of visit .    HPI: [name] [name] F/U ed for atypical spells cp sob  fetl not to be from ovious cardiac cause    Given famotidine bid for 5 days and finished this  Since that visit  Only one episode but is getting anxious about the spells.    ocass cough feels sob when happens? No syncope.   Has ? About  Heart  As sx of  Prodrome of heart attacks .  Had an episode of sob related toi  Pillow when   ROS: See pertinent positives and negatives per HPI. Gets chest pressure  Also    Past Medical History:  Diagnosis Date  . Abdominal pain     Neg ct [place] and mri of back   . Adenomatous colon polyp   . Allergy   . Anemia   . Anxiety   . Cataract 2013   bilat removed   . Colon polyp   . Cough    ent evaluation  . DJD (degenerative joint disease) of lumbar spine   . Estrogen deficiency   . Gastritis   . GERD (gastroesophageal reflux disease)   . Headache(784.0)   . Helicobacter pylori infection 10/13/2007   Qualifier: Diagnosis of  By: Marland Mcalpine    . History of positive PPD 10/16/2011  . HSV-2 infection   . Hyperglycemia   . Hyperlipidemia    no meds now  . Hypertension   . IBS (irritable bowel syndrome)   . Leg cramps 11/05/2013   Appears to have  been from the higher dose of diuretic better on low dose and magnesium.   . Medication side effect - Propofol (burning vein) and prvastatin (myalgia) 04/08/2013   Pravastatin patient went off and rechallenged and symptoms recurred muscle cramps /foot cramps   . Neuromuscular disorder (HCC)    muscle cramps  . Pulsatile tinnitus    had MRA and MPV nl mild carotid dopplers2010  . Shingles 02/09/2011   Right upper face and eyelid but not involving the eye at this point.   . Sleep apnea    does not use c pap  . Thyroid disease   . Vulvitis     Past Surgical History:  Procedure Laterality Date  . ABDOMINAL HYSTERECTOMY  1982   fibroids  . COLONOSCOPY  2003- 2102   adenomatous polyps  . FINGER SURGERY     left middle A1pulley release  . isthmuectomy     thyroid surgery  . LUMBAR SPINE SURGERY  05/1989   spine d/t tumor neurofibroma Quentin Cornwall  . POLYPECTOMY    . SHOULDER ARTHROSCOPY Bilateral    left, right 2017  . THYROIDECTOMY     partial rt  ballen  . TONSILLECTOMY     at  age 44.   Marland Kitchen TOTAL HIP ARTHROPLASTY Right 10/02/2017   Procedure: RIGHT TOTAL HIP ARTHROPLASTY ANTERIOR APPROACH;  Surgeon: Rod Can, MD;  Location: [facility];  Service: Orthopedics;  Laterality: Right;  Needs RNFA  . TUBAL LIGATION  1974    Family History  Problem Relation Age of Onset  . Alzheimer's disease Mother   . Heart attack Father   . Asthma Sister   . Colon cancer Sister   . Heart attack Brother   . Colon polyps Sister   . Colon cancer Sister   . Colon cancer Paternal Aunt   . Thyroid disease Sister        multinodular goiter  . Stomach cancer Neg Hx   . Breast cancer Neg Hx   . Rectal cancer Neg Hx     Social History   Tobacco Use  . Smoking status: Former Smoker    Packs/day: 0.50    Years: 23.00    Pack years: 11.50    Types: Cigarettes    Last attempt to quit: 06/16/1984    Years since quitting: 34.4  . Smokeless tobacco: Never Used  Substance Use Topics  . Alcohol use: No   . Drug use: No      Current Outpatient Medications:  .  acetaminophen (TYLENOL) 500 MG tablet, Take 500 mg by mouth every 6 (six) hours as needed for mild pain, moderate pain or headache., Disp: , Rfl:  .  acyclovir (ZOVIRAX) 400 MG tablet, Take PO BID PRN (Patient not taking: Reported on 11/24/2018), Disp: 60 tablet, Rfl: 12 .  dicyclomine (BENTYL) 20 MG tablet, Take 1 tablet (20 mg total) by mouth 4 (four) times daily -  before meals and at bedtime. (Patient not taking: Reported on 11/24/2018), Disp: 120 tablet, Rfl: 1 .  famotidine (PEPCID) 20 MG tablet, Take 1 tablet (20 mg total) by mouth 2 (two) times daily., Disp: 60 tablet, Rfl: 1 .  fluticasone (FLONASE) 50 MCG/ACT nasal spray, INHALE 2 SPRAYS IN EACH NOSTRIL EVERY DAY FOR RHINITIS (Patient taking differently: Place 2 sprays into both nostrils as needed for allergies. FOR RHINITIS), Disp: 16 g, Rfl: 5 .  ibuprofen (ADVIL) 200 MG tablet, Take 200 mg by mouth every 6 (six) hours as needed for mild pain or moderate pain., Disp: , Rfl:  .  LORazepam (ATIVAN) 0.5 MG tablet, Take 1-2 by mouth AS NEEDED FOR ANXIETY  Up to every  8 hours, Disp: 15 tablet, Rfl: 0 .  ondansetron (ZOFRAN) 4 MG tablet, Take 1 tablet (4 mg total) by mouth every 8 (eight) hours as needed for nausea or vomiting. (Patient not taking: Reported on 11/24/2018), Disp: 20 tablet, Rfl: 0 .  SYNTHROID 100 MCG tablet, TAKE 1 TABLET BY MOUTH EVERY DAY (Patient taking differently: Take 100 mcg by mouth daily before breakfast. ), Disp: 90 tablet, Rfl: 1 .  triamterene-hydrochlorothiazide (MAXZIDE-25) 37.5-25 MG tablet, TAKE 1 TABLET BY MOUTH EVERY DAY (Patient taking differently: Take 1 tablet by mouth daily. ), Disp: 90 tablet, Rfl: 1  EXAM: BP Readings from Last 3 Encounters:  11/24/18 (!) 141/66  10/29/18 128/74  08/20/18 132/84    VITALS per patient if applicable:  GENERAL: alert, oriented, appears well and in no acute distress looks non toxic well mostly no cough or sob  during  Visit   HEENT: atraumatic, conjunttiva clear, no obvious abnormalities on inspection of external nose and ears  NECK: normal movements of the head and neck  LUNGS: on inspection no signs of  respiratory distress, breathing rate appears normal, no obvious gross SOB, gasping or wheezing  CV: no obvious cyanosis  MS: moves all visible extremities without noticeable abnormality  PSYCH/NEURO: pleasant and cooperative,moidestly anxious  But , speech and thought processing grossly intact Lab Results  Component Value Date   WBC 5.6 11/24/2018   HGB 11.8 (L) 11/24/2018   HCT 39.8 11/24/2018   PLT 199 11/24/2018   GLUCOSE 95 11/24/2018   CHOL 208 (H) 04/14/2018   TRIG 80.0 04/14/2018   HDL 67.70 04/14/2018   LDLDIRECT 131.1 08/09/2009   LDLCALC 124 (H) 04/14/2018   ALT 13 11/24/2018   AST 19 11/24/2018   NA 141 11/24/2018   K 3.9 11/24/2018   CL 107 11/24/2018   CREATININE 0.89 11/24/2018   BUN 14 11/24/2018   CO2 20 (L) 11/24/2018   TSH 0.509 11/24/2018   INR 0.9 04/14/2007   HGBA1C 6.0 04/14/2018   Wt Readings from Last 3 Encounters:  11/24/18 206 lb (93.4 kg)  08/20/18 202 lb 14.4 oz (92 kg)  08/08/18 197 lb (89.4 kg)    ASSESSMENT AND PLAN:  Discussed the following assessment and plan:  Shortness of breath spells  lasting les than 15 minutes  - Plan: Ambulatory referral to Cardiology  Respiratory symptoms  Heart burn  Sensation of feeling hot - Plan: Ambulatory referral to Cardiology  Anxiety about health  symptoms   Chest tightness - Plan: Ambulatory referral to Cardiology  Spells are a bit less  Have her stay on  Famotidine for next weeks    fortunately ed evaluation was negative for alarm conditions .  Disc with patient that many things could cause her sx  Including cv cause but ed visit was reassuring  She is having secondary anxiety about these sx     Disc  Use of prn  benzo   Lorazepam with cautions   Referral to cardiology R/o  cv cause  of  spells including arrhythmias  .   Uncertain  How they are  Processing referrals but first available  Otherwise dr Tamala Julian preferred  Counseled.   Plan rov vv or other in 3-4 weeks  .  Disc technical  Phone signal was poor   Expectant management and discussion of plan and treatment with patient with opportunity to ask questions and all were answered. The patient agreed with the plan and demonstrated an understanding of the instructions.   The patient was advised to call back or seek an in-person evaluation if worsening  or having concerns . Otherwise   As above   I provided  35 minutes of non-face-to-face time during this encounter.   Shanon Ace, MD

## 2018-12-01 ENCOUNTER — Ambulatory Visit (INDEPENDENT_AMBULATORY_CARE_PROVIDER_SITE_OTHER): Payer: Medicare Other | Admitting: Internal Medicine

## 2018-12-01 ENCOUNTER — Other Ambulatory Visit: Payer: Self-pay

## 2018-12-01 ENCOUNTER — Encounter: Payer: Self-pay | Admitting: Internal Medicine

## 2018-12-01 DIAGNOSIS — R12 Heartburn: Secondary | ICD-10-CM

## 2018-12-01 DIAGNOSIS — R0602 Shortness of breath: Secondary | ICD-10-CM

## 2018-12-01 DIAGNOSIS — F418 Other specified anxiety disorders: Secondary | ICD-10-CM

## 2018-12-01 DIAGNOSIS — R0789 Other chest pain: Secondary | ICD-10-CM

## 2018-12-01 DIAGNOSIS — R6889 Other general symptoms and signs: Secondary | ICD-10-CM

## 2018-12-01 DIAGNOSIS — R0989 Other specified symptoms and signs involving the circulatory and respiratory systems: Secondary | ICD-10-CM

## 2018-12-01 MED ORDER — FAMOTIDINE 20 MG PO TABS: 20.0000 mg | ORAL_TABLET | Freq: Two times a day (BID) | ORAL | 1 refills | Status: DC

## 2018-12-01 MED ORDER — LORAZEPAM 0.5 MG PO TABS: ORAL_TABLET | ORAL | 0 refills | Status: DC

## 2018-12-04 ENCOUNTER — Telehealth: Payer: Self-pay | Admitting: Cardiology

## 2018-12-04 NOTE — Telephone Encounter (Signed)
Mychart, smartphone, pre reg complete 12/04/18 AF °

## 2018-12-05 ENCOUNTER — Encounter: Payer: Self-pay | Admitting: Cardiology

## 2018-12-05 NOTE — Progress Notes (Signed)
Virtual Visit via Video Note   This visit type was conducted due to national recommendations for restrictions regarding the COVID-19 Pandemic (e.g. social distancing) in an effort to limit this patient's exposure and mitigate transmission in our community.  Due to her co-morbid illnesses, this patient is at least at moderate risk for complications without adequate follow up.  This format is felt to be most appropriate for this patient at this time.  All issues noted in this document were discussed and addressed.  A limited physical exam was performed with this format.  Please refer to the patient's chart for her consent to telehealth for [facility].   Evaluation Performed:  New patient  Date:  12/07/2018   ID:  [name] [name], DOB Apr 14, 1941, MRN [id number]  Patient Location: Home Provider Location: Home  PCP:  Burnis Medin, MD  Cardiologist:  Minus Breeding, MD  Electrophysiologist:  None   Chief Complaint:  Chest pain   History of Present Illness:    [name] [name] is a 78 y.o. female with chest pain and dizziness  She had a virtual visit with Dr. Regis Bill recently and was complaining of spells of chest tightness and SOB.  She was referred for this visit.  She was in the ED 4/14 as well for dizziness and light headedness and I reviewed these records.   She eventually remembered that she slipped on a ladder.   Her symptoms are somewhat difficult to sort out.  She points to her mid sternum.  She feels like she pulled something.  She feels the discomfort that goes to her left breast.  This happens when she moves a certain way.  She does not describe substernal discomfort.  She does not describe discomfort going up to her jaw or down into her arms.  She feels like it is a 9 out of 10 at times.  She also feels dizziness but she is not describing palpitations.  She thinks some of this happens when she drinks something hot.  She feels some heartburn.  She has some cough.  She has  noticed that with her discomfort she feels better if she does not drink something hot and if she takes Tylenol.  She took some Motrin and might of had some improvement.  She said these were the symptoms she was having in April when she went to the emergency room as well.  At that time she was found to not have any acute EKG changes.  I reviewed these records.  And were negative.  There had been some recent trauma.  She still has some discomfort to palpation that reproduces the symptoms.  The patient does have symptoms concerning for COVID-19 infection (fever, chills, cough, or new shortness of breath).    Past Medical History:  Diagnosis Date  . Adenomatous colon polyp   . Anxiety   . Cataract 2013   bilat removed   . DJD (degenerative joint disease) of lumbar spine   . Estrogen deficiency   . Gastritis   . GERD (gastroesophageal reflux disease)   . Headache(784.0)   . Helicobacter pylori infection 10/13/2007   Qualifier: Diagnosis of  By: Marland Mcalpine    . History of positive PPD 10/16/2011  . HSV-2 infection   . Hyperglycemia   . Hyperlipidemia    no meds now  . Hypertension   . IBS (irritable bowel syndrome)   . Medication side effect - Propofol (burning vein) and prvastatin (myalgia) 04/08/2013   Pravastatin  patient went off and rechallenged and symptoms recurred muscle cramps /foot cramps   . Neuromuscular disorder (HCC)    muscle cramps  . Pulsatile tinnitus    had MRA and MPV nl mild carotid dopplers2010  . Shingles 02/09/2011   Right upper face and eyelid but not involving the eye at this point.   . Sleep apnea    does not use c pap  . Thyroid disease   . Vulvitis    Past Surgical History:  Procedure Laterality Date  . ABDOMINAL HYSTERECTOMY  1982   fibroids  . COLONOSCOPY  2003- 2102   adenomatous polyps  . FINGER SURGERY     left middle A1pulley release  . isthmuectomy     thyroid surgery  . LUMBAR SPINE SURGERY  05/1989   spine d/t tumor neurofibroma  Quentin Cornwall  . POLYPECTOMY    . SHOULDER ARTHROSCOPY Bilateral    left, right 2017  . THYROIDECTOMY     partial rt  ballen  . TONSILLECTOMY     at age 75.   Marland Kitchen TOTAL HIP ARTHROPLASTY Right 10/02/2017   Procedure: RIGHT TOTAL HIP ARTHROPLASTY ANTERIOR APPROACH;  Surgeon: Rod Can, MD;  Location: [facility];  Service: Orthopedics;  Laterality: Right;  Needs RNFA  . TUBAL LIGATION  1974     Current Meds  Medication Sig  . acetaminophen (TYLENOL) 500 MG tablet Take 500 mg by mouth every 6 (six) hours as needed for mild pain, moderate pain or headache.  . famotidine (PEPCID) 20 MG tablet Take 1 tablet (20 mg total) by mouth 2 (two) times daily.  . fluticasone (FLONASE) 50 MCG/ACT nasal spray INHALE 2 SPRAYS IN EACH NOSTRIL EVERY DAY FOR RHINITIS (Patient taking differently: Place 2 sprays into both nostrils as needed for allergies. FOR RHINITIS)  . ibuprofen (ADVIL) 200 MG tablet Take 200 mg by mouth every 6 (six) hours as needed for mild pain or moderate pain.  Marland Kitchen SYNTHROID 100 MCG tablet TAKE 1 TABLET BY MOUTH EVERY DAY (Patient taking differently: Take 100 mcg by mouth daily before breakfast. )  . triamterene-hydrochlorothiazide (MAXZIDE-25) 37.5-25 MG tablet TAKE 1 TABLET BY MOUTH EVERY DAY (Patient taking differently: Take 1 tablet by mouth daily. )     Allergies:   Lisinopril; Lyrica [pregabalin]; Pravastatin; Simvastatin; Codeine phosphate; Oxycodone hcl; and Strawberry extract   Social History   Tobacco Use  . Smoking status: Former Smoker    Packs/day: 0.50    Years: 23.00    Pack years: 11.50    Types: Cigarettes    Last attempt to quit: 06/16/1984    Years since quitting: 34.4  . Smokeless tobacco: Never Used  Substance Use Topics  . Alcohol use: No  . Drug use: No     Family Hx: The patient's family history includes Alzheimer's disease in her mother; Asthma in her sister; Colon cancer in her paternal aunt, sister, and sister; Colon polyps in her sister; Heart attack in  her brother and father; Thyroid disease in her sister. There is no history of Stomach cancer, Breast cancer, or Rectal cancer.  ROS:   Please see the history of present illness.    As stated in the HPI and negative for all other systems.  Prior CV studies:   The following studies were reviewed today:  ED records  Labs/Other Tests and Data Reviewed:    EKG:  NSR, rate 62, axis and intervals WNL.  No acute ST T wave changes  Recent Labs: 11/24/2018: ALT 13; BUN  14; Creatinine, Ser 0.89; Hemoglobin 11.8; Platelets 199; Potassium 3.9; Sodium 141; TSH 0.509   Recent Lipid Panel Lab Results  Component Value Date/Time   CHOL 208 (H) 04/14/2018 10:33 AM   TRIG 80.0 04/14/2018 10:33 AM   HDL 67.70 04/14/2018 10:33 AM   CHOLHDL 3 04/14/2018 10:33 AM   LDLCALC 124 (H) 04/14/2018 10:33 AM   LDLDIRECT 131.1 08/09/2009 09:29 AM    Wt Readings from Last 3 Encounters:  12/07/18 197 lb (89.4 kg)  11/24/18 206 lb (93.4 kg)  08/20/18 202 lb 14.4 oz (92 kg)     Objective:    Vital Signs:  BP 121/62   Ht 5\' 5"  (1.651 m)   Wt 197 lb (89.4 kg)   BMI 32.78 kg/m    VITAL SIGNS:  reviewed GEN:  no acute distress EYES:  sclerae anicteric, EOMI - Extraocular Movements Intact NEURO:  alert and oriented x 3, no obvious focal deficit PSYCH:  normal affect  ASSESSMENT & PLAN:    CHEST PAIN:  Her chest pain is very atypical.  It is reproducible with palpation.  At this point no further cardiac work-up is suggested.  Her symptoms were quite atypical and they were evaluated in the emergency room.  There was no clear etiology.  She will continue to take Motrin or Tylenol.  However, she is to call me back if these get worse or do not improve.  My plan will be to see her back in the office for routine follow-up in 2 months given the fact that she has lots of vague complaints that are difficult to sort out.  DIZZINESS: This seems to be associated with drinking hot beverages and she knows that she can  avoid this and have improvement.  She will avoid the hot beverages.  If she has continued symptoms she might need an event monitor.  COVID-19 Education: The signs and symptoms of COVID-19 were discussed with the patient and how to seek care for testing (follow up with PCP or arrange E-visit).  The importance of social distancing was discussed today.  Time:   Today, I have spent 25 minutes with the patient with telehealth technology discussing the above problems.     Medication Adjustments/Labs and Tests Ordered: Current medicines are reviewed at length with the patient today.  Concerns regarding medicines are outlined above.   Tests Ordered: No orders of the defined types were placed in this encounter.   Medication Changes: No orders of the defined types were placed in this encounter.   Disposition:  Follow up 2 months  Signed, Minus Breeding, MD  12/07/2018 12:54 PM    [place]

## 2018-12-06 ENCOUNTER — Telehealth: Payer: Self-pay | Admitting: Cardiology

## 2018-12-06 NOTE — Telephone Encounter (Signed)
Pt called in reporting hot flashes and flushed feelings. States she has been referred to Cardiology and has an appt tomorrow scheduled. Has been having episodes of chest discomfort but mostly flushed feelings. This morning had another episode around 7:30am. Reports her episodes of chest pain have actually improved when she takes Advil or Motrin. Thinks she may have pulled a muscle in her chest. I advised she keep her scheduled appt. If she has more worrisome symptoms or recurrent episodes would need to present to the ED and not wait for appt. She understood and thanked me for the follow up call.   Reino Bellis

## 2018-12-07 ENCOUNTER — Telehealth (INDEPENDENT_AMBULATORY_CARE_PROVIDER_SITE_OTHER): Payer: Medicare Other | Admitting: Cardiology

## 2018-12-07 ENCOUNTER — Encounter: Payer: Self-pay | Admitting: Cardiology

## 2018-12-07 VITALS — BP 121/62 | Ht 65.0 in | Wt 197.0 lb

## 2018-12-07 DIAGNOSIS — R42 Dizziness and giddiness: Secondary | ICD-10-CM | POA: Insufficient documentation

## 2018-12-07 DIAGNOSIS — Z7189 Other specified counseling: Secondary | ICD-10-CM | POA: Diagnosis not present

## 2018-12-07 DIAGNOSIS — R072 Precordial pain: Secondary | ICD-10-CM

## 2018-12-07 NOTE — Patient Instructions (Addendum)
Medication Instructions:  Continue current medications  If you need a refill on your cardiac medications before your next appointment, please call your pharmacy.  Labwork: None Ordered   Testing/Procedures: None Ordered  Follow-Up: .    Your physician recommends that you schedule a follow-up appointment in: June 29th @ 9:00 am   At [facility], you and your health needs are our priority.  As part of our continuing mission to provide you with exceptional heart care, we have created designated Provider Care Teams.  These Care Teams include your primary Cardiologist (physician) and Advanced Practice Providers (APPs -  Physician Assistants and Nurse Practitioners) who all work together to provide you with the care you need, when you need it.  Thank you for choosing [facility] at [facility]!!

## 2018-12-28 ENCOUNTER — Telehealth: Payer: Self-pay | Admitting: Cardiology

## 2018-12-28 NOTE — Telephone Encounter (Signed)
New message:    Patient calling to see if she can come in to see the doctor she has a burn she is still concern about. Please call patient.

## 2018-12-28 NOTE — Telephone Encounter (Signed)
I will see her at 3:20 in the office on Friday.

## 2018-12-28 NOTE — Telephone Encounter (Signed)
Spoke with pt for a lengthy time and pt continues to note chest burning on left side especially to left breast Pt has continued with the Motrin and Tylenol and has helped somewhat.Per pt does not note daily has "good days" Pt did however note yesterday went for short walk and noted SOB on small incline Pt would like an earlier appt Will forward to Dr Percival Spanish for review and recommendations./cy

## 2018-12-31 NOTE — Telephone Encounter (Signed)
Called pt several times with no answer, leave ,message for pt to call back

## 2019-01-01 ENCOUNTER — Ambulatory Visit (INDEPENDENT_AMBULATORY_CARE_PROVIDER_SITE_OTHER): Payer: Medicare Other | Admitting: Cardiology

## 2019-01-01 ENCOUNTER — Encounter: Payer: Self-pay | Admitting: Cardiology

## 2019-01-01 ENCOUNTER — Other Ambulatory Visit: Payer: Self-pay

## 2019-01-01 VITALS — BP 126/79 | HR 91 | Ht 65.0 in | Wt 203.8 lb

## 2019-01-01 DIAGNOSIS — R072 Precordial pain: Secondary | ICD-10-CM

## 2019-01-01 DIAGNOSIS — R42 Dizziness and giddiness: Secondary | ICD-10-CM | POA: Diagnosis not present

## 2019-01-01 NOTE — Telephone Encounter (Signed)
Pt had OV today with Dr Percival Spanish

## 2019-01-01 NOTE — Patient Instructions (Signed)
Medication Instructions:  Continue current medications  If you need a refill on your cardiac medications before your next appointment, please call your pharmacy.  Labwork: None Ordered   Testing/Procedures: None ordered  Follow-Up: Your physician recommends that you schedule a follow-up appointment in: As Needed  At [facility], you and your health needs are our priority.  As part of our continuing mission to provide you with exceptional heart care, we have created designated Provider Care Teams.  These Care Teams include your primary Cardiologist (physician) and Advanced Practice Providers (APPs -  Physician Assistants and Nurse Practitioners) who all work together to provide you with the care you need, when you need it.  Thank you for choosing [facility] at [facility]!!

## 2019-01-01 NOTE — Progress Notes (Signed)
Cardiology Office Note   Date:  01/01/2019   ID:  JLEE HARKLESS, DOB 04-17-1941, MRN [id number]  PCP:  Burnis Medin, MD  Cardiologist:   Minus Breeding, MD   Chief Complaint  Patient presents with  . Chest Pain      History of Present Illness: [name] [name] is a 78 y.o. female who presents for evaluation of chest discomfort.  It was very difficult to assess.  I did a telehealth visit.   She describes lots of symptoms as described in the previous note.  Some of this seems to have occurred after she reached over and pulled something.  She describes some muscle pain.  She thought this was affecting her heart.  She describes a lot of symptoms related to things that she eats and hot things that she drinks.  She actually says that all of this is gotten better since she stopped drinking hot fluid.  I am trying to pick through this and carefully questioned her it does not seem like she has any substernal chest pressure, neck or arm discomfort.  Does not seem that she has any palpitations, presyncope or syncope.  She has no weight gain or edema.  I brought her back to try to discuss this in person.   The patient does have symptoms concerning for COVID-19 infection (fever, chills, cough, or new shortness of breath).    Past Medical History:  Diagnosis Date  . Adenomatous colon polyp   . Anxiety   . Cataract 2013   bilat removed   . DJD (degenerative joint disease) of lumbar spine   . Estrogen deficiency   . Gastritis   . GERD (gastroesophageal reflux disease)   . Headache(784.0)   . Helicobacter pylori infection 10/13/2007   Qualifier: Diagnosis of  By: Marland Mcalpine    . History of positive PPD 10/16/2011  . HSV-2 infection   . Hyperglycemia   . Hyperlipidemia    no meds now  . Hypertension   . IBS (irritable bowel syndrome)   . Medication side effect - Propofol (burning vein) and prvastatin (myalgia) 04/08/2013   Pravastatin patient went off and rechallenged and  symptoms recurred muscle cramps /foot cramps   . Neuromuscular disorder (HCC)    muscle cramps  . Pulsatile tinnitus    had MRA and MPV nl mild carotid dopplers2010  . Shingles 02/09/2011   Right upper face and eyelid but not involving the eye at this point.   . Sleep apnea    does not use c pap  . Thyroid disease   . Vulvitis     Past Surgical History:  Procedure Laterality Date  . ABDOMINAL HYSTERECTOMY  1982   fibroids  . COLONOSCOPY  2003- 2102   adenomatous polyps  . FINGER SURGERY     left middle A1pulley release  . isthmuectomy     thyroid surgery  . LUMBAR SPINE SURGERY  05/1989   spine d/t tumor neurofibroma Quentin Cornwall  . POLYPECTOMY    . SHOULDER ARTHROSCOPY Bilateral    left, right 2017  . THYROIDECTOMY     partial rt  ballen  . TONSILLECTOMY     at age 73.   Marland Kitchen TOTAL HIP ARTHROPLASTY Right 10/02/2017   Procedure: RIGHT TOTAL HIP ARTHROPLASTY ANTERIOR APPROACH;  Surgeon: Rod Can, MD;  Location: [facility];  Service: Orthopedics;  Laterality: Right;  Needs RNFA  . TUBAL LIGATION  1974     Current Outpatient Medications  Medication Sig  Dispense Refill  . acetaminophen (TYLENOL) 500 MG tablet Take 500 mg by mouth every 6 (six) hours as needed for mild pain, moderate pain or headache.    . famotidine (PEPCID) 20 MG tablet Take 1 tablet (20 mg total) by mouth 2 (two) times daily. 60 tablet 1  . fluticasone (FLONASE) 50 MCG/ACT nasal spray INHALE 2 SPRAYS IN EACH NOSTRIL EVERY DAY FOR RHINITIS (Patient taking differently: Place 2 sprays into both nostrils as needed for allergies. FOR RHINITIS) 16 g 5  . ibuprofen (ADVIL) 200 MG tablet Take 200 mg by mouth every 6 (six) hours as needed for mild pain or moderate pain.    Marland Kitchen SYNTHROID 100 MCG tablet TAKE 1 TABLET BY MOUTH EVERY DAY (Patient taking differently: Take 100 mcg by mouth daily before breakfast. ) 90 tablet 1  . triamterene-hydrochlorothiazide (MAXZIDE-25) 37.5-25 MG tablet TAKE 1 TABLET BY MOUTH EVERY DAY  (Patient taking differently: Take 1 tablet by mouth daily. ) 90 tablet 1   No current facility-administered medications for this visit.     Allergies:   Lisinopril; Lyrica [pregabalin]; Pravastatin; Simvastatin; Codeine phosphate; Oxycodone hcl; and Strawberry extract    ROS:  Please see the history of present illness.   Otherwise, review of systems are positive for headache, dizziness.   All other systems are reviewed and negative.    PHYSICAL EXAM: VS:  BP 126/79   Pulse 91   Ht 5\' 5"  (1.651 m)   Wt 203 lb 12.8 oz (92.4 kg)   BMI 33.91 kg/m  , BMI Body mass index is 33.91 kg/m. GENERAL:  Well appearing NECK:  No jugular venous distention, waveform within normal limits, carotid upstroke brisk and symmetric, no bruits, no thyromegaly LUNGS:  Clear to auscultation bilaterally BACK:  No CVA tenderness CHEST:  Unremarkable HEART:  PMI not displaced or sustained,S1 and S2 within normal limits, no S3, no S4, no clicks, no rubs, no murmurs ABD:  Flat, positive bowel sounds normal in frequency in pitch, no bruits, no rebound, no guarding, no midline pulsatile mass, no hepatomegaly, no splenomegaly EXT:  2 plus pulses throughout, no edema, no cyanosis no clubbing   EKG:  EKG is ordered today. The ekg ordered today demonstrates sinus rhythm, rate 91, axis within normal limits, intervals within normal limits, no acute ST-T wave changes.   Recent Labs: 11/24/2018: ALT 13; BUN 14; Creatinine, Ser 0.89; Hemoglobin 11.8; Platelets 199; Potassium 3.9; Sodium 141; TSH 0.509    Lipid Panel    Component Value Date/Time   CHOL 208 (H) 04/14/2018 1033   TRIG 80.0 04/14/2018 1033   HDL 67.70 04/14/2018 1033   CHOLHDL 3 04/14/2018 1033   VLDL 16.0 04/14/2018 1033   LDLCALC 124 (H) 04/14/2018 1033   LDLDIRECT 131.1 08/09/2009 0929      Wt Readings from Last 3 Encounters:  01/01/19 203 lb 12.8 oz (92.4 kg)  12/07/18 197 lb (89.4 kg)  11/24/18 206 lb (93.4 kg)      Other studies  Reviewed: Additional studies/ records that were reviewed today include: None. Review of the above records demonstrates:  Please see elsewhere in the note.     ASSESSMENT AND PLAN:   CHEST PAIN:  Her chest pain is very atypical.  There is no evidence of an ischemic etiology.  Her EKG was unremarkable.  Exam was unremarkable.  It sounds like she has combination musculoskeletal and GI complaints.  I would not suggest further cardiovascular testing at this point.  Of note she was  given a prescription for anxiolytic but she did not take this.  Clearly some of this is related to stress and anxiety and she talks about this quite a bit particularly related to COVID-19.  DIZZINESS:  This seems to be not as problematic.  She is not describing arrhythmias.  No further work-up is suggested.  Current medicines are reviewed at length with the patient today.  The patient does not have concerns regarding medicines.  The following changes have been made:  no change  Labs/ tests ordered today include: None No orders of the defined types were placed in this encounter.    Disposition:   FU with me as needed.     Signed, Minus Breeding, MD  01/01/2019 5:41 PM    [place]

## 2019-01-21 NOTE — Progress Notes (Signed)
Virtual Visit via Video Note  I connected with@ on 01/22/19 at 10:30 AM EDT by a video enabled telemedicine application and verified that I am speaking with the correct person using two identifiers. Location patient: home Location provider:work  office Persons participating in the virtual visit: patient, provider  [place] national recommendations  regarding COVID 19 pandemic   video visit is advised over in office visit for this patient.  Patient aware  of the limitations of evaluation and management by telemedicine and  availability of in person appointments. and agreed to proceed.   HPI: [name] [name] presents for video visit Fu sob  On goin and not felt to be anginal equivalent buy cardiology  Clinical evaluation   misunderstood and went off the famotidine but back on since may and seems to help some sx   Throat.  upper chest   No se noted  Will need 90 day if refilled  Taking bid   BP ok range 136/66 recently   cbg random when felt shaky and was 119  Meds  Thy roid seems ok  2012  pulm ft min non reversible obstruction  Had negative   covid test recently  No cough new  or fever .  ROS: See pertinent positives and negatives per HPI.  Past Medical History:  Diagnosis Date  . Adenomatous colon polyp   . Anxiety   . Cataract 2013   bilat removed   . DJD (degenerative joint disease) of lumbar spine   . Estrogen deficiency   . Gastritis   . GERD (gastroesophageal reflux disease)   . Headache(784.0)   . Helicobacter pylori infection 10/13/2007   Qualifier: Diagnosis of  By: Marland Mcalpine    . History of positive PPD 10/16/2011  . HSV-2 infection   . Hyperglycemia   . Hyperlipidemia    no meds now  . Hypertension   . IBS (irritable bowel syndrome)   . Medication side effect - Propofol (burning vein) and prvastatin (myalgia) 04/08/2013   Pravastatin patient went off and rechallenged and symptoms recurred muscle cramps /foot cramps   . Neuromuscular disorder (HCC)    muscle  cramps  . Pulsatile tinnitus    had MRA and MPV nl mild carotid dopplers2010  . Shingles 02/09/2011   Right upper face and eyelid but not involving the eye at this point.   . Sleep apnea    does not use c pap  . Thyroid disease   . Vulvitis     Past Surgical History:  Procedure Laterality Date  . ABDOMINAL HYSTERECTOMY  1982   fibroids  . COLONOSCOPY  2003- 2102   adenomatous polyps  . FINGER SURGERY     left middle A1pulley release  . isthmuectomy     thyroid surgery  . LUMBAR SPINE SURGERY  05/1989   spine d/t tumor neurofibroma Quentin Cornwall  . POLYPECTOMY    . SHOULDER ARTHROSCOPY Bilateral    left, right 2017  . THYROIDECTOMY     partial rt  ballen  . TONSILLECTOMY     at age 30.   Marland Kitchen TOTAL HIP ARTHROPLASTY Right 10/02/2017   Procedure: RIGHT TOTAL HIP ARTHROPLASTY ANTERIOR APPROACH;  Surgeon: Rod Can, MD;  Location: [facility];  Service: Orthopedics;  Laterality: Right;  Needs RNFA  . TUBAL LIGATION  1974    Family History  Problem Relation Age of Onset  . Alzheimer's disease Mother   . Heart attack Father        70  .  Asthma Sister   . Colon cancer Sister   . Heart attack Brother        4  . Colon polyps Sister   . Colon cancer Sister   . Colon cancer Paternal Aunt   . Thyroid disease Sister        multinodular goiter  . Stomach cancer Neg Hx   . Breast cancer Neg Hx   . Rectal cancer Neg Hx     Social History   Tobacco Use  . Smoking status: Former Smoker    Packs/day: 0.50    Years: 23.00    Pack years: 11.50    Types: Cigarettes    Quit date: 06/16/1984    Years since quitting: 34.6  . Smokeless tobacco: Never Used  Substance Use Topics  . Alcohol use: No  . Drug use: No      Current Outpatient Medications:  .  acetaminophen (TYLENOL) 500 MG tablet, Take 500 mg by mouth every 6 (six) hours as needed for mild pain, moderate pain or headache., Disp: , Rfl:  .  famotidine (PEPCID) 20 MG tablet, Take 1 tablet (20 mg total) by mouth 2 (two)  times daily., Disp: 180 tablet, Rfl: 1 .  fluticasone (FLONASE) 50 MCG/ACT nasal spray, INHALE 2 SPRAYS IN EACH NOSTRIL EVERY DAY FOR RHINITIS (Patient taking differently: Place 2 sprays into both nostrils as needed for allergies. FOR RHINITIS), Disp: 16 g, Rfl: 5 .  ibuprofen (ADVIL) 200 MG tablet, Take 200 mg by mouth every 6 (six) hours as needed for mild pain or moderate pain., Disp: , Rfl:  .  SYNTHROID 100 MCG tablet, TAKE 1 TABLET BY MOUTH EVERY DAY (Patient taking differently: Take 100 mcg by mouth daily before breakfast. ), Disp: 90 tablet, Rfl: 1 .  triamterene-hydrochlorothiazide (MAXZIDE-25) 37.5-25 MG tablet, TAKE 1 TABLET BY MOUTH EVERY DAY (Patient taking differently: Take 1 tablet by mouth daily. ), Disp: 90 tablet, Rfl: 1  EXAM: BP Readings from Last 3 Encounters:  01/01/19 126/79  12/07/18 121/62  11/24/18 (!) 141/66    VITALS per patient if applicable:  GENERAL: alert, oriented, appears well and in no acute distress nl sppech and affect   HEENT: atraumatic, conjunttiva clear, no obvious abnormalities on inspection of external nose and ears  NECK: normal movements of the head and neck  LUNGS: on inspection no signs of respiratory distress, breathing rate appears normal, no obvious gross SOB, gasping or wheezing  CV: no obvious cyanosis PSYCH/NEURO: pleasant and cooperative, no obvious depression or anxiety, speech and thought processing grossly intact Lab Results  Component Value Date   WBC 5.6 11/24/2018   HGB 11.8 (L) 11/24/2018   HCT 39.8 11/24/2018   PLT 199 11/24/2018   GLUCOSE 95 11/24/2018   CHOL 208 (H) 04/14/2018   TRIG 80.0 04/14/2018   HDL 67.70 04/14/2018   LDLDIRECT 131.1 08/09/2009   LDLCALC 124 (H) 04/14/2018   ALT 13 11/24/2018   AST 19 11/24/2018   NA 141 11/24/2018   K 3.9 11/24/2018   CL 107 11/24/2018   CREATININE 0.89 11/24/2018   BUN 14 11/24/2018   CO2 20 (L) 11/24/2018   TSH 0.509 11/24/2018   INR 0.9 04/14/2007   HGBA1C 6.0  04/14/2018  record review   ASSESSMENT AND PLAN:  Discussed the following assessment and plan: 1. Shortness of breath spells  lasting les than 15 minutes  Seems better and may be from the ged type syndrome   2. Heart burn 3. Chest tightness  4. Medication management   Counseled.  Hydration and med  Advised    Take fa,motidone bid until we see her  Next 2-3 months  And then decide otherwise  Reassured appears safe and may sx may be from reflux throat clearing etc .   Cardiology assuring   eval    bp controlled  ocass lightheaded dizzy less problematic and disc hydration and slow position changes etc .  She appears  More relaxed today visit    Expectant management and discussion of plan and treatment with opportunity to ask questions and all were answered. The patient agreed with the plan and demonstrated an understanding of the instructions.   Advised to call back or seek an in-person evaluation if worsening  or having  further concerns . In the interim    Shanon Ace, MD

## 2019-01-22 ENCOUNTER — Ambulatory Visit (INDEPENDENT_AMBULATORY_CARE_PROVIDER_SITE_OTHER): Payer: Medicare Other | Admitting: Internal Medicine

## 2019-01-22 ENCOUNTER — Other Ambulatory Visit: Payer: Self-pay

## 2019-01-22 ENCOUNTER — Encounter: Payer: Self-pay | Admitting: Internal Medicine

## 2019-01-22 DIAGNOSIS — Z79899 Other long term (current) drug therapy: Secondary | ICD-10-CM | POA: Diagnosis not present

## 2019-01-22 DIAGNOSIS — R0789 Other chest pain: Secondary | ICD-10-CM | POA: Diagnosis not present

## 2019-01-22 DIAGNOSIS — R0602 Shortness of breath: Secondary | ICD-10-CM

## 2019-01-22 DIAGNOSIS — R12 Heartburn: Secondary | ICD-10-CM | POA: Diagnosis not present

## 2019-01-22 MED ORDER — FAMOTIDINE 20 MG PO TABS: 20.0000 mg | ORAL_TABLET | Freq: Two times a day (BID) | ORAL | 1 refills | Status: DC

## 2019-01-23 IMAGING — MG DIGITAL SCREENING BILATERAL MAMMOGRAM WITH TOMO AND CAD
8 series · 8 of 24 positions shown · non-contrast
Comparison: Previous exam(s).

CLINICAL DATA: Screening.

EXAM:
DIGITAL SCREENING BILATERAL MAMMOGRAM WITH TOMO AND CAD

[R MLO synth-2D]
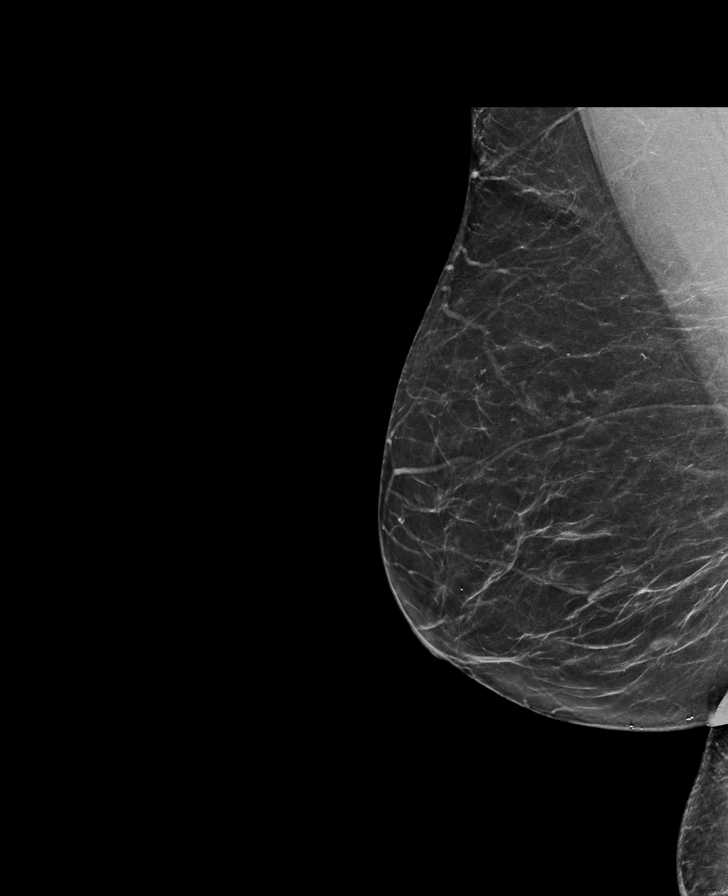

[R CC synth-2D]
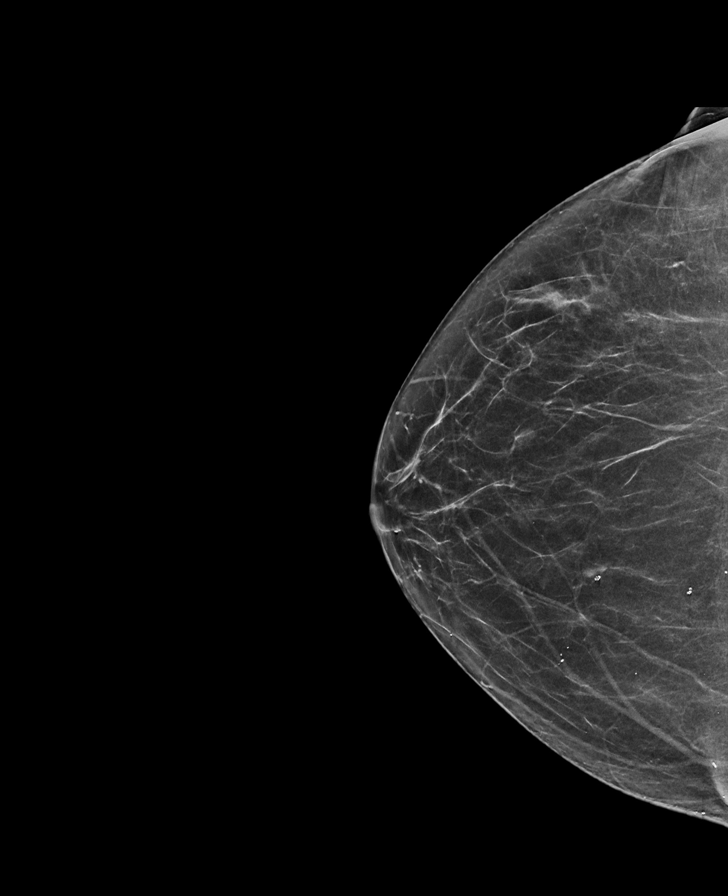

[L CC synth-2D]
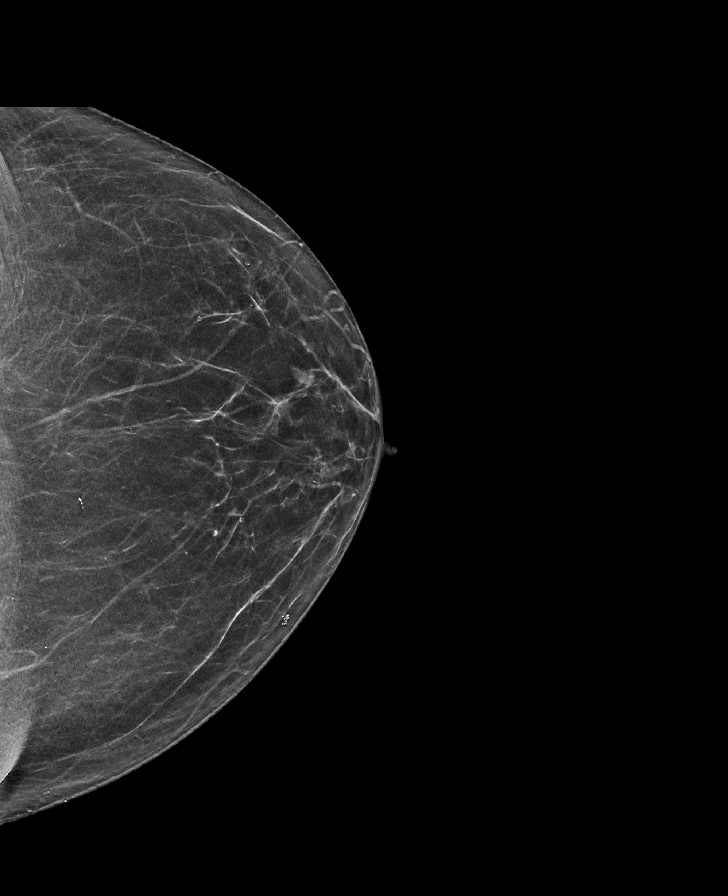

[L MLO synth-2D]
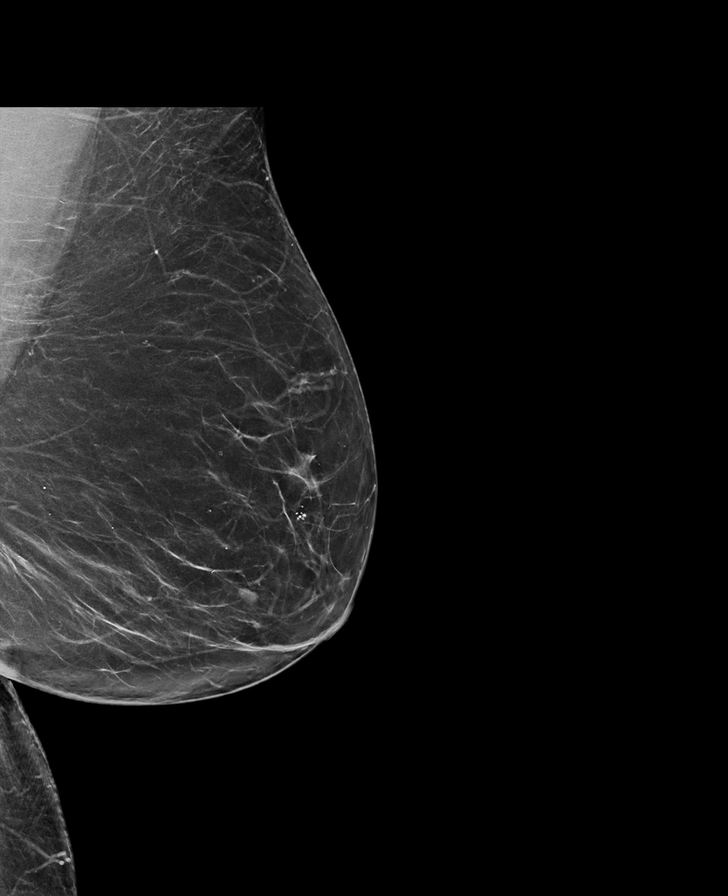

[R MLO tomo · tomo slice 35/68.0]
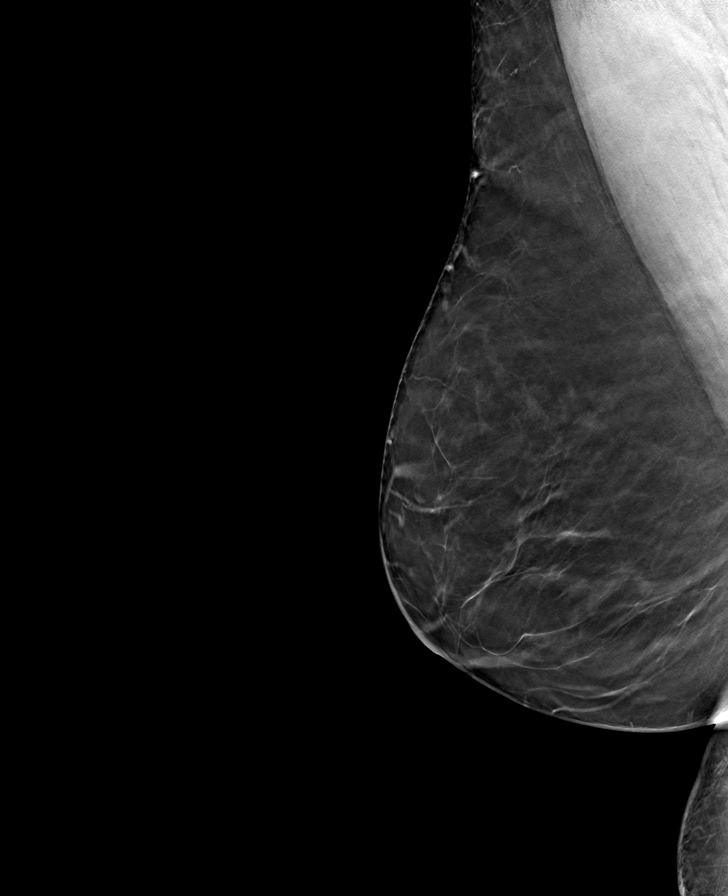

[L MLO tomo · tomo slice 39/77.0]
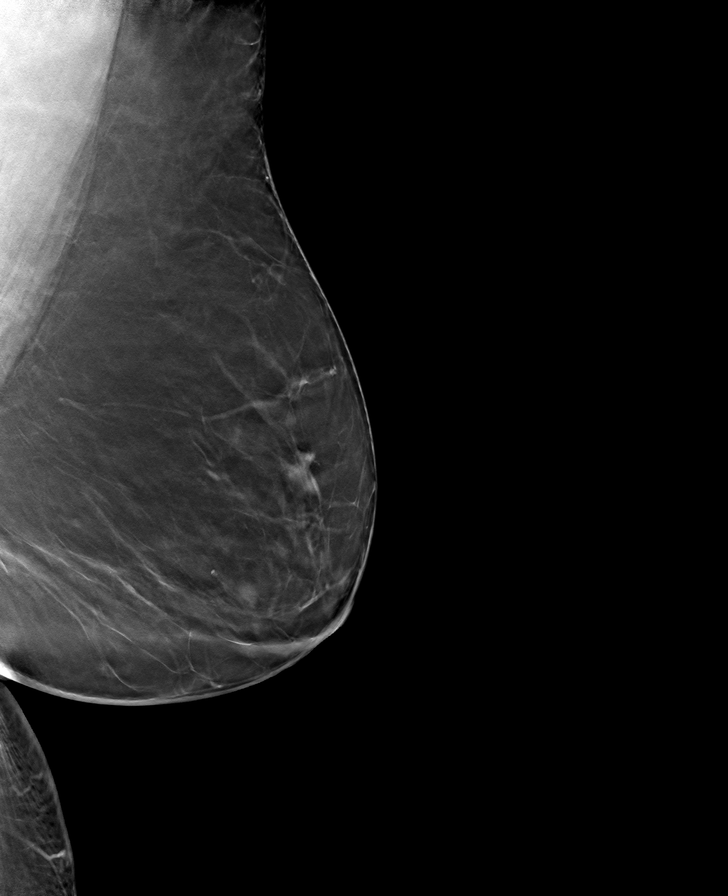

[L CC tomo · tomo slice 32/63.0]
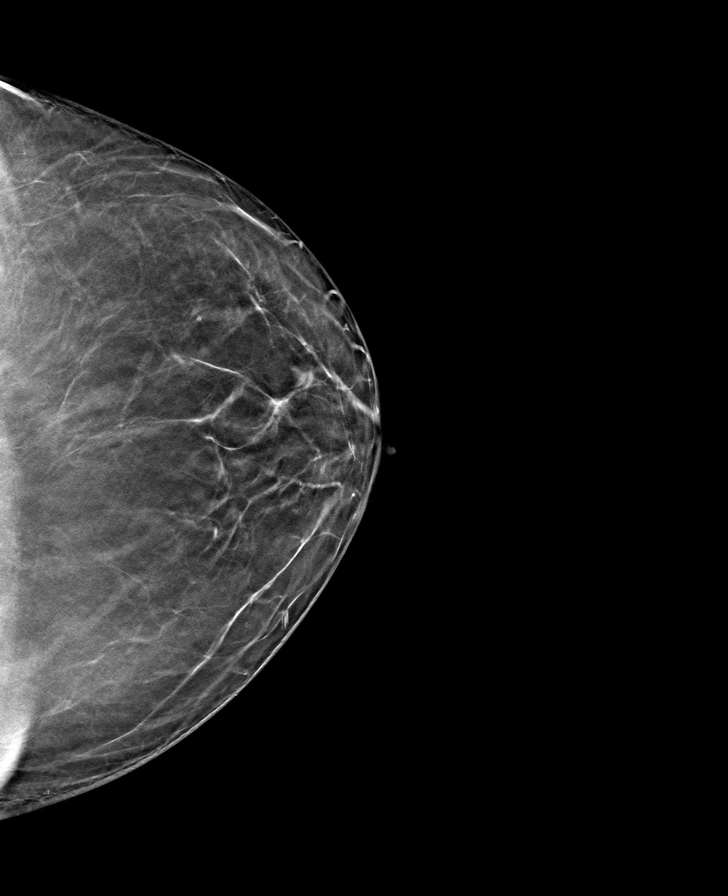

[R CC tomo · tomo slice 31/61.0]
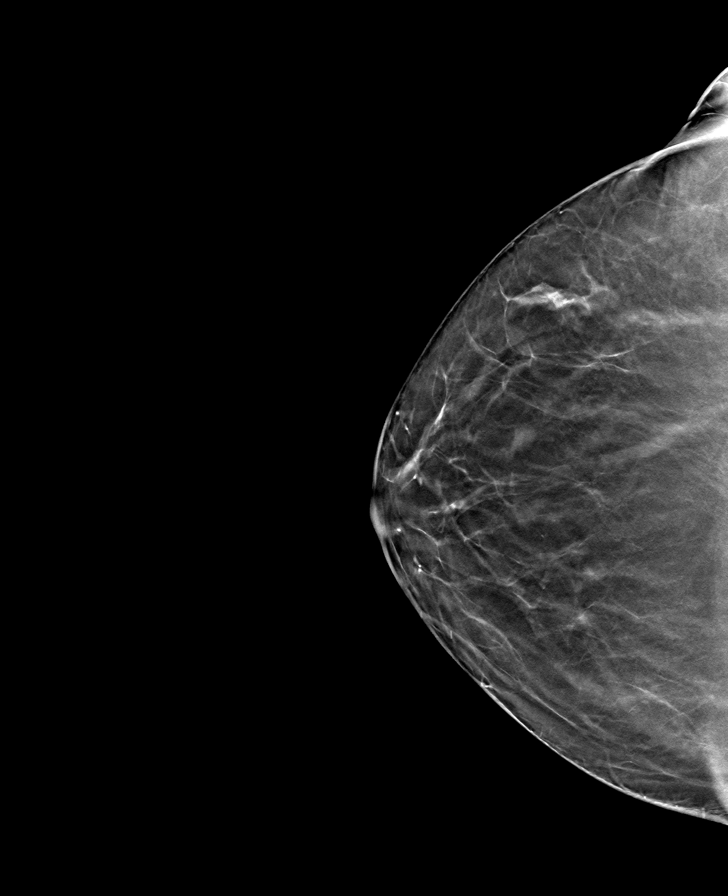

[8 of 24 positions shown; findings below may reference images not displayed]

ACR Breast Density Category b: There are scattered areas of
fibroglandular density.
FINDINGS: There are no findings suspicious for malignancy. Images were
processed with CAD.
IMPRESSION: No mammographic evidence of malignancy. A result letter of this
screening mammogram will be mailed directly to the patient.

RECOMMENDATION:
Screening mammogram in one year. (Code:[id number])

BI-RADS CATEGORY  1: Negative.

## 2019-02-08 ENCOUNTER — Ambulatory Visit: Payer: Medicare Other | Admitting: Cardiology

## 2019-02-24 ENCOUNTER — Ambulatory Visit (INDEPENDENT_AMBULATORY_CARE_PROVIDER_SITE_OTHER): Payer: Medicare Other | Admitting: Internal Medicine

## 2019-02-24 ENCOUNTER — Encounter: Payer: Self-pay | Admitting: Internal Medicine

## 2019-02-24 DIAGNOSIS — R6889 Other general symptoms and signs: Secondary | ICD-10-CM | POA: Diagnosis not present

## 2019-02-24 DIAGNOSIS — I10 Essential (primary) hypertension: Secondary | ICD-10-CM | POA: Diagnosis not present

## 2019-02-24 DIAGNOSIS — R0789 Other chest pain: Secondary | ICD-10-CM

## 2019-02-24 DIAGNOSIS — Z79899 Other long term (current) drug therapy: Secondary | ICD-10-CM

## 2019-02-24 DIAGNOSIS — Z0289 Encounter for other administrative examinations: Secondary | ICD-10-CM

## 2019-02-24 NOTE — Progress Notes (Signed)
Virtual Visit via Video Note  I connected with@ on 02/24/19 at  4:00 PM EDT by a video enabled telemedicine application and verified that I am speaking with the correct person using two identifiers. Location patient: home Location provider:work office Persons participating in the virtual visit: patient, provider  [place] national recommendations  regarding COVID 19 pandemic   video visit is advised over in office visit for this patient.  Patient aware  of the limitations of evaluation and management by telemedicine and  availability of in person appointments. and agreed to proceed.   HPI: [name] [name] presents for video visit  "fu " on  Acid resflux med for chest sx see last notes  This am had feeling left side after eating  And got scared . hving left chest pain off and on  Dr Percival Spanish felt not vascular  And acvil 2 bid has helped . Poss pulled muscle .    Uncertain if dizziness from pepcid .  Or other.    Dry mouth in am has"sleep papnea"  Notes  Am and some sx with warm drinks in the am   So changes this  ROS: See pertinent positives and negatives per HPI. No syncope fever  ocass cough  Took tylenol cold and sinus once today ?  Past Medical History:  Diagnosis Date  . Adenomatous colon polyp   . Anxiety   . Cataract 2013   bilat removed   . DJD (degenerative joint disease) of lumbar spine   . Estrogen deficiency   . Gastritis   . GERD (gastroesophageal reflux disease)   . Headache(784.0)   . Helicobacter pylori infection 10/13/2007   Qualifier: Diagnosis of  By: Marland Mcalpine    . History of positive PPD 10/16/2011  . HSV-2 infection   . Hyperglycemia   . Hyperlipidemia    no meds now  . Hypertension   . IBS (irritable bowel syndrome)   . Medication side effect - Propofol (burning vein) and prvastatin (myalgia) 04/08/2013   Pravastatin patient went off and rechallenged and symptoms recurred muscle cramps /foot cramps   . Neuromuscular disorder (HCC)    muscle cramps   . Pulsatile tinnitus    had MRA and MPV nl mild carotid dopplers2010  . Shingles 02/09/2011   Right upper face and eyelid but not involving the eye at this point.   . Sleep apnea    does not use c pap  . Thyroid disease   . Vulvitis     Past Surgical History:  Procedure Laterality Date  . ABDOMINAL HYSTERECTOMY  1982   fibroids  . COLONOSCOPY  2003- 2102   adenomatous polyps  . FINGER SURGERY     left middle A1pulley release  . isthmuectomy     thyroid surgery  . LUMBAR SPINE SURGERY  05/1989   spine d/t tumor neurofibroma Quentin Cornwall  . POLYPECTOMY    . SHOULDER ARTHROSCOPY Bilateral    left, right 2017  . THYROIDECTOMY     partial rt  ballen  . TONSILLECTOMY     at age 76.   Marland Kitchen TOTAL HIP ARTHROPLASTY Right 10/02/2017   Procedure: RIGHT TOTAL HIP ARTHROPLASTY ANTERIOR APPROACH;  Surgeon: Rod Can, MD;  Location: [facility];  Service: Orthopedics;  Laterality: Right;  Needs RNFA  . TUBAL LIGATION  1974    Family History  Problem Relation Age of Onset  . Alzheimer's disease Mother   . Heart attack Father        2  .  Asthma Sister   . Colon cancer Sister   . Heart attack Brother        26  . Colon polyps Sister   . Colon cancer Sister   . Colon cancer Paternal Aunt   . Thyroid disease Sister        multinodular goiter  . Stomach cancer Neg Hx   . Breast cancer Neg Hx   . Rectal cancer Neg Hx     Social History   Tobacco Use  . Smoking status: Former Smoker    Packs/day: 0.50    Years: 23.00    Pack years: 11.50    Types: Cigarettes    Quit date: 06/16/1984    Years since quitting: 34.7  . Smokeless tobacco: Never Used  Substance Use Topics  . Alcohol use: No  . Drug use: No      Current Outpatient Medications:  .  acetaminophen (TYLENOL) 500 MG tablet, Take 500 mg by mouth every 6 (six) hours as needed for mild pain, moderate pain or headache., Disp: , Rfl:  .  famotidine (PEPCID) 20 MG tablet, Take 1 tablet (20 mg total) by mouth 2 (two) times  daily., Disp: 180 tablet, Rfl: 1 .  fluticasone (FLONASE) 50 MCG/ACT nasal spray, INHALE 2 SPRAYS IN EACH NOSTRIL EVERY DAY FOR RHINITIS (Patient taking differently: Place 2 sprays into both nostrils as needed for allergies. FOR RHINITIS), Disp: 16 g, Rfl: 5 .  ibuprofen (ADVIL) 200 MG tablet, Take 200 mg by mouth every 6 (six) hours as needed for mild pain or moderate pain., Disp: , Rfl:  .  SYNTHROID 100 MCG tablet, TAKE 1 TABLET BY MOUTH EVERY DAY (Patient taking differently: Take 100 mcg by mouth daily before breakfast. ), Disp: 90 tablet, Rfl: 1 .  triamterene-hydrochlorothiazide (MAXZIDE-25) 37.5-25 MG tablet, TAKE 1 TABLET BY MOUTH EVERY DAY (Patient taking differently: Take 1 tablet by mouth daily. ), Disp: 90 tablet, Rfl: 1  EXAM: BP Readings from Last 3 Encounters:  01/01/19 126/79  12/07/18 121/62  11/24/18 (!) 141/66   bp reasdings reported 126 and pulse 60?  VITALS per patient if applicable:  GENERAL: alert, oriented, appears well and in no acute distress HEENT: atraumatic, conjunttiva clear, no obvious abnormalities on inspection of external nose and ears NECK: normal movements of the head and neck LUNGS: on inspection no signs of respiratory distress, breathing rate appears normal, no obvious gross SOB, gasping or wheezing CV: no obvious cyanosi  PSYCH/NEURO: pleasant and cooperative, no obvious depression talkative   Some anxiety  But  Nl  speech and thought processing grossly intact Lab Results  Component Value Date   WBC 5.6 11/24/2018   HGB 11.8 (L) 11/24/2018   HCT 39.8 11/24/2018   PLT 199 11/24/2018   GLUCOSE 95 11/24/2018   CHOL 208 (H) 04/14/2018   TRIG 80.0 04/14/2018   HDL 67.70 04/14/2018   LDLDIRECT 131.1 08/09/2009   LDLCALC 124 (H) 04/14/2018   ALT 13 11/24/2018   AST 19 11/24/2018   NA 141 11/24/2018   K 3.9 11/24/2018   CL 107 11/24/2018   CREATININE 0.89 11/24/2018   BUN 14 11/24/2018   CO2 20 (L) 11/24/2018   TSH 0.509 11/24/2018   INR 0.9  04/14/2007   HGBA1C 6.0 04/14/2018   reviewed c xray  from April   ASSESSMENT AND PLAN:  Discussed the following assessment and plan:    ICD-10-CM   1. Atypical chest pain  R07.89   2. Sensation of feeling  hot  R68.89   3. Medication management  Z79.899   4. Essential hypertension  I10    controlled     has had sx off and on since march  And has had ed visit and cards visit    Had resp  Inf at the onset.  recurrent various  Sx   this day after cheeses and crackers of ? flushing and has had left sided chest pressure and burnding htat had been off and on for a while but had subsided  And Dr.  Percival Spanish though it may have been cw pain    comfor of dizziness   cwtype pain and  Flushing with hot drinks all at different times  No specific  Dx   Can try tylenol 500 bid and caution with ibuprofen to not use too muyhc and if has cwp constocohndritis this can come and go .    This am scared her but fine now and her sx tend to occure in am  Do not take cold meds  Could give se  She wondered if pepcifd caould cause dizziness but not and no gerd sx otherwise  Counseled.  I dont see other dx  As possible   Chest imagining and or esophageal eval but  At this time  Would not proceed with any other   Testing at this time   Expectant management and discussion of plan and treatment with opportunity to ask questions and all were answered. The patient agreed with the plan and demonstrated an understanding of the instructions.   Advised to call back or seek an in-person evaluation if worsening  or having  further concerns .  I provided 29 minutes of non-face-to-face time during this encounter. Over 50 % counseling  And evaluation    Shanon Ace, MD

## 2019-02-26 ENCOUNTER — Ambulatory Visit: Payer: Medicare Other | Admitting: Internal Medicine

## 2019-03-03 DIAGNOSIS — M48062 Spinal stenosis, lumbar region with neurogenic claudication: Secondary | ICD-10-CM | POA: Diagnosis not present

## 2019-03-03 DIAGNOSIS — I10 Essential (primary) hypertension: Secondary | ICD-10-CM | POA: Diagnosis not present

## 2019-03-03 DIAGNOSIS — Z6832 Body mass index (BMI) 32.0-32.9, adult: Secondary | ICD-10-CM | POA: Diagnosis not present

## 2019-03-03 DIAGNOSIS — M1610 Unilateral primary osteoarthritis, unspecified hip: Secondary | ICD-10-CM | POA: Diagnosis not present

## 2019-03-03 DIAGNOSIS — M5416 Radiculopathy, lumbar region: Secondary | ICD-10-CM | POA: Diagnosis not present

## 2019-03-03 DIAGNOSIS — M545 Low back pain: Secondary | ICD-10-CM | POA: Diagnosis not present

## 2019-03-03 DIAGNOSIS — G5712 Meralgia paresthetica, left lower limb: Secondary | ICD-10-CM | POA: Diagnosis not present

## 2019-04-02 ENCOUNTER — Other Ambulatory Visit: Payer: Self-pay

## 2019-04-07 DIAGNOSIS — Z01419 Encounter for gynecological examination (general) (routine) without abnormal findings: Secondary | ICD-10-CM | POA: Diagnosis not present

## 2019-04-07 DIAGNOSIS — N898 Other specified noninflammatory disorders of vagina: Secondary | ICD-10-CM | POA: Diagnosis not present

## 2019-04-07 DIAGNOSIS — N8189 Other female genital prolapse: Secondary | ICD-10-CM | POA: Diagnosis not present

## 2019-04-07 DIAGNOSIS — Z6833 Body mass index (BMI) 33.0-33.9, adult: Secondary | ICD-10-CM | POA: Diagnosis not present

## 2019-04-14 ENCOUNTER — Other Ambulatory Visit: Payer: Self-pay

## 2019-04-14 DIAGNOSIS — Z20822 Contact with and (suspected) exposure to covid-19: Secondary | ICD-10-CM

## 2019-04-15 LAB — NOVEL CORONAVIRUS, NAA: SARS-CoV-2, NAA: NOT DETECTED

## 2019-04-15 NOTE — Progress Notes (Signed)
Chief Complaint  Patient presents with  . Annual Exam    pt has no concerns     HPI: [name] [name] 78 y.o. 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   Ages 60 and 5 .   Still ocass chest sx  Pulled muscle  As disc in past .  No new pulm sx snores  Some  Drainage in am  No vomiting weight loss change in bowel habits take yogurt with probiotic .   bp has been in  120 - 130 range at home   Hx osa not needeing cpap  Consider  Rechecking  Prev seen Dr Gwenette Greet    hh  o f 1  No ets   No recent.  exercise   ROS: See pertinent positives and negatives per HPI. See above    Past Medical History:  Diagnosis Date  . Adenomatous colon polyp   . Anxiety   . Cataract 2013   bilat removed   . DJD (degenerative joint disease) of lumbar spine   . Estrogen deficiency   . Gastritis   . GERD (gastroesophageal reflux disease)   . Headache(784.0)   . Helicobacter pylori infection 10/13/2007   Qualifier: Diagnosis of  By: Marland Mcalpine    . History of positive PPD 10/16/2011  . HSV-2 infection   . Hyperglycemia   . Hyperlipidemia    no meds now  . Hypertension   . IBS (irritable bowel syndrome)   . Medication side effect - Propofol (burning vein) and prvastatin (myalgia) 04/08/2013   Pravastatin patient went off and rechallenged and symptoms recurred muscle cramps /foot cramps   . Neuromuscular disorder (HCC)    muscle cramps  . Pulsatile tinnitus    had MRA and MPV nl mild carotid dopplers2010  . Shingles 02/09/2011   Right upper face and eyelid but not involving the eye at this point.   . Sleep apnea    does not use c pap  . Thyroid disease   . Vulvitis     Family History  Problem Relation Age of Onset  . Alzheimer's disease Mother   . Heart attack Father        68  . Asthma Sister   . Colon cancer Sister   .  Heart attack Brother        2  . Colon polyps Sister   . Colon cancer Sister   . Colon cancer Paternal Aunt   . Thyroid disease Sister        multinodular goiter  . Stomach cancer Neg Hx   . Breast cancer Neg Hx   . Rectal cancer Neg Hx     Social History   Socioeconomic History  . Marital status: Single    Spouse name: widowed.   . Number of children: Y  . Years of education: Not on file  . Highest education level: Not on file  Occupational History  . Occupation: retired.    Comment: [employer]----now retired.    Employer: RETIRED  Social Needs  . Financial resource strain: Not on file  . Food insecurity    Worry: Not on file    Inability: Not on file  . Transportation needs    Medical: Not on file    Non-medical: Not on  file  Tobacco Use  . Smoking status: Former Smoker    Packs/day: 0.50    Years: 23.00    Pack years: 11.50    Types: Cigarettes    Quit date: 06/16/1984    Years since quitting: 34.8  . Smokeless tobacco: Never Used  Substance and Sexual Activity  . Alcohol use: No  . Drug use: No  . Sexual activity: Not Currently    Partners: Male    Birth control/protection: Surgical  Lifestyle  . Physical activity    Days per week: Not on file    Minutes per session: Not on file  . Stress: Not on file  Relationships  . Social Herbalist on phone: Not on file    Gets together: Not on file    Attends religious service: Not on file    Active member of club or organization: Not on file    Attends meetings of clubs or organizations: Not on file    Relationship status: Not on file  Other Topics Concern  . Not on file  Social History Narrative   Retired    former smoker quit 1985   Widowed fall 2010 from myeloma. Lives alone.     Outpatient Medications Prior to Visit  Medication Sig Dispense Refill  . acetaminophen (TYLENOL) 500 MG tablet Take 500 mg by mouth every 6 (six) hours as needed for mild pain, moderate pain or headache.    . famotidine  (PEPCID) 20 MG tablet Take 1 tablet (20 mg total) by mouth 2 (two) times daily. 180 tablet 1  . fluticasone (FLONASE) 50 MCG/ACT nasal spray INHALE 2 SPRAYS IN EACH NOSTRIL EVERY DAY FOR RHINITIS (Patient taking differently: Place 2 sprays into both nostrils as needed for allergies. FOR RHINITIS) 16 g 5  . ibuprofen (ADVIL) 200 MG tablet Take 200 mg by mouth every 6 (six) hours as needed for mild pain or moderate pain.    Marland Kitchen SYNTHROID 100 MCG tablet TAKE 1 TABLET BY MOUTH EVERY DAY (Patient taking differently: Take 100 mcg by mouth daily before breakfast. ) 90 tablet 1  . triamterene-hydrochlorothiazide (MAXZIDE-25) 37.5-25 MG tablet TAKE 1 TABLET BY MOUTH EVERY DAY (Patient taking differently: Take 1 tablet by mouth daily. ) 90 tablet 1   No facility-administered medications prior to visit.      EXAM:  BP (!) 150/72 (BP Location: Right Arm, Patient Position: Sitting, Cuff Size: Normal)   Pulse 62   Temp 97.9 F (36.6 C) (Temporal)   Ht 5\' 5"  (1.651 m)   Wt 200 lb 12.8 oz (91.1 kg)   SpO2 98%   BMI 33.41 kg/m   Body mass index is 33.41 kg/m.  GENERAL: vitals reviewed and listed above, alert, oriented, appears well hydrated and in no acute distress HEENT: atraumatic, conjunctiva  clear, no obvious abnormalities on inspection of external nose and ears tms clear  eaoms nl NECK: no obvious masses on inspection palpation  LUNGS: clear to auscultation bilaterally, no wheezes, rales or rhonchi, good air movement CV: HRRR, no clubbing cyanosis or  peripheral edema nl cap refill  Abdomen:  Sof,t normal bowel sounds without hepatosplenomegaly, no guarding rebound or masses no CVA tenderness Breast: normal by inspection . No dimpling, discharge, masses, tenderness or discharge . MS: moves all extremities without noticeable focal  abnormality PSYCH: pleasant and cooperative, no obvious depression or anxiety Lab Results  Component Value Date   WBC 5.6 11/24/2018   HGB 11.8 (L) 11/24/2018   HCT  39.8 11/24/2018   PLT 199 11/24/2018   GLUCOSE 95 11/24/2018   CHOL 208 (H) 04/14/2018   TRIG 80.0 04/14/2018   HDL 67.70 04/14/2018   LDLDIRECT 131.1 08/09/2009   LDLCALC 124 (H) 04/14/2018   ALT 13 11/24/2018   AST 19 11/24/2018   NA 141 11/24/2018   K 3.9 11/24/2018   CL 107 11/24/2018   CREATININE 0.89 11/24/2018   BUN 14 11/24/2018   CO2 20 (L) 11/24/2018   TSH 0.509 11/24/2018   INR 0.9 04/14/2007   HGBA1C 6.0 04/14/2018   BP Readings from Last 3 Encounters:  04/20/19 (!) 150/72  01/01/19 126/79  12/07/18 121/62   bp 127 range .    ASSESSMENT AND PLAN:  Discussed the following assessment and plan:  Essential hypertension - Plan: CBC with Differential/Platelet, Lipid panel  Medication management - Plan: CBC with Differential/Platelet, Lipid panel  Atypical chest pain  History of back surgery  Multinodular goiter  Hyperlipidemia, unspecified hyperlipidemia type - Plan: CBC with Differential/Platelet, Lipid panel  Mild anemia - Plan: CBC with Differential/Platelet, Lipid panel  Need for influenza vaccination - Plan: Flu Vaccine QUAD High Dose(Fluad) Flu   Lipids   Anemia  Mild  Flu vaccine   bp has been good at home  In 120 130 range  No changes   Hx of snoring seen dr Gwenette Greet in past  May ask for aa referral if needed wants to wait .  Hx of osa no chocking or wakening but  Has hx of same  -Patient advised to return or notify health care team  if  new concerns arise.  Patient Instructions  Continue lifestyle intervention healthy eating and exercise .  Checking lab today   Done suspect heart problem .    If all ok then  rov in 6 months .  Or as needed.  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   Your exam is good  Sx prob from post nasal drainage and reflux .     Standley Brooking. Ezelle Surprenant M.D.

## 2019-04-20 ENCOUNTER — Ambulatory Visit (INDEPENDENT_AMBULATORY_CARE_PROVIDER_SITE_OTHER): Payer: Medicare Other | Admitting: Internal Medicine

## 2019-04-20 ENCOUNTER — Ambulatory Visit: Payer: Medicare Other

## 2019-04-20 ENCOUNTER — Encounter: Payer: Self-pay | Admitting: Internal Medicine

## 2019-04-20 ENCOUNTER — Other Ambulatory Visit: Payer: Self-pay

## 2019-04-20 VITALS — BP 150/72 | HR 62 | Temp 97.9°F | Ht 65.0 in | Wt 200.8 lb

## 2019-04-20 DIAGNOSIS — I10 Essential (primary) hypertension: Secondary | ICD-10-CM

## 2019-04-20 DIAGNOSIS — Z23 Encounter for immunization: Secondary | ICD-10-CM

## 2019-04-20 DIAGNOSIS — Z79899 Other long term (current) drug therapy: Secondary | ICD-10-CM

## 2019-04-20 DIAGNOSIS — R0789 Other chest pain: Secondary | ICD-10-CM | POA: Diagnosis not present

## 2019-04-20 DIAGNOSIS — E042 Nontoxic multinodular goiter: Secondary | ICD-10-CM

## 2019-04-20 DIAGNOSIS — D649 Anemia, unspecified: Secondary | ICD-10-CM

## 2019-04-20 DIAGNOSIS — Z9889 Other specified postprocedural states: Secondary | ICD-10-CM | POA: Diagnosis not present

## 2019-04-20 DIAGNOSIS — E785 Hyperlipidemia, unspecified: Secondary | ICD-10-CM

## 2019-04-20 LAB — CBC WITH DIFFERENTIAL/PLATELET
Basophils Absolute: 0 10*3/uL (ref 0.0–0.1)
Basophils Relative: 0.8 % (ref 0.0–3.0)
Eosinophils Absolute: 0.1 10*3/uL (ref 0.0–0.7)
Eosinophils Relative: 1.6 % (ref 0.0–5.0)
HCT: 38.7 % (ref 36.0–46.0)
Hemoglobin: 12.9 g/dL (ref 12.0–15.0)
Lymphocytes Relative: 41.7 % (ref 12.0–46.0)
Lymphs Abs: 2.3 10*3/uL (ref 0.7–4.0)
MCHC: 33.2 g/dL (ref 30.0–36.0)
MCV: 90.4 fl (ref 78.0–100.0)
Monocytes Absolute: 0.4 10*3/uL (ref 0.1–1.0)
Monocytes Relative: 7.7 % (ref 3.0–12.0)
Neutro Abs: 2.6 10*3/uL (ref 1.4–7.7)
Neutrophils Relative %: 48.2 % (ref 43.0–77.0)
Platelets: 201 10*3/uL (ref 150.0–400.0)
RBC: 4.28 Mil/uL (ref 3.87–5.11)
RDW: 13.3 % (ref 11.5–15.5)
WBC: 5.4 10*3/uL (ref 4.0–10.5)

## 2019-04-20 LAB — LIPID PANEL
Cholesterol: 225 mg/dL — ABNORMAL HIGH (ref 0–200)
HDL: 53.6 mg/dL (ref >39.00)
LDL Cholesterol: 142 mg/dL — ABNORMAL HIGH (ref 0–99)
NonHDL: 171.24
Total CHOL/HDL Ratio: 4
Triglycerides: 145 mg/dL (ref 0.0–149.0)
VLDL: 29 mg/dL (ref 0.0–40.0)

## 2019-04-20 NOTE — Patient Instructions (Addendum)
Continue lifestyle intervention healthy eating and exercise .  Checking lab today   Done suspect heart problem .    If all ok then  rov in 6 months .  Or as needed.  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   Your exam is good  Sx prob from post nasal drainage and reflux .

## 2019-05-27 ENCOUNTER — Other Ambulatory Visit: Payer: Self-pay

## 2019-05-27 DIAGNOSIS — Z20822 Contact with and (suspected) exposure to covid-19: Secondary | ICD-10-CM

## 2019-05-27 DIAGNOSIS — Z20828 Contact with and (suspected) exposure to other viral communicable diseases: Secondary | ICD-10-CM | POA: Diagnosis not present

## 2019-05-28 LAB — NOVEL CORONAVIRUS, NAA: SARS-CoV-2, NAA: NOT DETECTED

## 2019-05-31 ENCOUNTER — Other Ambulatory Visit: Payer: Self-pay | Admitting: Internal Medicine

## 2019-05-31 DIAGNOSIS — Z1231 Encounter for screening mammogram for malignant neoplasm of breast: Secondary | ICD-10-CM

## 2019-07-03 ENCOUNTER — Ambulatory Visit (HOSPITAL_COMMUNITY)
Admission: EM | Admit: 2019-07-03 | Discharge: 2019-07-03 | Disposition: A | Discharge status: Home / Self Care | Payer: Medicare Other | Attending: Family Medicine | Admitting: Family Medicine

## 2019-07-03 ENCOUNTER — Other Ambulatory Visit: Payer: Self-pay

## 2019-07-03 ENCOUNTER — Encounter (HOSPITAL_COMMUNITY): Payer: Self-pay

## 2019-07-03 ENCOUNTER — Ambulatory Visit (INDEPENDENT_AMBULATORY_CARE_PROVIDER_SITE_OTHER): Payer: Medicare Other

## 2019-07-03 DIAGNOSIS — S6991XA Unspecified injury of right wrist, hand and finger(s), initial encounter: Secondary | ICD-10-CM | POA: Diagnosis not present

## 2019-07-03 DIAGNOSIS — M25531 Pain in right wrist: Secondary | ICD-10-CM | POA: Diagnosis not present

## 2019-07-03 DIAGNOSIS — M79641 Pain in right hand: Secondary | ICD-10-CM | POA: Diagnosis not present

## 2019-07-03 NOTE — ED Triage Notes (Signed)
Pt present right hand injury, pt states she was playing with her grand daughter when she tripped and fall on her right hand.

## 2019-07-03 NOTE — ED Provider Notes (Signed)
[place]    CSN: [id number] Arrival date & time: 07/03/19  1052      History   Chief Complaint Chief Complaint  Patient presents with  . Hand Injury    HPI [name] [name] is a 78 y.o. female.   She is presenting with right wrist and hand pain.  She fell yesterday while playing with her granddaughter.  She is experiencing swelling over the dorsum of the distal radius as well as the hand.  Significant pain last night that she had trouble sleeping with.  She has applied a compression brace which seems to help.  Denies any numbness or tingling.  Pain is constant and moderate.  HPI  Past Medical History:  Diagnosis Date  . Adenomatous colon polyp   . Anxiety   . Cataract 2013   bilat removed   . DJD (degenerative joint disease) of lumbar spine   . Estrogen deficiency   . Gastritis   . GERD (gastroesophageal reflux disease)   . Headache(784.0)   . Helicobacter pylori infection 10/13/2007   Qualifier: Diagnosis of  By: Marland Mcalpine    . History of positive PPD 10/16/2011  . HSV-2 infection   . Hyperglycemia   . Hyperlipidemia    no meds now  . Hypertension   . IBS (irritable bowel syndrome)   . Medication side effect - Propofol (burning vein) and prvastatin (myalgia) 04/08/2013   Pravastatin patient went off and rechallenged and symptoms recurred muscle cramps /foot cramps   . Neuromuscular disorder (HCC)    muscle cramps  . Pulsatile tinnitus    had MRA and MPV nl mild carotid dopplers2010  . Shingles 02/09/2011   Right upper face and eyelid but not involving the eye at this point.   . Sleep apnea    does not use c pap  . Thyroid disease   . Vulvitis     Patient Active Problem List   Diagnosis Date Noted  . Dizziness 12/07/2018  . Educated about COVID-19 virus infection 12/07/2018  . Dermatofibroma 12/23/2017  . Osteoarthritis of right hip 10/02/2017  . Multinodular goiter 06/15/2015  . Post-surgical hypothyroidism 06/15/2015  . Leg pain,  lateral 08/30/2014  . Hyperglycemia 11/05/2013  . Family history of Alzheimer's disease 11/05/2013  . Memory problem 11/05/2013  . Medication side effect - Propofol (burning vein) and prvastatin (myalgia) 04/08/2013  . Lesion of spleen 04/08/2013  . Arthritis of shoulder 11/03/2012  . History of back surgery 11/03/2012  . HSV-2 seropositive 02/17/2012  . Medicare annual wellness visit, subsequent 10/16/2011  . OSA (obstructive sleep apnea) 10/16/2011  . History of positive PPD 10/16/2011  . History of colonic polyps 07/18/2011  . Dyspnea 10/18/2010  . Heart burn 10/18/2010  . Chest pain 10/18/2010  . ADJUSTMENT DISORDER WITH ANXIETY 07/24/2010  . CHEST WALL PAIN, ANTERIOR 07/24/2010  . KNEE PAIN 04/04/2010  . SLEEP DISORDER/DISTURBANCE 11/21/2009  . LEG PAIN 02/28/2009  . TINNITUS 05/11/2008  . HEADACHE 02/07/2008  . UNSPECIFIED VITAMIN D DEFICIENCY 02/05/2008  . OBESITY, MILD 10/13/2007  . ANEMIA 10/13/2007  . GASTRITIS, CHRONIC 10/13/2007  . DUODENITIS, WITHOUT HEMORRHAGE 10/13/2007  . HIATAL HERNIA 10/13/2007  . IRRITABLE BOWEL SYNDROME 10/13/2007  . GRANULOMA 10/13/2007  . RHINITIS 09/23/2007  . FASTING HYPERGLYCEMIA 09/04/2007  . HYPERLIPIDEMIA 04/07/2007  . HYPERTENSION 04/07/2007  . GERD 04/07/2007    Past Surgical History:  Procedure Laterality Date  . ABDOMINAL HYSTERECTOMY  1982   fibroids  . COLONOSCOPY  2003- 2102  adenomatous polyps  . FINGER SURGERY     left middle A1pulley release  . isthmuectomy     thyroid surgery  . LUMBAR SPINE SURGERY  05/1989   spine d/t tumor neurofibroma Quentin Cornwall  . POLYPECTOMY    . SHOULDER ARTHROSCOPY Bilateral    left, right 2017  . THYROIDECTOMY     partial rt  ballen  . TONSILLECTOMY     at age 74.   Marland Kitchen TOTAL HIP ARTHROPLASTY Right 10/02/2017   Procedure: RIGHT TOTAL HIP ARTHROPLASTY ANTERIOR APPROACH;  Surgeon: Rod Can, MD;  Location: [facility];  Service: Orthopedics;  Laterality: Right;  Needs RNFA  . TUBAL  LIGATION  1974    OB History    Gravida  3   Para  3   Term      Preterm      AB      Living        SAB      TAB      Ectopic      Multiple      Live Births               Home Medications    Prior to Admission medications   Medication Sig Start Date End Date Taking? Authorizing Provider  acetaminophen (TYLENOL) 500 MG tablet Take 500 mg by mouth every 6 (six) hours as needed for mild pain, moderate pain or headache.    [provider]  famotidine (PEPCID) 20 MG tablet Take 1 tablet (20 mg total) by mouth 2 (two) times daily. 01/22/19   Panosh, Standley Brooking, MD  fluticasone (FLONASE) 50 MCG/ACT nasal spray INHALE 2 SPRAYS IN EACH NOSTRIL EVERY DAY FOR RHINITIS Patient taking differently: Place 2 sprays into both nostrils as needed for allergies. FOR RHINITIS 08/20/18   Panosh, Standley Brooking, MD  ibuprofen (ADVIL) 200 MG tablet Take 200 mg by mouth every 6 (six) hours as needed for mild pain or moderate pain.    [provider]  SYNTHROID 100 MCG tablet TAKE 1 TABLET BY MOUTH EVERY DAY Patient taking differently: Take 100 mcg by mouth daily before breakfast.  11/05/18   Panosh, Standley Brooking, MD  triamterene-hydrochlorothiazide ([id number]) 37.5-25 MG tablet TAKE 1 TABLET BY MOUTH EVERY DAY Patient taking differently: Take 1 tablet by mouth daily.  11/02/18   Panosh, Standley Brooking, MD    Family History Family History  Problem Relation Age of Onset  . Alzheimer's disease Mother   . Heart attack Father        73  . Asthma Sister   . Colon cancer Sister   . Heart attack Brother        25  . Colon polyps Sister   . Colon cancer Sister   . Colon cancer Paternal Aunt   . Thyroid disease Sister        multinodular goiter  . Stomach cancer Neg Hx   . Breast cancer Neg Hx   . Rectal cancer Neg Hx     Social History Social History   Tobacco Use  . Smoking status: Former Smoker    Packs/day: 0.50    Years: 23.00    Pack years: 11.50    Types: Cigarettes    Quit  date: 06/16/1984    Years since quitting: 35.0  . Smokeless tobacco: Never Used  Substance Use Topics  . Alcohol use: No  . Drug use: No     Allergies   Lisinopril, Lyrica [pregabalin], Pravastatin, Simvastatin, Codeine phosphate,  Oxycodone hcl, and Strawberry extract   Review of Systems Review of Systems  Constitutional: Negative for fever.  HENT: Negative for congestion.   Respiratory: Negative for cough.   Cardiovascular: Negative for chest pain.  Gastrointestinal: Negative for abdominal pain.  Musculoskeletal: Positive for arthralgias.  Skin: Negative for color change.  Neurological: Negative for weakness.  Hematological: Negative for adenopathy.     Physical Exam Triage Vital Signs ED Triage Vitals  Enc Vitals Group     BP 07/03/19 1216 (!) 113/54     Pulse Rate 07/03/19 1216 60     Resp 07/03/19 1216 16     Temp 07/03/19 1216 98.6 F (37 C)     Temp Source 07/03/19 1216 Oral     SpO2 07/03/19 1216 98 %     Weight --      Height --      Head Circumference --      Peak Flow --      Pain Score 07/03/19 1215 9     Pain Loc --      Pain Edu? --      Excl. in [place]? --    No data found.  Updated Vital Signs BP (!) 113/54 (BP Location: Left Arm)   Pulse 60   Temp 98.6 F (37 C) (Oral)   Resp 16   SpO2 98%   Visual Acuity Right Eye Distance:   Left Eye Distance:   Bilateral Distance:    Right Eye Near:   Left Eye Near:    Bilateral Near:     Physical Exam Gen: NAD, alert, cooperative with exam, well-appearing ENT: normal lips, normal nasal mucosa,  Eye: normal EOM, normal conjunctiva and lids CV:  no edema, +2 pedal pulses   Resp: no accessory muscle use, non-labored,  Skin: no rashes, no areas of induration  Neuro: normal tone, normal sensation to touch Psych:  normal insight, alert and oriented MSK:  Right wrist: Swelling over the distal radius and dorsum of the hand. Tenderness to palpation of the distal radius. Tenderness to palpation over  the second and third metacarpal. Limited flexion extension of the wrist due to pain. Unable to make a complete fist. Neurovascular intact.   UC Treatments / Results  Labs (all labs ordered are listed, but only abnormal results are displayed) Labs Reviewed - No data to display  EKG   Radiology Dg Wrist Complete Right  Result Date: 07/03/2019 CLINICAL DATA:  Right hand and wrist pain after fall EXAM: RIGHT WRIST - COMPLETE 3+ VIEW; RIGHT HAND - COMPLETE 3+ VIEW COMPARISON:  None. FINDINGS: No acute fracture. No dislocation. Carpal bones and carpal intraosseous spaces are intact. There is a 4 mm hyperdense focus within the distal tuft of the long finger with subtle surrounding lucency. No associated soft tissue swelling. Mild joint space narrowing of the interphalangeal joints and first MCP joint. IMPRESSION: 1. No acute osseous abnormality of the right hand or wrist. 2. There is a 4 mm hyperdense focus within the distal tuft of the long finger with subtle surrounding lucency suspicious for chronic foreign body reaction. This does not appear to be an acute finding. Correlate with patient history for prior nailbed injury. Electronically Signed   By: Davina Poke M.D.   On: 07/03/2019 13:16   Dg Hand Complete Right  Result Date: 07/03/2019 CLINICAL DATA:  Right hand and wrist pain after fall EXAM: RIGHT WRIST - COMPLETE 3+ VIEW; RIGHT HAND - COMPLETE 3+ VIEW COMPARISON:  None. FINDINGS:  No acute fracture. No dislocation. Carpal bones and carpal intraosseous spaces are intact. There is a 4 mm hyperdense focus within the distal tuft of the long finger with subtle surrounding lucency. No associated soft tissue swelling. Mild joint space narrowing of the interphalangeal joints and first MCP joint. IMPRESSION: 1. No acute osseous abnormality of the right hand or wrist. 2. There is a 4 mm hyperdense focus within the distal tuft of the long finger with subtle surrounding lucency suspicious for chronic  foreign body reaction. This does not appear to be an acute finding. Correlate with patient history for prior nailbed injury. Electronically Signed   By: Davina Poke M.D.   On: 07/03/2019 13:16    Procedures Procedures (including critical care time)  Medications Ordered in UC Medications - No data to display  Initial Impression / Assessment and Plan / UC Course  I have reviewed the triage vital signs and the nursing notes.  Pertinent labs & imaging results that were available during my care of the patient were reviewed by me and considered in my medical decision making (see chart for details).     Ms. Weisfeld is a 78 year old female is presenting with right wrist and hand pain.  Clinical exam would suggest a fracture but imaging was not revealing.  Placed in a Velcro wrist splint today.  Counseled on supportive care.  Advised to follow-up for repeat x-ray as she most likely has a fracture that was not appreciated on imaging today.  Final Clinical Impressions(s) / UC Diagnoses   Final diagnoses:  Right wrist pain     Discharge Instructions     Please try tylenol and ice  Please try the brace  Please follow up     ED Prescriptions    None     PDMP not reviewed this encounter.   Rosemarie Ax, MD 07/03/19 [phone]

## 2019-07-03 NOTE — Discharge Instructions (Signed)
Please try tylenol and ice  Please try the brace  Please follow up

## 2019-07-20 ENCOUNTER — Other Ambulatory Visit: Payer: Self-pay

## 2019-07-20 ENCOUNTER — Ambulatory Visit
Admission: RE | Admit: 2019-07-20 | Discharge: 2019-07-20 | Disposition: A | Discharge status: Home / Self Care | Payer: Medicare Other | Source: Ambulatory Visit | Attending: Internal Medicine | Admitting: Internal Medicine

## 2019-07-20 DIAGNOSIS — Z1231 Encounter for screening mammogram for malignant neoplasm of breast: Secondary | ICD-10-CM

## 2019-08-02 ENCOUNTER — Other Ambulatory Visit: Payer: Self-pay

## 2019-08-02 ENCOUNTER — Ambulatory Visit: Payer: Self-pay

## 2019-08-02 NOTE — Telephone Encounter (Signed)
Patient is on the schedule for tomorrow at 2:15pm. Sending as [name] [name]

## 2019-08-02 NOTE — Telephone Encounter (Signed)
Noted. Will see her then.

## 2019-08-02 NOTE — Telephone Encounter (Signed)
Patient called stating that she feels lightheaded and has a headache when she eats anything with sugar.  She states that yesterday while baking cookies she stopped and checked her BS because she felt  Lightheaded.  She states it was initially 601  She rechecked and it was 201.  today she states that she has avoided all sweets but in AM she was 66 Now speaking with me she rechecked and her BS is 107. She states that she has never been Dx as diabetic.  She states that she has had some higher BS with labs but no diagnosis. She states that she drinks a lot of water. She is using her husbands Glucose meter. Care advice read to patient.  Call transfered to office for scheduling.  Reason for Disposition . Taking a medicine that could cause dizziness (e.g., blood pressure medications, diuretics) . New onset diabetes suspected (e.g., frequent urination, weakness, weight loss)  Answer Assessment - Initial Assessment Questions 1. DESCRIPTION: "Describe your dizziness."     lightheaded 2. LIGHTHEADED: "Do you feel lightheaded?" (e.g., somewhat faint, woozy, weak upon standing)     No just lightheaded 3. VERTIGO: "Do you feel like either you or the room is spinning or tilting?" (i.e. vertigo)    No 4. SEVERITY: "How bad is it?"  "Do you feel like you are going to faint?" "Can you stand and walk?"   - MILD - walking normally   - MODERATE - interferes with normal activities (e.g., work, school)    - SEVERE - unable to stand, requires support to walk, feels like passing out now.     mild 5. ONSET:  "When did the dizziness begin?"    After eating sweats 6. AGGRAVATING FACTORS: "Does anything make it worse?" (e.g., standing, change in head position)   No just after eating sugar 7. HEART RATE: "Can you tell me your heart rate?" "How many beats in 15 seconds?"  (Note: not all patients can do this)      No  8. CAUSE: "What do you think is causing the dizziness?"     sugar 9. RECURRENT SYMPTOM: "Have you had  dizziness before?" If so, ask: "When was the last time?" "What happened that time?"    Yes when pulled muscle in chest 10. OTHER SYMPTOMS: "Do you have any other symptoms?" (e.g., fever, chest pain, vomiting, diarrhea, bleeding)      Headache pressure 11. PREGNANCY: "Is there any chance you are pregnant?" "When was your last menstrual period?"       N/A  Answer Assessment - Initial Assessment Questions 1. BLOOD GLUCOSE: "What is your blood glucose level?"      511 this AM,  2. ONSET: "When did you check the blood glucose?"    This AM 3. USUAL RANGE: "What is your glucose level usually?" (e.g., usual fasting morning value, usual evening value)    Unsure not diagnosed 4. KETONES: "Do you check for ketones (urine or blood test strips)?" If yes, ask: "What does the test show now?"    No 5. TYPE 1 or 2:  "Do you know what type of diabetes you have?"  (e.g., Type 1, Type 2, Gestational; doesn't know)     Not diagnosed 6. INSULIN: "Do you take insulin?" "What type of insulin(s) do you use? What is the mode of delivery? (syringe, pen; injection or pump)?"     No 7. DIABETES PILLS: "Do you take any pills for your diabetes?" If yes, ask: "Have you missed taking any  pills recently?"    No 8. OTHER SYMPTOMS: "Do you have any symptoms?" (e.g., fever, frequent urination, difficulty breathing, dizziness, weakness, vomiting)    Dizziness HA 9. PREGNANCY: "Is there any chance you are pregnant?" "When was your last menstrual period?"     N/A  Protocols used: DIZZINESS - LIGHTHEADEDNESS-A-AH, DIABETES - HIGH BLOOD SUGAR-A-AH

## 2019-08-03 ENCOUNTER — Ambulatory Visit (INDEPENDENT_AMBULATORY_CARE_PROVIDER_SITE_OTHER): Payer: Medicare Other | Admitting: Family Medicine

## 2019-08-03 ENCOUNTER — Other Ambulatory Visit: Payer: Self-pay

## 2019-08-03 ENCOUNTER — Encounter: Payer: Self-pay | Admitting: Family Medicine

## 2019-08-03 VITALS — BP 128/76 | HR 65 | Temp 97.8°F | Ht 65.0 in | Wt 206.9 lb

## 2019-08-03 DIAGNOSIS — R739 Hyperglycemia, unspecified: Secondary | ICD-10-CM

## 2019-08-03 LAB — GLUCOSE, POCT (MANUAL RESULT ENTRY): POC Glucose: 96 mg/dl (ref 70–99)

## 2019-08-03 LAB — POCT GLYCOSYLATED HEMOGLOBIN (HGB A1C): Hemoglobin A1C: 5.4 % (ref 4.0–5.6)

## 2019-08-03 NOTE — Progress Notes (Signed)
Subjective:     Patient ID: [name] [name], female   DOB: [birth year], 78 y.o.   MRN: [id number]  HPI [name] [name] is here with concern for possible elevated blood sugar.  She states that Sunday she was baking some cookies.  She ate a couple and about 30 minutes later she had some headache (which she has had previously with high glycemic foods).  Her husband's old glucose monitor was used and she got a reading of a 601 but there was a mention on the machine of "error".  She then repeated blood sugar 5 minutes later and got reading of 201.  She has not had any polyuria, polydipsia, or weight loss.  She has never been treated for high blood sugar previously.  No known family history of diabetes  Past Medical History:  Diagnosis Date  . Adenomatous colon polyp   . Anxiety   . Cataract 2013   bilat removed   . DJD (degenerative joint disease) of lumbar spine   . Estrogen deficiency   . Gastritis   . GERD (gastroesophageal reflux disease)   . Headache(784.0)   . Helicobacter pylori infection 10/13/2007   Qualifier: Diagnosis of  By: Marland Mcalpine    . History of positive PPD 10/16/2011  . HSV-2 infection   . Hyperglycemia   . Hyperlipidemia    no meds now  . Hypertension   . IBS (irritable bowel syndrome)   . Medication side effect - Propofol (burning vein) and prvastatin (myalgia) 04/08/2013   Pravastatin patient went off and rechallenged and symptoms recurred muscle cramps /foot cramps   . Neuromuscular disorder (HCC)    muscle cramps  . Pulsatile tinnitus    had MRA and MPV nl mild carotid dopplers2010  . Shingles 02/09/2011   Right upper face and eyelid but not involving the eye at this point.   . Sleep apnea    does not use c pap  . Thyroid disease   . Vulvitis    Past Surgical History:  Procedure Laterality Date  . ABDOMINAL HYSTERECTOMY  1982   fibroids  . COLONOSCOPY  2003- 2102   adenomatous polyps  . FINGER SURGERY     left middle A1pulley release  . isthmuectomy      thyroid surgery  . LUMBAR SPINE SURGERY  05/1989   spine d/t tumor neurofibroma Quentin Cornwall  . POLYPECTOMY    . SHOULDER ARTHROSCOPY Bilateral    left, right 2017  . THYROIDECTOMY     partial rt  ballen  . TONSILLECTOMY     at age 77.   Marland Kitchen TOTAL HIP ARTHROPLASTY Right 10/02/2017   Procedure: RIGHT TOTAL HIP ARTHROPLASTY ANTERIOR APPROACH;  Surgeon: Rod Can, MD;  Location: [facility];  Service: Orthopedics;  Laterality: Right;  Needs RNFA  . TUBAL LIGATION  1974    reports that she quit smoking about 35 years ago. Her smoking use included cigarettes. She has a 11.50 pack-year smoking history. She has never used smokeless tobacco. She reports that she does not drink alcohol or use drugs. family history includes Alzheimer's disease in her mother; Asthma in her sister; Colon cancer in her paternal aunt, sister, and sister; Colon polyps in her sister; Heart attack in her brother and father; Thyroid disease in her sister. Allergies  Allergen Reactions  . Lisinopril     REACTION: COUGH  . Lyrica [Pregabalin] Other (See Comments)    Cns side efffects  . Pravastatin Other (See Comments)    Cramps.  Marland Kitchen  Simvastatin     Myalgia and stiffness  . Codeine Phosphate     REACTION: itching  . Oxycodone Hcl     REACTION: rash  . Strawberry Extract      Review of Systems  Constitutional: Negative for appetite change, chills, fever and unexpected weight change.  Respiratory: Negative for cough.   Cardiovascular: Negative for chest pain.  Endocrine: Negative for polydipsia and polyuria.  Genitourinary: Negative for dysuria and flank pain.       Objective:   Physical Exam Vitals reviewed.  Constitutional:      Appearance: Normal appearance.  Cardiovascular:     Rate and Rhythm: Normal rate and regular rhythm.  Pulmonary:     Effort: Pulmonary effort is normal.     Breath sounds: Normal breath sounds.  Neurological:     Mental Status: She is alert.        Assessment:      -Concern for possible hyperglycemia.  Blood sugar today 2.5 hours postprandial 96 with A1c 5.4%    Plan:     -Reassurance that she has no evidence for diabetes at this time.  Recent home reading above appears to have been an error  -Continue low glycemic diet  Eulas Post MD [facility]

## 2019-08-03 NOTE — Patient Instructions (Signed)
Your blood sugar and A1c are very reassuring today and show no evidence for diabetes  Continue low glycemic diet and follow-up with Dr. Regis Bill if you have any further concerns

## 2019-08-05 ENCOUNTER — Other Ambulatory Visit: Payer: Self-pay | Admitting: Internal Medicine

## 2019-09-03 DIAGNOSIS — M7062 Trochanteric bursitis, left hip: Secondary | ICD-10-CM | POA: Diagnosis not present

## 2019-10-01 DIAGNOSIS — M7062 Trochanteric bursitis, left hip: Secondary | ICD-10-CM | POA: Diagnosis not present

## 2019-10-07 ENCOUNTER — Ambulatory Visit: Payer: Medicare Other | Attending: Internal Medicine

## 2019-10-07 DIAGNOSIS — Z23 Encounter for immunization: Secondary | ICD-10-CM | POA: Insufficient documentation

## 2019-10-07 NOTE — Progress Notes (Signed)
   Covid-19 Vaccination Clinic  Name:  [name] [name]    MRN: [id number] DOB: [birth year]  10/07/2019  [name] was observed post Covid-19 immunization for 15 minutes without incidence. She was provided with Vaccine Information Sheet and instruction to access the V-Safe system.   [name] [name] was instructed to call 911 with any severe reactions post vaccine: Marland Kitchen Difficulty breathing  . Swelling of your face and throat  . A fast heartbeat  . A bad rash all over your body  . Dizziness and weakness    Immunizations Administered    Name Date Dose VIS Date Route   Pfizer COVID-19 Vaccine 10/07/2019 10:07 AM 0.3 mL 07/23/2019 Intramuscular   Manufacturer: [place]   Lot: [id number]   [place]: [id number]

## 2019-10-19 ENCOUNTER — Ambulatory Visit: Payer: Medicare Other | Admitting: Internal Medicine

## 2019-10-22 NOTE — Progress Notes (Signed)
This visit occurred during the SARS-CoV-2 public health emergency.  Safety protocols were in place, including screening questions prior to the visit, additional usage of staff PPE, and extensive cleaning of exam room while observing appropriate contact time as indicated for disinfecting solutions.    Chief Complaint  Patient presents with  . Hypertension    6 motnh f/u    HPI: [name] [name] 79 y.o. come in for Chronic disease management  Doing ok  Under care  recently   Hip bursitis  she was seen dr B in December  For hypergluycemia  But pp  Was 69 and  a1c 5.4  No evidence o By me  For ht etc in September 20 Gyne appt in past year  Concern about gf can get PP jitteriness but readings are good  ( left over from husband  Supplies )  Still gets para sternal sorenss  Laying on  Sides  And has to position pillow  Certain way  Not worse doing better    ROS: See pertinent positives and negatives per HPI. Has had first   covid vaccine  [employer].     No se  sofar  Past Medical History:  Diagnosis Date  . Adenomatous colon polyp   . Anxiety   . Cataract 2013   bilat removed   . DJD (degenerative joint disease) of lumbar spine   . Estrogen deficiency   . Gastritis   . GERD (gastroesophageal reflux disease)   . Headache(784.0)   . Helicobacter pylori infection 10/13/2007   Qualifier: Diagnosis of  By: Marland Mcalpine    . History of positive PPD 10/16/2011  . HSV-2 infection   . Hyperglycemia   . Hyperlipidemia    no meds now  . Hypertension   . IBS (irritable bowel syndrome)   . Medication side effect - Propofol (burning vein) and prvastatin (myalgia) 04/08/2013   Pravastatin patient went off and rechallenged and symptoms recurred muscle cramps /foot cramps   . Neuromuscular disorder (HCC)    muscle cramps  . Pulsatile tinnitus    had MRA and MPV nl mild carotid dopplers2010  . Shingles 02/09/2011   Right upper face and eyelid but not involving the eye at this point.   . Sleep  apnea    does not use c pap  . Thyroid disease   . Vulvitis     Family History  Problem Relation Age of Onset  . Alzheimer's disease Mother   . Heart attack Father        46  . Asthma Sister   . Colon cancer Sister   . Heart attack Brother        77  . Colon polyps Sister   . Colon cancer Sister   . Colon cancer Paternal Aunt   . Thyroid disease Sister        multinodular goiter  . Stomach cancer Neg Hx   . Breast cancer Neg Hx   . Rectal cancer Neg Hx     Social History   Socioeconomic History  . Marital status: Single    Spouse name: widowed.   . Number of children: Y  . Years of education: Not on file  . Highest education level: Not on file  Occupational History  . Occupation: retired.    Comment: [employer]----now retired.    Employer: RETIRED  Tobacco Use  . Smoking status: Former Smoker    Packs/day: 0.50    Years: 23.00    Pack years:  11.50    Types: Cigarettes    Quit date: 06/16/1984    Years since quitting: 35.3  . Smokeless tobacco: Never Used  Substance and Sexual Activity  . Alcohol use: No  . Drug use: No  . Sexual activity: Not Currently    Partners: Male    Birth control/protection: Surgical  Other Topics Concern  . Not on file  Social History Narrative   Retired    former smoker quit 1985   Widowed fall 2010 from myeloma. Lives alone.    Social Determinants of Health   Financial Resource Strain:   . Difficulty of Paying Living Expenses:   Food Insecurity:   . Worried About Charity fundraiser in the Last Year:   . Arboriculturist in the Last Year:   Transportation Needs:   . Film/video editor (Medical):   Marland Kitchen Lack of Transportation (Non-Medical):   Physical Activity:   . Days of Exercise per Week:   . Minutes of Exercise per Session:   Stress:   . Feeling of Stress :   Social Connections:   . Frequency of Communication with Friends and Family:   . Frequency of Social Gatherings with Friends and Family:   . Attends Religious  Services:   . Active Member of Clubs or Organizations:   . Attends Archivist Meetings:   Marland Kitchen Marital Status:     Outpatient Medications Prior to Visit  Medication Sig Dispense Refill  . acetaminophen (TYLENOL) 500 MG tablet Take 500 mg by mouth every 6 (six) hours as needed for mild pain, moderate pain or headache.    . famotidine (PEPCID) 20 MG tablet Take 1 tablet (20 mg total) by mouth 2 (two) times daily. 180 tablet 1  . fluticasone (FLONASE) 50 MCG/ACT nasal spray INHALE 2 SPRAYS IN EACH NOSTRIL EVERY DAY FOR RHINITIS (Patient taking differently: Place 2 sprays into both nostrils as needed for allergies. FOR RHINITIS) 16 g 5  . ibuprofen (ADVIL) 200 MG tablet Take 200 mg by mouth every 6 (six) hours as needed for mild pain or moderate pain.    Marland Kitchen SYNTHROID 100 MCG tablet TAKE 1 TABLET BY MOUTH EVERY DAY 90 tablet 1  . triamterene-hydrochlorothiazide (MAXZIDE-25) 37.5-25 MG tablet TAKE 1 TABLET BY MOUTH EVERY DAY 90 tablet 1   No facility-administered medications prior to visit.     EXAM:  BP 132/62   Pulse 72   Temp 97.6 F (36.4 C) (Other (Comment))   Ht 5\' 5"  (1.651 m)   Wt 205 lb (93 kg)   SpO2 96%   BMI 34.11 kg/m   Body mass index is 34.11 kg/m. Wt Readings from Last 3 Encounters:  10/25/19 205 lb (93 kg)  08/03/19 206 lb 14.4 oz (93.8 kg)  04/20/19 200 lb 12.8 oz (91.1 kg)    GENERAL: vitals reviewed and listed above, alert, oriented, appears well hydrated and in no acute distress HEENT: atraumatic, conjunctiva  clear, no obvious abnormalities on inspection of external nose and ears OP : masked  NECK: no obvious masses on inspection palpation thyroid pal  Not masses  LUNGS: clear to auscultation bilaterally, no wheezes, rales or rhonchi, good air movement Chest a bit sore cc area   Upper t joints  Abdomen:  Sof,t normal bowel sounds without hepatosplenomegaly, no guarding rebound or masses no CVA tenderness CV: HRRR, no clubbing cyanosis or  peripheral  edema nl cap refill  MS: moves all extremities without noticeable focal  abnormality  PSYCH: pleasant and cooperative, no obvious depression or anxiety Lab Results  Component Value Date   WBC 5.4 04/20/2019   HGB 12.9 04/20/2019   HCT 38.7 04/20/2019   PLT 201.0 04/20/2019   GLUCOSE 95 11/24/2018   CHOL 225 (H) 04/20/2019   TRIG 145.0 04/20/2019   HDL 53.60 04/20/2019   LDLDIRECT 131.1 08/09/2009   LDLCALC 142 (H) 04/20/2019   ALT 13 11/24/2018   AST 19 11/24/2018   NA 141 11/24/2018   K 3.9 11/24/2018   CL 107 11/24/2018   CREATININE 0.89 11/24/2018   BUN 14 11/24/2018   CO2 20 (L) 11/24/2018   TSH 0.509 11/24/2018   INR 0.9 04/14/2007   HGBA1C 5.4 08/03/2019   BP Readings from Last 3 Encounters:  10/25/19 132/62  08/03/19 128/76  07/03/19 (!) 113/54    ASSESSMENT AND PLAN:  Discussed the following assessment and plan:  Medication management - Plan: Basic metabolic panel, Hepatic function panel, Lipid panel, TSH, Hemoglobin A1c  Essential hypertension - Plan: Basic metabolic panel, Hepatic function panel, Lipid panel, TSH, Hemoglobin A1c  Multinodular goiter - Plan: Basic metabolic panel, Hepatic function panel, Lipid panel, TSH, Hemoglobin A1c  Hyperlipidemia, unspecified hyperlipidemia type - Plan: Basic metabolic panel, Hepatic function panel, Lipid panel, TSH, Hemoglobin A1c  Atypical chest pain chest soreness  - Plan: Basic metabolic panel, Hepatic function panel, Lipid panel, TSH, Hemoglobin A1c  Dizziness - episodic  in am  no progression or  new  assoc sx  uncertain cause short lived  Due for chemistry in April  No new sx  Still having some  dizzy feeing  In ams  But not correlated but does get jittery after  simple carbs in am   Chest soreness really seems lik CWP   Will folllow   bp at goal  Plan updated labs  In April    And then rov  About 6 mos  -Patient advised to return or notify health care team  if  new concerns arise.  Patient Instructions    Acts like chest wall soreness   BP is good   Get fasting labs  Appt.   Avoid   simple sugars. And processed  Carbohydrates .   Follow . If all   Good on labs then we can see  You in 6 months .          Standley Brooking. Joyce Heitman M.D.

## 2019-10-25 ENCOUNTER — Ambulatory Visit (INDEPENDENT_AMBULATORY_CARE_PROVIDER_SITE_OTHER): Payer: Medicare Other | Admitting: Internal Medicine

## 2019-10-25 ENCOUNTER — Encounter: Payer: Self-pay | Admitting: Internal Medicine

## 2019-10-25 ENCOUNTER — Other Ambulatory Visit: Payer: Self-pay

## 2019-10-25 VITALS — BP 132/62 | HR 72 | Temp 97.6°F | Ht 65.0 in | Wt 205.0 lb

## 2019-10-25 DIAGNOSIS — R0789 Other chest pain: Secondary | ICD-10-CM | POA: Diagnosis not present

## 2019-10-25 DIAGNOSIS — I10 Essential (primary) hypertension: Secondary | ICD-10-CM | POA: Diagnosis not present

## 2019-10-25 DIAGNOSIS — E785 Hyperlipidemia, unspecified: Secondary | ICD-10-CM

## 2019-10-25 DIAGNOSIS — E042 Nontoxic multinodular goiter: Secondary | ICD-10-CM

## 2019-10-25 DIAGNOSIS — R42 Dizziness and giddiness: Secondary | ICD-10-CM

## 2019-10-25 DIAGNOSIS — Z79899 Other long term (current) drug therapy: Secondary | ICD-10-CM

## 2019-10-25 NOTE — Patient Instructions (Addendum)
Acts like chest wall soreness   BP is good   Get fasting labs  Appt.   Avoid   simple sugars. And processed  Carbohydrates .   Follow . If all   Good on labs then we can see  You in 6 months .

## 2019-11-02 ENCOUNTER — Ambulatory Visit: Payer: Medicare Other | Attending: Internal Medicine

## 2019-11-02 DIAGNOSIS — Z23 Encounter for immunization: Secondary | ICD-10-CM

## 2019-11-02 NOTE — Progress Notes (Signed)
   Covid-19 Vaccination Clinic  Name:  [name] [name]    MRN: [id number] DOB: Feb 26, 1941  11/02/2019  [name] was observed post Covid-19 immunization for 15 minutes without incident. She was provided with Vaccine Information Sheet and instruction to access the V-Safe system.   [name] was instructed to call 911 with any severe reactions post vaccine: Marland Kitchen Difficulty breathing  . Swelling of face and throat  . A fast heartbeat  . A bad rash all over body  . Dizziness and weakness   Immunizations Administered    Name Date Dose VIS Date Route   Pfizer COVID-19 Vaccine 11/02/2019  1:38 PM 0.3 mL 07/23/2019 Intramuscular   Manufacturer: [place]   Lot: [id number]   [place]: [id number]

## 2019-11-11 ENCOUNTER — Other Ambulatory Visit: Payer: Self-pay

## 2019-11-15 ENCOUNTER — Other Ambulatory Visit: Payer: Self-pay

## 2019-11-15 ENCOUNTER — Other Ambulatory Visit (INDEPENDENT_AMBULATORY_CARE_PROVIDER_SITE_OTHER): Payer: Medicare Other

## 2019-11-15 DIAGNOSIS — R0789 Other chest pain: Secondary | ICD-10-CM | POA: Diagnosis not present

## 2019-11-15 DIAGNOSIS — E785 Hyperlipidemia, unspecified: Secondary | ICD-10-CM | POA: Diagnosis not present

## 2019-11-15 DIAGNOSIS — E042 Nontoxic multinodular goiter: Secondary | ICD-10-CM | POA: Diagnosis not present

## 2019-11-15 DIAGNOSIS — I10 Essential (primary) hypertension: Secondary | ICD-10-CM

## 2019-11-15 DIAGNOSIS — Z79899 Other long term (current) drug therapy: Secondary | ICD-10-CM | POA: Diagnosis not present

## 2019-11-15 LAB — BASIC METABOLIC PANEL
BUN: 21 mg/dL (ref 6–23)
CO2: 28 mEq/L (ref 19–32)
Calcium: 9.5 mg/dL (ref 8.4–10.5)
Chloride: 99 mEq/L (ref 96–112)
Creatinine, Ser: 0.88 mg/dL (ref 0.40–1.20)
GFR: 75.22 mL/min (ref >60.00)
Glucose, Bld: 95 mg/dL (ref 70–99)
Potassium: 4.4 mEq/L (ref 3.5–5.1)
Sodium: 137 mEq/L (ref 135–145)

## 2019-11-15 LAB — HEMOGLOBIN A1C: Hgb A1c MFr Bld: 5.9 % (ref 4.6–6.5)

## 2019-11-15 LAB — TSH: TSH: 0.82 u[IU]/mL (ref 0.35–4.50)

## 2019-11-15 LAB — HEPATIC FUNCTION PANEL
ALT: 11 U/L (ref 0–35)
AST: 20 U/L (ref 0–37)
Albumin: 4.3 g/dL (ref 3.5–5.2)
Alkaline Phosphatase: 58 U/L (ref 39–117)
Bilirubin, Direct: 0.1 mg/dL (ref 0.0–0.3)
Total Bilirubin: 0.5 mg/dL (ref 0.2–1.2)
Total Protein: 7 g/dL (ref 6.0–8.3)

## 2019-11-15 LAB — LIPID PANEL
Cholesterol: 218 mg/dL — ABNORMAL HIGH (ref 0–200)
HDL: 55.9 mg/dL (ref >39.00)
LDL Cholesterol: 137 mg/dL — ABNORMAL HIGH (ref 0–99)
NonHDL: 162.19
Total CHOL/HDL Ratio: 4
Triglycerides: 125 mg/dL (ref 0.0–149.0)
VLDL: 25 mg/dL (ref 0.0–40.0)

## 2019-11-19 NOTE — Progress Notes (Signed)
Blood work  all in range  and no diabetes  Except cholesterol about the same  Keep attending to healthy eating and activity No change in plans

## 2019-11-25 ENCOUNTER — Telehealth: Payer: Self-pay | Admitting: Internal Medicine

## 2019-11-25 NOTE — Telephone Encounter (Signed)
Pt was returning Megan's call about her lab work. She would like a return call at [phone] and would prefer a call between 2pm-3pm

## 2019-11-26 NOTE — Telephone Encounter (Signed)
Called patient and gave her lab results. Patient verbalized an understanding.

## 2019-11-30 ENCOUNTER — Other Ambulatory Visit: Payer: Self-pay

## 2019-12-01 ENCOUNTER — Encounter: Payer: Self-pay | Admitting: Internal Medicine

## 2019-12-01 ENCOUNTER — Other Ambulatory Visit: Payer: Self-pay

## 2019-12-01 ENCOUNTER — Ambulatory Visit (INDEPENDENT_AMBULATORY_CARE_PROVIDER_SITE_OTHER): Payer: Medicare Other | Admitting: Internal Medicine

## 2019-12-01 VITALS — BP 134/86 | HR 69 | Temp 97.6°F | Ht 65.0 in | Wt 200.8 lb

## 2019-12-01 DIAGNOSIS — R519 Headache, unspecified: Secondary | ICD-10-CM

## 2019-12-01 DIAGNOSIS — R42 Dizziness and giddiness: Secondary | ICD-10-CM

## 2019-12-01 DIAGNOSIS — Z9181 History of falling: Secondary | ICD-10-CM | POA: Diagnosis not present

## 2019-12-01 DIAGNOSIS — G479 Sleep disorder, unspecified: Secondary | ICD-10-CM | POA: Diagnosis not present

## 2019-12-01 DIAGNOSIS — R55 Syncope and collapse: Secondary | ICD-10-CM | POA: Diagnosis not present

## 2019-12-01 DIAGNOSIS — G4733 Obstructive sleep apnea (adult) (pediatric): Secondary | ICD-10-CM | POA: Diagnosis not present

## 2019-12-01 NOTE — Patient Instructions (Addendum)
Your exam is good  Today  But since you have had headaches and dizziness  We are going to get a head scan .  mri Go slow    When movement and good shoes   And hold on to railing .  Then plan follow up.  After that  Stay hydrated .  In interim

## 2019-12-01 NOTE — Progress Notes (Signed)
This visit occurred during the SARS-CoV-2 public health emergency.  Safety protocols were in place, including screening questions prior to the visit, additional usage of staff PPE, and extensive cleaning of exam room while observing appropriate contact time as indicated for disinfecting solutions.    Chief Complaint  Patient presents with  . Dizziness    dizzy when walking, off balance, head aches at times, has fallen a couple of times a few months ago    HPI: [name] [name] 79 y.o. come in for  Above issues  Says  Ok in am   Then when moveing  around and then dizzy when moving around  Like a pressure  In head   And last  Comes and goes  .   kinda off balance.  At times  .  Aching in forehead  And fell a couple of times.   2-3 months ago  When in yard  turnning to get ball and slipped  On cement.   Seen in urgent care.  Had blacked out.  When right arm hurt   From fall when  got up.   To check felt. funny and felt falling and blacked out  Standing in bathroom and  Was told  Pain could do this. When went to UC  No discrete new numbness has had back surgery and spine disease but no new weakness  Sleep interrupted  Wakens  remot hs of mild osa  Per dr Gwenette Greet never reevaluated  ROS: See pertinent positives and negatives per HPI. Chest sx seem to be better ( see last year and saw cardiology)  No lovision   Past Medical History:  Diagnosis Date  . Adenomatous colon polyp   . Anxiety   . Cataract 2013   bilat removed   . DJD (degenerative joint disease) of lumbar spine   . Estrogen deficiency   . Gastritis   . GERD (gastroesophageal reflux disease)   . Headache(784.0)   . Helicobacter pylori infection 10/13/2007   Qualifier: Diagnosis of  By: Marland Mcalpine    . History of positive PPD 10/16/2011  . HSV-2 infection   . Hyperglycemia   . Hyperlipidemia    no meds now  . Hypertension   . IBS (irritable bowel syndrome)   . Medication side effect - Propofol (burning vein) and  prvastatin (myalgia) 04/08/2013   Pravastatin patient went off and rechallenged and symptoms recurred muscle cramps /foot cramps   . Neuromuscular disorder (HCC)    muscle cramps  . Pulsatile tinnitus    had MRA and MPV nl mild carotid dopplers2010  . Shingles 02/09/2011   Right upper face and eyelid but not involving the eye at this point.   . Sleep apnea    does not use c pap  . Thyroid disease   . Vulvitis     Family History  Problem Relation Age of Onset  . Alzheimer's disease Mother   . Heart attack Father        67  . Asthma Sister   . Colon cancer Sister   . Heart attack Brother        87  . Colon polyps Sister   . Colon cancer Sister   . Colon cancer Paternal Aunt   . Thyroid disease Sister        multinodular goiter  . Stomach cancer Neg Hx   . Breast cancer Neg Hx   . Rectal cancer Neg Hx     Social History  Socioeconomic History  . Marital status: Single    Spouse name: widowed.   . Number of children: Y  . Years of education: Not on file  . Highest education level: Not on file  Occupational History  . Occupation: retired.    Comment: [employer]----now retired.    Employer: RETIRED  Tobacco Use  . Smoking status: Former Smoker    Packs/day: 0.50    Years: 23.00    Pack years: 11.50    Types: Cigarettes    Quit date: 06/16/1984    Years since quitting: 35.4  . Smokeless tobacco: Never Used  Substance and Sexual Activity  . Alcohol use: No  . Drug use: No  . Sexual activity: Not Currently    Partners: Male    Birth control/protection: Surgical  Other Topics Concern  . Not on file  Social History Narrative   Retired    former smoker quit 1985   Widowed fall 2010 from myeloma. Lives alone.    Social Determinants of Health   Financial Resource Strain:   . Difficulty of Paying Living Expenses:   Food Insecurity:   . Worried About Charity fundraiser in the Last Year:   . Arboriculturist in the Last Year:   Transportation Needs:   . Lexicographer (Medical):   Marland Kitchen Lack of Transportation (Non-Medical):   Physical Activity:   . Days of Exercise per Week:   . Minutes of Exercise per Session:   Stress:   . Feeling of Stress :   Social Connections:   . Frequency of Communication with Friends and Family:   . Frequency of Social Gatherings with Friends and Family:   . Attends Religious Services:   . Active Member of Clubs or Organizations:   . Attends Archivist Meetings:   Marland Kitchen Marital Status:     Outpatient Medications Prior to Visit  Medication Sig Dispense Refill  . acetaminophen (TYLENOL) 500 MG tablet Take 500 mg by mouth every 6 (six) hours as needed for mild pain, moderate pain or headache.    Marland Kitchen acyclovir (ZOVIRAX) 400 MG tablet Take 400 mg by mouth 2 (two) times daily as needed.    Mariane Baumgarten Sodium (DSS) 100 MG CAPS as needed.     . fluticasone (FLONASE) 50 MCG/ACT nasal spray INHALE 2 SPRAYS IN EACH NOSTRIL EVERY DAY FOR RHINITIS (Patient taking differently: Place 2 sprays into both nostrils as needed for allergies. FOR RHINITIS) 16 g 5  . ibuprofen (ADVIL) 200 MG tablet Take 200 mg by mouth every 6 (six) hours as needed for mild pain or moderate pain.    Vladimir Faster Glycol-Propyl Glycol (SYSTANE) 0.4-0.3 % SOLN Systane (PF) 0.4 %-0.3 % eye drops in a dropperette    . SYNTHROID 100 MCG tablet TAKE 1 TABLET BY MOUTH EVERY DAY 90 tablet 1  . triamterene-hydrochlorothiazide (MAXZIDE-25) 37.5-25 MG tablet TAKE 1 TABLET BY MOUTH EVERY DAY 90 tablet 1  . famotidine (PEPCID) 20 MG tablet Take 1 tablet (20 mg total) by mouth 2 (two) times daily. (Patient not taking: Reported on 12/01/2019) 180 tablet 1   No facility-administered medications prior to visit.     EXAM:  BP 134/86   Pulse 69   Temp 97.6 F (36.4 C) (Temporal)   Ht 5\' 5"  (1.651 m)   Wt 200 lb 12.8 oz (91.1 kg)   SpO2 98%   BMI 33.41 kg/m   Body mass index is 33.41 kg/m.  GENERAL: vitals reviewed  and listed above, alert, oriented,  appears well hydrated and in no acute distress HEENT: atraumatic, conjunctiva  clear, no obvious abnormalities on inspection of external nose and ears OP :masked NECK: no obvious masses on inspection palpation  LUNGS: clear to auscultation bilaterally, no wheezes, rales or rhonchi, good air movement CV: HRRR, no clubbing cyanosis or  peripheral edema nl cap refill  MS: moves all extremities without noticeable focal  Abnormality NEURO: oriented x 3 CN 3-12 appear intact. No focal muscle weakness or atrophy. DTRs symmetrical. Gait WNL.  Grossly non focal. No tremor or abnormal movement. Neg rhonmberg no tremor     PSYCH: pleasant and cooperative, no obvious depression or anxiety Lab Results  Component Value Date   WBC 5.4 04/20/2019   HGB 12.9 04/20/2019   HCT 38.7 04/20/2019   PLT 201.0 04/20/2019   GLUCOSE 95 11/15/2019   CHOL 218 (H) 11/15/2019   TRIG 125.0 11/15/2019   HDL 55.90 11/15/2019   LDLDIRECT 131.1 08/09/2009   LDLCALC 137 (H) 11/15/2019   ALT 11 11/15/2019   AST 20 11/15/2019   NA 137 11/15/2019   K 4.4 11/15/2019   CL 99 11/15/2019   CREATININE 0.88 11/15/2019   BUN 21 11/15/2019   CO2 28 11/15/2019   TSH 0.82 11/15/2019   INR 0.9 04/14/2007   HGBA1C 5.9 11/15/2019   BP Readings from Last 3 Encounters:  12/01/19 134/86  10/25/19 132/62  08/03/19 128/76   Lab Results  Component Value Date   VITAMINB12 429 01/20/2017    ASSESSMENT AND PLAN:  Discussed the following assessment and plan:  Headache, unspecified headache type - Plan: MR Brain W Wo Contrast  History of fall - Plan: MR Brain W Wo Contrast  Syncope, unspecified syncope type - Plan: MR Brain W Wo Contrast  Dizziness - Plan: MR Brain W Wo Contrast  OSA (obstructive sleep apnea) - mild in past but frequent wakenings now    please reassess with sx  - Plan: Ambulatory referral to Pulmonology  Disturbance in sleep behavior - Plan: Ambulatory referral to Pulmonology Suppose  Diuretic can cause   Sx lightheadedness  But she has been on meds for  Years.  MRI brain  Refer to osa and sleep  Sx  Plan fu after that   Depending   consider changes to bp med  Etc  Some sx sound like orthostatic  -Patient advised to return or notify health care team  if  new concerns arise.  Patient Instructions  Your exam is good  Today  But since you have had headaches and dizziness  We are going to get a head scan .  mri Go slow    When movement and good shoes   And hold on to railing .  Then plan follow up.  After that  Stay hydrated .  In interim     Standley Brooking.  M.D.

## 2019-12-02 ENCOUNTER — Other Ambulatory Visit: Payer: Self-pay | Admitting: Internal Medicine

## 2019-12-09 DIAGNOSIS — H02831 Dermatochalasis of right upper eyelid: Secondary | ICD-10-CM | POA: Diagnosis not present

## 2019-12-09 DIAGNOSIS — H02834 Dermatochalasis of left upper eyelid: Secondary | ICD-10-CM | POA: Diagnosis not present

## 2019-12-09 DIAGNOSIS — R7309 Other abnormal glucose: Secondary | ICD-10-CM | POA: Diagnosis not present

## 2019-12-09 DIAGNOSIS — H35363 Drusen (degenerative) of macula, bilateral: Secondary | ICD-10-CM | POA: Diagnosis not present

## 2019-12-09 LAB — HM DIABETES EYE EXAM

## 2019-12-10 ENCOUNTER — Telehealth: Payer: Self-pay | Admitting: Internal Medicine

## 2019-12-10 NOTE — Chronic Care Management (AMB) (Signed)
  Chronic Care Management   Note  12/10/2019 Name: [name] [name] MRN: [id number] DOB: March 23, 1941  [name] [name] is a 79 y.o. year old female who is a primary care patient of Panosh, Standley Brooking, MD. I reached out to [name] [name] by phone today in response to a referral sent by [name]'s PCP, Panosh, Standley Brooking, MD.   [name] was given information about Chronic Care Management services today including:  1. CCM service includes personalized support from designated clinical staff supervised by her physician, including individualized plan of care and coordination with other care providers 2. 24/7 contact phone numbers for assistance for urgent and routine care needs. 3. Service will only be billed when office clinical staff spend 20 minutes or more in a month to coordinate care. 4. Only one practitioner may furnish and bill the service in a calendar month. 5. The patient may stop CCM services at any time (effective at the end of the month) by phone call to the office staff.   Patient agreed to services and verbal consent obtained.   Follow up plan:   [place]

## 2019-12-17 NOTE — Chronic Care Management (AMB) (Deleted)
Chronic Care Management Pharmacy Name: [facility]  MRN: [id number] DOB: [birth year]  Chief Complaint/ HPI  [name] [name],  79 y.o. , female presents for their {Initial/Follow-up:3041532} CCM visit with the clinical pharmacist {CHL HP Upstream Pharm visit [id number].  PCP : Burnis Medin, MD  Their chronic conditions include: HTN, GERD, HLD  Office Visits: 12/01/19 OV - Seen for headache. Ordered head scan. No changes with mediations.  10/25/19 OV - Medication management. Ordered BMP, liver panel, lipid panel, TSH, A1c  Consult Visit: 08/03/19 ([facility]) - Hyperglycemia with sugar of 96 2.5 hrs post prandial with A1c of 5.4%. No changes with medications.  Medications: Outpatient Encounter Medications as of 12/21/2019  Medication Sig  . acetaminophen (TYLENOL) 500 MG tablet Take 500 mg by mouth every 6 (six) hours as needed for mild pain, moderate pain or headache.  Marland Kitchen acyclovir (ZOVIRAX) 400 MG tablet Take 400 mg by mouth 2 (two) times daily as needed.  Mariane Baumgarten Sodium (DSS) 100 MG CAPS as needed.   . famotidine (PEPCID) 20 MG tablet Take 1 tablet (20 mg total) by mouth 2 (two) times daily. (Patient not taking: Reported on 12/01/2019)  . fluticasone (FLONASE) 50 MCG/ACT nasal spray Place 2 sprays into both nostrils as needed for allergies. FOR RHINITIS  . ibuprofen (ADVIL) 200 MG tablet Take 200 mg by mouth every 6 (six) hours as needed for mild pain or moderate pain.  Vladimir Faster Glycol-Propyl Glycol (SYSTANE) 0.4-0.3 % SOLN Systane (PF) 0.4 %-0.3 % eye drops in a dropperette  . SYNTHROID 100 MCG tablet TAKE 1 TABLET BY MOUTH EVERY DAY  . triamterene-hydrochlorothiazide (MAXZIDE-25) 37.5-25 MG tablet TAKE 1 TABLET BY MOUTH EVERY DAY   No facility-administered encounter medications on file as of 12/21/2019.    Current Diagnosis/Assessment:  Goals Addressed   None    Hypertension   BP today is:  {CHL HP UPSTREAM Pharmacist BP ranges:[phone]}   Office blood pressures are  BP Readings from Last 3 Encounters:  12/01/19 134/86  10/25/19 132/62  08/03/19 128/76    Patient has failed these meds in the past: None Patient is currently {CHL Controlled/Uncontrolled:[phone]} on the following medications:   Triamterene/HCTZ 37.5-25 mg 1 tablet daily  Patient checks BP at home {CHL HP BP Monitoring Frequency:[id number]}  Patient home BP readings are ranging: ***  We discussed {CHL HP Upstream Pharmacy discussion:[phone]}  Plan  Continue {CHL HP Upstream Pharmacy [phone]}   Hyperlipidemia   Lipid Panel     Component Value Date/Time   CHOL 218 (H) 11/15/2019 0735   TRIG 125.0 11/15/2019 0735   HDL 55.90 11/15/2019 0735   CHOLHDL 4 11/15/2019 0735   VLDL 25.0 11/15/2019 0735   LDLCALC 137 (H) 11/15/2019 0735   LDLDIRECT 131.1 08/09/2009 0929     The 10-year ASCVD risk score [name], et al., 2013) is: 19.3%   Values used to calculate the score:     Age: 68 years     Sex: Female     Is Non-Hispanic African American: Yes     Diabetic: No     Tobacco smoker: No     Systolic Blood Pressure: [id number] mmHg     Is BP treated: Yes     HDL Cholesterol: 55.9 mg/dL     Total Cholesterol: 218 mg/dL   Patient has failed these meds in past: pravastatin, simvastatin, Patient is currently {CHL Controlled/Uncontrolled:[phone]} on the following medications: None  We discussed:  {CHL HP Upstream Pharmacy discussion:[phone]}  Plan  Continue {CHL HP Upstream Pharmacy Plans:[phone]}  Thyroid   TSH  Date Value Ref Range Status  11/15/2019 0.82 0.35 - 4.50 uIU/mL Final     Patient has failed these meds in past: *** Patient is currently {CHL Controlled/Uncontrolled:[phone]} on the following medications:   Levothyroxine 100 mcg 1 tablet daily  We discussed:  {CHL HP Upstream Pharmacy discussion:[phone]}  Plan  Continue {CHL HP Upstream Pharmacy Plans:[phone]}  GERD   Patient has failed  these meds in past: *** Patient is currently {CHL Controlled/Uncontrolled:[phone]} on the following medications:   Famotidine 20 mg 1 tablet BID ** still taking??***  We discussed:  ***  Plan  Continue {CHL HP Upstream Pharmacy [id number]    OTCs/Health Maintenance   Patient is currently {CHL Controlled/Uncontrolled:[phone]} on the following medications:  Acetaminophen 500 mg 1 tab;et every 6 hrs PRN for pain or headache Ibuprofen 200 mg 1 tablet every 6 hrs as needed for pain Acyclovir 400 mg 1 tablet BID PRN Docusate 100 mg 1 capsule PRN Fluticasone 50 mcg/act nasal spray 2 sprays into both nostrils as needed for allergies/rhinitis Systane eyes drops    We discussed:  ***  Plan  Continue {CHL HP Upstream Pharmacy [id number]   Vaccines   Reviewed and discussed patient's vaccination history.    Immunization History  Administered Date(s) Administered  . Fluad Quad(high Dose 65+) 04/20/2019  . Influenza Split 06/20/2011, 06/23/2012  . Influenza Whole 05/17/2008, 06/11/2010  . Influenza, High Dose Seasonal PF 05/18/2015, 09/05/2016, 06/05/2017, 04/14/2018  . Influenza,inj,Quad PF,6+ Mos 04/08/2013, 05/25/2014  . Influenza-Unspecified 05/12/2017  . PFIZER SARS-COV-2 Vaccination 10/07/2019, 11/02/2019  . Pneumococcal Conjugate-13 11/05/2013  . Pneumococcal Polysaccharide-23 02/05/2008  . Td 08/12/2004  . Tdap 06/23/2012  . Zoster 03/28/2011    Plan  Recommended patient receive *** vaccine in *** office/pharmacy.   Medication Management   Pharmacy/Benefits: Adherence:  Pt endorses ***% compliance  We discussed: ***  Plan  {US Pharmacy [id number]   Follow up: *** month phone visit   Geraldine Contras, PharmD Clinical Pharmacist [facility] [place] [phone]

## 2019-12-21 ENCOUNTER — Ambulatory Visit: Payer: Medicare Other

## 2019-12-21 ENCOUNTER — Other Ambulatory Visit: Payer: Self-pay

## 2019-12-21 DIAGNOSIS — E785 Hyperlipidemia, unspecified: Secondary | ICD-10-CM

## 2019-12-21 DIAGNOSIS — I10 Essential (primary) hypertension: Secondary | ICD-10-CM

## 2019-12-22 ENCOUNTER — Telehealth: Payer: Self-pay | Admitting: Internal Medicine

## 2019-12-22 NOTE — Chronic Care Management (AMB) (Signed)
°  Chronic Care Management   Outreach Note  12/22/2019 Name: [name] [name] MRN: [id number] DOB: [birth year]  Referred by: Burnis Medin, MD Reason for referral : Chronic Care Management   An unsuccessful telephone outreach was attempted today. The patient was referred to the pharmacist for assistance with care management and care coordination.   Follow Up Plan:   [place]

## 2019-12-24 ENCOUNTER — Telehealth: Payer: Self-pay | Admitting: Internal Medicine

## 2019-12-24 NOTE — Chronic Care Management (AMB) (Signed)
°  Chronic Care Management   Outreach Note  12/24/2019 Name: [name] [name] MRN: [id number] DOB: 11/03/1940  Referred by: Burnis Medin, MD Reason for referral : Chronic Care Management   An unsuccessful telephone outreach was attempted today. The patient was referred to the pharmacist for assistance with care management and care coordination.   Follow Up Plan:   [place]

## 2019-12-25 ENCOUNTER — Other Ambulatory Visit: Payer: Self-pay

## 2019-12-25 ENCOUNTER — Ambulatory Visit
Admission: RE | Admit: 2019-12-25 | Discharge: 2019-12-25 | Disposition: A | Discharge status: Home / Self Care | Payer: Medicare Other | Source: Ambulatory Visit | Attending: Internal Medicine | Admitting: Internal Medicine

## 2019-12-25 DIAGNOSIS — R296 Repeated falls: Secondary | ICD-10-CM | POA: Diagnosis not present

## 2019-12-25 DIAGNOSIS — R55 Syncope and collapse: Secondary | ICD-10-CM

## 2019-12-25 DIAGNOSIS — R42 Dizziness and giddiness: Secondary | ICD-10-CM

## 2019-12-25 DIAGNOSIS — R519 Headache, unspecified: Secondary | ICD-10-CM

## 2019-12-25 DIAGNOSIS — Z9181 History of falling: Secondary | ICD-10-CM

## 2019-12-25 MED ORDER — GADOBENATE DIMEGLUMINE 529 MG/ML IV SOLN
18.0000 mL | Freq: Once | INTRAVENOUS | Status: AC | PRN
  Administered 2019-12-25: 18 mL via INTRAVENOUS

## 2019-12-27 ENCOUNTER — Telehealth: Payer: Self-pay | Admitting: Internal Medicine

## 2019-12-27 NOTE — Progress Notes (Signed)
MRI shows no acute lesions but some small  micro bleeding areas of unknown  significances   Please arrange a neurology consult   dx headaches and abnormal mri brain  keep the  pulmonary sleep  appt to check for OSA

## 2019-12-28 ENCOUNTER — Other Ambulatory Visit: Payer: Self-pay

## 2019-12-28 DIAGNOSIS — R9089 Other abnormal findings on diagnostic imaging of central nervous system: Secondary | ICD-10-CM

## 2019-12-28 DIAGNOSIS — R519 Headache, unspecified: Secondary | ICD-10-CM

## 2019-12-30 ENCOUNTER — Encounter: Payer: Self-pay | Admitting: Neurology

## 2019-12-31 NOTE — Progress Notes (Signed)
°  Chronic Care Management   Outreach Note  12/31/2019 Name: [name] [name] MRN: [id number] DOB: 1940/08/26  Referred by: Burnis Medin, MD Reason for referral : No chief complaint on file.   Pt needs to check her schedule and will call us back to schedule appointment  Follow Up Plan:   [place]

## 2020-01-05 ENCOUNTER — Telehealth: Payer: Self-pay | Admitting: Internal Medicine

## 2020-01-05 NOTE — Progress Notes (Signed)
  Chronic Care Management   Outreach Note  01/05/2020 Name: [name] [name] MRN: [id number] DOB: Apr 29, 1941  Referred by: Burnis Medin, MD Reason for referral : No chief complaint on file.   An unsuccessful telephone outreach was attempted today. The patient was referred to the pharmacist for assistance with care management and care coordination.   Follow Up Plan:   [place]

## 2020-01-18 ENCOUNTER — Encounter: Payer: Self-pay | Admitting: Pulmonary Disease

## 2020-01-18 ENCOUNTER — Other Ambulatory Visit: Payer: Self-pay

## 2020-01-18 ENCOUNTER — Ambulatory Visit (INDEPENDENT_AMBULATORY_CARE_PROVIDER_SITE_OTHER): Payer: Medicare Other | Admitting: Pulmonary Disease

## 2020-01-18 VITALS — BP 130/80 | HR 86 | Temp 98.8°F | Ht 65.0 in | Wt 203.0 lb

## 2020-01-18 DIAGNOSIS — G4733 Obstructive sleep apnea (adult) (pediatric): Secondary | ICD-10-CM

## 2020-01-18 NOTE — Progress Notes (Signed)
[name] [name]    [id number]    1940/12/20  Primary Care Physician:Panosh, Standley Brooking, MD  Referring Physician: Burnis Medin, MD [place],  [place] [id number]  Chief complaint:   Patient being seen for possible obstructive sleep apnea  HPI:  She did have the past history of mild obstructive sleep apnea diagnosed in 2013 She did not start CPAP at the time She was able to lose significant weight  About 2 to 3 years ago she was having some issues and an issue of sleep disordered breathing was placed at the time as well  She has chronic headaches Dryness of the mouth in the mornings She wakes up about every hour She falls asleep at inopportune times  Recently had an MRI in the evaluation of chronic headaches Had been discussing concerns about possible low oxygen levels contributing to chronic headaches  No family history of obstructive sleep apnea though has a son with sleep apnea Weight is well managed  She is sleepy, tired during the day, falls asleep easily during the day  Outpatient Encounter Medications as of 01/18/2020  Medication Sig  . acetaminophen (TYLENOL) 500 MG tablet Take 500 mg by mouth every 6 (six) hours as needed for mild pain, moderate pain or headache.  Marland Kitchen acyclovir (ZOVIRAX) 400 MG tablet Take 400 mg by mouth 2 (two) times daily as needed.  . famotidine (PEPCID) 20 MG tablet Take 1 tablet (20 mg total) by mouth 2 (two) times daily.  . fluticasone (FLONASE) 50 MCG/ACT nasal spray Place 2 sprays into both nostrils as needed for allergies. FOR RHINITIS  . ibuprofen (ADVIL) 200 MG tablet Take 200 mg by mouth every 6 (six) hours as needed for mild pain or moderate pain.  Vladimir Faster Glycol-Propyl Glycol (SYSTANE) 0.4-0.3 % SOLN Systane (PF) 0.4 %-0.3 % eye drops in a dropperette  . SYNTHROID 100 MCG tablet TAKE 1 TABLET BY MOUTH EVERY DAY  . triamterene-hydrochlorothiazide (MAXZIDE-25) 37.5-25 MG tablet TAKE 1 TABLET BY MOUTH EVERY  DAY  . [DISCONTINUED] Docusate Sodium (DSS) 100 MG CAPS as needed.    No facility-administered encounter medications on file as of 01/18/2020.    Allergies as of 01/18/2020 - Review Complete 01/18/2020  Allergen Reaction Noted  . Lisinopril  08/28/2006  . Lyrica [pregabalin] Other (See Comments) 02/09/2011  . Pravastatin Other (See Comments) 04/08/2013  . Simvastatin  08/27/2012  . Codeine phosphate  07/22/2005  . Oxycodone hcl  08/28/2006  . Strawberry extract  12/11/2017    Past Medical History:  Diagnosis Date  . Adenomatous colon polyp   . Anxiety   . Cataract 2013   bilat removed   . DJD (degenerative joint disease) of lumbar spine   . Estrogen deficiency   . Gastritis   . GERD (gastroesophageal reflux disease)   . Headache(784.0)   . Helicobacter pylori infection 10/13/2007   Qualifier: Diagnosis of  By: Marland Mcalpine    . History of positive PPD 10/16/2011  . HSV-2 infection   . Hyperglycemia   . Hyperlipidemia    no meds now  . Hypertension   . IBS (irritable bowel syndrome)   . Medication side effect - Propofol (burning vein) and prvastatin (myalgia) 04/08/2013   Pravastatin patient went off and rechallenged and symptoms recurred muscle cramps /foot cramps   . Neuromuscular disorder (HCC)    muscle cramps  . Pulsatile tinnitus    had MRA and MPV nl mild  carotid dopplers2010  . Shingles 02/09/2011   Right upper face and eyelid but not involving the eye at this point.   . Sleep apnea    does not use c pap  . Thyroid disease   . Vulvitis     Past Surgical History:  Procedure Laterality Date  . ABDOMINAL HYSTERECTOMY  1982   fibroids  . COLONOSCOPY  2003- 2102   adenomatous polyps  . FINGER SURGERY     left middle A1pulley release  . isthmuectomy     thyroid surgery  . LUMBAR SPINE SURGERY  05/1989   spine d/t tumor neurofibroma Quentin Cornwall  . POLYPECTOMY    . SHOULDER ARTHROSCOPY Bilateral    left, right 2017  . THYROIDECTOMY     partial rt  ballen   . TONSILLECTOMY     at age 73.   Marland Kitchen TOTAL HIP ARTHROPLASTY Right 10/02/2017   Procedure: RIGHT TOTAL HIP ARTHROPLASTY ANTERIOR APPROACH;  Surgeon: Rod Can, MD;  Location: [facility];  Service: Orthopedics;  Laterality: Right;  Needs RNFA  . TUBAL LIGATION  1974    Family History  Problem Relation Age of Onset  . Alzheimer's disease Mother   . Heart attack Father        39  . Asthma Sister   . Colon cancer Sister   . Heart attack Brother        26  . Colon polyps Sister   . Colon cancer Sister   . Colon cancer Paternal Aunt   . Thyroid disease Sister        multinodular goiter  . Stomach cancer Neg Hx   . Breast cancer Neg Hx   . Rectal cancer Neg Hx     Social History   Socioeconomic History  . Marital status: Single    Spouse name: widowed.   . Number of children: Y  . Years of education: Not on file  . Highest education level: Not on file  Occupational History  . Occupation: retired.    Comment: [employer]----now retired.    Employer: RETIRED  Tobacco Use  . Smoking status: Former Smoker    Packs/day: 0.50    Years: 23.00    Pack years: 11.50    Types: Cigarettes    Quit date: 06/16/1984    Years since quitting: 35.6  . Smokeless tobacco: Never Used  Substance and Sexual Activity  . Alcohol use: No  . Drug use: No  . Sexual activity: Not Currently    Partners: Male    Birth control/protection: Surgical  Other Topics Concern  . Not on file  Social History Narrative   Retired    former smoker quit 1985   Widowed fall 2010 from myeloma. Lives alone.    Social Determinants of Health   Financial Resource Strain:   . Difficulty of Paying Living Expenses:   Food Insecurity:   . Worried About Charity fundraiser in the Last Year:   . Arboriculturist in the Last Year:   Transportation Needs:   . Film/video editor (Medical):   Marland Kitchen Lack of Transportation (Non-Medical):   Physical Activity:   . Days of Exercise per Week:   . Minutes of Exercise per Session:    Stress:   . Feeling of Stress :   Social Connections:   . Frequency of Communication with Friends and Family:   . Frequency of Social Gatherings with Friends and Family:   . Attends Religious Services:   . Active  Member of Clubs or Organizations:   . Attends Archivist Meetings:   Marland Kitchen Marital Status:   Intimate Partner Violence:   . Fear of Current or Ex-Partner:   . Emotionally Abused:   Marland Kitchen Physically Abused:   . Sexually Abused:     Review of Systems  HENT: Negative.   Respiratory: Negative.  Negative for shortness of breath.   Cardiovascular: Negative.   Gastrointestinal: Negative.   Endocrine: Negative.   Genitourinary: Negative.     Vitals:   01/18/20 0850  BP: 130/80  Pulse: 86  Temp: 98.8 F (37.1 C)  SpO2: 98%     Physical Exam  Constitutional: She is oriented to person, place, and time. She appears well-developed and well-nourished.  HENT:  Head: Normocephalic and atraumatic.  Eyes: Pupils are equal, round, and reactive to light. Right eye exhibits no discharge.  Neck: No tracheal deviation present. No thyromegaly present.  Cardiovascular: Normal rate.  Pulmonary/Chest: Effort normal and breath sounds normal. No respiratory distress. She has no wheezes. She has no rales.  Musculoskeletal:        General: No edema. Normal range of motion.     Cervical back: Normal range of motion and neck supple.  Neurological: She is alert and oriented to person, place, and time.  Psychiatric: She has a normal mood and affect.   Results of the Epworth flowsheet 01/18/2020  Sitting and reading 3  Watching TV 3  Sitting, inactive in a public place (e.g. a theatre or a meeting) 1  As a passenger in a car for an hour without a break 0  Lying down to rest in the afternoon when circumstances permit 3  Sitting and talking to someone 0  Sitting quietly after a lunch without alcohol 1  In a car, while stopped for a few minutes in traffic 0  Total score 11    Sleep  study from 2013 reviewed showing mild obstructive sleep apnea  Assessment:  History of mild obstructive sleep apnea with worsening symptoms  Excessive daytime sleepiness  Pathophysiology of sleep disordered breathing discussed with the patient Treatment of sleep disordered breathing discussed with the patient   Plan/Recommendations We will schedule the patient for home sleep study  Treatment options as discussed above  Encouraged to call with any significant concerns  Follow-up in 3 months   Sherrilyn Rist MD [facility] 01/18/2020, 9:07 AM  CC: Panosh, Standley Brooking, MD

## 2020-01-18 NOTE — Patient Instructions (Signed)
Daytime sleepiness, fatigue, chronic headaches Moderate probability of significant obstructive sleep apnea as you had mild sleep apnea in the past  We will schedule you for a home sleep study We will update you with results as soon as reviewed  I will see you back in the office in about 3 months  Call with significant concerns

## 2020-01-20 NOTE — Addendum Note (Signed)
Addended by: Satira Sark D on: 01/20/2020 04:41 PM   Modules accepted: Orders

## 2020-02-10 ENCOUNTER — Ambulatory Visit (INDEPENDENT_AMBULATORY_CARE_PROVIDER_SITE_OTHER): Payer: Medicare Other | Admitting: Neurology

## 2020-02-10 ENCOUNTER — Encounter: Payer: Self-pay | Admitting: Neurology

## 2020-02-10 ENCOUNTER — Other Ambulatory Visit: Payer: Self-pay

## 2020-02-10 VITALS — BP 143/70 | HR 80 | Ht 65.0 in | Wt 202.2 lb

## 2020-02-10 DIAGNOSIS — I680 Cerebral amyloid angiopathy: Secondary | ICD-10-CM

## 2020-02-10 DIAGNOSIS — R42 Dizziness and giddiness: Secondary | ICD-10-CM

## 2020-02-10 DIAGNOSIS — I679 Cerebrovascular disease, unspecified: Secondary | ICD-10-CM

## 2020-02-10 DIAGNOSIS — R519 Headache, unspecified: Secondary | ICD-10-CM | POA: Diagnosis not present

## 2020-02-10 DIAGNOSIS — G4730 Sleep apnea, unspecified: Secondary | ICD-10-CM

## 2020-02-10 NOTE — Progress Notes (Signed)
NEUROLOGY CONSULTATION NOTE  [name] [name] MRN: [id number] DOB: [birth year]  Referring provider: Shanon Ace, MD Primary care provider: Shanon Ace, MD  Reason for consult:  headache  HISTORY OF PRESENT ILLNESS: [name] [name] is a 79 year old right-handed female with HTN, HLD, DJD of lumbar spine who presents for headache.  She started having headaches in early 2020, after "pulling a muscle in my chest".  They are moderate but sometimes severe non-throbbing pain across the forehead (as well as the top of head after bumping her head on the refrigerator door handle.  They may last after a couple of hours or all day.  They occur daily.  It usually starts in the morning within 10 minutes of getting up from bed.  No associated nausea, vomiting, photophobia, phonophobia or visual disturbance.  She treats with Tylenol, about 3 days a week.  This past year, they have been associated with dizziness, which is indescribable but most likely lightheadedness. She has fallen several times, one time associated with dizziness in which she passed out.  Other times, she has fallen without dizziness.  She may trip or stumble on the steps.  She has been seen by cardiology with negative workup.  She had an MRI of the brain on 12/26/2019, which was personally reviewed and demonstrated chronic small vessel ischemic changes as well as numerous chronic cerebral microhemorrhages sparing the deep gray nuclei and posterior fossa and partially empty sella but no acute intracranial abnormality.  She was diagnosed with mild sleep apnea in the past.  She is not on a CPAP.  She has an upcoming sleep study ordered.   PAST MEDICAL HISTORY: Past Medical History:  Diagnosis Date  . Adenomatous colon polyp   . Anxiety   . Cataract 2013   bilat removed   . DJD (degenerative joint disease) of lumbar spine   . Estrogen deficiency   . Gastritis   . GERD (gastroesophageal reflux disease)   . Headache(784.0)   .  Helicobacter pylori infection 10/13/2007   Qualifier: Diagnosis of  By: Marland Mcalpine    . History of positive PPD 10/16/2011  . HSV-2 infection   . Hyperglycemia   . Hyperlipidemia    no meds now  . Hypertension   . IBS (irritable bowel syndrome)   . Medication side effect - Propofol (burning vein) and prvastatin (myalgia) 04/08/2013   Pravastatin patient went off and rechallenged and symptoms recurred muscle cramps /foot cramps   . Neuromuscular disorder (HCC)    muscle cramps  . Pulsatile tinnitus    had MRA and MPV nl mild carotid dopplers2010  . Shingles 02/09/2011   Right upper face and eyelid but not involving the eye at this point.   . Sleep apnea    does not use c pap  . Thyroid disease   . Vulvitis     PAST SURGICAL HISTORY: Past Surgical History:  Procedure Laterality Date  . ABDOMINAL HYSTERECTOMY  1982   fibroids  . COLONOSCOPY  2003- 2102   adenomatous polyps  . FINGER SURGERY     left middle A1pulley release  . isthmuectomy     thyroid surgery  . LUMBAR SPINE SURGERY  05/1989   spine d/t tumor neurofibroma Quentin Cornwall  . POLYPECTOMY    . SHOULDER ARTHROSCOPY Bilateral    left, right 2017  . THYROIDECTOMY     partial rt  ballen  . TONSILLECTOMY     at age 64.   Marland Kitchen TOTAL HIP ARTHROPLASTY  Right 10/02/2017   Procedure: RIGHT TOTAL HIP ARTHROPLASTY ANTERIOR APPROACH;  Surgeon: Rod Can, MD;  Location: [facility];  Service: Orthopedics;  Laterality: Right;  Needs RNFA  . TUBAL LIGATION  1974    MEDICATIONS: Current Outpatient Medications on File Prior to Visit  Medication Sig Dispense Refill  . acetaminophen (TYLENOL) 500 MG tablet Take 500 mg by mouth every 6 (six) hours as needed for mild pain, moderate pain or headache.    Marland Kitchen acyclovir (ZOVIRAX) 400 MG tablet Take 400 mg by mouth 2 (two) times daily as needed.    . famotidine (PEPCID) 20 MG tablet Take 1 tablet (20 mg total) by mouth 2 (two) times daily. 180 tablet 1  . fluticasone (FLONASE) 50 MCG/ACT  nasal spray Place 2 sprays into both nostrils as needed for allergies. FOR RHINITIS 16 mL 5  . ibuprofen (ADVIL) 200 MG tablet Take 200 mg by mouth every 6 (six) hours as needed for mild pain or moderate pain.    Vladimir Faster Glycol-Propyl Glycol (SYSTANE) 0.4-0.3 % SOLN Systane (PF) 0.4 %-0.3 % eye drops in a dropperette    . SYNTHROID 100 MCG tablet TAKE 1 TABLET BY MOUTH EVERY DAY 90 tablet 1  . triamterene-hydrochlorothiazide (MAXZIDE-25) 37.5-25 MG tablet TAKE 1 TABLET BY MOUTH EVERY DAY 90 tablet 1   No current facility-administered medications on file prior to visit.    ALLERGIES: Allergies  Allergen Reactions  . Lisinopril     REACTION: COUGH  . Lyrica [Pregabalin] Other (See Comments)    Cns side efffects  . Pravastatin Other (See Comments)    Cramps.  . Simvastatin     Myalgia and stiffness  . Codeine Phosphate     REACTION: itching  . Oxycodone Hcl     REACTION: rash  . Strawberry Extract     FAMILY HISTORY: Family History  Problem Relation Age of Onset  . Alzheimer's disease Mother   . Heart attack Father        36  . Asthma Sister   . Colon cancer Sister   . Heart attack Brother        71  . Colon polyps Sister   . Colon cancer Sister   . Colon cancer Paternal Aunt   . Thyroid disease Sister        multinodular goiter  . Stomach cancer Neg Hx   . Breast cancer Neg Hx   . Rectal cancer Neg Hx     SOCIAL HISTORY: Social History   Socioeconomic History  . Marital status: Single    Spouse name: widowed.   . Number of children: Y  . Years of education: Not on file  . Highest education level: Not on file  Occupational History  . Occupation: retired.    Comment: [employer]----now retired.    Employer: RETIRED  Tobacco Use  . Smoking status: Former Smoker    Packs/day: 0.50    Years: 23.00    Pack years: 11.50    Types: Cigarettes    Quit date: 06/16/1984    Years since quitting: 35.6  . Smokeless tobacco: Never Used  Vaping Use  . Vaping Use: Never  used  Substance and Sexual Activity  . Alcohol use: No  . Drug use: No  . Sexual activity: Not Currently    Partners: Male    Birth control/protection: Surgical  Other Topics Concern  . Not on file  Social History Narrative   Retired    former smoker quit 1985  Widowed fall 2010 from myeloma. Lives alone.    Social Determinants of Health   Financial Resource Strain:   . Difficulty of Paying Living Expenses:   Food Insecurity:   . Worried About Charity fundraiser in the Last Year:   . Arboriculturist in the Last Year:   Transportation Needs:   . Film/video editor (Medical):   Marland Kitchen Lack of Transportation (Non-Medical):   Physical Activity:   . Days of Exercise per Week:   . Minutes of Exercise per Session:   Stress:   . Feeling of Stress :   Social Connections:   . Frequency of Communication with Friends and Family:   . Frequency of Social Gatherings with Friends and Family:   . Attends Religious Services:   . Active Member of Clubs or Organizations:   . Attends Archivist Meetings:   Marland Kitchen Marital Status:   Intimate Partner Violence:   . Fear of Current or Ex-Partner:   . Emotionally Abused:   Marland Kitchen Physically Abused:   . Sexually Abused:     PHYSICAL EXAM: Blood pressure (!) 143/70, pulse 80, height 5\' 5"  (1.651 m), weight 202 lb 3.2 oz (91.7 kg), SpO2 97 %. General: No acute distress.  Patient appears well-groomed.   Head:  Normocephalic/atraumatic Eyes:  fundi examined but not visualized Neck: supple, no paraspinal tenderness, full range of motion Back: No paraspinal tenderness Heart: regular rate and rhythm Lungs: Clear to auscultation bilaterally. Vascular: No carotid bruits. Neurological Exam: Mental status: alert and oriented to person, place, and time, recent and remote memory intact, fund of knowledge intact, attention and concentration intact, speech fluent and not dysarthric, language intact. Cranial nerves: CN I: not tested CN II: pupils  equal, round and reactive to light, visual fields intact CN III, IV, VI:  full range of motion, no nystagmus, no ptosis CN V: facial sensation intact CN VII: upper and lower face symmetric CN VIII: hearing intact CN IX, X: gag intact, uvula midline CN XI: sternocleidomastoid and trapezius muscles intact CN XII: tongue midline Bulk & Tone: normal, no fasciculations. Motor:  5/5 throughout  Sensation:  Pinprick intact; vibration sensation decreased in feet.  Deep Tendon Reflexes:  1+ upper/absent lower extremities, toes downgoing.   Finger to nose testing:  Without dysmetria.   Heel to shin:  Without dysmetria.   Gait:  Wide-based.  Romberg with sway  IMPRESSION: 1.  Chronic daily headache.  May be tension-type headache.  May be secondary to untreated OSA. 2.  Cerebral amyloid angiopathy 3.  Cerebrovascular disease 4.  Dizziness, vague 5.  Unsteady gait, may be related to dizziness but also complicated by probable underlying neuropathy. 6.  Sleep apnea  PLAN: 1.  Check CTA of head and neck given underlying cerebrovascular disease and dizziness. 2.  Advised to avoid blood thinners such as ASA and other NSAIDs to decrease risk of cerebral hemorrhage 3.  Limit use of pain relievers to no more than 2 days out of week to prevent risk of rebound or medication-overuse headache. 4.  She is supposed to have a sleep study.  Likely, she cannot be on any medication that I would prescribe for chronic headache.  She will first get the sleep study and we can start a headache preventative afterwards. 5.  Follow up after testing  Thank you for allowing me to take part in the care of this patient.  Metta Clines, DO  CC: Shanon Ace, MD

## 2020-02-10 NOTE — Patient Instructions (Addendum)
1.   We will check CTA of head and neck. We have sent a referral to [facility] for your CTA , and they will call you directly to schedule your appointment. They are located at [place]. If you need to contact them directly please call [phone].  2.  Avoid blood thinners such as aspirin, ibuprofen/Advil/Motrin, naproxen/Aleve.  May take Tylenol 3.  Limit use of pain relievers to no more than 2 days out of week to prevent risk of rebound or medication-overuse headache. 4.  Once you get the sleep study, contact me to start medication for headache

## 2020-02-24 NOTE — Chronic Care Management (AMB) (Signed)
Chronic Care Management Pharmacy  Name: [name] [name]  MRN: [id number] DOB: 1941-07-23  Initial Questions: 1. Have you seen any other providers since your last visit? NA 2. Any changes in your medicines or health? No   Chief Complaint/ HPI  [name] [name],  79 y.o. , female presents for their Initial CCM visit with the clinical pharmacist In office.  Patient reports she plans to be more active in senior group. She notes having hip surgery in 2019.   Patient reports symptoms of IBS/ GERD flare up due to eating certain food items (cream, butter).    PCP : Burnis Medin, MD  Their chronic conditions include: HTN, hyperglycemia, HLD, hypothyroidism, IBS, rhinitis  Office Visits: 12/01/2019- Shanon Ace, MD- Patient presented for office visit for dizziness. Patient to obtain MRI brain with without contrast. Patient referred to pulmonology for OSA and disturbance in sleep behavior.   10/25/19- Shanon Ace, MD- Patient presented for office visit for medication management. Patient to obtain labs: BMET, hepatic panel, lipid panel, TSH, A1c. Patient to follow up in 6 months.   Consult Visit: 02/10/2020- Neurology- Metta Clines, DO- Patient presented for office visit for headache. Patient to obtain CTA of head and neck. Patient to obtain sleep study first and then consider headache preventative afterwards.   01/18/2020- Pulmonary- Sherrilyn Rist, MD- Patient presented for office visit for possible OSA. Patient to obtain sleep study and follow up in 3 months.   Medications: Outpatient Encounter Medications as of 02/28/2020  Medication Sig  . acetaminophen (TYLENOL) 500 MG tablet Take 500 mg by mouth every 6 (six) hours as needed for mild pain, moderate pain or headache.  . famotidine (PEPCID) 20 MG tablet Take 1 tablet (20 mg total) by mouth 2 (two) times daily.  . fluticasone (FLONASE) 50 MCG/ACT nasal spray Place 2 sprays into both nostrils as needed for allergies. FOR RHINITIS  .  SYNTHROID 100 MCG tablet TAKE 1 TABLET BY MOUTH EVERY DAY  . triamterene-hydrochlorothiazide (MAXZIDE-25) 37.5-25 MG tablet TAKE 1 TABLET BY MOUTH EVERY DAY  . acyclovir (ZOVIRAX) 400 MG tablet Take 400 mg by mouth 2 (two) times daily as needed. (Patient not taking: Reported on 02/28/2020)  . ibuprofen (ADVIL) 200 MG tablet Take 200 mg by mouth every 6 (six) hours as needed for mild pain or moderate pain. (Patient not taking: Reported on 02/28/2020)  . Polyethyl Glycol-Propyl Glycol (SYSTANE) 0.4-0.3 % SOLN Systane (PF) 0.4 %-0.3 % eye drops in a dropperette (Patient not taking: Reported on 02/10/2020)   No facility-administered encounter medications on file as of 02/28/2020.     Current Diagnosis/Assessment:  Goals Addressed            This Visit's Progress   . Pharmacy Care Plan       CARE PLAN ENTRY (see longitudinal plan of care for additional care plan information)  Current Barriers:  . Chronic Disease Management support, education, and care coordination needs related to Hypertension, Hyperlipidemia, Hypothyroidism, and Hyperglycemia   Hypertension BP Readings from Last 3 Encounters:  02/10/20 (!) 143/70  01/18/20 130/80  12/01/19 134/86   . Pharmacist Clinical Goal(s): o Over the next 30 days, patient will work with PharmD and providers to maintain BP goal <130/80 . Current regimen:  o Triamterene- hydrochlorothiazide 37.5- 37m, 1 tablet once daily . Interventions: o Recommend:  .  diet modifications. DASH diet:  following a diet emphasizing fruits and vegetables and low-fat dairy products along with whole grains, fish, poultry, and nuts. Reducing red  meats and sugars.  . Exercising . Reducing the amount of salt intake to <1584m/per day.  . Recommend using a salt substitute to replace your salt if you need flavor.    . Weight reduction- We discussed losing 5-10% of body weight . Patient self care activities - Over the next 30 days, patient will: o Check BP as directed,  document, and provide at future appointments o Ensure daily salt intake < 2300 mg/day  Hyperlipidemia Lab Results  Component Value Date/Time   LDLCALC 137 (H) 11/15/2019 07:35 AM   LDLDIRECT 131.1 08/09/2009 09:29 AM   . Pharmacist Clinical Goal(s): o Over the next 30 days, patient will work with PharmD and providers to achieve LDL goal < 100 . Current regimen:  o No medications . Interventions: o Recommend trying ezetimibe (Zetia) 146m 1 tablet once daily . Patient self care activities - Over the next 30 days, patient will: o Continue current medications as directed by provider.   Hyperglycemia  Lab Results  Component Value Date/Time   HGBA1C 5.9 11/15/2019 07:35 AM   HGBA1C 5.4 08/03/2019 02:28 PM   HGBA1C 6.0 04/14/2018 10:33 AM   . Pharmacist Clinical Goal(s): o Over the next 90 days, patient will work with PharmD and providers to maintain A1c goal <6.5% . Current regimen:  o No medications . Interventions: o Recommend modifying lifestyle, including to participate in moderate physical activity (e.g., walking) at least 150 minutes per week. Discussed a Mediterranean eating plan with an emphasis on whole grains, legumes, nuts, fruits, and vegetables and minimal refined and processed foods.  . Patient self care activities - Over the next 30 days, patient will: o Continue to work on improving lifestyle (diet and exercise).   Hypothyroidism TSH  Date Value Ref Range Status  11/15/2019 0.82 0.35 - 4.50 uIU/mL Final .  Pharmacist Clinical Goal(s) o Over the next 90 days, patient will work with PharmD and providers to maintain TSH: 0.35 to 4.50 uIU/mL . Current regimen:  o Synthroid 10040m 1 tablet once daily  . Interventions: o We discussed:  levothyroxine administration in regards to food and other medications (take at least 30 minutes before first meal)  . Patient self care activities - Over the next 90 days, patient will: o Continue current medications.   Medication  management . Pharmacist Clinical Goal(s): o Over the next 90 days, patient will work with PharmD and providers to maintain optimal medication adherence . Current [facility]: [facility] . Interventions o Comprehensive medication review performed. o Continue current medication management strategy . Patient self care activities - Over the next 90 days, patient will: o Take medications as prescribed o Report any questions or concerns to PharmD and/or provider(s)  Initial goal documentation       SDOH Interventions     Most Recent Value  SDOH Interventions  Financial Strain Interventions Intervention Not Indicated  Transportation Interventions Intervention Not Indicated       Hyperglycemia    Recent Relevant Labs: Lab Results  Component Value Date/Time   HGBA1C 5.9 11/15/2019 07:35 AM   HGBA1C 5.4 08/03/2019 02:28 PM   HGBA1C 6.0 04/14/2018 10:33 AM    Patient is currently controlled on the following medications:   No medications  We discussed: diet and exercise extensively  Plan Continue control with diet and exercise   Hypertension   Office blood pressures are  BP Readings from Last 3 Encounters:  02/10/20 (!) 143/70  01/18/20 130/80  12/01/19 134/86   Patient has failed these meds  in the past: none  Patient checks BP at home: N/A  Patient home SBP readings are ranging: 120-130s   Patient is controlled on:   Triamterene- hydrochlorothiazide 37.5- 10m, 1 tablet once daily (AM)   We discussed diet and exercise extensively  . Discussed diet modifications. DASH diet:  following a diet emphasizing fruits and vegetables and low-fat dairy products along with whole grains, fish, poultry, and nuts. Reducing red meats and sugars.  . Exercising . Reducing the amount of salt intake to <15050mper day.  . Recommend using a salt substitute to replace your salt if you need flavor.    . Weight reduction- We discussed losing 5-10% of body weight  Plan Continue current  medications   Hyperlipidemia   LDL goal < 100  Lipid Panel     Component Value Date/Time   CHOL 218 (H) 11/15/2019 0735   TRIG 125.0 11/15/2019 0735   HDL 55.90 11/15/2019 0735   LDLCALC 137 (H) 11/15/2019 0735   LDLDIRECT 131.1 08/09/2009 0929    Hepatic Function Latest Ref Rng & Units 11/15/2019 11/24/2018 04/14/2018  Total Protein 6.0 - 8.3 g/dL 7.0 6.9 7.1  Albumin 3.5 - 5.2 g/dL 4.3 3.7 4.2  AST 0 - 37 U/L '20 19 23  ' ALT 0 - 35 U/L '11 13 19  ' Alk Phosphatase 39 - 117 U/L 58 49 54  Total Bilirubin 0.2 - 1.2 mg/dL 0.5 0.6 0.5  Bilirubin, Direct 0.0 - 0.3 mg/dL 0.1 - 0.1     The 10-year ASCVD risk score (GMikey BussingC Jr., et al., 2013) is: 21.6%   Values used to calculate the score:     Age: 89ears     Sex: Female     Is Non-Hispanic African American: Yes     Diabetic: No     Tobacco smoker: No     Systolic Blood Pressure: 14320mHg     Is BP treated: Yes     HDL Cholesterol: 55.9 mg/dL     Total Cholesterol: 218 mg/dL   Patient has failed these meds in past: pravastatin, simvastatin   Patient is currently uncontrolled on the following medications:  . No medications  We discussed:  diet and exercise extensively  . We discussed how a diet high in plant sterols (fruits/vegetables/nuts/whole grains/legumes) may reduce your cholesterol.  Encouraged increasing fiber to a daily intake of 10-25g/day . Discussed other non-statin medications for cholesterol.    Plan Consult with Dr. PaRegis Billrecommend  Zetia 1076m1 tablet once daily    Hypothyroidism   Lab Results  Component Value Date/Time   TSH 0.82 11/15/2019 07:35 AM   TSH 0.509 11/24/2018 01:15 PM   TSH 1.04 04/14/2018 10:33 AM   FREET4 0.91 11/24/2018 01:15 PM    Patient is currently controlled on the following medications:  . Synthroid 100m53m1 tablet once daily   We discussed:  levothyroxine administration in regards to food and other medications (take at least 30 minutes before first meal)   Plan Continue  current medications  IBS    Patient has failed these meds in past: none  Patient is currently controlled on the following medications:  . Famotidine 20mg65mtablet twice daily as needed   Plan Continue current medications  Pain    Patient is currently controlled on the following medications:  . Acetaminophen 500mg,1mablet every six hours as needed for mild pain, moderate pain or headache (reports taking at least 1/ week)  . Ibuprofen 200mg, 73mblet every  six hours as needed for mild pain or moderate pain (not taking right now)   Plan Continue current medications  Allergic rhinitis    Patient is currently controlled on the following medications:  . Fluticasone nasal spray, 2 sprays into both nostrils as needed for allergies   Plan Continue current medications  Vaccines   Reviewed and discussed patient's vaccination history.    Immunization History  Administered Date(s) Administered  . Fluad Quad(high Dose 65+) 04/20/2019  . Influenza Split 06/20/2011, 06/23/2012  . Influenza Whole 05/17/2008, 06/11/2010  . Influenza, High Dose Seasonal PF 05/18/2015, 09/05/2016, 06/05/2017, 04/14/2018  . Influenza,inj,Quad PF,6+ Mos 04/08/2013, 05/25/2014  . Influenza-Unspecified 05/12/2017  . PFIZER SARS-COV-2 Vaccination 10/07/2019, 11/02/2019  . Pneumococcal Conjugate-13 11/05/2013  . Pneumococcal Polysaccharide-23 02/05/2008  . Td 08/12/2004  . Tdap 06/23/2012  . Zoster 03/28/2011    Plan Patient stated getting both shingles vaccines (no record in patient's chart).   Medication Management  Patient organizes medications: uses pill box  [facility]  Adherence: no fill history   Follow up Follow up visit with PharmD in 1 month.    Anson Crofts, PharmD Clinical Pharmacist [facility] [place] [phone]

## 2020-02-28 ENCOUNTER — Other Ambulatory Visit: Payer: Self-pay

## 2020-02-28 ENCOUNTER — Ambulatory Visit: Payer: Medicare Other

## 2020-02-28 DIAGNOSIS — E890 Postprocedural hypothyroidism: Secondary | ICD-10-CM

## 2020-02-28 DIAGNOSIS — E785 Hyperlipidemia, unspecified: Secondary | ICD-10-CM

## 2020-02-28 DIAGNOSIS — R739 Hyperglycemia, unspecified: Secondary | ICD-10-CM

## 2020-02-28 DIAGNOSIS — I10 Essential (primary) hypertension: Secondary | ICD-10-CM

## 2020-02-29 ENCOUNTER — Other Ambulatory Visit: Payer: Medicare Other

## 2020-02-29 ENCOUNTER — Ambulatory Visit
Admission: RE | Admit: 2020-02-29 | Discharge: 2020-02-29 | Disposition: A | Discharge status: Home / Self Care | Payer: Medicare Other | Source: Ambulatory Visit | Attending: Neurology | Admitting: Neurology

## 2020-02-29 DIAGNOSIS — R42 Dizziness and giddiness: Secondary | ICD-10-CM

## 2020-02-29 DIAGNOSIS — R519 Headache, unspecified: Secondary | ICD-10-CM

## 2020-02-29 DIAGNOSIS — I680 Cerebral amyloid angiopathy: Secondary | ICD-10-CM

## 2020-02-29 DIAGNOSIS — I629 Nontraumatic intracranial hemorrhage, unspecified: Secondary | ICD-10-CM | POA: Diagnosis not present

## 2020-02-29 DIAGNOSIS — I679 Cerebrovascular disease, unspecified: Secondary | ICD-10-CM

## 2020-02-29 MED ORDER — IOPAMIDOL (ISOVUE-370) INJECTION 76%
75.0000 mL | Freq: Once | INTRAVENOUS | Status: AC | PRN
  Administered 2020-02-29: 75 mL via INTRAVENOUS

## 2020-03-01 ENCOUNTER — Other Ambulatory Visit: Payer: Self-pay

## 2020-03-01 ENCOUNTER — Ambulatory Visit: Payer: Medicare Other

## 2020-03-01 DIAGNOSIS — G4733 Obstructive sleep apnea (adult) (pediatric): Secondary | ICD-10-CM

## 2020-03-03 NOTE — Patient Instructions (Addendum)
Visit Information  Goals Addressed            This Visit's Progress   . Pharmacy Care Plan       CARE PLAN ENTRY (see longitudinal plan of care for additional care plan information)  Current Barriers:  . Chronic Disease Management support, education, and care coordination needs related to Hypertension, Hyperlipidemia, Hypothyroidism, and Hyperglycemia   Hypertension BP Readings from Last 3 Encounters:  02/10/20 (!) 143/70  01/18/20 130/80  12/01/19 134/86   . Pharmacist Clinical Goal(s): o Over the next 30 days, patient will work with PharmD and providers to maintain BP goal <130/80 . Current regimen:  o Triamterene- hydrochlorothiazide 37.5- 25mg , 1 tablet once daily . Interventions: o Recommend:  .  diet modifications. DASH diet:  following a diet emphasizing fruits and vegetables and low-fat dairy products along with whole grains, fish, poultry, and nuts. Reducing red meats and sugars.  . Exercising . Reducing the amount of salt intake to 1500mg /per day.  . Recommend using a salt substitute to replace your salt if you need flavor.    . Weight reduction- We discussed losing 5-10% of body weight . Patient self care activities - Over the next 30 days, patient will: o Check BP as directed, document, and provide at future appointments o Ensure daily salt intake < 2300 mg/day  Hyperlipidemia Lab Results  Component Value Date/Time   LDLCALC 137 (H) 11/15/2019 07:35 AM   LDLDIRECT 131.1 08/09/2009 09:29 AM   . Pharmacist Clinical Goal(s): o Over the next 30 days, patient will work with PharmD and providers to achieve LDL goal < 100 . Current regimen:  o No medications . Interventions: o Recommend trying ezetimibe (Zetia) 10mg , 1 tablet once daily . Patient self care activities - Over the next 30 days, patient will: o Continue current medications as directed by provider.   Hyperglycemia  Lab Results  Component Value Date/Time   HGBA1C 5.9 11/15/2019 07:35 AM   HGBA1C  5.4 08/03/2019 02:28 PM   HGBA1C 6.0 04/14/2018 10:33 AM   . Pharmacist Clinical Goal(s): o Over the next 90 days, patient will work with PharmD and providers to maintain A1c goal <6.5% . Current regimen:  o No medications . Interventions: o Recommend modifying lifestyle, including to participate in moderate physical activity (e.g., walking) at least 150 minutes per week. Discussed a Mediterranean eating plan with an emphasis on whole grains, legumes, nuts, fruits, and vegetables and minimal refined and processed foods.  . Patient self care activities - Over the next 30 days, patient will: o Continue to work on improving lifestyle (diet and exercise).   Hypothyroidism TSH  Date Value Ref Range Status  11/15/2019 0.82 0.35 - 4.50 uIU/mL Final .  Pharmacist Clinical Goal(s) o Over the next 90 days, patient will work with PharmD and providers to maintain TSH: 0.35 to 4.50 uIU/mL . Current regimen:  o Synthroid 163mcg, 1 tablet once daily  . Interventions: o We discussed:  levothyroxine administration in regards to food and other medications (take at least 30 minutes before first meal)  . Patient self care activities - Over the next 90 days, patient will: o Continue current medications.   Medication management . Pharmacist Clinical Goal(s): o Over the next 90 days, patient will work with PharmD and providers to maintain optimal medication adherence . Current pharmacy: [facility] . Interventions o Comprehensive medication review performed. o Continue current medication management strategy . Patient self care activities - Over the next 90 days, patient will:  o Take medications as prescribed o Report any questions or concerns to PharmD and/or provider(s)  Initial goal documentation        [name] [name] was given information about Chronic Care Management services today including:  1. CCM service includes personalized support from designated clinical staff supervised by her physician,  including individualized plan of care and coordination with other care providers 2. 24/7 contact phone numbers for assistance for urgent and routine care needs. 3. Standard insurance, coinsurance, copays and deductibles apply for chronic care management only during months in which we provide at least 20 minutes of these services. Most insurances cover these services at 100%, however patients may be responsible for any copay, coinsurance and/or deductible if applicable. This service may help you avoid the need for more expensive face-to-face services. 4. Only one practitioner may furnish and bill the service in a calendar month. 5. The patient may stop CCM services at any time (effective at the end of the month) by phone call to the office staff.  Patient agreed to services and verbal consent obtained.   The patient verbalized understanding of instructions provided today and agreed to receive a mailed copy of patient instruction and/or educational materials. Telephone follow up appointment with pharmacy team member scheduled for: 03/31/2020  Anson Crofts, PharmD Clinical Pharmacist [facility] [phone]   Hyperglycemia Hyperglycemia is when the sugar (glucose) level in your blood is too high. It may not cause symptoms. If you do have symptoms, they may include warning signs, such as:  Feeling more thirsty than normal.  Hunger.  Feeling tired.  Needing to pee (urinate) more than normal.  Blurry eyesight (vision). You may get other symptoms as it gets worse, such as:  Dry mouth.  Not being hungry (loss of appetite).  Fruity-smelling breath.  Weakness.  Weight gain or loss that is not planned. Weight loss may be fast.  A tingling or numb feeling in your hands or feet.  Headache.  Skin that does not bounce back quickly when it is lightly pinched and released (poor skin turgor).  Pain in your belly (abdomen).  Cuts or bruises that heal  slowly. High blood sugar can happen to people who do or do not have diabetes. High blood sugar can happen slowly or quickly, and it can be an emergency. Follow these instructions at home: General instructions  Take over-the-counter and prescription medicines only as told by your doctor.  Do not use products that contain nicotine or tobacco, such as cigarettes and e-cigarettes. If you need help quitting, ask your doctor.  Limit alcohol intake to no more than 1 drink per day for nonpregnant women and 2 drinks per day for men. One drink equals 12 oz of beer, 5 oz of wine, or 1 oz of hard liquor.  Manage stress. If you need help with this, ask your doctor.  Keep all follow-up visits as told by your doctor. This is important. Eating and drinking   Stay at a healthy weight.  Exercise regularly, as told by your doctor.  Drink enough fluid, especially when you: ? Exercise. ? Get sick. ? Are in hot temperatures.  Eat healthy foods, such as: ? Low-fat (lean) proteins. ? Complex carbs (complex carbohydrates), such as whole wheat bread or brown rice. ? Fresh fruits and vegetables. ? Low-fat dairy products. ? Healthy fats.  Drink enough fluid to keep your pee (urine) clear or pale yellow. If you have diabetes:   Make sure you know  the symptoms of hyperglycemia.  Follow your diabetes management plan, as told by your doctor. Make sure you: ? Take insulin and medicines as told. ? Follow your exercise plan. ? Follow your meal plan. Eat on time. Do not skip meals. ? Check your blood sugar as often as told. Make sure to check before and after exercise. If you exercise longer or in a different way than you normally do, check your blood sugar more often. ? Follow your sick day plan whenever you cannot eat or drink normally. Make this plan ahead of time with your doctor.  Share your diabetes management plan with people in your workplace, school, and household.  Check your urine for ketones  when you are ill and as told by your doctor.  Carry a card or wear jewelry that says that you have diabetes. Contact a doctor if:  Your blood sugar level is higher than 240 mg/dL (13.3 mmol/L) for 2 days in a row.  You have problems keeping your blood sugar in your target range.  High blood sugar happens often for you. Get help right away if:  You have trouble breathing.  You have a change in how you think, feel, or act (mental status).  You feel sick to your stomach (nauseous), and that feeling does not go away.  You cannot stop throwing up (vomiting). These symptoms may be an emergency. Do not wait to see if the symptoms will go away. Get medical help right away. Call your local emergency services (911 in the [place] Do not drive yourself to the hospital. Summary  Hyperglycemia is when the sugar (glucose) level in your blood is too high.  High blood sugar can happen to people who do or do not have diabetes.  Make sure you drink enough fluids, eat healthy foods, and exercise regularly.  Contact your doctor if you have problems keeping your blood sugar in your target range. This information is not intended to replace advice given to you by your health care provider. Make sure you discuss any questions you have with your health care provider. Document Revised: 04/15/2016 Document Reviewed: 04/15/2016 Elsevier Patient Education  [place].

## 2020-03-03 NOTE — Progress Notes (Signed)
The thyroid nodule  you have  was noted on your  recent head scan  I would like you to go back to  endocrinology for reassessment .   Last endocrinologist   was  awhile back (dr Loanne Drilling ) bcan refer  to team of your choice .

## 2020-03-06 NOTE — Progress Notes (Signed)
Chief Complaint  Patient presents with  . Follow-up    Doing okay, still having headaches and dizziness    HPI: [name] [name] 79 y.o. come in for  Fu scans of head and neck evaulation from dr Loretta Plume for  headahces dizzinress  And ct that showe dpossibl amyloid angiopathy but no acute findings but   Neck showed thyroid nodules  . That need fu  In remote past  Had seen endo and was stable  No sx  Noted  To have oHome sleep testing  Had brain vascular studie and no obstructive diseaes    Some mild plaque and aorta   Has also see  Pharmacist who  Reported that consdier lipid med non statin  She has had a few statins meds in past with se .     Dizzy  Began and after  Pulling activi Pulling chest  And activity .   IMPRESSION: 1. No acute intracranial abnormality. 2. Moderate chronic small vessel ischemic disease. 3. Numerous chronic cerebral microhemorrhages in a pattern which could reflect amyloid angiopathy.   Electronically Signed   By: Logan Bores M.D.   On: 12/26/2019 19:45  IMPRESSION: Moderate chronic microvascular ischemic change in the white matter. No acute intracranial abnormality.  No significant carotid or vertebral artery stenosis in the neck  No intracranial stenosis or large vessel occlusion. Negative for vascular malformation  Left thyroid nodule 2.5 x 3.5 cm. Probable additional 3 cm left lower lobe nodule. Postop right thyroidectomy. Recommend thyroid ultrasound to rule out neoplasm. (ref: J Am Coll Radiol. 2015 Feb;12(2): 143-50).   Electronically Signed   By: Franchot Gallo M.D.   On: 03/01/2020 09:12 ROS: See pertinent positives and negatives per HPI.  Past Medical History:  Diagnosis Date  . Adenomatous colon polyp   . Anxiety   . Cataract 2013   bilat removed   . DJD (degenerative joint disease) of lumbar spine   . Estrogen deficiency   . Gastritis   . GERD (gastroesophageal reflux disease)   . Headache(784.0)   .  Helicobacter pylori infection 10/13/2007   Qualifier: Diagnosis of  By: Marland Mcalpine    . History of positive PPD 10/16/2011  . HSV-2 infection   . Hyperglycemia   . Hyperlipidemia    no meds now  . Hypertension   . IBS (irritable bowel syndrome)   . Medication side effect - Propofol (burning vein) and prvastatin (myalgia) 04/08/2013   Pravastatin patient went off and rechallenged and symptoms recurred muscle cramps /foot cramps   . Neuromuscular disorder (HCC)    muscle cramps  . Pulsatile tinnitus    had MRA and MPV nl mild carotid dopplers2010  . Shingles 02/09/2011   Right upper face and eyelid but not involving the eye at this point.   . Sleep apnea    does not use c pap  . Thyroid disease   . Vulvitis     Family History  Problem Relation Age of Onset  . Alzheimer's disease Mother   . Heart attack Father        38  . Asthma Sister   . Colon cancer Sister   . Heart attack Brother        43  . Colon polyps Sister   . Colon cancer Sister   . Colon cancer Paternal Aunt   . Thyroid disease Sister        multinodular goiter  . Stomach cancer Neg Hx   . Breast  cancer Neg Hx   . Rectal cancer Neg Hx     Social History   Socioeconomic History  . Marital status: Single    Spouse name: widowed.   . Number of children: Y  . Years of education: Not on file  . Highest education level: Not on file  Occupational History  . Occupation: retired.    Comment: [employer]----now retired.    Employer: RETIRED  Tobacco Use  . Smoking status: Former Smoker    Packs/day: 0.50    Years: 23.00    Pack years: 11.50    Types: Cigarettes    Quit date: 06/16/1984    Years since quitting: 35.8  . Smokeless tobacco: Never Used  Vaping Use  . Vaping Use: Never used  Substance and Sexual Activity  . Alcohol use: No  . Drug use: No  . Sexual activity: Not Currently    Partners: Male    Birth control/protection: Surgical  Other Topics Concern  . Not on file  Social History Narrative     Retired    former smoker quit 1985   Widowed fall 2010 from myeloma. Lives alone.       Right handed   Social Determinants of Health   Financial Resource Strain: Low Risk   . Difficulty of Paying Living Expenses: Not hard at all  Food Insecurity: No Food Insecurity  . Worried About Charity fundraiser in the Last Year: Never true  . Ran Out of Food in the Last Year: Never true  Transportation Needs: No Transportation Needs  . Lack of Transportation (Medical): No  . Lack of Transportation (Non-Medical): No  Physical Activity: Insufficiently Active  . Days of Exercise per Week: 7 days  . Minutes of Exercise per Session: 20 min  Stress: No Stress Concern Present  . Feeling of Stress : Only a little  Social Connections: Moderately Integrated  . Frequency of Communication with Friends and Family: More than three times a week  . Frequency of Social Gatherings with Friends and Family: Once a week  . Attends Religious Services: More than 4 times per year  . Active Member of Clubs or Organizations: Yes  . Attends Archivist Meetings: More than 4 times per year  . Marital Status: Widowed    Outpatient Medications Prior to Visit  Medication Sig Dispense Refill  . acetaminophen (TYLENOL) 500 MG tablet Take 500 mg by mouth every 6 (six) hours as needed for mild pain, moderate pain or headache. Only taking 2 pills per week    . famotidine (PEPCID) 20 MG tablet Take 1 tablet (20 mg total) by mouth 2 (two) times daily. 180 tablet 1  . fluticasone (FLONASE) 50 MCG/ACT nasal spray Place 2 sprays into both nostrils as needed for allergies. FOR RHINITIS 16 mL 5  . SYNTHROID 100 MCG tablet TAKE 1 TABLET BY MOUTH EVERY DAY 90 tablet 1  . triamterene-hydrochlorothiazide (MAXZIDE-25) 37.5-25 MG tablet TAKE 1 TABLET BY MOUTH EVERY DAY 90 tablet 1  . acyclovir (ZOVIRAX) 400 MG tablet Take 400 mg by mouth 2 (two) times daily as needed.  (Patient not taking: Reported on 03/20/2020)    .  Polyethyl Glycol-Propyl Glycol (SYSTANE) 0.4-0.3 % SOLN Systane (PF) 0.4 %-0.3 % eye drops in a dropperette    . ibuprofen (ADVIL) 200 MG tablet Take 200 mg by mouth every 6 (six) hours as needed for mild pain or moderate pain.  (Patient not taking: Reported on 03/28/2020)     No facility-administered  medications prior to visit.     EXAM:  BP (!) 140/74   Pulse 74   Temp 98.8 F (37.1 C) (Oral)   Ht 5\' 5"  (1.651 m)   Wt 197 lb 12.8 oz (89.7 kg)   SpO2 98%   BMI 32.92 kg/m   Body mass index is 32.92 kg/m.  GENERAL: vitals reviewed and listed above, alert, oriented, appears well hydrated and in no acute distress HEENT: atraumatic, conjunctiva  clear, no obvious abnormalities on inspection of external nose and ears OP : masked  NECK: no obvious masses on inspection palpation  LUNGS: clear to auscultation bilaterally, no wheezes, rales or rhonchi, good air movement CV: HRRR, no clubbing cyanosis or  peripheral edema nl cap refill  MS: moves all extremities without noticeable focal  abnormality PSYCH: pleasant and cooperative, no obvious depression or anxiety Lab Results  Component Value Date   WBC 5.4 04/20/2019   HGB 12.9 04/20/2019   HCT 38.7 04/20/2019   PLT 201.0 04/20/2019   GLUCOSE 95 11/15/2019   CHOL 218 (H) 11/15/2019   TRIG 125.0 11/15/2019   HDL 55.90 11/15/2019   LDLDIRECT 131.1 08/09/2009   LDLCALC 137 (H) 11/15/2019   ALT 11 11/15/2019   AST 20 11/15/2019   NA 137 11/15/2019   K 4.4 11/15/2019   CL 99 11/15/2019   CREATININE 0.88 11/15/2019   BUN 21 11/15/2019   CO2 28 11/15/2019   TSH 0.82 11/15/2019   INR 0.9 04/14/2007   HGBA1C 5.9 11/15/2019   BP Readings from Last 3 Encounters:  03/20/20 138/74  03/07/20 (!) 140/74  02/10/20 (!) 143/70    ASSESSMENT AND PLAN:  Discussed the following assessment and plan:  Thyroid nodule - Plan: Ambulatory referral to Endocrinology  Goiter - Plan: Ambulatory referral to Endocrinology  S/P partial  thyroidectomy - Plan: Ambulatory referral to Endocrinology  -Patient advised to return or notify health care team  if  new concerns arise.  Patient Instructions   Will do referral to  Endocrinology about the thyroid  Nodules.   consdieration  Of another cholesterol medication optional  Possible zetia  If not  Tolerating statins.   Make sure  Blood pressure is in a good range .  Below 140/90      Lab Results  Component Value Date   WBC 5.4 04/20/2019   HGB 12.9 04/20/2019   HCT 38.7 04/20/2019   PLT 201.0 04/20/2019   GLUCOSE 95 11/15/2019   CHOL 218 (H) 11/15/2019   TRIG 125.0 11/15/2019   HDL 55.90 11/15/2019   LDLDIRECT 131.1 08/09/2009   LDLCALC 137 (H) 11/15/2019   ALT 11 11/15/2019   AST 20 11/15/2019   NA 137 11/15/2019   K 4.4 11/15/2019   CL 99 11/15/2019   CREATININE 0.88 11/15/2019   BUN 21 11/15/2019   CO2 28 11/15/2019   TSH 0.82 11/15/2019   INR 0.9 04/14/2007   HGBA1C 5.9 11/15/2019        Nik Gorrell K. Phoebe Marter M.D.

## 2020-03-07 ENCOUNTER — Ambulatory Visit (INDEPENDENT_AMBULATORY_CARE_PROVIDER_SITE_OTHER): Payer: Medicare Other | Admitting: Internal Medicine

## 2020-03-07 ENCOUNTER — Other Ambulatory Visit: Payer: Self-pay

## 2020-03-07 ENCOUNTER — Telehealth (HOSPITAL_COMMUNITY): Payer: Self-pay | Admitting: Pulmonary Disease

## 2020-03-07 ENCOUNTER — Encounter: Payer: Self-pay | Admitting: Internal Medicine

## 2020-03-07 VITALS — BP 140/74 | HR 74 | Temp 98.8°F | Ht 65.0 in | Wt 197.8 lb

## 2020-03-07 DIAGNOSIS — G4733 Obstructive sleep apnea (adult) (pediatric): Secondary | ICD-10-CM | POA: Diagnosis not present

## 2020-03-07 DIAGNOSIS — R519 Headache, unspecified: Secondary | ICD-10-CM | POA: Diagnosis not present

## 2020-03-07 DIAGNOSIS — E049 Nontoxic goiter, unspecified: Secondary | ICD-10-CM

## 2020-03-07 DIAGNOSIS — E890 Postprocedural hypothyroidism: Secondary | ICD-10-CM

## 2020-03-07 DIAGNOSIS — R42 Dizziness and giddiness: Secondary | ICD-10-CM | POA: Diagnosis not present

## 2020-03-07 DIAGNOSIS — E041 Nontoxic single thyroid nodule: Secondary | ICD-10-CM | POA: Diagnosis not present

## 2020-03-07 DIAGNOSIS — Z9889 Other specified postprocedural states: Secondary | ICD-10-CM | POA: Diagnosis not present

## 2020-03-07 NOTE — Telephone Encounter (Signed)
Called and spoke with [facility].  Dr Judson Roch results and recommendations given. Patient stated understanding. Patient requested to see Dr Ander Slade or NP to discuss her sleep study in detail, cpap, and options. Patient scheduled with Beth, NP 03/20/20, at 1130.

## 2020-03-07 NOTE — Telephone Encounter (Signed)
Called and left message for patient to return call. Please set up 3 month follow up.

## 2020-03-07 NOTE — Telephone Encounter (Signed)
Pt called back about this, please return call. No [place] schedule for 3 months out.

## 2020-03-07 NOTE — Telephone Encounter (Signed)
Call patient  Sleep study result  Date of study: 03/01/2020  Impression: Moderate obstructive sleep apnea  Recommendation: Recommend CPAP therapy for moderate obstructive sleep apnea Auto titrating CPAP with pressure settings of 5-15 will be appropriate

## 2020-03-07 NOTE — Patient Instructions (Addendum)
Will do referral to  Endocrinology about the thyroid  Nodules.   consdieration  Of another cholesterol medication optional  Possible zetia  If not  Tolerating statins.   Make sure  Blood pressure is in a good range .  Below 140/90      Lab Results  Component Value Date   WBC 5.4 04/20/2019   HGB 12.9 04/20/2019   HCT 38.7 04/20/2019   PLT 201.0 04/20/2019   GLUCOSE 95 11/15/2019   CHOL 218 (H) 11/15/2019   TRIG 125.0 11/15/2019   HDL 55.90 11/15/2019   LDLDIRECT 131.1 08/09/2009   LDLCALC 137 (H) 11/15/2019   ALT 11 11/15/2019   AST 20 11/15/2019   NA 137 11/15/2019   K 4.4 11/15/2019   CL 99 11/15/2019   CREATININE 0.88 11/15/2019   BUN 21 11/15/2019   CO2 28 11/15/2019   TSH 0.82 11/15/2019   INR 0.9 04/14/2007   HGBA1C 5.9 11/15/2019

## 2020-03-13 ENCOUNTER — Ambulatory Visit: Payer: Medicare Other | Admitting: Neurology

## 2020-03-16 ENCOUNTER — Telehealth: Payer: Self-pay | Admitting: Internal Medicine

## 2020-03-16 ENCOUNTER — Telehealth: Payer: Self-pay | Admitting: Neurology

## 2020-03-16 NOTE — Telephone Encounter (Signed)
Patient called in stating she did a home sleep study and those results are not back. Dr. Tomi Likens told her not to take any blood thinners, but she pulled a muscle in her chest. She would like to know if she could take Advil for her pulled muscle?

## 2020-03-16 NOTE — Telephone Encounter (Signed)
Pls let her know the sleep study showed moderate sleep apnea and CPAP was recommended. If she is agreeable, pls order CPAP titration study. Also, pls let her know she needs to see a sleep specialist for the sleep apnea, pls send referral to Pulmonary. She cannot take Advil according to Dr. Georgie Chard note.

## 2020-03-16 NOTE — Telephone Encounter (Signed)
Pt stated she has not heard from anyone from Endocrinology for her thyroid and was told if she didn't hear anything to inform her PCP

## 2020-03-16 NOTE — Telephone Encounter (Signed)
LMOVM for pt to call us back.

## 2020-03-16 NOTE — Telephone Encounter (Signed)
Called patient and gave her the referral information that we have and gave her the number to call Dr. Almetta Lovely office to schedule her appointment. Patient verbalized an understanding.

## 2020-03-20 ENCOUNTER — Encounter: Payer: Self-pay | Admitting: Primary Care

## 2020-03-20 ENCOUNTER — Ambulatory Visit (INDEPENDENT_AMBULATORY_CARE_PROVIDER_SITE_OTHER): Payer: Medicare Other | Admitting: Primary Care

## 2020-03-20 ENCOUNTER — Other Ambulatory Visit: Payer: Self-pay

## 2020-03-20 DIAGNOSIS — G4733 Obstructive sleep apnea (adult) (pediatric): Secondary | ICD-10-CM | POA: Diagnosis not present

## 2020-03-20 NOTE — Patient Instructions (Addendum)
Pleasure meeting you today [name] [name]  Results: Home sleep study on 03/01/20 showed moderate OSA with AHI18.3/hr.  Recommendations: Start CPAP therapy for sleep apnea  Goal wear CPAP for minimum 4-6 hours each night Do not drive if experiencing excessive daytime fatigue or somnolence   Orders: DME referral new CPAP start, pressure auto titrate 5-15cm h20, mask of choice, supplies, humidification, enroll in airview   Follow-up: 6-8 weeks for CPAP compliance    Living With Sleep Apnea Sleep apnea is a condition in which breathing pauses or becomes shallow during sleep. Sleep apnea is most commonly caused by a collapsed or blocked airway. People with sleep apnea snore loudly and have times when they gasp and stop breathing for 10 seconds or more during sleep. This happens over and over during the night. This disrupts your sleep and keeps your body from getting the rest that it needs, which can cause tiredness and lack of energy (fatigue) during the day. The breaks in breathing also interrupt the deep sleep that you need to feel rested. Even if you do not completely wake up from the gaps in breathing, your sleep may not be restful. You may also have a headache in the morning and low energy during the day, and you may feel anxious or depressed. How can sleep apnea affect me? Sleep apnea increases your chances of extreme tiredness during the day (daytime fatigue). It can also increase your risk for health conditions, such as:  Heart attack.  Stroke.  Diabetes.  Heart failure.  Irregular heartbeat.  High blood pressure. If you have daytime fatigue as a result of sleep apnea, you may be more likely to:  Perform poorly at school or work.  Fall asleep while driving.  Have difficulty with attention.  Develop depression or anxiety.  Become severely overweight (obese).  Have sexual dysfunction. What actions can I take to manage sleep apnea? Sleep apnea treatment   If you were  given a device to open your airway while you sleep, use it only as told by your health care provider. You may be given: ? An oral appliance. This is a custom-made mouthpiece that shifts your lower jaw forward. ? A continuous positive airway pressure (CPAP) device. This device blows air through a mask when you breathe out (exhale). ? A nasal expiratory positive airway pressure (EPAP) device. This device has valves that you put into each nostril. ? A bi-level positive airway pressure (BPAP) device. This device blows air through a mask when you breathe in (inhale) and breathe out (exhale).  You may need surgery if other treatments do not work for you. Sleep habits  Go to sleep and wake up at the same time every day. This helps set your internal clock (circadian rhythm) for sleeping. ? If you stay up later than usual, such as on weekends, try to get up in the morning within 2 hours of your normal wake time.  Try to get at least 7-9 hours of sleep each night.  Stop computer, tablet, and mobile phone use a few hours before bedtime.  Do not take long naps during the day. If you nap, limit it to 30 minutes.  Have a relaxing bedtime routine. Reading or listening to music may relax you and help you sleep.  Use your bedroom only for sleep. ? Keep your television and computer out of your bedroom. ? Keep your bedroom cool, dark, and quiet. ? Use a supportive mattress and pillows.  Follow your health care provider's instructions  for other changes to sleep habits. Nutrition  Do not eat heavy meals in the evening.  Do not have caffeine in the later part of the day. The effects of caffeine can last for more than 5 hours.  Follow your health care provider's or dietitian's instructions for any diet changes. Lifestyle      Do not drink alcohol before bedtime. Alcohol can cause you to fall asleep at first, but then it can cause you to wake up in the middle of the night and have trouble getting back  to sleep.  Do not use any products that contain nicotine or tobacco, such as cigarettes and e-cigarettes. If you need help quitting, ask your health care provider. Medicines  Take over-the-counter and prescription medicines only as told by your health care provider.  Do not use over-the-counter sleep medicine. You can become dependent on this medicine, and it can make sleep apnea worse.  Do not use medicines, such as sedatives and narcotics, unless told by your health care provider. Activity  Exercise on most days, but avoid exercising in the evening. Exercising near bedtime can interfere with sleeping.  If possible, spend time outside every day. Natural light helps regulate your circadian rhythm. General information  Lose weight if you need to, and maintain a healthy weight.  Keep all follow-up visits as told by your health care provider. This is important.  If you are having surgery, make sure to tell your health care provider that you have sleep apnea. You may need to bring your device with you. Where to find more information Learn more about sleep apnea and daytime fatigue from:  American Sleep Association: sleepassociation.[place]: sleepfoundation.org  National Heart, Lung, and Blood Institute: https://www.[place].biz/ Summary  Sleep apnea can cause daytime fatigue and other serious health conditions.  Both sleep apnea and daytime fatigue can be bad for your health and well-being.  You may need to wear a device while sleeping to help keep your airway open.  If you are having surgery, make sure to tell your health care provider that you have sleep apnea. You may need to bring your device with you.  Making changes to sleep habits, diet, lifestyle, and activity can help you manage sleep apnea. This information is not intended to replace advice given to you by your health care provider. Make sure you discuss any questions you have with your health care  provider. Document Revised: 11/20/2018 Document Reviewed: 10/23/2017 Elsevier Patient Education  [place].     CPAP and BPAP Information CPAP and BPAP are methods of helping a person breathe with the use of air pressure. CPAP stands for "continuous positive airway pressure." BPAP stands for "bi-level positive airway pressure." In both methods, air is blown through your nose or mouth and into your air passages to help you breathe well. CPAP and BPAP use different amounts of pressure to blow air. With CPAP, the amount of pressure stays the same while you breathe in and out. With BPAP, the amount of pressure is increased when you breathe in (inhale) so that you can take larger breaths. Your health care provider will recommend whether CPAP or BPAP would be more helpful for you. Why are CPAP and BPAP treatments used? CPAP or BPAP can be helpful if you have:  Sleep apnea.  Chronic obstructive pulmonary disease (COPD).  Heart failure.  Medical conditions that weaken the muscles of the chest including muscular dystrophy, or neurological diseases such as amyotrophic lateral sclerosis (ALS).  Other problems that cause breathing to be weak, abnormal, or difficult. CPAP is most commonly used for obstructive sleep apnea (OSA) to keep the airways from collapsing when the muscles relax during sleep. How is CPAP or BPAP administered? Both CPAP and BPAP are provided by a small machine with a flexible plastic tube that attaches to a plastic mask. You wear the mask. Air is blown through the mask into your nose or mouth. The amount of pressure that is used to blow the air can be adjusted on the machine. Your health care provider will determine the pressure setting that should be used based on your individual needs. When should CPAP or BPAP be used? In most cases, the mask only needs to be worn during sleep. Generally, the mask needs to be worn throughout the night and during any daytime naps. People  with certain medical conditions may also need to wear the mask at other times when they are awake. Follow instructions from your health care provider about when to use the machine. What are some tips for using the mask?   Because the mask needs to be snug, some people feel trapped or closed-in (claustrophobic) when first using the mask. If you feel this way, you may need to get used to the mask. One way to do this is by holding the mask loosely over your nose or mouth and then gradually applying the mask more snugly. You can also gradually increase the amount of time that you use the mask.  Masks are available in various types and sizes. Some fit over your mouth and nose while others fit over just your nose. If your mask does not fit well, talk with your health care provider about getting a different one.  If you are using a mask that fits over your nose and you tend to breathe through your mouth, a chin strap may be applied to help keep your mouth closed.  The CPAP and BPAP machines have alarms that may sound if the mask comes off or develops a leak.  If you have trouble with the mask, it is very important that you talk with your health care provider about finding a way to make the mask easier to tolerate. Do not stop using the mask. Stopping the use of the mask could have a negative impact on your health. What are some tips for using the machine?  Place your CPAP or BPAP machine on a secure table or stand near an electrical outlet.  Know where the on/off switch is located on the machine.  Follow instructions from your health care provider about how to set the pressure on your machine and when you should use it.  Do not eat or drink while the CPAP or BPAP machine is on. Food or fluids could get pushed into your lungs by the pressure of the CPAP or BPAP.  Do not smoke. Tobacco smoke residue can damage the machine.  For home use, CPAP and BPAP machines can be rented or purchased through home  health care companies. Many different brands of machines are available. Renting a machine before purchasing may help you find out which particular machine works well for you.  Keep the CPAP or BPAP machine and attachments clean. Ask your health care provider for specific instructions. Get help right away if:  You have redness or open areas around your nose or mouth where the mask fits.  You have trouble using the CPAP or BPAP machine.  You cannot tolerate wearing  the CPAP or BPAP mask.  You have pain, discomfort, and bloating in your abdomen. Summary  CPAP and BPAP are methods of helping a person breathe with the use of air pressure.  Both CPAP and BPAP are provided by a small machine with a flexible plastic tube that attaches to a plastic mask.  If you have trouble with the mask, it is very important that you talk with your health care provider about finding a way to make the mask easier to tolerate. This information is not intended to replace advice given to you by your health care provider. Make sure you discuss any questions you have with your health care provider. Document Revised: 11/18/2018 Document Reviewed: 06/17/2016 Elsevier Patient Education  [place].

## 2020-03-20 NOTE — Progress Notes (Signed)
@Patient  ID: [name] [name], female    DOB: Jan 01, 1941, 79 y.o.   MRN: [id number]  Chief Complaint  Patient presents with  . Follow-up    Referring provider: Burnis Medin, MD  HPI: 79 year old female.  Past medical history significant for daytime sleepiness, headaches, obstructive sleep apnea.  Patient of Dr. Ander Slade, seen for initial consult on 01/18/2020 (former patient of Dr. Gwenette Greet last seen in 2014). Sleep study in 2013 showed mild OSA. Ordered for repeat HST. [place] 11/24.   03/20/2020 Patient presents today for 33-month follow-up. Repeat home sleep study on 03/01/20 showed moderate OSA with AHI18.3/hr. SpO2 low 83% RA. She reports waking up short of breath, snoring and sleep distruption. She has been sleeping with her head of bed elevating. She recently had one episode where she reports waking up short of breath. She has been elevating her head at night with pillows. She has trouble maintaining . She mostly lays on right side. She reports loud snoring and waking up multiple times.    Allergies  Allergen Reactions  . Lisinopril     REACTION: COUGH  . Lyrica [Pregabalin] Other (See Comments)    Cns side efffects  . Pravastatin Other (See Comments)    Cramps.  . Simvastatin     Myalgia and stiffness  . Codeine Phosphate     REACTION: itching  . Oxycodone Hcl     REACTION: rash  . Strawberry Extract     Immunization History  Administered Date(s) Administered  . Fluad Quad(high Dose 65+) 04/20/2019  . Influenza Split 06/20/2011, 06/23/2012  . Influenza Whole 05/17/2008, 06/11/2010  . Influenza, High Dose Seasonal PF 05/18/2015, 09/05/2016, 06/05/2017, 04/14/2018  . Influenza,inj,Quad PF,6+ Mos 04/08/2013, 05/25/2014  . Influenza-Unspecified 05/12/2017  . PFIZER SARS-COV-2 Vaccination 10/07/2019, 11/02/2019  . Pneumococcal Conjugate-13 11/05/2013  . Pneumococcal Polysaccharide-23 02/05/2008  . Td 08/12/2004  . Tdap 06/23/2012  . Zoster 03/28/2011    Past Medical  History:  Diagnosis Date  . Adenomatous colon polyp   . Anxiety   . Cataract 2013   bilat removed   . DJD (degenerative joint disease) of lumbar spine   . Estrogen deficiency   . Gastritis   . GERD (gastroesophageal reflux disease)   . Headache(784.0)   . Helicobacter pylori infection 10/13/2007   Qualifier: Diagnosis of  By: Marland Mcalpine    . History of positive PPD 10/16/2011  . HSV-2 infection   . Hyperglycemia   . Hyperlipidemia    no meds now  . Hypertension   . IBS (irritable bowel syndrome)   . Medication side effect - Propofol (burning vein) and prvastatin (myalgia) 04/08/2013   Pravastatin patient went off and rechallenged and symptoms recurred muscle cramps /foot cramps   . Neuromuscular disorder (HCC)    muscle cramps  . Pulsatile tinnitus    had MRA and MPV nl mild carotid dopplers2010  . Shingles 02/09/2011   Right upper face and eyelid but not involving the eye at this point.   . Sleep apnea    does not use c pap  . Thyroid disease   . Vulvitis     Tobacco History: Social History   Tobacco Use  Smoking Status Former Smoker  . Packs/day: 0.50  . Years: 23.00  . Pack years: 11.50  . Types: Cigarettes  . Quit date: 06/16/1984  . Years since quitting: 35.7  Smokeless Tobacco Never Used   Counseling given: Not Answered   Outpatient Medications Prior to Visit  Medication Sig  Dispense Refill  . acetaminophen (TYLENOL) 500 MG tablet Take 500 mg by mouth every 6 (six) hours as needed for mild pain, moderate pain or headache. Only taking 2 pills per week    . famotidine (PEPCID) 20 MG tablet Take 1 tablet (20 mg total) by mouth 2 (two) times daily. 180 tablet 1  . fluticasone (FLONASE) 50 MCG/ACT nasal spray Place 2 sprays into both nostrils as needed for allergies. FOR RHINITIS 16 mL 5  . ibuprofen (ADVIL) 200 MG tablet Take 200 mg by mouth every 6 (six) hours as needed for mild pain or moderate pain.     Vladimir Faster Glycol-Propyl Glycol (SYSTANE) 0.4-0.3  % SOLN Systane (PF) 0.4 %-0.3 % eye drops in a dropperette    . SYNTHROID 100 MCG tablet TAKE 1 TABLET BY MOUTH EVERY DAY 90 tablet 1  . triamterene-hydrochlorothiazide (MAXZIDE-25) 37.5-25 MG tablet TAKE 1 TABLET BY MOUTH EVERY DAY 90 tablet 1  . acyclovir (ZOVIRAX) 400 MG tablet Take 400 mg by mouth 2 (two) times daily as needed.  (Patient not taking: Reported on 03/20/2020)     No facility-administered medications prior to visit.    Review of Systems  Review of Systems  Respiratory: Negative.   Cardiovascular: Negative.   Psychiatric/Behavioral: Positive for sleep disturbance.    Physical Exam  BP 138/74 (BP Location: Right Arm, Cuff Size: Normal)   Pulse 61   Temp (!) 97.1 F (36.2 C)   Ht 5\' 5"  (1.651 m)   Wt 202 lb (91.6 kg)   SpO2 97%   BMI 33.61 kg/m  Physical Exam Constitutional:      Appearance: Normal appearance. She is not ill-appearing.  HENT:     Mouth/Throat:     Mouth: Mucous membranes are moist.     Pharynx: Oropharynx is clear.  Cardiovascular:     Rate and Rhythm: Normal rate and regular rhythm.  Pulmonary:     Effort: Pulmonary effort is normal.     Breath sounds: Normal breath sounds.  Neurological:     General: No focal deficit present.     Mental Status: She is alert and oriented to person, place, and time. Mental status is at baseline.  Psychiatric:        Mood and Affect: Mood normal.        Behavior: Behavior normal.        Thought Content: Thought content normal.        Judgment: Judgment normal.      Lab Results:  CBC    Component Value Date/Time   WBC 5.4 04/20/2019 0925   RBC 4.28 04/20/2019 0925   HGB 12.9 04/20/2019 0925   HCT 38.7 04/20/2019 0925   PLT 201.0 04/20/2019 0925   MCV 90.4 04/20/2019 0925   MCH 28.2 11/24/2018 1315   MCHC 33.2 04/20/2019 0925   RDW 13.3 04/20/2019 0925   LYMPHSABS 2.3 04/20/2019 0925   MONOABS 0.4 04/20/2019 0925   EOSABS 0.1 04/20/2019 0925   BASOSABS 0.0 04/20/2019 0925    BMET      Component Value Date/Time   NA 137 11/15/2019 0735   K 4.4 11/15/2019 0735   CL 99 11/15/2019 0735   CO2 28 11/15/2019 0735   GLUCOSE 95 11/15/2019 0735   BUN 21 11/15/2019 0735   CREATININE 0.88 11/15/2019 0735   CALCIUM 9.5 11/15/2019 0735   GFRNONAA >60 11/24/2018 1315   GFRAA >60 11/24/2018 1315    BNP No results found for: BNP  ProBNP No  results found for: PROBNP  Imaging: CT ANGIO HEAD W OR WO CONTRAST  Result Date: 03/01/2020 CLINICAL DATA:  Headache and dizziness. Microhemorrhages. Frequent falls. Creatinine was obtained on site at [place] at [address] Results: Creatinine 0.9 mg/dL. EXAM: CT ANGIOGRAPHY HEAD AND NECK TECHNIQUE: Multidetector CT imaging of the head and neck was performed using the standard protocol during bolus administration of intravenous contrast. Multiplanar CT image reconstructions and MIPs were obtained to evaluate the vascular anatomy. Carotid stenosis measurements (when applicable) are obtained utilizing NASCET criteria, using the distal internal carotid diameter as the denominator. CONTRAST:  36mL ISOVUE-370 IOPAMIDOL (ISOVUE-370) INJECTION 76% COMPARISON:  MRI head 12/25/2019 FINDINGS: CT HEAD FINDINGS Brain: Patchy white matter hypodensity bilaterally consistent with chronic microvascular ischemic change stable from the prior study. Ventricle size normal. Negative for acute infarct, hemorrhage, mass Vascular: Negative for hyperdense vessel Skull: Negative Sinuses: Paranasal sinuses clear. Orbits: Bilateral cataract extraction.  No orbital lesion. Review of the MIP images confirms the above findings CTA NECK FINDINGS Aortic arch: 4 vessel arch. Left vertebral artery origin from the arch. Mild atherosclerotic disease in the aortic arch. Proximal great vessels patent without stenosis. Right carotid system: Normal right carotid without significant atherosclerotic disease or stenosis. Left carotid system: Normal left carotid without  significant atherosclerotic disease or stenosis. Vertebral arteries: Right vertebral dominant. Both vertebral arteries widely patent without stenosis. Skeleton: No acute skeletal abnormality. Other neck: Left thyroid nodule measuring 2.5 x 3.5 cm with heterogeneous enhancement. Heterogeneous enhancement of the inferior pole of the left thyroid which may represent additional nodule measuring up to 3 cm. Right thyroidectomy with surgical clips. Upper chest: Lung apices clear bilaterally. Subpleural 1 cm calcification right upper lobe compatible with granuloma. Review of the MIP images confirms the above findings CTA HEAD FINDINGS Anterior circulation: Cavernous carotid widely patent with minimal calcification. Anterior and middle cerebral arteries patent bilaterally without stenosis. Posterior circulation: Both vertebral arteries patent to the basilar. Small PICA patent bilaterally. Basilar patent. AICA, superior cerebellar, posterior cerebral arteries patent bilaterally. Fetal origin left posterior cerebral artery. Right posterior communicating artery patent. Venous sinuses: Normal venous enhancement Anatomic variants: None Review of the MIP images confirms the above findings IMPRESSION: Moderate chronic microvascular ischemic change in the white matter. No acute intracranial abnormality. No significant carotid or vertebral artery stenosis in the neck No intracranial stenosis or large vessel occlusion. Negative for vascular malformation Left thyroid nodule 2.5 x 3.5 cm. Probable additional 3 cm left lower lobe nodule. Postop right thyroidectomy. Recommend thyroid ultrasound to rule out neoplasm. (ref: J Am Coll Radiol. 2015 Feb;12(2): 143-50). Electronically Signed   By: Franchot Gallo M.D.   On: 03/01/2020 09:12   CT ANGIO NECK W OR WO CONTRAST  Result Date: 03/01/2020 CLINICAL DATA:  Headache and dizziness. Microhemorrhages. Frequent falls. Creatinine was obtained on site at [place] at [address] Results: Creatinine 0.9 mg/dL. EXAM: CT ANGIOGRAPHY HEAD AND NECK TECHNIQUE: Multidetector CT imaging of the head and neck was performed using the standard protocol during bolus administration of intravenous contrast. Multiplanar CT image reconstructions and MIPs were obtained to evaluate the vascular anatomy. Carotid stenosis measurements (when applicable) are obtained utilizing NASCET criteria, using the distal internal carotid diameter as the denominator. CONTRAST:  57mL ISOVUE-370 IOPAMIDOL (ISOVUE-370) INJECTION 76% COMPARISON:  MRI head 12/25/2019 FINDINGS: CT HEAD FINDINGS Brain: Patchy white matter hypodensity bilaterally consistent with chronic microvascular ischemic change stable from the prior study. Ventricle size normal. Negative for acute infarct,  hemorrhage, mass Vascular: Negative for hyperdense vessel Skull: Negative Sinuses: Paranasal sinuses clear. Orbits: Bilateral cataract extraction.  No orbital lesion. Review of the MIP images confirms the above findings CTA NECK FINDINGS Aortic arch: 4 vessel arch. Left vertebral artery origin from the arch. Mild atherosclerotic disease in the aortic arch. Proximal great vessels patent without stenosis. Right carotid system: Normal right carotid without significant atherosclerotic disease or stenosis. Left carotid system: Normal left carotid without significant atherosclerotic disease or stenosis. Vertebral arteries: Right vertebral dominant. Both vertebral arteries widely patent without stenosis. Skeleton: No acute skeletal abnormality. Other neck: Left thyroid nodule measuring 2.5 x 3.5 cm with heterogeneous enhancement. Heterogeneous enhancement of the inferior pole of the left thyroid which may represent additional nodule measuring up to 3 cm. Right thyroidectomy with surgical clips. Upper chest: Lung apices clear bilaterally. Subpleural 1 cm calcification right upper lobe compatible with granuloma. Review of the MIP images confirms the  above findings CTA HEAD FINDINGS Anterior circulation: Cavernous carotid widely patent with minimal calcification. Anterior and middle cerebral arteries patent bilaterally without stenosis. Posterior circulation: Both vertebral arteries patent to the basilar. Small PICA patent bilaterally. Basilar patent. AICA, superior cerebellar, posterior cerebral arteries patent bilaterally. Fetal origin left posterior cerebral artery. Right posterior communicating artery patent. Venous sinuses: Normal venous enhancement Anatomic variants: None Review of the MIP images confirms the above findings IMPRESSION: Moderate chronic microvascular ischemic change in the white matter. No acute intracranial abnormality. No significant carotid or vertebral artery stenosis in the neck No intracranial stenosis or large vessel occlusion. Negative for vascular malformation Left thyroid nodule 2.5 x 3.5 cm. Probable additional 3 cm left lower lobe nodule. Postop right thyroidectomy. Recommend thyroid ultrasound to rule out neoplasm. (ref: J Am Coll Radiol. 2015 Feb;12(2): 143-50). Electronically Signed   By: Franchot Gallo M.D.   On: 03/01/2020 09:12     Assessment & Plan:   OSA (obstructive sleep apnea) -  She reports symptoms of apnea, snoring and sleep disruption. Home sleep study on 03/01/20 showed moderate OSA with AHI18.3/hr. - Discussed underlying risk factors for untreated sleep apnea and treatment options including weight loss, side sleeping position, CPAP, oral appliance and referral to ENT. Patient would like to start CPAP therapy.  - Placing order for new CPAP start 5-15cm h20 with DME  - Encouraged patient to wear CPAP for minimum 4-6 hours each night and advised her not drive if experiencing excessive daytime fatigue or somnolence   Orders: DME referral new CPAP start, pressure auto titrate 5-15cm h20, mask of choice, supplies, humidification, enroll in airview   Follow-up: 6-8 weeks for CPAP compliance check     Martyn Ehrich, NP 03/20/2020

## 2020-03-20 NOTE — Assessment & Plan Note (Addendum)
-    She reports symptoms of apnea, snoring and sleep disruption. Home sleep study on 03/01/20 showed moderate OSA with AHI18.3/hr. - Discussed underlying risk factors for untreated sleep apnea and treatment options including weight loss, side sleeping position, CPAP, oral appliance and referral to ENT. Patient would like to start CPAP therapy.  - Placing order for new CPAP start 5-15cm h20 with DME  - Encouraged patient to wear CPAP for minimum 4-6 hours each night and advised her not drive if experiencing excessive daytime fatigue or somnolence   Orders: DME referral new CPAP start, pressure auto titrate 5-15cm h20, mask of choice, supplies, humidification, enroll in airview   Follow-up: 6-8 weeks for CPAP compliance check

## 2020-03-20 NOTE — Addendum Note (Signed)
Addended byDocia Barrier on: 03/20/2020 12:12 PM   Modules accepted: Orders

## 2020-03-21 ENCOUNTER — Other Ambulatory Visit: Payer: Self-pay | Admitting: Endocrinology

## 2020-03-21 DIAGNOSIS — I10 Essential (primary) hypertension: Secondary | ICD-10-CM | POA: Diagnosis not present

## 2020-03-21 DIAGNOSIS — E890 Postprocedural hypothyroidism: Secondary | ICD-10-CM | POA: Diagnosis not present

## 2020-03-21 DIAGNOSIS — E041 Nontoxic single thyroid nodule: Secondary | ICD-10-CM | POA: Diagnosis not present

## 2020-03-23 ENCOUNTER — Other Ambulatory Visit: Payer: Self-pay | Admitting: Endocrinology

## 2020-03-23 DIAGNOSIS — E039 Hypothyroidism, unspecified: Secondary | ICD-10-CM

## 2020-03-27 ENCOUNTER — Ambulatory Visit: Payer: Medicare Other

## 2020-03-28 ENCOUNTER — Other Ambulatory Visit: Payer: Self-pay

## 2020-03-28 ENCOUNTER — Telehealth: Payer: Self-pay | Admitting: Neurology

## 2020-03-28 ENCOUNTER — Ambulatory Visit (INDEPENDENT_AMBULATORY_CARE_PROVIDER_SITE_OTHER): Payer: Medicare Other

## 2020-03-28 DIAGNOSIS — Z0000 Encounter for general adult medical examination without abnormal findings: Secondary | ICD-10-CM | POA: Diagnosis not present

## 2020-03-28 NOTE — Progress Notes (Signed)
Subjective:   [name] [name] is a 79 y.o. female who presents for Medicare Annual (Subsequent) preventive examination.  I connected with Ardyth Kelso  today by telephone and verified that I am speaking with the correct person using two identifiers. Location patient: home Location provider: work Persons participating in the virtual visit: patient, provider.   I discussed the limitations, risks, security and privacy concerns of performing an evaluation and management service by telephone and the availability of in person appointments. I also discussed with the patient that there may be a patient responsible charge related to this service. The patient expressed understanding and verbally consented to this telephonic visit.    Interactive audio and video telecommunications were attempted between this provider and patient, however failed, due to patient having technical difficulties OR patient did not have access to video capability.  We continued and completed visit with audio only.      Review of Systems    N/A Cardiac Risk Factors include: dyslipidemia;advanced age (>68men, >29 women);hypertension     Objective:    Today's Vitals   There is no height or weight on file to calculate BMI.  Advanced Directives 03/28/2020 02/10/2020 11/24/2018 07/05/2018 04/14/2018 12/22/2017 10/02/2017  Does Patient Have a Medical Advance Directive? No Yes No No No No No  Type of Advance Directive - Living will - - - - -  Would patient like information on creating a medical advance directive? No - Patient declined - - - - No - Patient declined No - Patient declined    Current Medications (verified) Outpatient Encounter Medications as of 03/28/2020  Medication Sig  . acetaminophen (TYLENOL) 500 MG tablet Take 500 mg by mouth every 6 (six) hours as needed for mild pain, moderate pain or headache. Only taking 2 pills per week  . famotidine (PEPCID) 20 MG tablet Take 1 tablet (20 mg total) by mouth 2  (two) times daily.  . fluticasone (FLONASE) 50 MCG/ACT nasal spray Place 2 sprays into both nostrils as needed for allergies. FOR RHINITIS  . Polyethyl Glycol-Propyl Glycol (SYSTANE) 0.4-0.3 % SOLN Systane (PF) 0.4 %-0.3 % eye drops in a dropperette  . SYNTHROID 100 MCG tablet TAKE 1 TABLET BY MOUTH EVERY DAY  . triamterene-hydrochlorothiazide (MAXZIDE-25) 37.5-25 MG tablet TAKE 1 TABLET BY MOUTH EVERY DAY  . acyclovir (ZOVIRAX) 400 MG tablet Take 400 mg by mouth 2 (two) times daily as needed.  (Patient not taking: Reported on 03/20/2020)  . [DISCONTINUED] ibuprofen (ADVIL) 200 MG tablet Take 200 mg by mouth every 6 (six) hours as needed for mild pain or moderate pain.  (Patient not taking: Reported on 03/28/2020)   No facility-administered encounter medications on file as of 03/28/2020.    Allergies (verified) Lisinopril, Lyrica [pregabalin], Pravastatin, Simvastatin, Codeine phosphate, Oxycodone hcl, and Strawberry extract   History: Past Medical History:  Diagnosis Date  . Adenomatous colon polyp   . Anxiety   . Cataract 2013   bilat removed   . DJD (degenerative joint disease) of lumbar spine   . Estrogen deficiency   . Gastritis   . GERD (gastroesophageal reflux disease)   . Headache(784.0)   . Helicobacter pylori infection 10/13/2007   Qualifier: Diagnosis of  By: Marland Mcalpine    . History of positive PPD 10/16/2011  . HSV-2 infection   . Hyperglycemia   . Hyperlipidemia    no meds now  . Hypertension   . IBS (irritable bowel syndrome)   . Medication side effect - Propofol (burning  vein) and prvastatin (myalgia) 04/08/2013   Pravastatin patient went off and rechallenged and symptoms recurred muscle cramps /foot cramps   . Neuromuscular disorder (HCC)    muscle cramps  . Pulsatile tinnitus    had MRA and MPV nl mild carotid dopplers2010  . Shingles 02/09/2011   Right upper face and eyelid but not involving the eye at this point.   . Sleep apnea    does not use c pap  .  Thyroid disease   . Vulvitis    Past Surgical History:  Procedure Laterality Date  . ABDOMINAL HYSTERECTOMY  1982   fibroids  . COLONOSCOPY  2003- 2102   adenomatous polyps  . FINGER SURGERY     left middle A1pulley release  . isthmuectomy     thyroid surgery  . LUMBAR SPINE SURGERY  05/1989   spine d/t tumor neurofibroma Quentin Cornwall  . POLYPECTOMY    . SHOULDER ARTHROSCOPY Bilateral    left, right 2017  . THYROIDECTOMY     partial rt  ballen  . TONSILLECTOMY     at age 33.   Marland Kitchen TOTAL HIP ARTHROPLASTY Right 10/02/2017   Procedure: RIGHT TOTAL HIP ARTHROPLASTY ANTERIOR APPROACH;  Surgeon: Rod Can, MD;  Location: [facility];  Service: Orthopedics;  Laterality: Right;  Needs RNFA  . TUBAL LIGATION  1974   Family History  Problem Relation Age of Onset  . Alzheimer's disease Mother   . Heart attack Father        63  . Asthma Sister   . Colon cancer Sister   . Heart attack Brother        55  . Colon polyps Sister   . Colon cancer Sister   . Colon cancer Paternal Aunt   . Thyroid disease Sister        multinodular goiter  . Stomach cancer Neg Hx   . Breast cancer Neg Hx   . Rectal cancer Neg Hx    Social History   Socioeconomic History  . Marital status: Single    Spouse name: widowed.   . Number of children: Y  . Years of education: Not on file  . Highest education level: Not on file  Occupational History  . Occupation: retired.    Comment: [employer]----now retired.    Employer: RETIRED  Tobacco Use  . Smoking status: Former Smoker    Packs/day: 0.50    Years: 23.00    Pack years: 11.50    Types: Cigarettes    Quit date: 06/16/1984    Years since quitting: 35.8  . Smokeless tobacco: Never Used  Vaping Use  . Vaping Use: Never used  Substance and Sexual Activity  . Alcohol use: No  . Drug use: No  . Sexual activity: Not Currently    Partners: Male    Birth control/protection: Surgical  Other Topics Concern  . Not on file  Social History Narrative   Retired     former smoker quit 1985   Widowed fall 2010 from myeloma. Lives alone.       Right handed   Social Determinants of Health   Financial Resource Strain: Low Risk   . Difficulty of Paying Living Expenses: Not hard at all  Food Insecurity: No Food Insecurity  . Worried About Charity fundraiser in the Last Year: Never true  . Ran Out of Food in the Last Year: Never true  Transportation Needs: No Transportation Needs  . Lack of Transportation (Medical): No  . Lack  of Transportation (Non-Medical): No  Physical Activity: Insufficiently Active  . Days of Exercise per Week: 7 days  . Minutes of Exercise per Session: 20 min  Stress: No Stress Concern Present  . Feeling of Stress : Only a little  Social Connections: Moderately Integrated  . Frequency of Communication with Friends and Family: More than three times a week  . Frequency of Social Gatherings with Friends and Family: Once a week  . Attends Religious Services: More than 4 times per year  . Active Member of Clubs or Organizations: Yes  . Attends Archivist Meetings: More than 4 times per year  . Marital Status: Widowed    Tobacco Counseling Counseling given: Not Answered   Clinical Intake:  Pre-visit preparation completed: Yes  Pain : No/denies pain     Nutritional Risks: None Diabetes: No  How often do you need to have someone help you when you read instructions, pamphlets, or other written materials from your doctor or pharmacy?: 1 - Never What is the last grade level you completed in school?: GED  Diabetic?No  Interpreter Needed?: No  Information entered by :: [place] of Daily Living In your present state of health, do you have any difficulty performing the following activities: 03/28/2020  Hearing? N  Vision? N  Difficulty concentrating or making decisions? N  Walking or climbing stairs? N  Dressing or bathing? N  Doing errands, shopping? N  Preparing Food and eating ? N    Using the Toilet? N  In the past six months, have you accidently leaked urine? N  Do you have problems with loss of bowel control? N  Managing your Medications? N  Managing your Finances? N  Housekeeping or managing your Housekeeping? N  Some recent data might be hidden    Patient Care Team: Panosh, Standley Brooking, MD as PCP - General Minus Breeding, MD as PCP - Cardiology (Cardiology) Jerrell Belfast, MD Eldred Manges, MD (Inactive) (Obstetrics and Gynecology) Gatha Mayer, MD (Gastroenterology) Justice Britain, MD as Consulting Physician (Orthopedic Surgery) Monna Fam, MD as Consulting Physician (Ophthalmology) Earnie Larsson, [facility] as Pharmacist (Pharmacist)  Indicate any recent Medical Services you may have received from other than [facility] providers in the past year (date may be approximate).     Assessment:   This is a routine wellness examination for [place].  Hearing/Vision screen  Hearing Screening   125Hz  250Hz  500Hz  1000Hz  2000Hz  3000Hz  4000Hz  6000Hz  8000Hz   Right ear:           Left ear:           Vision Screening Comments: Patient gets eyes examined yearly    Dietary issues and exercise activities discussed: Current Exercise Habits: Home exercise routine, Type of exercise: walking, Time (Minutes): 20, Frequency (Times/Week): 7, Weekly Exercise (Minutes/Week): 140, Intensity: Mild, Exercise limited by: orthopedic condition(s)  Goals    . Exercise 150 min/wk Moderate Activity     [facility] in [facility]    . patient     To eat healthier and [facility]     . patient     Set your realistic goals in the am; chip away at them. Review at hs/  Goal setting without judgement;  No "should have"   Lower expectations  Take time to rest    . Patient Stated     I would [facility] to be able [facility]     . Pharmacy Care Plan     CARE PLAN ENTRY (see longitudinal  plan of care for additional care plan information)  Current Barriers:  . Chronic Disease Management support, education, and care coordination needs related to Hypertension, Hyperlipidemia, Hypothyroidism, and Hyperglycemia   Hypertension BP Readings from Last 3 Encounters:  02/10/20 (!) 143/70  01/18/20 130/80  12/01/19 134/86   . Pharmacist Clinical Goal(s): o Over the next 30 days, patient will work with PharmD and providers to maintain BP goal <130/80 . Current regimen:  o Triamterene- hydrochlorothiazide 37.5- 25mg , 1 tablet once daily . Interventions: o Recommend:  .  diet modifications. DASH diet:  following a diet emphasizing fruits and vegetables and low-fat dairy products along with whole grains, fish, poultry, and nuts. Reducing red meats and sugars.  . Exercising . Reducing the amount of salt intake to 1500mg /per day.  . Recommend using a salt substitute to replace your salt if you need flavor.    . Weight reduction- We discussed losing 5-10% of body weight . Patient self care activities - Over the next 30 days, patient will: o Check BP as directed, document, and provide at future appointments o Ensure daily salt intake < 2300 mg/day  Hyperlipidemia Lab Results  Component Value Date/Time   LDLCALC 137 (H) 11/15/2019 07:35 AM   LDLDIRECT 131.1 08/09/2009 09:29 AM   . Pharmacist Clinical Goal(s): o Over the next 30 days, patient will work with PharmD and providers to achieve LDL goal < 100 . Current regimen:  o No medications . Interventions: o Recommend trying ezetimibe (Zetia) 10mg , 1 tablet once daily . Patient self care activities - Over the next 30 days, patient will: o Continue current medications as directed by provider.   Hyperglycemia  Lab Results  Component Value Date/Time   HGBA1C 5.9 11/15/2019 07:35 AM   HGBA1C 5.4 08/03/2019 02:28 PM   HGBA1C 6.0 04/14/2018 10:33 AM   . Pharmacist Clinical Goal(s): o Over the next 90 days, patient will work  with PharmD and providers to maintain A1c goal <6.5% . Current regimen:  o No medications . Interventions: o Recommend modifying lifestyle, including to participate in moderate physical activity (e.g., walking) at least 150 minutes per week. Discussed a Mediterranean eating plan with an emphasis on whole grains, legumes, nuts, fruits, and vegetables and minimal refined and processed foods.  . Patient self care activities - Over the next 30 days, patient will: o Continue to work on improving lifestyle (diet and exercise).   Hypothyroidism TSH  Date Value Ref Range Status  11/15/2019 0.82 0.35 - 4.50 uIU/mL Final .  Pharmacist Clinical Goal(s) o Over the next 90 days, patient will work with PharmD and providers to maintain TSH: 0.35 to 4.50 uIU/mL . Current regimen:  o Synthroid 168mcg, 1 tablet once daily  . Interventions: o We discussed:  levothyroxine administration in regards to food and other medications (take at least 30 minutes before first meal)  . Patient self care activities - Over the next 90 days, patient will: o Continue current medications.   Medication management . Pharmacist Clinical Goal(s): o Over the next 90 days, patient will work with PharmD and providers to maintain optimal medication adherence . Current pharmacy: [facility] . Interventions o Comprehensive medication review performed. o Continue current medication management strategy . Patient self care activities - Over the next 90 days, patient will: o Take medications as prescribed o Report any questions or  concerns to PharmD and/or provider(s)  Initial goal documentation       Depression Screen PHQ 2/9 Scores 03/28/2020 04/20/2019 04/14/2018 04/14/2018 04/02/2017 03/19/2017 01/25/2015  PHQ - 2 Score 0 0 0 0 1 0 0  PHQ- 9 Score 0 - - - - - -  Exception Documentation - - - - Patient refusal - -    Fall Risk Fall Risk  03/28/2020 02/10/2020 04/20/2019 04/14/2018 04/14/2018  Falls in the past year? 1 1 1  (No Data) Yes  Comment -  - - had one emptying trash can, hurt right shoulder but is better now -  Number falls in past yr: 1 0 1 - 1  Injury with Fall? 0 1 0 - Yes  Comment - - - - shoulder pain following fall - tripped over trash can lid falling to the ground on right shoulder.  Risk for fall due to : History of fall(s);Medication side effect;Impaired balance/gait - History of fall(s) - -  Follow up Falls evaluation completed;Falls prevention discussed - Falls evaluation completed - -    Any stairs in or around the home? Yes  If so, are there any without handrails? No  Home free of loose throw rugs in walkways, pet beds, electrical cords, etc? Yes  Adequate lighting in your home to reduce risk of falls? Yes   ASSISTIVE DEVICES UTILIZED TO PREVENT FALLS:  Life alert? No  Use of a cane, walker or w/c? No  Grab bars in the bathroom? Yes  Shower chair or bench in shower? No  Elevated toilet seat or a handicapped toilet? Yes    Cognitive Function: MMSE - Mini Mental State Exam 04/14/2018 04/14/2018  Not completed: (No Data) (No Data)     6CIT Screen 03/28/2020  What Year? 0 points  What month? 0 points  What time? 0 points  Count back from 20 0 points  Months in reverse 0 points  Repeat phrase 0 points  Total Score 0    Immunizations Immunization History  Administered Date(s) Administered  . Fluad Quad(high Dose 65+) 04/20/2019  . Influenza Split 06/20/2011, 06/23/2012  . Influenza Whole 05/17/2008, 06/11/2010  . Influenza, High Dose Seasonal PF 05/18/2015, 09/05/2016, 06/05/2017, 04/14/2018  . Influenza,inj,Quad PF,6+ Mos 04/08/2013, 05/25/2014  . Influenza-Unspecified 05/12/2017  . PFIZER SARS-COV-2 Vaccination 10/07/2019, 11/02/2019  . Pneumococcal Conjugate-13 11/05/2013  . Pneumococcal Polysaccharide-23 02/05/2008  . Td 08/12/2004  . Tdap 06/23/2012  . Zoster 03/28/2011    TDAP status: Up to date Flu Vaccine status: Up to date Pneumococcal vaccine status: Up to date Covid-19 vaccine  status: Completed vaccines  Qualifies for Shingles Vaccine? Yes   Zostavax completed Yes   Shingrix Completed?: No.    Education has been provided regarding the importance of this vaccine. Patient has been advised to call insurance company to determine out of pocket expense if they have not yet received this vaccine. Advised may also receive vaccine at local pharmacy or Health Dept. Verbalized acceptance and understanding.  Screening Tests Health Maintenance  Topic Date Due  . Hepatitis C Screening  Never done  . INFLUENZA VACCINE  03/12/2020  . COLONOSCOPY  12/22/2020  . TETANUS/TDAP  06/23/2022  . DEXA SCAN  Completed  . COVID-19 Vaccine  Completed  . PNA vac Low Risk Adult  Completed    Health Maintenance  Health Maintenance Due  Topic Date Due  . Hepatitis C Screening  Never done  . INFLUENZA VACCINE  03/12/2020    Colorectal cancer screening: Completed 12/22/2017. Repeat every  5 years Mammogram status: Completed 07/20/2019. Repeat every year Bone Density status: Completed 06/30/2012. Results reflect: Bone density results: NORMAL. Repeat every 0 years.  Lung Cancer Screening: (Low Dose CT Chest recommended if Age 37-80 years, 30 pack-year currently smoking OR have quit w/in 15years.) does not qualify.   Lung Cancer Screening Referral: N/A  Additional Screening:  Hepatitis C Screening: does not qualify;   Vision Screening: Recommended annual ophthalmology exams for early detection of glaucoma and other disorders of the eye. Is the patient up to date with their annual eye exam?  Yes  Who is the provider or what is the name of the office in which the patient attends annual eye exams? Dr. Herbert Deaner If pt is not established with a provider, would they like to be referred to a provider to establish care? No .   Dental Screening: Recommended annual dental exams for proper oral hygiene  Community Resource Referral / Chronic Care Management: CRR required this visit?  No   CCM  required this visit?  No      Plan:     I have personally reviewed and noted the following in the patient's chart:   . Medical and social history . Use of alcohol, tobacco or illicit drugs  . Current medications and supplements . Functional ability and status . Nutritional status . Physical activity . Advanced directives . List of other physicians . Hospitalizations, surgeries, and ER visits in previous 12 months . Vitals . Screenings to include cognitive, depression, and falls . Referrals and appointments  In addition, I have reviewed and discussed with patient certain preventive protocols, quality metrics, and best practice recommendations. A written personalized care plan for preventive services as well as general preventive health recommendations were provided to patient.     Ofilia Neas, LPN   [id number]/10/[id number]   Nurse Notes: None

## 2020-03-28 NOTE — Telephone Encounter (Signed)
Patient called in stating she had called in early August and just recently noticed she had missed a call from [place] asking for her to call back in. Sheena had left a voicemail on 03/16/20

## 2020-03-28 NOTE — Patient Instructions (Signed)
[name] [name] , Thank you for taking time to come for your Medicare Wellness Visit. I appreciate your ongoing commitment to your health goals. Please review the following plan we discussed and let me know if I can assist you in the future.   Screening recommendations/referrals: Colonoscopy: Up to date, next due 12/23/2022 Mammogram: Up to date, next due 07/19/2020 Bone Density: No longer required Recommended yearly ophthalmology/optometry visit for glaucoma screening and checkup Recommended yearly dental visit for hygiene and checkup  Vaccinations: Influenza vaccine: Up to date, next due fall 2021 Pneumococcal vaccine: Completed series  Tdap vaccine: Up to date, next due 06/23/2022 Shingles vaccine: Currently due, please contact your insurance or pharmacy to discuss cost. You may receive this vaccine at the pharmacy.    Advanced directives: Advance directive discussed with you today. Even though you declined this today please call our office should you change your mind and we can give you the proper paperwork for you to fill out.   Conditions/risks identified: None   Next appointment: 05/17/2020 @ 10:30 am with Dr. Regis Bill   Preventive Care 65 Years and Older, Female Preventive care refers to lifestyle choices and visits with your health care provider that can promote health and wellness. What does preventive care include?  A yearly physical exam. This is also called an annual well check.  Dental exams once or twice a year.  Routine eye exams. Ask your health care provider how often you should have your eyes checked.  Personal lifestyle choices, including:  Daily care of your teeth and gums.  Regular physical activity.  Eating a healthy diet.  Avoiding tobacco and drug use.  Limiting alcohol use.  Practicing safe sex.  Taking low-dose aspirin every day.  Taking vitamin and mineral supplements as recommended by your health care provider. What happens during an annual  well check? The services and screenings done by your health care provider during your annual well check will depend on your age, overall health, lifestyle risk factors, and family history of disease. Counseling  Your health care provider may ask you questions about your:  Alcohol use.  Tobacco use.  Drug use.  Emotional well-being.  Home and relationship well-being.  Sexual activity.  Eating habits.  History of falls.  Memory and ability to understand (cognition).  Work and work Statistician.  Reproductive health. Screening  You may have the following tests or measurements:  Height, weight, and BMI.  Blood pressure.  Lipid and cholesterol levels. These may be checked every 5 years, or more frequently if you are over 4 years old.  Skin check.  Lung cancer screening. You may have this screening every year starting at age 39 if you have a 30-pack-year history of smoking and currently smoke or have quit within the past 15 years.  Fecal occult blood test (FOBT) of the stool. You may have this test every year starting at age 64.  Flexible sigmoidoscopy or colonoscopy. You may have a sigmoidoscopy every 5 years or a colonoscopy every 10 years starting at age 52.  Hepatitis C blood test.  Hepatitis B blood test.  Sexually transmitted disease (STD) testing.  Diabetes screening. This is done by checking your blood sugar (glucose) after you have not eaten for a while (fasting). You may have this done every 1-3 years.  Bone density scan. This is done to screen for osteoporosis. You may have this done starting at age 52.  Mammogram. This may be done every 1-2 years. Talk to your health care  provider about how often you should have regular mammograms. Talk with your health care provider about your test results, treatment options, and if necessary, the need for more tests. Vaccines  Your health care provider may recommend certain vaccines, such as:  Influenza vaccine. This  is recommended every year.  Tetanus, diphtheria, and acellular pertussis (Tdap, Td) vaccine. You may need a Td booster every 10 years.  Zoster vaccine. You may need this after age 35.  Pneumococcal 13-valent conjugate (PCV13) vaccine. One dose is recommended after age 31.  Pneumococcal polysaccharide (PPSV23) vaccine. One dose is recommended after age 60. Talk to your health care provider about which screenings and vaccines you need and how often you need them. This information is not intended to replace advice given to you by your health care provider. Make sure you discuss any questions you have with your health care provider. Document Released: 08/25/2015 Document Revised: 04/17/2016 Document Reviewed: 05/30/2015 Elsevier Interactive Patient Education  2017 [employer] Prevention in the Home Falls can cause injuries. They can happen to people of all ages. There are many things you can do to make your home safe and to help prevent falls. What can I do on the outside of my home?  Regularly fix the edges of walkways and driveways and fix any cracks.  Remove anything that might make you trip as you walk through a door, such as a raised step or threshold.  Trim any bushes or trees on the path to your home.  Use bright outdoor lighting.  Clear any walking paths of anything that might make someone trip, such as rocks or tools.  Regularly check to see if handrails are loose or broken. Make sure that both sides of any steps have handrails.  Any raised decks and porches should have guardrails on the edges.  Have any leaves, snow, or ice cleared regularly.  Use sand or salt on walking paths during winter.  Clean up any spills in your garage right away. This includes oil or grease spills. What can I do in the bathroom?  Use night lights.  Install grab bars by the toilet and in the tub and shower. Do not use towel bars as grab bars.  Use non-skid mats or decals in the tub or  shower.  If you need to sit down in the shower, use a plastic, non-slip stool.  Keep the floor dry. Clean up any water that spills on the floor as soon as it happens.  Remove soap buildup in the tub or shower regularly.  Attach bath mats securely with double-sided non-slip rug tape.  Do not have throw rugs and other things on the floor that can make you trip. What can I do in the bedroom?  Use night lights.  Make sure that you have a light by your bed that is easy to reach.  Do not use any sheets or blankets that are too big for your bed. They should not hang down onto the floor.  Have a firm chair that has side arms. You can use this for support while you get dressed.  Do not have throw rugs and other things on the floor that can make you trip. What can I do in the kitchen?  Clean up any spills right away.  Avoid walking on wet floors.  Keep items that you use a lot in easy-to-reach places.  If you need to reach something above you, use a strong step stool that has a grab bar.  Keep electrical cords out of the way.  Do not use floor polish or wax that makes floors slippery. If you must use wax, use non-skid floor wax.  Do not have throw rugs and other things on the floor that can make you trip. What can I do with my stairs?  Do not leave any items on the stairs.  Make sure that there are handrails on both sides of the stairs and use them. Fix handrails that are broken or loose. Make sure that handrails are as long as the stairways.  Check any carpeting to make sure that it is firmly attached to the stairs. Fix any carpet that is loose or worn.  Avoid having throw rugs at the top or bottom of the stairs. If you do have throw rugs, attach them to the floor with carpet tape.  Make sure that you have a light switch at the top of the stairs and the bottom of the stairs. If you do not have them, ask someone to add them for you. What else can I do to help prevent  falls?  Wear shoes that:  Do not have high heels.  Have rubber bottoms.  Are comfortable and fit you well.  Are closed at the toe. Do not wear sandals.  If you use a stepladder:  Make sure that it is fully opened. Do not climb a closed stepladder.  Make sure that both sides of the stepladder are locked into place.  Ask someone to hold it for you, if possible.  Clearly mark and make sure that you can see:  Any grab bars or handrails.  First and last steps.  Where the edge of each step is.  Use tools that help you move around (mobility aids) if they are needed. These include:  Canes.  Walkers.  Scooters.  Crutches.  Turn on the lights when you go into a dark area. Replace any light bulbs as soon as they burn out.  Set up your furniture so you have a clear path. Avoid moving your furniture around.  If any of your floors are uneven, fix them.  If there are any pets around you, be aware of where they are.  Review your medicines with your doctor. Some medicines can make you feel dizzy. This can increase your chance of falling. Ask your doctor what other things that you can do to help prevent falls. This information is not intended to replace advice given to you by your health care provider. Make sure you discuss any questions you have with your health care provider. Document Released: 05/25/2009 Document Revised: 01/04/2016 Document Reviewed: 09/02/2014 Elsevier Interactive Patient Education  2017 [employer].

## 2020-03-29 ENCOUNTER — Ambulatory Visit
Admission: RE | Admit: 2020-03-29 | Discharge: 2020-03-29 | Disposition: A | Discharge status: Home / Self Care | Payer: Medicare Other | Source: Ambulatory Visit | Attending: Endocrinology | Admitting: Endocrinology

## 2020-03-29 DIAGNOSIS — E042 Nontoxic multinodular goiter: Secondary | ICD-10-CM | POA: Diagnosis not present

## 2020-03-29 DIAGNOSIS — E039 Hypothyroidism, unspecified: Secondary | ICD-10-CM

## 2020-03-29 NOTE — Telephone Encounter (Signed)
LMOVM for pt to call us back. Pt can not take Aspirin per DR. Jaffe. Pt should f/u with Pulmonary about further recommendation for Sleep Apnea

## 2020-03-30 ENCOUNTER — Telehealth: Payer: Self-pay

## 2020-03-30 NOTE — Telephone Encounter (Signed)
Pt advised of her Sleep Study and not to take Advil.   Pt states the CPAP is in the works right now. She is waiting in her insurance.

## 2020-03-31 ENCOUNTER — Telehealth: Payer: Medicare Other

## 2020-04-06 DIAGNOSIS — M545 Low back pain: Secondary | ICD-10-CM | POA: Diagnosis not present

## 2020-04-06 DIAGNOSIS — Z6833 Body mass index (BMI) 33.0-33.9, adult: Secondary | ICD-10-CM | POA: Diagnosis not present

## 2020-04-06 DIAGNOSIS — N952 Postmenopausal atrophic vaginitis: Secondary | ICD-10-CM | POA: Diagnosis not present

## 2020-04-06 DIAGNOSIS — R8281 Pyuria: Secondary | ICD-10-CM | POA: Diagnosis not present

## 2020-04-06 DIAGNOSIS — Z01419 Encounter for gynecological examination (general) (routine) without abnormal findings: Secondary | ICD-10-CM | POA: Diagnosis not present

## 2020-04-06 LAB — HM PAP SMEAR

## 2020-04-19 ENCOUNTER — Other Ambulatory Visit: Payer: Self-pay

## 2020-04-28 ENCOUNTER — Telehealth: Payer: Self-pay | Admitting: Primary Care

## 2020-04-28 NOTE — Telephone Encounter (Signed)
ATC patient.  LM to return call 05/01/20 at 0800. Phones at office are now off until Monday 05/01/20.

## 2020-05-01 NOTE — Telephone Encounter (Signed)
If pt is receiving her cpap machine 05/15/20, pt's appt does need to be rescheduled until after then.  Insurance requires [place] to be able to see compliance of cpap having data showing 31-90 days for compliance report.  Pt needs to have OV scheduled 6-8 weeks from 05/15/20.  Attempted to call pt but unable to reach. Left her a message stating that we do need to reschedule appt for date after she receives cpap machine. Stated to pt to call office so we can get appt rescheduled.

## 2020-05-01 NOTE — Telephone Encounter (Signed)
Patient is returning phone call. Patient phone number is [phone].

## 2020-05-02 NOTE — Telephone Encounter (Signed)
lmtcb for pt.   Pt needs to be using her CPAP the day she receives it. She will need to have at least 31 days of data on the machine. If pt would like to reschedule to a later date to be safe she can. She needs to have an office visit 31-90 days of having her cpap.

## 2020-05-03 NOTE — Telephone Encounter (Signed)
Pt's appt has been rescheduled 06/19/20. Nothing further needed.

## 2020-05-04 ENCOUNTER — Ambulatory Visit: Payer: Medicare Other | Admitting: Primary Care

## 2020-05-08 ENCOUNTER — Telehealth: Payer: Self-pay | Admitting: Internal Medicine

## 2020-05-08 NOTE — Telephone Encounter (Signed)
Patient is wanting to know if she can get the booster shot and when and where she can get the booster at.

## 2020-05-08 NOTE — Telephone Encounter (Signed)
Please see message.  Please advise. 

## 2020-05-12 DIAGNOSIS — Z23 Encounter for immunization: Secondary | ICD-10-CM | POA: Diagnosis not present

## 2020-05-15 NOTE — Telephone Encounter (Signed)
Check [facility] web site for her  I think can make appt that way

## 2020-05-15 NOTE — Telephone Encounter (Signed)
Called patient and LMOVM to return call  Left a detailed voice message with message from Dr. Regis Bill for patient.

## 2020-05-16 NOTE — Progress Notes (Signed)
Chief Complaint  Patient presents with  . Follow-up    Doing okay    HPI: [name] [name] 79 y.o. come in for Chronic disease management   F/U   Bp :seems ok taking meds without problem  didnt take med yet this am   Dizzy types still seem to be  A few hours after eating  At times  Poss from  Cereals and carbs     Mri head microhemorrhages   No acute findings  Not yet having neuro fu.  Not taking  Cleaster Corin has kid head in past   On cabinets at home but no loc concussions   Thyroid  eval   Nodules benign appearing  And should be due   OSA  Delay in equipment  Not yet using fully  But planning on this   Asks about tumeric cumin supplement for joint pain   ROS: See pertinent positives and negatives per HPI.  Got flu shot and covid booster  recently   No se except . Mild headache   Past Medical History:  Diagnosis Date  . Adenomatous colon polyp   . Anxiety   . Cataract 2013   bilat removed   . DJD (degenerative joint disease) of lumbar spine   . Estrogen deficiency   . Gastritis   . GERD (gastroesophageal reflux disease)   . Headache(784.0)   . Helicobacter pylori infection 10/13/2007   Qualifier: Diagnosis of  By: Marland Mcalpine    . History of positive PPD 10/16/2011  . HSV-2 infection   . Hyperglycemia   . Hyperlipidemia    no meds now  . Hypertension   . IBS (irritable bowel syndrome)   . Medication side effect - Propofol (burning vein) and prvastatin (myalgia) 04/08/2013   Pravastatin patient went off and rechallenged and symptoms recurred muscle cramps /foot cramps   . Neuromuscular disorder (HCC)    muscle cramps  . Pulsatile tinnitus    had MRA and MPV nl mild carotid dopplers2010  . Shingles 02/09/2011   Right upper face and eyelid but not involving the eye at this point.   . Sleep apnea    does not use c pap  . Thyroid disease   . Vulvitis     Family History  Problem Relation Age of Onset  . Alzheimer's disease Mother   . Heart attack Father         64  . Asthma Sister   . Colon cancer Sister   . Heart attack Brother        38  . Colon polyps Sister   . Colon cancer Sister   . Colon cancer Paternal Aunt   . Thyroid disease Sister        multinodular goiter  . Stomach cancer Neg Hx   . Breast cancer Neg Hx   . Rectal cancer Neg Hx     Social History   Socioeconomic History  . Marital status: Single    Spouse name: widowed.   . Number of children: Y  . Years of education: Not on file  . Highest education level: Not on file  Occupational History  . Occupation: retired.    Comment: [employer]----now retired.    Employer: RETIRED  Tobacco Use  . Smoking status: Former Smoker    Packs/day: 0.50    Years: 23.00    Pack years: 11.50    Types: Cigarettes    Quit date: 06/16/1984    Years since quitting:  35.9  . Smokeless tobacco: Never Used  Vaping Use  . Vaping Use: Never used  Substance and Sexual Activity  . Alcohol use: No  . Drug use: No  . Sexual activity: Not Currently    Partners: Male    Birth control/protection: Surgical  Other Topics Concern  . Not on file  Social History Narrative   Retired    former smoker quit 1985   Widowed fall 2010 from myeloma. Lives alone.       Right handed   Social Determinants of Health   Financial Resource Strain: Low Risk   . Difficulty of Paying Living Expenses: Not hard at all  Food Insecurity: No Food Insecurity  . Worried About Charity fundraiser in the Last Year: Never true  . Ran Out of Food in the Last Year: Never true  Transportation Needs: No Transportation Needs  . Lack of Transportation (Medical): No  . Lack of Transportation (Non-Medical): No  Physical Activity: Insufficiently Active  . Days of Exercise per Week: 7 days  . Minutes of Exercise per Session: 20 min  Stress: No Stress Concern Present  . Feeling of Stress : Only a little  Social Connections: Moderately Integrated  . Frequency of Communication with Friends and Family: More than three  times a week  . Frequency of Social Gatherings with Friends and Family: Once a week  . Attends Religious Services: More than 4 times per year  . Active Member of Clubs or Organizations: Yes  . Attends Archivist Meetings: More than 4 times per year  . Marital Status: Widowed    Outpatient Medications Prior to Visit  Medication Sig Dispense Refill  . acetaminophen (TYLENOL) 500 MG tablet Take 500 mg by mouth every 6 (six) hours as needed for mild pain, moderate pain or headache. Only taking 2 pills per week    . acyclovir (ZOVIRAX) 400 MG tablet Take 400 mg by mouth 2 (two) times daily as needed.     . Estradiol (VAGIFEM) 10 MCG TABS vaginal tablet Vagifem 10 mcg vaginal tablet  INSERT 1 TABLET(S) IN VAGINA TWICE A WEEK    . famotidine (PEPCID) 20 MG tablet Take 1 tablet (20 mg total) by mouth 2 (two) times daily. 180 tablet 1  . fluticasone (FLONASE) 50 MCG/ACT nasal spray Place 2 sprays into both nostrils as needed for allergies. FOR RHINITIS 16 mL 5  . Polyethyl Glycol-Propyl Glycol (SYSTANE) 0.4-0.3 % SOLN Systane (PF) 0.4 %-0.3 % eye drops in a dropperette    . SYNTHROID 100 MCG tablet TAKE 1 TABLET BY MOUTH EVERY DAY 90 tablet 1  . triamterene-hydrochlorothiazide (MAXZIDE-25) 37.5-25 MG tablet TAKE 1 TABLET BY MOUTH EVERY DAY 90 tablet 1   No facility-administered medications prior to visit.     EXAM:  BP (!) 148/78   Pulse (!) 52   Temp 98.7 F (37.1 C) (Oral)   Ht 5\' 5"  (1.651 m)   Wt 201 lb 9.6 oz (91.4 kg)   SpO2 95%   BMI 33.55 kg/m   Body mass index is 33.55 kg/m.  GENERAL: vitals reviewed and listed above, alert, oriented, appears well hydrated and in no acute distress HEENT: atraumatic, conjunctiva  clear, no obvious abnormalities on inspection of external nose and ears OP : masked  NECK: no obvious masses on inspection palpation  LUNGS: clear to auscultation bilaterally, no wheezes, rales or rhonchi, good air movement CV: HRRR, no clubbing cyanosis  or  peripheral edema nl cap refill  MS: moves all extremities without noticeable focal  abnormality PSYCH: pleasant and cooperative, no obvious depression or anxiety Lab Results  Component Value Date   WBC 5.4 04/20/2019   HGB 12.9 04/20/2019   HCT 38.7 04/20/2019   PLT 201.0 04/20/2019   GLUCOSE 95 11/15/2019   CHOL 218 (H) 11/15/2019   TRIG 125.0 11/15/2019   HDL 55.90 11/15/2019   LDLDIRECT 131.1 08/09/2009   LDLCALC 137 (H) 11/15/2019   ALT 11 11/15/2019   AST 20 11/15/2019   NA 137 11/15/2019   K 4.4 11/15/2019   CL 99 11/15/2019   CREATININE 0.88 11/15/2019   BUN 21 11/15/2019   CO2 28 11/15/2019   TSH 0.82 11/15/2019   INR 0.9 04/14/2007   HGBA1C 5.9 11/15/2019   BP Readings from Last 3 Encounters:  05/17/20 (!) 148/78  03/20/20 138/74  03/07/20 (!) 140/74  record review   ASSESSMENT AND PLAN:  Discussed the following assessment and plan:  Dizziness  Hyperglycemia  Essential hypertension  Hyperlipidemia, unspecified hyperlipidemia type  S/P partial thyroidectomy  Medication management  OSA (obstructive sleep apnea) - cpap i n works  transition  reviewed all of above .  Disc diet  Avoiding simple cereals and dried  fruits and try protein for am meal and monitor  bp control discussed  If not in range may need to add medication . (  Also consider if diuretic can cause dizziness ?)  But bp control  Close fu    If bp not controlled we can add adjust medication Advise against  Tumeric for now  cause of bleeding risk unless Dr Tomi Likens opines is safe for her situation.  -Patient advised to return or notify health care team  if  new concerns arise. 35 minute  Review and counsel  And plan  Patient Instructions  Stop the  Cereals   And dried fruit since  That can   Up the blood sugar.  and get rebound    In about 2 hours .   Proceed with  sleep apnea . Treatment . As planned  And fu with neurology  as possible.    Bp  control  :  Consider adding medication  to help get to goal.   Goal is 130/80 or below   Perfect 120/80     Standley Brooking. Karsin Pesta M.D.

## 2020-05-17 ENCOUNTER — Other Ambulatory Visit: Payer: Self-pay

## 2020-05-17 ENCOUNTER — Encounter: Payer: Self-pay | Admitting: Internal Medicine

## 2020-05-17 ENCOUNTER — Ambulatory Visit (INDEPENDENT_AMBULATORY_CARE_PROVIDER_SITE_OTHER): Payer: Medicare Other | Admitting: Internal Medicine

## 2020-05-17 VITALS — BP 148/78 | HR 52 | Temp 98.7°F | Ht 65.0 in | Wt 201.6 lb

## 2020-05-17 DIAGNOSIS — E890 Postprocedural hypothyroidism: Secondary | ICD-10-CM

## 2020-05-17 DIAGNOSIS — R42 Dizziness and giddiness: Secondary | ICD-10-CM

## 2020-05-17 DIAGNOSIS — E785 Hyperlipidemia, unspecified: Secondary | ICD-10-CM

## 2020-05-17 DIAGNOSIS — I10 Essential (primary) hypertension: Secondary | ICD-10-CM

## 2020-05-17 DIAGNOSIS — Z79899 Other long term (current) drug therapy: Secondary | ICD-10-CM | POA: Diagnosis not present

## 2020-05-17 DIAGNOSIS — G4733 Obstructive sleep apnea (adult) (pediatric): Secondary | ICD-10-CM | POA: Diagnosis not present

## 2020-05-17 DIAGNOSIS — R739 Hyperglycemia, unspecified: Secondary | ICD-10-CM | POA: Diagnosis not present

## 2020-05-17 NOTE — Patient Instructions (Addendum)
Stop the  Cereals   And dried fruit since  That can   Up the blood sugar.  and get rebound    In about 2 hours .   Proceed with  sleep apnea . Treatment . As planned  And fu with neurology  as possible.    Bp  control  :  Consider adding medication to help get to goal.   Goal is 130/80 or below   Perfect 120/80

## 2020-05-25 DIAGNOSIS — R4189 Other symptoms and signs involving cognitive functions and awareness: Secondary | ICD-10-CM | POA: Diagnosis not present

## 2020-05-25 DIAGNOSIS — H5462 Unqualified visual loss, left eye, normal vision right eye: Secondary | ICD-10-CM | POA: Diagnosis present

## 2020-05-25 DIAGNOSIS — R2689 Other abnormalities of gait and mobility: Secondary | ICD-10-CM | POA: Diagnosis not present

## 2020-05-25 DIAGNOSIS — E538 Deficiency of other specified B group vitamins: Secondary | ICD-10-CM | POA: Diagnosis present

## 2020-05-25 DIAGNOSIS — R4182 Altered mental status, unspecified: Secondary | ICD-10-CM | POA: Diagnosis not present

## 2020-05-25 DIAGNOSIS — I611 Nontraumatic intracerebral hemorrhage in hemisphere, cortical: Secondary | ICD-10-CM | POA: Diagnosis not present

## 2020-05-25 DIAGNOSIS — M5136 Other intervertebral disc degeneration, lumbar region: Secondary | ICD-10-CM | POA: Diagnosis present

## 2020-05-25 DIAGNOSIS — Z7982 Long term (current) use of aspirin: Secondary | ICD-10-CM | POA: Diagnosis not present

## 2020-05-25 DIAGNOSIS — D3610 Benign neoplasm of peripheral nerves and autonomic nervous system, unspecified: Secondary | ICD-10-CM | POA: Diagnosis present

## 2020-05-25 DIAGNOSIS — K589 Irritable bowel syndrome without diarrhea: Secondary | ICD-10-CM | POA: Diagnosis present

## 2020-05-25 DIAGNOSIS — R0902 Hypoxemia: Secondary | ICD-10-CM | POA: Diagnosis not present

## 2020-05-25 DIAGNOSIS — I629 Nontraumatic intracranial hemorrhage, unspecified: Secondary | ICD-10-CM | POA: Diagnosis not present

## 2020-05-25 DIAGNOSIS — I10 Essential (primary) hypertension: Secondary | ICD-10-CM | POA: Diagnosis present

## 2020-05-25 DIAGNOSIS — G936 Cerebral edema: Secondary | ICD-10-CM | POA: Diagnosis not present

## 2020-05-25 DIAGNOSIS — R4701 Aphasia: Secondary | ICD-10-CM | POA: Diagnosis present

## 2020-05-25 DIAGNOSIS — R001 Bradycardia, unspecified: Secondary | ICD-10-CM | POA: Diagnosis not present

## 2020-05-25 DIAGNOSIS — R404 Transient alteration of awareness: Secondary | ICD-10-CM | POA: Diagnosis not present

## 2020-05-25 DIAGNOSIS — I517 Cardiomegaly: Secondary | ICD-10-CM | POA: Diagnosis not present

## 2020-05-25 DIAGNOSIS — Z781 Physical restraint status: Secondary | ICD-10-CM | POA: Diagnosis not present

## 2020-05-25 DIAGNOSIS — I6200 Nontraumatic subdural hemorrhage, unspecified: Secondary | ICD-10-CM | POA: Diagnosis not present

## 2020-05-25 DIAGNOSIS — K219 Gastro-esophageal reflux disease without esophagitis: Secondary | ICD-10-CM | POA: Diagnosis present

## 2020-05-25 DIAGNOSIS — G9340 Encephalopathy, unspecified: Secondary | ICD-10-CM | POA: Diagnosis not present

## 2020-05-25 DIAGNOSIS — H5340 Unspecified visual field defects: Secondary | ICD-10-CM | POA: Diagnosis present

## 2020-05-25 DIAGNOSIS — I609 Nontraumatic subarachnoid hemorrhage, unspecified: Secondary | ICD-10-CM | POA: Diagnosis not present

## 2020-05-25 DIAGNOSIS — E785 Hyperlipidemia, unspecified: Secondary | ICD-10-CM | POA: Diagnosis present

## 2020-05-25 DIAGNOSIS — R55 Syncope and collapse: Secondary | ICD-10-CM | POA: Diagnosis not present

## 2020-05-25 DIAGNOSIS — E854 Organ-limited amyloidosis: Secondary | ICD-10-CM | POA: Diagnosis present

## 2020-05-25 DIAGNOSIS — Z20822 Contact with and (suspected) exposure to covid-19: Secondary | ICD-10-CM | POA: Diagnosis not present

## 2020-05-25 DIAGNOSIS — I680 Cerebral amyloid angiopathy: Secondary | ICD-10-CM | POA: Diagnosis present

## 2020-05-25 DIAGNOSIS — G4489 Other headache syndrome: Secondary | ICD-10-CM | POA: Diagnosis not present

## 2020-05-25 DIAGNOSIS — D72829 Elevated white blood cell count, unspecified: Secondary | ICD-10-CM | POA: Diagnosis not present

## 2020-05-25 DIAGNOSIS — R410 Disorientation, unspecified: Secondary | ICD-10-CM | POA: Diagnosis not present

## 2020-05-25 DIAGNOSIS — I615 Nontraumatic intracerebral hemorrhage, intraventricular: Secondary | ICD-10-CM | POA: Diagnosis present

## 2020-05-25 DIAGNOSIS — R1312 Dysphagia, oropharyngeal phase: Secondary | ICD-10-CM | POA: Diagnosis present

## 2020-05-25 DIAGNOSIS — E039 Hypothyroidism, unspecified: Secondary | ICD-10-CM | POA: Diagnosis not present

## 2020-05-25 DIAGNOSIS — D649 Anemia, unspecified: Secondary | ICD-10-CM | POA: Diagnosis present

## 2020-05-25 DIAGNOSIS — G4733 Obstructive sleep apnea (adult) (pediatric): Secondary | ICD-10-CM | POA: Diagnosis present

## 2020-05-25 DIAGNOSIS — R200 Anesthesia of skin: Secondary | ICD-10-CM | POA: Diagnosis not present

## 2020-05-25 DIAGNOSIS — E890 Postprocedural hypothyroidism: Secondary | ICD-10-CM | POA: Diagnosis present

## 2020-05-25 DIAGNOSIS — E876 Hypokalemia: Secondary | ICD-10-CM | POA: Diagnosis not present

## 2020-05-31 DIAGNOSIS — R9431 Abnormal electrocardiogram [ECG] [EKG]: Secondary | ICD-10-CM | POA: Diagnosis not present

## 2020-05-31 DIAGNOSIS — I10 Essential (primary) hypertension: Secondary | ICD-10-CM | POA: Diagnosis not present

## 2020-05-31 DIAGNOSIS — E785 Hyperlipidemia, unspecified: Secondary | ICD-10-CM | POA: Diagnosis present

## 2020-05-31 DIAGNOSIS — I69220 Aphasia following other nontraumatic intracranial hemorrhage: Secondary | ICD-10-CM | POA: Diagnosis not present

## 2020-05-31 DIAGNOSIS — K589 Irritable bowel syndrome without diarrhea: Secondary | ICD-10-CM | POA: Diagnosis present

## 2020-05-31 DIAGNOSIS — I611 Nontraumatic intracerebral hemorrhage in hemisphere, cortical: Secondary | ICD-10-CM | POA: Diagnosis not present

## 2020-05-31 DIAGNOSIS — I69320 Aphasia following cerebral infarction: Secondary | ICD-10-CM | POA: Diagnosis not present

## 2020-05-31 DIAGNOSIS — I615 Nontraumatic intracerebral hemorrhage, intraventricular: Secondary | ICD-10-CM | POA: Diagnosis not present

## 2020-05-31 DIAGNOSIS — G9340 Encephalopathy, unspecified: Secondary | ICD-10-CM | POA: Diagnosis not present

## 2020-05-31 DIAGNOSIS — R410 Disorientation, unspecified: Secondary | ICD-10-CM | POA: Diagnosis not present

## 2020-05-31 DIAGNOSIS — I639 Cerebral infarction, unspecified: Secondary | ICD-10-CM | POA: Diagnosis not present

## 2020-05-31 DIAGNOSIS — E039 Hypothyroidism, unspecified: Secondary | ICD-10-CM | POA: Diagnosis present

## 2020-05-31 DIAGNOSIS — R6889 Other general symptoms and signs: Secondary | ICD-10-CM | POA: Diagnosis present

## 2020-05-31 DIAGNOSIS — E538 Deficiency of other specified B group vitamins: Secondary | ICD-10-CM | POA: Diagnosis present

## 2020-05-31 DIAGNOSIS — Z7409 Other reduced mobility: Secondary | ICD-10-CM | POA: Diagnosis present

## 2020-05-31 DIAGNOSIS — I69218 Other symptoms and signs involving cognitive functions following other nontraumatic intracranial hemorrhage: Secondary | ICD-10-CM | POA: Diagnosis not present

## 2020-05-31 DIAGNOSIS — I619 Nontraumatic intracerebral hemorrhage, unspecified: Secondary | ICD-10-CM | POA: Diagnosis not present

## 2020-05-31 DIAGNOSIS — I69315 Cognitive social or emotional deficit following cerebral infarction: Secondary | ICD-10-CM | POA: Diagnosis not present

## 2020-06-12 ENCOUNTER — Telehealth: Payer: Self-pay | Admitting: *Deleted

## 2020-06-12 NOTE — Telephone Encounter (Signed)
Patient's son Legrand Como called stating patient is having trouble sleeping more so since her stroke she just recently had. Patient has an upcoming appointment on Wednesday. Legrand Como would like something to be called in to help the patient sleep. Legrand Como states the patient may sleep an hour then right back up for multiple hours.

## 2020-06-12 NOTE — Telephone Encounter (Signed)
Pt has appt Wednesday

## 2020-06-13 NOTE — Telephone Encounter (Signed)
So   I advise her neurology team address this   And or  prescribe    For needed meds for sleep or agitation.    Sedation medications can be  A problem and sometimes dangerous  with any  Acute  Neurologic  Conditions  Can explore  At visit

## 2020-06-14 ENCOUNTER — Ambulatory Visit (INDEPENDENT_AMBULATORY_CARE_PROVIDER_SITE_OTHER): Payer: Medicare Other | Admitting: Internal Medicine

## 2020-06-14 ENCOUNTER — Other Ambulatory Visit: Payer: Self-pay

## 2020-06-14 ENCOUNTER — Encounter: Payer: Self-pay | Admitting: Internal Medicine

## 2020-06-14 VITALS — BP 140/82 | HR 90 | Temp 98.9°F | Ht 65.0 in | Wt 182.0 lb

## 2020-06-14 DIAGNOSIS — R4586 Emotional lability: Secondary | ICD-10-CM

## 2020-06-14 DIAGNOSIS — I619 Nontraumatic intracerebral hemorrhage, unspecified: Secondary | ICD-10-CM

## 2020-06-14 DIAGNOSIS — Z79899 Other long term (current) drug therapy: Secondary | ICD-10-CM | POA: Diagnosis not present

## 2020-06-14 DIAGNOSIS — Z09 Encounter for follow-up examination after completed treatment for conditions other than malignant neoplasm: Secondary | ICD-10-CM | POA: Diagnosis not present

## 2020-06-14 DIAGNOSIS — I10 Essential (primary) hypertension: Secondary | ICD-10-CM | POA: Diagnosis not present

## 2020-06-14 DIAGNOSIS — G479 Sleep disorder, unspecified: Secondary | ICD-10-CM | POA: Diagnosis not present

## 2020-06-14 DIAGNOSIS — G4730 Sleep apnea, unspecified: Secondary | ICD-10-CM | POA: Diagnosis not present

## 2020-06-14 NOTE — Patient Instructions (Addendum)
Plan fu   neurology at tertiary center.  To get opinion about risk of bleeding  In brain.  Neurology specialists.  Bp control is important .  No ASA for now .  Can try  increasing the Seroquel to 25 mg at night  If  needed .  With caution.  Consider reassessment  For the OSA.  When you see  Your  pulm doctor .  Rehab first.

## 2020-06-14 NOTE — Progress Notes (Signed)
Chief Complaint  Patient presents with  . Follow-up    still confused, having some mood swings.      HPI: [name] [name] 79 y.o. come in with son Legrand Como  Has dpr  for [facility]  [facility]    Had confusion and  Numbness other face? admited and mri suspecious for poss  Amyloid angiopathy  Right shoulder . Pain at times  Some headache  Better than in hospital  hosp 10 15 - 10 20  Rehab  Done  Concern about sleep  Mood is  Up and down    Given seroquel 12.5 hx and prn bid if needed for agitation  Sis  Helps BP   in am .   . Some dec appetite .  Vision ok  Right side .  Indifference .? She complains that not right   Sister staying  And   Sons x 2   Helping out.  Getting 24 hour supervision.   Careful with visitors .  Asks about neuro follow up  bp  reached out  Some rehab delayed cause of staffing issues . ROS: See pertinent positives and negatives per HPI. No current cp sob bleeding  Swallowing dig motor difficulties now . Never got to use the cpap for osa has fu next week   Past Medical History:  Diagnosis Date  . Adenomatous colon polyp   . Anxiety   . Cataract 2013   bilat removed   . DJD (degenerative joint disease) of lumbar spine   . Estrogen deficiency   . Gastritis   . GERD (gastroesophageal reflux disease)   . Headache(784.0)   . Helicobacter pylori infection 10/13/2007   Qualifier: Diagnosis of  By: Marland Mcalpine    . History of positive PPD 10/16/2011  . HSV-2 infection   . Hyperglycemia   . Hyperlipidemia    no meds now  . Hypertension   . IBS (irritable bowel syndrome)   . Medication side effect - Propofol (burning vein) and prvastatin (myalgia) 04/08/2013   Pravastatin patient went off and rechallenged and symptoms recurred muscle cramps /foot cramps   . Neuromuscular disorder (HCC)    muscle cramps  . Pulsatile tinnitus    had MRA and MPV nl mild carotid dopplers2010  . Shingles 02/09/2011   Right upper face and  eyelid but not involving the eye at this point.   . Sleep apnea    does not use c pap  . Thyroid disease   . Vulvitis     Family History  Problem Relation Age of Onset  . Alzheimer's disease Mother   . Heart attack Father        7  . Asthma Sister   . Colon cancer Sister   . Heart attack Brother        67  . Colon polyps Sister   . Colon cancer Sister   . Colon cancer Paternal Aunt   . Thyroid disease Sister        multinodular goiter  . Stomach cancer Neg Hx   . Breast cancer Neg Hx   . Rectal cancer Neg Hx     Social History   Socioeconomic History  . Marital status: Single    Spouse name: widowed.   . Number of children: Y  . Years of education: Not on file  . Highest education level: Not on file  Occupational History  . Occupation: retired.    Comment:  [employer]----now retired.    Employer: RETIRED  Tobacco Use  . Smoking status: Former Smoker    Packs/day: 0.50    Years: 23.00    Pack years: 11.50    Types: Cigarettes    Quit date: 06/16/1984    Years since quitting: 36.0  . Smokeless tobacco: Never Used  Vaping Use  . Vaping Use: Never used  Substance and Sexual Activity  . Alcohol use: No  . Drug use: No  . Sexual activity: Not Currently    Partners: Male    Birth control/protection: Surgical  Other Topics Concern  . Not on file  Social History Narrative   Retired    former smoker quit 1985   Widowed fall 2010 from myeloma. Lives alone.       Right handed   Social Determinants of Health   Financial Resource Strain: Low Risk   . Difficulty of Paying Living Expenses: Not hard at all  Food Insecurity: No Food Insecurity  . Worried About Charity fundraiser in the Last Year: Never true  . Ran Out of Food in the Last Year: Never true  Transportation Needs: No Transportation Needs  . Lack of Transportation (Medical): No  . Lack of Transportation (Non-Medical): No  Physical Activity: Insufficiently Active  . Days of Exercise per Week: 7 days  .  Minutes of Exercise per Session: 20 min  Stress: No Stress Concern Present  . Feeling of Stress : Only a little  Social Connections: Moderately Integrated  . Frequency of Communication with Friends and Family: More than three times a week  . Frequency of Social Gatherings with Friends and Family: Once a week  . Attends Religious Services: More than 4 times per year  . Active Member of Clubs or Organizations: Yes  . Attends Archivist Meetings: More than 4 times per year  . Marital Status: Widowed       EXAM:  BP 140/82 (BP Location: Right Arm, Patient Position: Sitting, Cuff Size: Normal)   Pulse 90   Temp 98.9 F (37.2 C) (Oral)   Ht 5\' 5"  (1.651 m)   Wt 182 lb (82.6 kg)   SpO2 99%   BMI 30.29 kg/m   Body mass index is 30.29 kg/m.  GENERAL: vitals reviewed and listed above, alert appears well hydrated and in no acute distress lcear speech but talks aaround and has problem with work finding and   Giving her sones name right away   HEENT: atraumatic, conjunctiva  clear, no obvious abnormalities on inspection of external nose and ears OP : maskeNECK: no obvious masses on inspection palpation  LUNGS: clear to auscultation bilaterally, no wheezes, rales or rhonchi, good air movement CV: HRRR, no clubbing cyanosis or  peripheral edema nl cap refill  MS: moves all extremities without noticeable focal  abnormality PSYCH: pleasant and cooperative, no obvious depression or anxiety Gait facil esome stiffness on arising   Skin nl turgor  Lab Results  Component Value Date   WBC 5.4 04/20/2019   HGB 12.9 04/20/2019   HCT 38.7 04/20/2019   PLT 201.0 04/20/2019   GLUCOSE 95 11/15/2019   CHOL 218 (H) 11/15/2019   TRIG 125.0 11/15/2019   HDL 55.90 11/15/2019   LDLDIRECT 131.1 08/09/2009   LDLCALC 137 (H) 11/15/2019   ALT 11 11/15/2019   AST 20 11/15/2019   NA 137 11/15/2019   K 4.4 11/15/2019   CL 99 11/15/2019   CREATININE 0.88 11/15/2019   BUN 21 11/15/2019  CO2 28  11/15/2019   TSH 0.82 11/15/2019   INR 0.9 04/14/2007   HGBA1C 5.9 11/15/2019   BP Readings from Last 3 Encounters:  06/14/20 140/82  05/17/20 (!) 148/78  03/20/20 138/74  med list not updated    So reviewed   Care every where she is not on hctrx tmp and is on seroquel  1/2 25 mg  1 po hs and bid prn agitation  seh is not on asa  She is on b12 .    Discharge Medications: Discharge Medication List as of 06/08/2020 10:15 AM   START taking these medications  Details  amLODIPine (NORVASC) 10 MG tablet Take 1 tablet (10 mg total) by mouth daily., Starting Fri 06/09/2020, Normal   cyanocobalamin (VITAMIN B12) 1000 MCG tablet Take 1 tablet (1,000 mcg total) by mouth daily., Starting Fri 06/09/2020, Normal   hydroCHLOROthiazide (MICROZIDE) 12.5 mg capsule Take 1 capsule (12.5 mg total) by mouth daily., Starting Fri 06/09/2020, Normal   lidocaine 4 % patch Place 1 patch onto the skin daily as needed. Apply to shoulder for pain., Starting Thu 06/08/2020, OTC   melatonin 3 mg Tab Take 1 tablet (3 mg total) by mouth nightly., Starting Thu 06/08/2020, Documentation Only   QUEtiapine (SEROQUEL) 25 MG tablet Take 0.5 tablets (12.5 mg total) by mouth nightly. May take an additional 0.5 tablet (12.5 mg) twice daily by mouth as needed for agitation., Starting Thu 06/08/2020, Normal   senna-docusate (PERICOLACE, SENOKOT-S) 8.6-50 mg per tablet Take 1 tablet by mouth 2 (two) times daily as needed for Constipation., Starting Thu 06/08/2020, Normal   thiamine (VITAMIN B-1) 100 MG tablet Take 1 tablet (100 mg total) by mouth daily., Starting Fri 06/09/2020, Normal    CONTINUE these medications which have NOT CHANGED  Details  acyclovir (ZOVIRAX) 400 MG tablet 2 (two) times daily as needed. , Starting Fri 10/22/2019, Historical Med   levothyroxine (SYNTHROID) 100 MCG tablet Synthroid 100 mcg tablet, Historical Med   PEG 400-propylene glycol (SYSTANE, PROPYLENE GLYCOL,) 0.4-0.3 % Drop ophthalmic  solution Place 1 drop into both eyes 4 (four) times daily as needed (for dry eyes). , Historical Med   VAGIFEM 10 mcg Tab Place 1 tablet vaginally twice a week., Starting Fri 04/07/2020, Historical Med    STOP taking these medications   triamterene-hydrochlorothiazide (MAXZIDE-25) 37.5-25 mg per tablet      ASSESSMENT AND PLAN:  Discussed the following assessment and plan:  Hemorrhagic stroke ([place]) - poss amyloid angiopathy   Medication management  Sleep apnea, unspecified type  Sleep disturbance  Essential hypertension - reported good control at home in [facility] discharge follow-up  Mood swings Ms Burgeson language speech  wandering is aggravated from baseline and reports  Vision  Issues   Son repors very sever mood issues at times although may be some improved   I advise  Close fu with neurology as well as rehab  For consult to give information about  Preventive measures if any  And more evaluation for etiology of  ICH causes prognosis and  Plan .  -Patient son advised to return or notify health care team  if  new concerns arise. Record review and update and plan   50 minutes   Patient Instructions  Plan fu   neurology at tertiary center.  To get opinion about risk of bleeding  In brain.  Neurology specialists.  Bp control is important .  No ASA for now .  Can try  increasing the Seroquel to  25 mg at night  If  needed .  With caution.  Consider reassessment  For the OSA.  When you see  Your  pulm doctor .  Rehab first.     Standley Brooking. Nicolette Gieske M.D.  Left occipitotemporal parenchymal hemorrhage with mild edema and  mass effect. Minor adjacent subdural extension. No definite  intraventricular extension.   No abnormal vascularity in the region of hemorrhage.   No large vessel occlusion or hemodynamically significant stenosis   These results were called by telephone at the time of interpretation  on 05/25/2020 at 3:40 pm to provider Trellis Moment , who    verbally acknowledged these results.    Electronically Signed   By: Macy Mis M.D.   On: 05/25/2020 15:52 echoh mild lvh  Impression Performed by [facility]  1. Redemonstration of left temporooccipital intraparenchymal hemorrhage that is similar in size and configuration by cross-modality comparison. Consider repeat MRI following as hematoma resolves, with contrast if possible, to reassess for underlying lesion.  2. Scattered intraventricular and subarachnoid susceptibility artifact related to redistribution of blood products. This is superimposed upon a few prior parenchymal microhemorrhages, which are nonspecific but could be seen with amyloid angiopathy.  3. No worsening mass effect. No hydrocephalus.

## 2020-06-15 ENCOUNTER — Emergency Department (HOSPITAL_COMMUNITY): Payer: Medicare Other

## 2020-06-15 ENCOUNTER — Other Ambulatory Visit: Payer: Self-pay

## 2020-06-15 ENCOUNTER — Encounter (HOSPITAL_COMMUNITY): Payer: Self-pay | Admitting: Emergency Medicine

## 2020-06-15 ENCOUNTER — Inpatient Hospital Stay (HOSPITAL_COMMUNITY)
Admission: EM | Admit: 2020-06-15 | Discharge: 2020-06-17 | DRG: 689 | Disposition: A | Discharge status: Home Health | Payer: Medicare Other | Attending: Internal Medicine | Admitting: Internal Medicine

## 2020-06-15 DIAGNOSIS — Z79899 Other long term (current) drug therapy: Secondary | ICD-10-CM

## 2020-06-15 DIAGNOSIS — Z8673 Personal history of transient ischemic attack (TIA), and cerebral infarction without residual deficits: Secondary | ICD-10-CM | POA: Diagnosis not present

## 2020-06-15 DIAGNOSIS — Z20822 Contact with and (suspected) exposure to covid-19: Secondary | ICD-10-CM | POA: Diagnosis present

## 2020-06-15 DIAGNOSIS — G4700 Insomnia, unspecified: Secondary | ICD-10-CM | POA: Diagnosis present

## 2020-06-15 DIAGNOSIS — N3000 Acute cystitis without hematuria: Secondary | ICD-10-CM

## 2020-06-15 DIAGNOSIS — I69219 Unspecified symptoms and signs involving cognitive functions following other nontraumatic intracranial hemorrhage: Secondary | ICD-10-CM | POA: Diagnosis not present

## 2020-06-15 DIAGNOSIS — I69220 Aphasia following other nontraumatic intracranial hemorrhage: Secondary | ICD-10-CM | POA: Diagnosis not present

## 2020-06-15 DIAGNOSIS — G9341 Metabolic encephalopathy: Secondary | ICD-10-CM | POA: Diagnosis present

## 2020-06-15 DIAGNOSIS — R569 Unspecified convulsions: Secondary | ICD-10-CM | POA: Diagnosis present

## 2020-06-15 DIAGNOSIS — N179 Acute kidney failure, unspecified: Secondary | ICD-10-CM | POA: Diagnosis present

## 2020-06-15 DIAGNOSIS — Z8349 Family history of other endocrine, nutritional and metabolic diseases: Secondary | ICD-10-CM | POA: Diagnosis not present

## 2020-06-15 DIAGNOSIS — Z825 Family history of asthma and other chronic lower respiratory diseases: Secondary | ICD-10-CM

## 2020-06-15 DIAGNOSIS — Z820 Family history of epilepsy and other diseases of the nervous system: Secondary | ICD-10-CM | POA: Diagnosis not present

## 2020-06-15 DIAGNOSIS — Z87891 Personal history of nicotine dependence: Secondary | ICD-10-CM

## 2020-06-15 DIAGNOSIS — F32A Depression, unspecified: Secondary | ICD-10-CM | POA: Diagnosis present

## 2020-06-15 DIAGNOSIS — E785 Hyperlipidemia, unspecified: Secondary | ICD-10-CM | POA: Diagnosis present

## 2020-06-15 DIAGNOSIS — I616 Nontraumatic intracerebral hemorrhage, multiple localized: Secondary | ICD-10-CM | POA: Diagnosis not present

## 2020-06-15 DIAGNOSIS — R4182 Altered mental status, unspecified: Secondary | ICD-10-CM

## 2020-06-15 DIAGNOSIS — Z888 Allergy status to other drugs, medicaments and biological substances status: Secondary | ICD-10-CM | POA: Diagnosis not present

## 2020-06-15 DIAGNOSIS — R9431 Abnormal electrocardiogram [ECG] [EKG]: Secondary | ICD-10-CM | POA: Diagnosis not present

## 2020-06-15 DIAGNOSIS — Z8249 Family history of ischemic heart disease and other diseases of the circulatory system: Secondary | ICD-10-CM

## 2020-06-15 DIAGNOSIS — R600 Localized edema: Secondary | ICD-10-CM | POA: Diagnosis not present

## 2020-06-15 DIAGNOSIS — I10 Essential (primary) hypertension: Secondary | ICD-10-CM | POA: Diagnosis not present

## 2020-06-15 DIAGNOSIS — N390 Urinary tract infection, site not specified: Secondary | ICD-10-CM | POA: Diagnosis present

## 2020-06-15 DIAGNOSIS — F419 Anxiety disorder, unspecified: Secondary | ICD-10-CM | POA: Diagnosis present

## 2020-06-15 DIAGNOSIS — Z86010 Personal history of colonic polyps: Secondary | ICD-10-CM

## 2020-06-15 DIAGNOSIS — Z79890 Hormone replacement therapy (postmenopausal): Secondary | ICD-10-CM

## 2020-06-15 DIAGNOSIS — Z96641 Presence of right artificial hip joint: Secondary | ICD-10-CM | POA: Diagnosis present

## 2020-06-15 DIAGNOSIS — Z8371 Family history of colonic polyps: Secondary | ICD-10-CM

## 2020-06-15 DIAGNOSIS — G4733 Obstructive sleep apnea (adult) (pediatric): Secondary | ICD-10-CM | POA: Diagnosis present

## 2020-06-15 DIAGNOSIS — K219 Gastro-esophageal reflux disease without esophagitis: Secondary | ICD-10-CM | POA: Diagnosis present

## 2020-06-15 DIAGNOSIS — Z885 Allergy status to narcotic agent status: Secondary | ICD-10-CM | POA: Diagnosis not present

## 2020-06-15 LAB — COMPREHENSIVE METABOLIC PANEL
ALT: 19 U/L (ref 0–44)
AST: 28 U/L (ref 15–41)
Albumin: 4.3 g/dL (ref 3.5–5.0)
Alkaline Phosphatase: 47 U/L (ref 38–126)
Anion gap: 16 — ABNORMAL HIGH (ref 5–15)
BUN: 36 mg/dL — ABNORMAL HIGH (ref 8–23)
CO2: 23 mmol/L (ref 22–32)
Calcium: 10.4 mg/dL — ABNORMAL HIGH (ref 8.9–10.3)
Chloride: 100 mmol/L (ref 98–111)
Creatinine, Ser: 1.91 mg/dL — ABNORMAL HIGH (ref 0.44–1.00)
GFR, Estimated: 27 mL/min — ABNORMAL LOW (ref >60)
Glucose, Bld: 113 mg/dL — ABNORMAL HIGH (ref 70–99)
Potassium: 3.7 mmol/L (ref 3.5–5.1)
Sodium: 139 mmol/L (ref 135–145)
Total Bilirubin: 0.4 mg/dL (ref 0.3–1.2)
Total Protein: 8 g/dL (ref 6.5–8.1)

## 2020-06-15 LAB — URINALYSIS, ROUTINE W REFLEX MICROSCOPIC
Bilirubin Urine: NEGATIVE
Glucose, UA: NEGATIVE mg/dL
Ketones, ur: NEGATIVE mg/dL
Nitrite: POSITIVE — AB
Protein, ur: NEGATIVE mg/dL
Specific Gravity, Urine: 1.012 (ref 1.005–1.030)
WBC, UA: >50 WBC/hpf — ABNORMAL HIGH (ref 0–5)
pH: 5 (ref 5.0–8.0)

## 2020-06-15 LAB — CBC
HCT: 44.6 % (ref 36.0–46.0)
Hemoglobin: 13.6 g/dL (ref 12.0–15.0)
MCH: 29.5 pg (ref 26.0–34.0)
MCHC: 30.5 g/dL (ref 30.0–36.0)
MCV: 96.7 fL (ref 80.0–100.0)
Platelets: 240 10*3/uL (ref 150–400)
RBC: 4.61 MIL/uL (ref 3.87–5.11)
RDW: 12.9 % (ref 11.5–15.5)
WBC: 5.6 10*3/uL (ref 4.0–10.5)
nRBC: 0 % (ref 0.0–0.2)

## 2020-06-15 LAB — DIFFERENTIAL
Abs Immature Granulocytes: 0.01 10*3/uL (ref 0.00–0.07)
Basophils Absolute: 0 10*3/uL (ref 0.0–0.1)
Basophils Relative: 1 %
Eosinophils Absolute: 0.1 10*3/uL (ref 0.0–0.5)
Eosinophils Relative: 1 %
Immature Granulocytes: 0 %
Lymphocytes Relative: 41 %
Lymphs Abs: 2.3 10*3/uL (ref 0.7–4.0)
Monocytes Absolute: 0.6 10*3/uL (ref 0.1–1.0)
Monocytes Relative: 12 %
Neutro Abs: 2.5 10*3/uL (ref 1.7–7.7)
Neutrophils Relative %: 45 %

## 2020-06-15 LAB — PROTIME-INR
INR: 1.1 (ref 0.8–1.2)
Prothrombin Time: 13.7 seconds (ref 11.4–15.2)

## 2020-06-15 LAB — RESPIRATORY PANEL BY RT PCR (FLU A&B, COVID)
Influenza A by PCR: NEGATIVE
Influenza B by PCR: NEGATIVE
SARS Coronavirus 2 by RT PCR: NEGATIVE

## 2020-06-15 LAB — APTT: aPTT: 30 seconds (ref 24–36)

## 2020-06-15 MED ORDER — ACETAMINOPHEN 500 MG PO TABS
500.0000 mg | ORAL_TABLET | Freq: Four times a day (QID) | ORAL | Status: DC | PRN
  Administered 2020-06-15: 500 mg via ORAL
  Filled 2020-06-15: qty 1

## 2020-06-15 MED ORDER — VITAMIN B-12 1000 MCG PO TABS
1000.0000 ug | ORAL_TABLET | Freq: Every day | ORAL | Status: DC
  Administered 2020-06-15 – 2020-06-17 (×3): 1000 ug via ORAL
  Filled 2020-06-15 (×3): qty 1

## 2020-06-15 MED ORDER — FLUTICASONE PROPIONATE 50 MCG/ACT NA SUSP
2.0000 | Freq: Every day | NASAL | Status: DC | PRN
  Filled 2020-06-15: qty 16

## 2020-06-15 MED ORDER — FAMOTIDINE 20 MG PO TABS
20.0000 mg | ORAL_TABLET | Freq: Every day | ORAL | Status: DC
  Administered 2020-06-16 – 2020-06-17 (×2): 20 mg via ORAL
  Filled 2020-06-15: qty 1
  Filled 2020-06-15: qty 2

## 2020-06-15 MED ORDER — ACYCLOVIR 400 MG PO TABS
400.0000 mg | ORAL_TABLET | Freq: Two times a day (BID) | ORAL | Status: DC
  Administered 2020-06-15 – 2020-06-16 (×2): 400 mg via ORAL
  Filled 2020-06-15 (×2): qty 1

## 2020-06-15 MED ORDER — SODIUM CHLORIDE 0.9% FLUSH
3.0000 mL | Freq: Once | INTRAVENOUS | Status: AC
Start: 2020-06-15 — End: 2020-06-15
  Administered 2020-06-15: 3 mL via INTRAVENOUS

## 2020-06-15 MED ORDER — ONDANSETRON HCL 4 MG/2ML IJ SOLN
4.0000 mg | Freq: Four times a day (QID) | INTRAMUSCULAR | Status: DC | PRN
  Administered 2020-06-16: 4 mg via INTRAVENOUS
  Filled 2020-06-15: qty 2

## 2020-06-15 MED ORDER — ONDANSETRON HCL 4 MG PO TABS: 4.0000 mg | ORAL_TABLET | Freq: Four times a day (QID) | ORAL | Status: DC | PRN

## 2020-06-15 MED ORDER — AMLODIPINE BESYLATE 5 MG PO TABS
2.5000 mg | ORAL_TABLET | Freq: Every day | ORAL | Status: DC
  Administered 2020-06-15: 2.5 mg via ORAL
  Filled 2020-06-15: qty 1

## 2020-06-15 MED ORDER — SODIUM CHLORIDE 0.9 % IV SOLN
1.0000 g | INTRAVENOUS | Status: DC
  Administered 2020-06-15 – 2020-06-16 (×2): 1 g via INTRAVENOUS
  Filled 2020-06-15 (×2): qty 10

## 2020-06-15 MED ORDER — SODIUM CHLORIDE 0.9 % IV BOLUS
1000.0000 mL | Freq: Once | INTRAVENOUS | Status: AC
  Administered 2020-06-15: 1000 mL via INTRAVENOUS

## 2020-06-15 MED ORDER — ESTRADIOL 10 MCG VA TABS: ORAL_TABLET | VAGINAL | Status: DC

## 2020-06-15 MED ORDER — SODIUM CHLORIDE 0.9 % IV SOLN: INTRAVENOUS | Status: DC

## 2020-06-15 MED ORDER — HALOPERIDOL LACTATE 5 MG/ML IJ SOLN
2.5000 mg | Freq: Once | INTRAMUSCULAR | Status: AC
  Administered 2020-06-15: 2.5 mg via INTRAVENOUS
  Filled 2020-06-15: qty 1

## 2020-06-15 MED ORDER — LEVOTHYROXINE SODIUM 100 MCG PO TABS
100.0000 ug | ORAL_TABLET | Freq: Every day | ORAL | Status: DC
  Administered 2020-06-16 – 2020-06-17 (×2): 100 ug via ORAL
  Filled 2020-06-15 (×2): qty 1

## 2020-06-15 MED ORDER — QUETIAPINE FUMARATE 25 MG PO TABS
12.5000 mg | ORAL_TABLET | Freq: Two times a day (BID) | ORAL | Status: DC | PRN
  Administered 2020-06-16: 12.5 mg via ORAL
  Filled 2020-06-15: qty 1

## 2020-06-15 MED ORDER — QUETIAPINE FUMARATE 25 MG PO TABS
12.5000 mg | ORAL_TABLET | Freq: Every day | ORAL | Status: DC
  Administered 2020-06-15 – 2020-06-16 (×2): 12.5 mg via ORAL
  Filled 2020-06-15 (×2): qty 1

## 2020-06-15 NOTE — ED Notes (Signed)
Patient transported to CT 

## 2020-06-15 NOTE — Telephone Encounter (Signed)
Patient was seen on 06-14-2020

## 2020-06-15 NOTE — ED Provider Notes (Signed)
[name] DEPART[facility] Provider Note   CSN: [id number] Arrival date & time: 06/15/20  1243     History Chief Complaint  Patient presents with  . Altered Mental Status    [name] [name] is a 79 y.o. female.  The history is provided by the patient and a caregiver.  Altered Mental Status Presenting symptoms: behavior changes, combativeness and lethargy   Severity:  Moderate Most recent episode:  More than 2 days ago Episode history:  Multiple Timing:  Intermittent Progression:  Waxing and waning Chronicity:  New Context: recent illness (recent hemorrhagic stroke)   Associated symptoms: decreased appetite and weakness (generalizing)   Associated symptoms: no abdominal pain, normal movement, no agitation, no bladder incontinence, no fever, no palpitations, no rash, no seizures and no vomiting        Past Medical History:  Diagnosis Date  . Adenomatous colon polyp   . Anxiety   . Cataract 2013   bilat removed   . DJD (degenerative joint disease) of lumbar spine   . Estrogen deficiency   . Gastritis   . GERD (gastroesophageal reflux disease)   . Headache(784.0)   . Helicobacter pylori infection 10/13/2007   Qualifier: Diagnosis of  By: Marland Mcalpine    . History of positive PPD 10/16/2011  . HSV-2 infection   . Hyperglycemia   . Hyperlipidemia    no meds now  . Hypertension   . IBS (irritable bowel syndrome)   . Medication side effect - Propofol (burning vein) and prvastatin (myalgia) 04/08/2013   Pravastatin patient went off and rechallenged and symptoms recurred muscle cramps /foot cramps   . Neuromuscular disorder (HCC)    muscle cramps  . Pulsatile tinnitus    had MRA and MPV nl mild carotid dopplers2010  . Shingles 02/09/2011   Right upper face and eyelid but not involving the eye at this point.   . Sleep apnea    does not use c pap  . Thyroid disease   . Vulvitis     Patient Active Problem List   Diagnosis Date Noted  .  AMS (altered mental status) 06/15/2020  . UTI (urinary tract infection) 06/15/2020  . Essential hypertension 06/15/2020  . Dizziness 12/07/2018  . Educated about COVID-19 virus infection 12/07/2018  . Dermatofibroma 12/23/2017  . Osteoarthritis of right hip 10/02/2017  . Multinodular goiter 06/15/2015  . Post-surgical hypothyroidism 06/15/2015  . Leg pain, lateral 08/30/2014  . Hyperglycemia 11/05/2013  . Family history of Alzheimer's disease 11/05/2013  . Memory problem 11/05/2013  . Medication side effect - Propofol (burning vein) and prvastatin (myalgia) 04/08/2013  . Lesion of spleen 04/08/2013  . Arthritis of shoulder 11/03/2012  . History of back surgery 11/03/2012  . HSV-2 seropositive 02/17/2012  . Medicare annual wellness visit, subsequent 10/16/2011  . OSA (obstructive sleep apnea) 10/16/2011  . History of positive PPD 10/16/2011  . History of colonic polyps 07/18/2011  . Dyspnea 10/18/2010  . Heart burn 10/18/2010  . Chest pain 10/18/2010  . ADJUSTMENT DISORDER WITH ANXIETY 07/24/2010  . CHEST WALL PAIN, ANTERIOR 07/24/2010  . KNEE PAIN 04/04/2010  . Disturbance in sleep behavior 11/21/2009  . LEG PAIN 02/28/2009  . TINNITUS 05/11/2008  . HEADACHE 02/07/2008  . UNSPECIFIED VITAMIN D DEFICIENCY 02/05/2008  . OBESITY, MILD 10/13/2007  . ANEMIA 10/13/2007  . GASTRITIS, CHRONIC 10/13/2007  . DUODENITIS, WITHOUT HEMORRHAGE 10/13/2007  . HIATAL HERNIA 10/13/2007  . IRRITABLE BOWEL SYNDROME 10/13/2007  . GRANULOMA 10/13/2007  . RHINITIS  09/23/2007  . FASTING HYPERGLYCEMIA 09/04/2007  . HYPERLIPIDEMIA 04/07/2007  . HYPERTENSION 04/07/2007  . GERD 04/07/2007    Past Surgical History:  Procedure Laterality Date  . ABDOMINAL HYSTERECTOMY  1982   fibroids  . COLONOSCOPY  2003- 2102   adenomatous polyps  . FINGER SURGERY     left middle A1pulley release  . isthmuectomy     thyroid surgery  . LUMBAR SPINE SURGERY  05/1989   spine d/t tumor neurofibroma Quentin Cornwall    . POLYPECTOMY    . SHOULDER ARTHROSCOPY Bilateral    left, right 2017  . THYROIDECTOMY     partial rt  ballen  . TONSILLECTOMY     at age 51.   Marland Kitchen TOTAL HIP ARTHROPLASTY Right 10/02/2017   Procedure: RIGHT TOTAL HIP ARTHROPLASTY ANTERIOR APPROACH;  Surgeon: Rod Can, MD;  Location: [facility];  Service: Orthopedics;  Laterality: Right;  Needs RNFA  . TUBAL LIGATION  1974     OB History    Gravida  3   Para  3   Term      Preterm      AB      Living        SAB      TAB      Ectopic      Multiple      Live Births              Family History  Problem Relation Age of Onset  . Alzheimer's disease Mother   . Heart attack Father        44  . Asthma Sister   . Colon cancer Sister   . Heart attack Brother        79  . Colon polyps Sister   . Colon cancer Sister   . Colon cancer Paternal Aunt   . Thyroid disease Sister        multinodular goiter  . Stomach cancer Neg Hx   . Breast cancer Neg Hx   . Rectal cancer Neg Hx     Social History   Tobacco Use  . Smoking status: Former Smoker    Packs/day: 0.50    Years: 23.00    Pack years: 11.50    Types: Cigarettes    Quit date: 06/16/1984    Years since quitting: 36.0  . Smokeless tobacco: Never Used  Vaping Use  . Vaping Use: Never used  Substance Use Topics  . Alcohol use: No  . Drug use: No    Home Medications Prior to Admission medications   Medication Sig Start Date End Date Taking? Authorizing Provider  acetaminophen (TYLENOL) 500 MG tablet Take 500 mg by mouth every 6 (six) hours as needed for mild pain, moderate pain or headache. Only taking 2 pills per week    [provider]  acyclovir (ZOVIRAX) 400 MG tablet Take 400 mg by mouth 2 (two) times daily as needed.  10/22/19   [provider]  Cyanocobalamin (B-12 PO) Take by mouth.    [provider]  Estradiol (VAGIFEM) 10 MCG TABS vaginal tablet Vagifem 10 mcg vaginal tablet  INSERT 1 TABLET(S) IN VAGINA TWICE A  WEEK    [provider]  famotidine (PEPCID) 20 MG tablet Take 1 tablet (20 mg total) by mouth 2 (two) times daily. 01/22/19   Panosh, Standley Brooking, MD  fluticasone (FLONASE) 50 MCG/ACT nasal spray Place 2 sprays into both nostrils as needed for allergies. FOR RHINITIS 12/03/19   Panosh, Mariann Laster  K, MD  Polyethyl Glycol-Propyl Glycol (SYSTANE) 0.4-0.3 % SOLN Systane (PF) 0.4 %-0.3 % eye drops in a dropperette    [provider]  SYNTHROID 100 MCG tablet TAKE 1 TABLET BY MOUTH EVERY DAY 08/05/19   Panosh, Standley Brooking, MD  triamterene-hydrochlorothiazide (MAXZIDE-25) 37.5-25 MG tablet TAKE 1 TABLET BY MOUTH EVERY DAY 08/05/19   Panosh, Standley Brooking, MD    Allergies    Lisinopril, Lyrica [pregabalin], Pravastatin, Simvastatin, Codeine phosphate, Oxycodone hcl, and Strawberry extract  Review of Systems   Review of Systems  Constitutional: Positive for decreased appetite. Negative for chills and fever.  HENT: Negative for ear pain and sore throat.   Eyes: Negative for pain and visual disturbance.  Respiratory: Negative for cough and shortness of breath.   Cardiovascular: Negative for chest pain and palpitations.  Gastrointestinal: Negative for abdominal pain and vomiting.  Genitourinary: Negative for bladder incontinence, dysuria and hematuria.  Musculoskeletal: Negative for arthralgias and back pain.  Skin: Negative for color change and rash.  Neurological: Positive for weakness (generalizing). Negative for seizures and syncope.  Psychiatric/Behavioral: Negative for agitation.  All other systems reviewed and are negative.   Physical Exam Updated Vital Signs  ED Triage Vitals  Enc Vitals Group     BP 06/15/20 1251 131/71     Pulse Rate 06/15/20 1251 77     Resp 06/15/20 1251 18     Temp 06/15/20 1251 98.5 F (36.9 C)     Temp Source 06/15/20 1251 Oral     SpO2 06/15/20 1251 96 %     Weight --      Height --      Head Circumference --      Peak Flow --      Pain Score 06/15/20  1253 0     Pain Loc --      Pain Edu? --      Excl. in [place]? --     Physical Exam  ED Results / Procedures / Treatments   Labs (all labs ordered are listed, but only abnormal results are displayed) Labs Reviewed  COMPREHENSIVE METABOLIC PANEL - Abnormal; Notable for the following components:      Result Value   Glucose, Bld 113 (*)    BUN 36 (*)    Creatinine, Ser 1.91 (*)    Calcium 10.4 (*)    GFR, Estimated 27 (*)    Anion gap 16 (*)    All other components within normal limits  URINALYSIS, ROUTINE W REFLEX MICROSCOPIC - Abnormal; Notable for the following components:   APPearance CLOUDY (*)    Hgb urine dipstick SMALL (*)    Nitrite POSITIVE (*)    Leukocytes,Ua LARGE (*)    WBC, UA >50 (*)    Bacteria, UA MANY (*)    All other components within normal limits  RESPIRATORY PANEL BY RT PCR (FLU A&B, COVID)  URINE CULTURE  CBC  DIFFERENTIAL  PROTIME-INR  APTT  CBG MONITORING, ED    EKG EKG Interpretation  Date/Time:  Thursday June 15 2020 12:43:10 EDT Ventricular Rate:  72 PR Interval:  144 QRS Duration: 84 QT Interval:  292 QTC Calculation: 319 R Axis:   21 Text Interpretation: Normal sinus rhythm Nonspecific T wave abnormality Abnormal ECG Confirmed by Lennice Sites [phone]) on 06/15/2020 1:15:45 PM Also confirmed by Lennice Sites [phone]), editor Hattie Perch [phone])  on 06/15/2020 2:41:10 PM   Radiology CT HEAD WO CONTRAST  Result Date: 06/15/2020 CLINICAL DATA:  Follow-up intracranial hemorrhage.  Stroke. EXAM: CT HEAD WITHOUT CONTRAST TECHNIQUE: Contiguous axial images were obtained from the base of the skull through the vertex without intravenous contrast. COMPARISON:  CT head 05/25/2020 FINDINGS: Brain: Interval improvement in high-density hemorrhage in the left occipital temporal lobe. Increased surrounding hypodensity. The area of hypodensity corresponds to the left hippocampus. No new area of hemorrhage. Negative for hydrocephalus. Vascular:  Negative for hyperdense vessel Skull: Negative Sinuses/Orbits: Paranasal sinuses clear. Bilateral cataract extraction. Other: None IMPRESSION: Improving hemorrhage in the left temporal occipital lobe. Increased surrounding low-density edema with mild local mass-effect. No midline shift. No new hemorrhage. Electronically Signed   By: Franchot Gallo M.D.   On: 06/15/2020 14:43    Procedures Procedures (including critical care time)  Medications Ordered in ED Medications  sodium chloride flush (NS) 0.9 % injection 3 mL (has no administration in time range)  acetaminophen (TYLENOL) tablet 500 mg (has no administration in time range)  acyclovir (ZOVIRAX) tablet 400 mg (has no administration in time range)  levothyroxine (SYNTHROID) tablet 100 mcg (has no administration in time range)  famotidine (PEPCID) tablet 20 mg (has no administration in time range)  Estradiol vaginal tablet (has no administration in time range)  B-12 TABS (has no administration in time range)  fluticasone (FLONASE) 50 MCG/ACT nasal spray 2 spray (has no administration in time range)  ondansetron (ZOFRAN) tablet 4 mg (has no administration in time range)    Or  ondansetron (ZOFRAN) injection 4 mg (has no administration in time range)  0.9 %  sodium chloride infusion (has no administration in time range)  cefTRIAXone (ROCEPHIN) 1 g in sodium chloride 0.9 % 100 mL IVPB (has no administration in time range)  amLODipine (NORVASC) tablet 2.5 mg (has no administration in time range)  sodium chloride 0.9 % bolus 1,000 mL (1,000 mLs Intravenous New Bag/Given 06/15/20 1405)    ED Course  I have reviewed the triage vital signs and the nursing notes.  Pertinent labs & imaging results that were available during my care of the patient were reviewed by me and considered in my medical decision making (see chart for details).    MDM Rules/Calculators/A&P                          [name] [name] is a 79 year old female with history  of reflux, hypertension, recent hemorrhagic stroke, high cholesterol who presents the ED with altered mental status.  Patient with normal vitals.  No fever.  Patient has been home for several days after rehab.  Is having some aggressive behavior despite being on Seroquel.  Overall has had ongoing changes in her behavior and mental status since the stroke.  She is not doing well with eating or drinking.  Overall she appears neurologically intact on exam.  She follows exams.  Does not appear to have any new stroke symptoms.  EKG shows sinus rhythm.  No ischemic changes.  Head CT showed no new head bleed.  Does some increased surrounding low-density edema with mild local mass-effect but no midline shift or new hemorrhage. she does have an acute kidney injury as her creatinine is 1.91.  BUN is elevated as well.  Otherwise no significant leukocytosis, electrolyte abnormality.  Urinalysis is pending.  EKG was unremarkable.  Will admit to medicine for hydration as this could be playing a role in her mental status.  Will test patient with neurology and psychiatry as she will likely need some adjustments to her medications following her  stroke as she is having some behavior changes. Patient also found and given IV antibiotics. Admitted to the medicine team in good condition.  This chart was dictated using voice recognition software.  Despite best efforts to proofread,  errors can occur which can change the documentation meaning.     Final Clinical Impression(s) / ED Diagnoses Final diagnoses:  Altered mental status, unspecified altered mental status type  AKI (acute kidney injury) ([place])  Acute cystitis without hematuria    Rx / DC Orders ED Discharge Orders    None       Lennice Sites, DO 06/15/20 1614

## 2020-06-15 NOTE — H&P (Addendum)
History and Physical    [name] [name] [id number]:[id number] DOB: January 31, 1941 DOA: 06/15/2020  PCP: Burnis Medin, MD   Patient coming from: Home  I have personally briefly reviewed patient's old medical records in [place]  Chief Complaint: Altered mental status Most of the history was obtained from patient's family at the bedside as well as from the EMR  HPI: [name] [name] is a 79 y.o. 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  Family states that patient has also become more irritable and usually has episodes of agitation where they usually able to calm her down but their biggest concern is her inability to sleep at night.  She was seen by her primary care provider 24 hours prior to presenting in the ER who increased her Seroquel to 25 mg twice daily as needed for agitation.  She took Seroquel 25 mg last night and only slept for 3 hours..  Patient has 24-hour supervision/care at home. She denies having any chest pain, no shortness of breath, no dizziness, no lightheadedness, no nausea, no vomiting, no fever, no chills, no urinary symptoms, no abdominal pain or changes in her bowel habits. Labs show sodium 139, potassium 3.7, chloride 100, bicarb 23, BUN 36, creatinine 1.91 compared to baseline of 0.8, calcium 10.4, alkaline phosphatase 47, albumin 4.3, AST 28, ALT 19, total protein 8.0, white count 5.6, hemoglobin 13.6, hematocrit 44.6, MCV 96.7, RDW 12.9, platelet count 240 Respiratory viral panel was negative Urine analysis shows pyuria with positive nitrite and large leukocyte esterase CT scan of the head without contrast shows improving hemorrhage in the left temporal subdural.   Increased surrounding low-density edema with mild local mass-effect.  No midline shift.  No new hemorrhage. Twelve-lead EKG shows normal sinus rhythm with nonspecific T wave abnormality    ED Course: Patient is a 79 year old female with a history of a recent stroke who presents to the emergency room for evaluation of confusion, increased aggressive behavior.  Her CT scan shows improved hemorrhage in the left temporal/occipital area.  She also has pyuria and will be referred to observation for further evaluation.  Review of Systems: As per HPI otherwise 10 point review of systems negative.    Past Medical History:  Diagnosis Date  . Adenomatous colon polyp   . Anxiety   . Cataract 2013   bilat removed   . DJD (degenerative joint disease) of lumbar spine   . Estrogen deficiency   . Gastritis   . GERD (gastroesophageal reflux disease)   . Headache(784.0)   . Helicobacter pylori infection 10/13/2007   Qualifier: Diagnosis of  By: Marland Mcalpine    . History of positive PPD 10/16/2011  . HSV-2 infection   . Hyperglycemia   . Hyperlipidemia    no meds now  . Hypertension   . IBS (irritable bowel syndrome)   . Medication side effect - Propofol (burning vein) and prvastatin (myalgia) 04/08/2013   Pravastatin patient went off and rechallenged and symptoms recurred muscle cramps /foot cramps   . Neuromuscular disorder (HCC)    muscle cramps  . Pulsatile tinnitus    had MRA and MPV nl mild carotid dopplers2010  . Shingles 02/09/2011   Right upper face and eyelid but not involving the eye at this  point.   . Sleep apnea    does not use c pap  . Thyroid disease   . Vulvitis     Past Surgical History:  Procedure Laterality Date  . ABDOMINAL HYSTERECTOMY  1982   fibroids  . COLONOSCOPY  2003- 2102   adenomatous polyps  . FINGER SURGERY     left middle A1pulley release  . isthmuectomy     thyroid surgery  . LUMBAR SPINE SURGERY  05/1989   spine d/t tumor neurofibroma Quentin Cornwall  .  POLYPECTOMY    . SHOULDER ARTHROSCOPY Bilateral    left, right 2017  . THYROIDECTOMY     partial rt  ballen  . TONSILLECTOMY     at age 5.   Marland Kitchen TOTAL HIP ARTHROPLASTY Right 10/02/2017   Procedure: RIGHT TOTAL HIP ARTHROPLASTY ANTERIOR APPROACH;  Surgeon: Rod Can, MD;  Location: [facility];  Service: Orthopedics;  Laterality: Right;  Needs RNFA  . TUBAL LIGATION  1974     reports that she quit smoking about 36 years ago. Her smoking use included cigarettes. She has a 11.50 pack-year smoking history. She has never used smokeless tobacco. She reports that she does not drink alcohol and does not use drugs.  Allergies  Allergen Reactions  . Lisinopril     REACTION: COUGH  . Lyrica [Pregabalin] Other (See Comments)    Cns side efffects  . Pravastatin Other (See Comments)    Cramps.  . Simvastatin     Myalgia and stiffness  . Codeine Phosphate     REACTION: itching  . Oxycodone Hcl     REACTION: rash  . Strawberry Extract     Family History  Problem Relation Age of Onset  . Alzheimer's disease Mother   . Heart attack Father        32  . Asthma Sister   . Colon cancer Sister   . Heart attack Brother        85  . Colon polyps Sister   . Colon cancer Sister   . Colon cancer Paternal Aunt   . Thyroid disease Sister        multinodular goiter  . Stomach cancer Neg Hx   . Breast cancer Neg Hx   . Rectal cancer Neg Hx      Prior to Admission medications   Medication Sig Start Date End Date Taking? Authorizing Provider  acetaminophen (TYLENOL) 500 MG tablet Take 500 mg by mouth every 6 (six) hours as needed for mild pain, moderate pain or headache. Only taking 2 pills per week    [provider]  acyclovir (ZOVIRAX) 400 MG tablet Take 400 mg by mouth 2 (two) times daily as needed.  10/22/19   [provider]  Cyanocobalamin (B-12 PO) Take by mouth.    [provider]  Estradiol (VAGIFEM) 10 MCG TABS vaginal tablet Vagifem 10 mcg vaginal tablet   INSERT 1 TABLET(S) IN VAGINA TWICE A WEEK    [provider]  famotidine (PEPCID) 20 MG tablet Take 1 tablet (20 mg total) by mouth 2 (two) times daily. 01/22/19   Panosh, Standley Brooking, MD  fluticasone (FLONASE) 50 MCG/ACT nasal spray Place 2 sprays into both nostrils as needed for allergies. FOR RHINITIS 12/03/19   Panosh, Standley Brooking, MD  Polyethyl Glycol-Propyl Glycol (SYSTANE) 0.4-0.3 % SOLN Systane (PF) 0.4 %-0.3 % eye drops in a dropperette    [provider]  SYNTHROID 100 MCG tablet TAKE 1 TABLET BY MOUTH EVERY DAY 08/05/19  Panosh, Standley Brooking, MD  triamterene-hydrochlorothiazide (MAXZIDE-25) 37.5-25 MG tablet TAKE 1 TABLET BY MOUTH EVERY DAY 08/05/19   Panosh, Standley Brooking, MD    Physical Exam: Vitals:   06/15/20 1445 06/15/20 1500 06/15/20 1515 06/15/20 1530  BP: 122/61 99/78 116/62 (!) 107/57  Pulse: 62 69 73 64  Resp: 12 (!) 23 14 15   Temp:      TempSrc:      SpO2: 99% 100% 100% 100%     Vitals:   06/15/20 1445 06/15/20 1500 06/15/20 1515 06/15/20 1530  BP: 122/61 99/78 116/62 (!) 107/57  Pulse: 62 69 73 64  Resp: 12 (!) 23 14 15   Temp:      TempSrc:      SpO2: 99% 100% 100% 100%    Constitutional: NAD, alert and oriented x 2.  Person and place Eyes: PERRL, lids and conjunctivae pallor ENMT: Mucous membranes are moist.  Neck: normal, supple, no masses, no thyromegaly Respiratory: clear to auscultation bilaterally, no wheezing, no crackles. Normal respiratory effort. No accessory muscle use.  Cardiovascular: Regular rate and rhythm,no murmurs / rubs / gallops. No extremity edema. 2+ pedal pulses. No carotid bruits.  Abdomen: no tenderness, no masses palpated. No hepatosplenomegaly. Bowel sounds positive.  Musculoskeletal: no clubbing / cyanosis. No joint deformity upper and lower extremities.  Skin: no rashes, lesions, ulcers.  Neurologic: No gross focal neurologic deficit. Psychiatric: Normal mood and affect.   Labs on Admission: I have personally reviewed  following labs and imaging studies  CBC: Recent Labs  Lab 06/15/20 1255  WBC 5.6  NEUTROABS 2.5  HGB 13.6  HCT 44.6  MCV 96.7  PLT 638   Basic Metabolic Panel: Recent Labs  Lab 06/15/20 1255  NA 139  K 3.7  CL 100  CO2 23  GLUCOSE 113*  BUN 36*  CREATININE 1.91*  CALCIUM 10.4*   GFR: Estimated Creatinine Clearance: 25.8 mL/min (A) (by C-G formula based on SCr of 1.91 mg/dL (H)). Liver Function Tests: Recent Labs  Lab 06/15/20 1255  AST 28  ALT 19  ALKPHOS 47  BILITOT 0.4  PROT 8.0  ALBUMIN 4.3   No results for input(s): LIPASE, AMYLASE in the last 168 hours. No results for input(s): AMMONIA in the last 168 hours. Coagulation Profile: No results for input(s): INR, PROTIME in the last 168 hours. Cardiac Enzymes: No results for input(s): CKTOTAL, CKMB, CKMBINDEX, TROPONINI in the last 168 hours. BNP (last 3 results) No results for input(s): PROBNP in the last 8760 hours. HbA1C: No results for input(s): HGBA1C in the last 72 hours. CBG: No results for input(s): GLUCAP in the last 168 hours. Lipid Profile: No results for input(s): CHOL, HDL, LDLCALC, TRIG, CHOLHDL, LDLDIRECT in the last 72 hours. Thyroid Function Tests: No results for input(s): TSH, T4TOTAL, FREET4, T3FREE, THYROIDAB in the last 72 hours. Anemia Panel: No results for input(s): VITAMINB12, FOLATE, FERRITIN, TIBC, IRON, RETICCTPCT in the last 72 hours. Urine analysis:    Component Value Date/Time   COLORURINE YELLOW 06/15/2020 1440   APPEARANCEUR CLOUDY (A) 06/15/2020 1440   LABSPEC 1.012 06/15/2020 1440   PHURINE 5.0 06/15/2020 1440   GLUCOSEU NEGATIVE 06/15/2020 1440   HGBUR SMALL (A) 06/15/2020 1440   HGBUR negative 09/23/2008 0958   BILIRUBINUR NEGATIVE 06/15/2020 1440   BILIRUBINUR neg 02/17/2012 1056   KETONESUR NEGATIVE 06/15/2020 1440   PROTEINUR NEGATIVE 06/15/2020 1440   UROBILINOGEN 1.0 02/17/2012 1056   UROBILINOGEN 1.0 09/23/2008 0958   NITRITE POSITIVE (A) 06/15/2020  1440  LEUKOCYTESUR LARGE (A) 06/15/2020 1440    Radiological Exams on Admission: CT HEAD WO CONTRAST  Result Date: 06/15/2020 CLINICAL DATA:  Follow-up intracranial hemorrhage.  Stroke. EXAM: CT HEAD WITHOUT CONTRAST TECHNIQUE: Contiguous axial images were obtained from the base of the skull through the vertex without intravenous contrast. COMPARISON:  CT head 05/25/2020 FINDINGS: Brain: Interval improvement in high-density hemorrhage in the left occipital temporal lobe. Increased surrounding hypodensity. The area of hypodensity corresponds to the left hippocampus. No new area of hemorrhage. Negative for hydrocephalus. Vascular: Negative for hyperdense vessel Skull: Negative Sinuses/Orbits: Paranasal sinuses clear. Bilateral cataract extraction. Other: None IMPRESSION: Improving hemorrhage in the left temporal occipital lobe. Increased surrounding low-density edema with mild local mass-effect. No midline shift. No new hemorrhage. Electronically Signed   By: Franchot Gallo M.D.   On: 06/15/2020 14:43    EKG: Independently reviewed.  Normal sinus rhythm Nonspecific T wave abnormality  Assessment/Plan Principal Problem:   AMS (altered mental status) Active Problems:   OSA (obstructive sleep apnea)   UTI (urinary tract infection)   Essential hypertension     Altered mental status Family states that she has had increasing confusion, agitation and irritability May be secondary to urinary tract infection versus sequelae of her acute stroke We will treat patient empirically with Rocephin for UTI We will request psych consult for medication adjustment Neurology was consulted and they recommended an EEG which has been ordered    Recent hemorrhagic stroke  CT scan of the head without contrast shows improving hemorrhage in the left temporal occipital lobe. Increased surrounding low-density edema with mild local mass-effect. No midline shift. No new hemorrhage. Optimize blood pressure  control   Hypertension Hold triamterene/hydrochlorothiazide Patient is normotensive We will start her on low-dose amlodipine    Acute kidney injury Most likely secondary to poor oral intake with concomitant diuretic use Patient has a baseline serum creatinine of 0.8 and today on admission it is 1.9 Hold hydrochlorothiazide Hydrate patient and repeat renal parameters in a.m.    Hypothyroidism Continue Synthroid  DVT prophylaxis: SCD Code Status: Full code Family Communication: Greater than 50% of time was spent discussing patient's condition and plan of care with her son at the bedside.  All questions and concerns have been addressed.  He verbalizes understanding and agrees with the plan. Disposition Plan: Back to previous home environment Consults called: Psychiatry/neurology    Collier Bullock MD [facility]     06/15/2020, 3:59 PM

## 2020-06-15 NOTE — Progress Notes (Addendum)
This is a 79 year old woman with a past medical history significant for CAA and a recent hemorrhage.  She was recently discharged from [facility] and has continued to have agitation at home.  She is not on any antiseizure medications.  Discussed with ED provider that an EEG should be obtained to rule out epileptogenic activity.  Neurology should be consulted formally if there is epileptogenic activity or frank epileptiform activity on EEG, otherwise defer management of mood medications to psychiatry.  This is a brief curbside note only.   Lesleigh Noe MD-PhD [facility] [phone]

## 2020-06-15 NOTE — ED Triage Notes (Signed)
Pt's family states pt was recently admitted at [facility] for 2 weeks due to hemorraghic stroke. Pt has been home for a week now. Family reports continued confusion since being discharged and trouble sleeping. Family states the pt has been more irritable and has increased her Seroquel.

## 2020-06-16 ENCOUNTER — Inpatient Hospital Stay (HOSPITAL_COMMUNITY): Payer: Medicare Other

## 2020-06-16 LAB — BASIC METABOLIC PANEL
Anion gap: 10 (ref 5–15)
BUN: 30 mg/dL — ABNORMAL HIGH (ref 8–23)
CO2: 24 mmol/L (ref 22–32)
Calcium: 9.3 mg/dL (ref 8.9–10.3)
Chloride: 105 mmol/L (ref 98–111)
Creatinine, Ser: 1.34 mg/dL — ABNORMAL HIGH (ref 0.44–1.00)
GFR, Estimated: 41 mL/min — ABNORMAL LOW (ref >60)
Glucose, Bld: 104 mg/dL — ABNORMAL HIGH (ref 70–99)
Potassium: 3.7 mmol/L (ref 3.5–5.1)
Sodium: 139 mmol/L (ref 135–145)

## 2020-06-16 LAB — CBC
HCT: 35 % — ABNORMAL LOW (ref 36.0–46.0)
Hemoglobin: 11 g/dL — ABNORMAL LOW (ref 12.0–15.0)
MCH: 29.1 pg (ref 26.0–34.0)
MCHC: 31.4 g/dL (ref 30.0–36.0)
MCV: 92.6 fL (ref 80.0–100.0)
Platelets: 245 10*3/uL (ref 150–400)
RBC: 3.78 MIL/uL — ABNORMAL LOW (ref 3.87–5.11)
RDW: 12.8 % (ref 11.5–15.5)
WBC: 5.9 10*3/uL (ref 4.0–10.5)
nRBC: 0 % (ref 0.0–0.2)

## 2020-06-16 MED ORDER — HALOPERIDOL LACTATE 5 MG/ML IJ SOLN: 5.0000 mg | Freq: Four times a day (QID) | INTRAMUSCULAR | Status: DC | PRN

## 2020-06-16 MED ORDER — HALOPERIDOL LACTATE 5 MG/ML IJ SOLN
5.0000 mg | Freq: Four times a day (QID) | INTRAMUSCULAR | Status: DC | PRN
  Administered 2020-06-16: 5 mg via INTRAMUSCULAR
  Filled 2020-06-16: qty 1

## 2020-06-16 MED ORDER — DIVALPROEX SODIUM 250 MG PO DR TAB
500.0000 mg | DELAYED_RELEASE_TABLET | Freq: Two times a day (BID) | ORAL | Status: DC
  Administered 2020-06-16 – 2020-06-17 (×3): 500 mg via ORAL
  Filled 2020-06-16 (×3): qty 2

## 2020-06-16 NOTE — Consult Note (Addendum)
Neurology Consultation Reason for Consult: altered mental status Referring Physician: Dahal  CC: altered mental status.  History is obtained from:chart as well as patient's son, [name] [name] via phone.  HPI: [name] [name] is a 79 y.o. female  With past medical history significant for CAA, as well as recent left sided hemorrhagic stroke with confusion and disorientation, poststroke aphasia, hypertension, anxiety/depression, hypothyroidism who was brought into the ER by family members for evaluation of continued confusion since her hospital/rehab discharge as well as difficulty sleeping.  Per son, patient was admitted into [facility] approximately 3 weeks ago with spontaneously occuring Left ICH. There was no operative intervention performed. She was discharged to rehab facility about one week ago; Family states that patient has   become more irritable with frequent episodes of agitation. She was brought to the hospital for evaluation, and neurology was consulted regarding her altered mental status and agitation. We thank her primary team for the kind consultation.  LKW: unclear tPA given?: No    Premorbid modified rankin scale: 1/ had headaches and occasional dizziness.     1 - No significant disability. Able to carry out all usual activities, despite some symptoms.  ICH Score:    Time performed:   GCS: 5-12 is 1 point Infratentorial: No.. Volume: >30cc is 1 point  Age: 79 y.o.. >80 is 1 point Intraventricular extension is 1 point  Score:2  A Score of 2 points has a 30 day mortality of 26%. Stroke. 2001 Apr;32(4):891-7.    ROS: A 14 point ROS was performed and is negative except as noted in the    Past Medical History:  Diagnosis Date  . Adenomatous colon polyp   . Anxiety   . Cataract 2013   bilat removed   . DJD (degenerative joint disease) of lumbar spine   . Estrogen deficiency   . Gastritis   . GERD (gastroesophageal reflux disease)   . Headache(784.0)   .  Helicobacter pylori infection 10/13/2007   Qualifier: Diagnosis of  By: Marland Mcalpine    . History of positive PPD 10/16/2011  . HSV-2 infection   . Hyperglycemia   . Hyperlipidemia    no meds now  . Hypertension   . IBS (irritable bowel syndrome)   . Medication side effect - Propofol (burning vein) and prvastatin (myalgia) 04/08/2013   Pravastatin patient went off and rechallenged and symptoms recurred muscle cramps /foot cramps   . Neuromuscular disorder (HCC)    muscle cramps  . Pulsatile tinnitus    had MRA and MPV nl mild carotid dopplers2010  . Shingles 02/09/2011   Right upper face and eyelid but not involving the eye at this point.   . Sleep apnea    does not use c pap  . Thyroid disease   . Vulvitis       Family History  Problem Relation Age of Onset  . Alzheimer's disease Mother   . Heart attack Father        73  . Asthma Sister   . Colon cancer Sister   . Heart attack Brother        22  . Colon polyps Sister   . Colon cancer Sister   . Colon cancer Paternal Aunt   . Thyroid disease Sister        multinodular goiter  . Stomach cancer Neg Hx   . Breast cancer Neg Hx   . Rectal cancer Neg Hx       Social History:  She has never used smokeless tobacco. She reports that she does not drink alcohol and does not use drugs.      Exam: Current vital signs: BP (!) 122/48 (BP Location: Left Arm)   Pulse 66   Temp 98.8 F (37.1 C) (Oral)   Resp 20   SpO2 98%  Vital signs in last 24 hours: Temp:  [98.2 F (36.8 C)-99.1 F (37.3 C)] 98.8 F (37.1 C) (11/05 1222) Pulse Rate:  [58-79] 66 (11/05 1222) Resp:  [12-23] 20 (11/05 1222) BP: (99-138)/(48-78) 122/48 (11/05 1222) SpO2:  [97 %-100 %] 98 % (11/05 1222)   Physical Exam  Constitutional: 79 year old right handed black female in no apparent distress.  Appears well-developed and well-nourished.  Psych: she is not oriented to place or time. She appears to have visual hallucinations and displays paranoid  behavior.  Eyes: No scleral injection HENT: No OP obstrucion MSK: no joint deformities.  Cardiovascular: Normal rate and regular rhythm.  Respiratory: Effort normal, non-labored breathing GI: Soft.  No distension. There is no tenderness.  Skin: WDI  Neuro:   Mental Status: Patient is awake, alert, oriented to person but no place or month or year.  Patient is unwilling at this time to give a clear and coherent history.   Cranial Nerves: II: Visual Fields are full. Pupils are equal, round, and reactive to light.     III,IV, VI: EOMI without ptosis or diploplia.  V: Facial sensation is symmetric to temperature VII: Facial movement is symmetric.  VIII: hearing is intact to voice X: Unable to assess/pt uncooperative XI: Shoulder shrug is symmetric. XII: tongue is midline without atrophy or fasciculations.   Motor: Tone is normal. Bulk is normal. 5/5 strength was present in all four extremities.    Sensory: Sensation is symmetric to light touch and temperature in the arms and legs.   Deep Tendon Reflexes: 2+ and symmetric in the biceps and patellae.    Plantars: Toes are downgoing bilaterally.   Cerebellar: Unable to assess at this time.    I have reviewed labs in epic and the results pertinent to this consultation are:  1 d ago 8 yr ago 11 yr ago   Color, Urine YELLOW YELLOW   yellow R   APPearance CLEAR CLOUDYAbnormal   Clear R   Specific Gravity, Urine 1.005 - 1.030 1.012   1.015 R   pH 5.0 - 8.0 5.0  5.0 R  7.0 R   Glucose, UA NEGATIVE mg/dL NEGATIVE     Hgb urine dipstick NEGATIVE SMALLAbnormal     Bilirubin Urine NEGATIVE NEGATIVE   negative R   Ketones, ur NEGATIVE mg/dL NEGATIVE     Protein, ur NEGATIVE mg/dL NEGATIVE     Nitrite NEGATIVE POSITIVEAbnormal   positive R   Leukocytes,Ua NEGATIVE LARGEAbnormal     RBC / HPF 0 - 5 RBC/hpf 21-50     WBC, UA 0 - 5 WBC/hpf >50High     Bacteria, UA NONE SEEN MANYAbnormal     Squamous Epithelial / LPF 0 -  5 0-5     WBC Clumps  PRESENT      Results for [name] [name], [name] [name] (MRN [id number]) as of 06/16/2020 13:45  Ref. Range 06/15/2020 12:55  Sodium Latest Ref Range: 135 - 145 mmol/L 139  Potassium Latest Ref Range: 3.5 - 5.1 mmol/L 3.7  Chloride Latest Ref Range: 98 - 111 mmol/L 100  CO2 Latest Ref Range: 22 - 32 mmol/L 23  Glucose Latest Ref Range:  70 - 99 mg/dL 113 (H)  BUN Latest Ref Range: 8 - 23 mg/dL 36 (H)  Creatinine Latest Ref Range: 0.44 - 1.00 mg/dL 1.91 (H)  Calcium Latest Ref Range: 8.9 - 10.3 mg/dL 10.4 (H)  Anion gap Latest Ref Range: 5 - 15  16 (H)  Alkaline Phosphatase Latest Ref Range: 38 - 126 U/L 47  Albumin Latest Ref Range: 3.5 - 5.0 g/dL 4.3  AST Latest Ref Range: 15 - 41 U/L 28  ALT Latest Ref Range: 0 - 44 U/L 19  Total Protein Latest Ref Range: 6.5 - 8.1 g/dL 8.0   Lab Results  Component Value Date   VITAMINB12 429 01/20/2017   Lab Results  Component Value Date   TSH 0.82 11/15/2019   I have personally reviewed the images obtained:   CT head without contrast on 06/15/20  Improving hemorrhage in the left temporal occipital lobe. Increased surrounding low-density edema with mild local mass-effect. No midline shift. No new hemorrhage.  EEG: done 06/16/2020: This study showed evidence of potential epileptogenicity arising from left more than right frontal region as well as moderate diffuse encephalopathy, non specific to to etiology. No seizures were seen throughout the recording  Impression:  79 year old right handed black female with history of CAA and recently occurring left ICH admitted into hospital with altered mental status. CT brain showed improved hemorrhage in left temporal/occipital area.  Workup this admission revealed pyuria. EEG done showed no seizure activity but bifrontal L>R potential epileptic activity as well as diffuse encephalopathy. Given the highly irritating nature of blood to the cortex, will treat with Depakote with the hopes that some of  the patient's agitation is secondary to focal seizures.   Recommendations: 1)  Agree with Depokate 500mg  q 12h. Should get a trough level prior to 5th dose.  2. UTI/pyuria: OK to continue with rocephin, although rocephin is known to decrease seizure threshold. Close monitoring for possible breakthrough seizures please, follow urine cultures for alternate medication. 3) insomnia: agree with seroquel 12.5mg  qhs.  4) appreciate psychiatry following. 5) I spoke with son regarding our plan of care and all his questions were answered. We will continue to follow.  6) Will additionally obtain reversible causes of dementia labs (TSH, B12, thiamine, RPR, HIV, ammonia) to ensure treatable conditions are not being missed   MD addendum Agree with history, physical, assessment and plan as documented above.  I personally fully evaluated the patient in conjunction with the PA and made clarifications and additions to the note as necessary  Lesleigh Noe MD-PhD [facility] [phone]

## 2020-06-16 NOTE — Evaluation (Signed)
Physical Therapy Evaluation Patient Details Name: [name] [name] MRN: [id number] DOB: [birth year] Today's Date: 06/16/2020   History of Present Illness  Pt is a 79 y/o female admitted secondary to AMS. Found to have UTI. Pt also had EEG which revealed evidence of potential epileptogenicity arising from left more than right frontal region. Pt with recent admission to [facility] for hemmorhagic CVA. PMH includes CVA, HTN, and R THA.   Clinical Impression  Pt admitted secondary to problem above with deficits below. Requiring min guard to min A for mobility this session. Pt very confused and restless and only oriented to self. Was perseverating on going to get her grandkids. Pt reporting dizziness which limited gait tolerance. Feel if pt will be able to d/c home with HHPT and 24/7 support. However, if family cannot provide 24/7 assist, will likely need to consider SNF. Will continue to follow acutely.     Follow Up Recommendations Home health PT;Supervision/Assistance - 24 hour    Equipment Recommendations  Other (comment) (TBD)    Recommendations for Other Services       Precautions / Restrictions Precautions Precautions: Fall Restrictions Weight Bearing Restrictions: No      Mobility  Bed Mobility Overal bed mobility: Needs Assistance Bed Mobility: Sit to Supine       Sit to supine: Supervision   General bed mobility comments: Sitting EOB upon entry. Supervision to return to supine.     Transfers Overall transfer level: Needs assistance Equipment used: None Transfers: Sit to/from Stand Sit to Stand: Min guard         General transfer comment: Min guard for safety. No LOB noted.   Ambulation/Gait Ambulation/Gait assistance: Min assist;Min guard Gait Distance (Feet): 40 Feet Assistive device: None Gait Pattern/deviations: Step-through pattern;Decreased stride length Gait velocity: Decreased   General Gait Details: Notable instability with turns and pt reporting  dizziness, so mobility limited. Required min A for stability when turning, otherwise required min guard.   Stairs            Wheelchair Mobility    Modified Rankin (Stroke Patients Only)       Balance Overall balance assessment: Needs assistance Sitting-balance support: No upper extremity supported Sitting balance-Leahy Scale: Good     Standing balance support: No upper extremity supported;During functional activity Standing balance-Leahy Scale: Fair                               Pertinent Vitals/Pain Pain Assessment: No/denies pain    Home Living Family/patient expects to be discharged to:: Private residence Living Arrangements: Spouse/significant other Available Help at Discharge: Family Type of Home: House Home Access: Stairs to enter Entrance Stairs-Rails: Right;Left;Can reach both Entrance Stairs-Number of Steps: 3 Home Layout: Two level Home Equipment: None Additional Comments: Unsure of accuracy given cognitive deficits and no family present.     Prior Function Level of Independence: Independent         Comments: Unsure of accuracy given cognitive deficits.      Hand Dominance        Extremity/Trunk Assessment   Upper Extremity Assessment Upper Extremity Assessment: Defer to OT evaluation    Lower Extremity Assessment Lower Extremity Assessment: Generalized weakness    Cervical / Trunk Assessment Cervical / Trunk Assessment: Normal  Communication   Communication: No difficulties  Cognition Arousal/Alertness: Awake/alert Behavior During Therapy: Restless Overall Cognitive Status: No family/caregiver present to determine baseline cognitive functioning  General Comments: Pt only oriented to self. Perseverating on needing to leave and go get her grandkids to play games with them. Reports she was "not going to stay long here". When asked where she was, states she was in [employer]  just laying there. Poor safety awareness as well.       General Comments General comments (skin integrity, edema, etc.): No family present     Exercises     Assessment/Plan    PT Assessment Patient needs continued PT services  PT Problem List Decreased strength;Decreased balance;Decreased mobility;Decreased knowledge of use of DME;Decreased knowledge of precautions;Decreased safety awareness;Decreased cognition;Decreased activity tolerance       PT Treatment Interventions Gait training;DME instruction;Therapeutic activities;Therapeutic exercise;Functional mobility training;Stair training;Balance training;Patient/family education;Cognitive remediation    PT Goals (Current goals can be found in the Care Plan section)  Acute Rehab PT Goals Patient Stated Goal: to go home and get her grandkids PT Goal Formulation: With patient Time For Goal Achievement: 06/30/20 Potential to Achieve Goals: Good    Frequency Min 3X/week   Barriers to discharge        Co-evaluation               AM-PAC PT "6 Clicks" Mobility  Outcome Measure Help needed turning from your back to your side while in a flat bed without using bedrails?: None Help needed moving from lying on your back to sitting on the side of a flat bed without using bedrails?: None Help needed moving to and from a bed to a chair (including a wheelchair)?: A Little Help needed standing up from a chair using your arms (e.g., wheelchair or bedside chair)?: A Little Help needed to walk in hospital room?: A Little Help needed climbing 3-5 steps with a railing? : A Little 6 Click Score: 20    End of Session Equipment Utilized During Treatment: Gait belt Activity Tolerance: Patient tolerated treatment well Patient left: in bed;with bed alarm set;with call bell/phone within reach Nurse Communication: Mobility status PT Visit Diagnosis: Unsteadiness on feet (R26.81)    Time: 5436-0677 PT Time Calculation (min) (ACUTE ONLY): 14  min   Charges:   PT Evaluation $PT Eval Moderate Complexity: 1 Mod          Lou Miner, DPT  Acute Rehabilitation Services  Pager: [phone] Office: [phone]   Rudean Hitt 06/16/2020, 3:59 PM

## 2020-06-16 NOTE — BHH Counselor (Signed)
Per chart, pt is on the medical floor thus not medically cleared. TTS consult can be completed once pt is medically cleared. Or provider can enter a psychiatric consult for pt.   Please call [facility] staff once pt is medically cleared.  Vertell Novak, MS, [facility], [facility] Triage Specialist [phone].

## 2020-06-16 NOTE — Progress Notes (Signed)
PROGRESS NOTE  [name] [name]  DOB: 06-14-1941  PCP: Burnis Medin, MD IRC:[phone]  DOA: 06/15/2020  LOS: 1 day   Chief Complaint  Patient presents with  . Altered Mental Status   Brief narrative: Patient is a 79 y.o. 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 Patient was brought to the ED on 11/4 by family members for evaluation of continued confusion, agitation and insomnia since her hospital/rehab discharge.   The day prior on 11/3, per PCP increased her Seroquel to 25 mg twice daily as needed for agitation.  She fell asleep for 3 hours but Seroquel but continue to remain confused in the morning and hence she was brought to the ED. Patient has 24-hour supervision/care at home.  In the ED, patient was afebrile, hemodynamically stable. Labs showed creatinine elevated to 1.91, compared to a normal baseline, CBC unremarkable Respiratory viral panel was negative Urinanalysis shows pyuria with positive nitrite and large leukocyte esterase. CT scan of the head without contrast showed improving hemorrhage in the left temporal subdural.  Increased surrounding low-density edema with mild local mass-effect.  No midline shift.  No new hemorrhage.  Patient was admitted to hospital service for further evaluation management.  Subjective: Patient was seen and examined this morning.  Elderly African-American female.  Propped up in bed.  Not in distress.  Slow to respond.  Knows she is in the hospital.  Not able to ask other orientation questions. Chart reviewed.  No fever.  Remains hemodynamically stable. Labs this morning with creatinine improving to 1.34  Assessment/Plan: Acute progressive delirium Acute metabolic encephalopathy New onset seizure -Brought to ED for progressively worsening insomnia, confusion, agitation in the background  of recent subdural hematoma. -Delirium worsened by UTI and possible episodes of seizures and postictal state. -Patient underwent EEG this morning.  Per report, patient has evidence of potential epileptogenic city arising from the left more than the right frontal region as well as moderate diffuse encephalopathy. -Per neurology recommendation, will start the patient on Depakote 500 mg twice daily.  UTI -Urinanalysis on admission showed pyuria with positive nitrite and large leukocyte esterase. -Continue IV Rocephin -Pending urine culture report.  Recent hemorrhagic stroke  -CT scan of the head without contrast shows improving hemorrhage in the left temporal occipital lobe. Increased surrounding low-density edema with mild local mass-effect. No midline shift. No new hemorrhage. Optimize blood pressure control  Essential hypertension -Currently blood pressure is in normal range without meds.  Continue to hold triamterene/hydrochlorothiazide  Acute kidney injury -Creatinine elevated to 1.91 on admission, likely because of poor oral intake and diuretics use.   -Diuretics on hold.  Currently on normal saline at 100 mL/h.  Continue the same.   Recent Labs    11/15/19 0735 06/15/20 1255 06/16/20 0209  BUN 21 36* 30*  CREATININE 0.88 1.91* 1.34*   Hypothyroidism Continue Synthroid  Mobility: PT eval ordered Code Status:   Code Status: Full Code  Nutritional status: There is no height or weight on file to calculate BMI.     Diet Order            Diet 2 gram sodium Room service appropriate? Yes; Fluid consistency: Thin  Diet effective now                 DVT prophylaxis: SCDs Start: 06/15/20 1608   Antimicrobials:  IV Rocephin Fluid: Normal saline at 100 mL/h Consultants: Neurology  Family Communication:  None at bedside  Status is: Inpatient  Remains inpatient appropriate because: Patient continues to be altered, has new onset seizure   Dispo: The patient is from:  Home              Anticipated d/c is to: Home likely              Anticipated d/c date is: 1 to 2 days              Patient currently is not medically stable to d/c.       Infusions:  . sodium chloride 100 mL/hr at 06/16/20 0729  . cefTRIAXone (ROCEPHIN)  IV 1 g (06/15/20 1852)    Scheduled Meds: . divalproex  500 mg Oral Q12H  . famotidine  20 mg Oral Daily  . levothyroxine  100 mcg Oral Daily  . QUEtiapine  12.5 mg Oral QHS  . vitamin B-12  1,000 mcg Oral Daily    Antimicrobials: Anti-infectives (From admission, onward)   Start     Dose/Rate Route Frequency Ordered Stop   06/15/20 2200  acyclovir (ZOVIRAX) tablet 400 mg  Status:  Discontinued        400 mg Oral 2 times daily 06/15/20 1609 06/16/20 1015   06/15/20 1615  cefTRIAXone (ROCEPHIN) 1 g in sodium chloride 0.9 % 100 mL IVPB        1 g 200 mL/hr over 30 Minutes Intravenous Every 24 hours 06/15/20 1609        PRN meds: acetaminophen, fluticasone, ondansetron **OR** ondansetron (ZOFRAN) IV, QUEtiapine   Objective: Vitals:   06/16/20 0406 06/16/20 1222  BP: 113/63 (!) 122/48  Pulse: (!) 58 66  Resp: 14 20  Temp: 98.2 F (36.8 C) 98.8 F (37.1 C)  SpO2: 98% 98%    Intake/Output Summary (Last 24 hours) at 06/16/2020 1346 Last data filed at 06/16/2020 0600 Gross per 24 hour  Intake 1173 ml  Output --  Net 1173 ml   There were no vitals filed for this visit. Weight change:  There is no height or weight on file to calculate BMI.   Physical Exam: General exam: Appears calm and comfortable.  Not in physical distress Skin: No rashes, lesions or ulcers. HEENT: Atraumatic, normocephalic, supple neck, no obvious bleeding Lungs: Clear to auscultation bilaterally CVS: Regular rate and rhythm, no murmur GI/Abd soft, nontender, nondistended, bowel sound present CNS: Alert, awake, oriented to place only, not restless or agitated Psychiatry: Depressed look Extremities: No pedal edema, no calf tenderness  Data  Review: I have personally reviewed the laboratory data and studies available.  Recent Labs  Lab 06/15/20 1255 06/16/20 0209  WBC 5.6 5.9  NEUTROABS 2.5  --   HGB 13.6 11.0*  HCT 44.6 35.0*  MCV 96.7 92.6  PLT 240 245   Recent Labs  Lab 06/15/20 1255 06/16/20 0209  NA 139 139  K 3.7 3.7  CL 100 105  CO2 23 24  GLUCOSE 113* 104*  BUN 36* 30*  CREATININE 1.91* 1.34*  CALCIUM 10.4* 9.3    F/u labs ordered  Signed, Terrilee Croak, MD [facility] 06/16/2020

## 2020-06-16 NOTE — Procedures (Signed)
Patient Name: [name] [name]  MRN: [id number]  Epilepsy Attending: Lora Havens  Referring Physician/Provider: Dr Collier Bullock Date: 06/16/2020 Duration: 27.38mins  Patient history: 79yo F with L temporal ICH and ams. EEG to evaluate for seizure.   Level of alertness: Awake, asleep  AEDs during EEG study: None  Technical aspects: This EEG study was done with scalp electrodes positioned according to the 10-20 International system of electrode placement. Electrical activity was acquired at a sampling rate of 500Hz  and reviewed with a high frequency filter of 70Hz  and a low frequency filter of 1Hz . EEG data were recorded continuously and digitally stored.   Description: No posterior dominant rhythm was seen. Sleep was characterized by sleep spindles (12 to 14 Hz), maximal frontocentral region. Sharp waves were noted in bifrontal L>R region which at times were rhythmic lasting about 3-4 seconds without any evolution. EEG also showed continuous generalized 3 to 6 Hz theta-delta slowing. Hyperventilation and photic stimulation were not performed.     ABNORMALITY -Sharp waves, L>R frontal region -Continuous slow, generalized  IMPRESSION: This study showed evidence of potential epileptogenicity arising from left more than right frontal region as well as moderate diffuse encephalopathy, non specific to to etiology. No seizures were seen throughout the recording.  [name] [name]

## 2020-06-16 NOTE — Progress Notes (Signed)
EEG complete - results pending 

## 2020-06-16 NOTE — Progress Notes (Signed)
Loss of IV assess. IV team came to attempt but patient refused. Increased agitation. MD notified. PRN orders in.

## 2020-06-17 LAB — HIV ANTIBODY (ROUTINE TESTING W REFLEX): HIV Screen 4th Generation wRfx: NONREACTIVE

## 2020-06-17 LAB — VITAMIN B12: Vitamin B-12: 1104 pg/mL — ABNORMAL HIGH (ref 180–914)

## 2020-06-17 LAB — URINE CULTURE: Culture: NO GROWTH

## 2020-06-17 LAB — TSH: TSH: 0.042 u[IU]/mL — ABNORMAL LOW (ref 0.350–4.500)

## 2020-06-17 LAB — AMMONIA: Ammonia: 11 umol/L (ref 9–35)

## 2020-06-17 MED ORDER — SACCHAROMYCES BOULARDII 250 MG PO CAPS: 250.0000 mg | ORAL_CAPSULE | Freq: Two times a day (BID) | ORAL | 0 refills | Status: AC

## 2020-06-17 MED ORDER — CYANOCOBALAMIN 1000 MCG PO TABS: 1000.0000 ug | ORAL_TABLET | Freq: Every day | ORAL | 0 refills | Status: DC

## 2020-06-17 MED ORDER — CEFDINIR 300 MG PO CAPS: 300.0000 mg | ORAL_CAPSULE | Freq: Two times a day (BID) | ORAL | 0 refills | Status: AC

## 2020-06-17 MED ORDER — AMLODIPINE BESYLATE 2.5 MG PO TABS: 2.5000 mg | ORAL_TABLET | Freq: Every day | ORAL | 0 refills | Status: DC

## 2020-06-17 MED ORDER — DIVALPROEX SODIUM 500 MG PO DR TAB: 500.0000 mg | DELAYED_RELEASE_TABLET | Freq: Two times a day (BID) | ORAL | 0 refills | Status: AC

## 2020-06-17 NOTE — TOC Transition Note (Signed)
Transition of Care [facility]) - CM/SW Discharge Note   Patient Details  Name: [name] [name] MRN: [id number] Date of Birth: [birth year]  Transition of Care [facility]) CM/SW Contact:  Carles Collet, RN Phone Number: 06/17/2020, 10:53 AM   Clinical Narrative:   Damaris Schooner w patient at bedside. She would like [place] services, discussed medicare ratings. Referral accepted by [facility]. Verified w patient no DME needs. No other CM needs identified.     Final next level of care: [place] Barriers to Discharge: No Barriers Identified   Patient Goals and CMS Choice Patient states their goals for this hospitalization and ongoing recovery are:: to go home CMS Medicare.gov Compare Post Acute Care list provided to:: Patient Choice offered to / list presented to : Patient  Discharge Placement                       Discharge Plan and Services                          HH Arranged: PT [facility] Agency: [place] Date [place]: 06/17/20 Time [place]: 1053 Representative spoke with at [place]: [place] ([place]) Interventions     Readmission Risk Interventions No flowsheet data found.

## 2020-06-17 NOTE — Discharge Summary (Signed)
Physician Discharge Summary  [name] [name] XFG:[id number] DOB: 1941-07-25 DOA: 06/15/2020  PCP: Burnis Medin, MD  Admit date: 06/15/2020 Discharge date: 06/17/2020  Admitted From: Home Discharge disposition: Home with home health PT   Code Status: Full Code  Diet Recommendation: Cardiac diet  Discharge Diagnosis:   Principal Problem:   AMS (altered mental status) Active Problems:   OSA (obstructive sleep apnea)   UTI (urinary tract infection)   Essential hypertension  History of Present Illness / Brief narrative:  Patientis a 79 y.o.femalewith medical history significant forrecent hemorrhagic strokewith cognitive deficits, 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 Patient was brought to the ED on 11/4 by family members for evaluation of continued confusion, agitation and insomnia since her hospital/rehabdischarge.  The day prior on 11/3, per PCP increased her Seroquel to 25mg  twice daily as needed for agitation. She fell asleep for 3 hours but Seroquel but continue to remain confused in the morning and hence she was brought to the ED. Patient has 24-hour supervision/care at home.  In the ED, patient was afebrile, hemodynamically stable. Labs showed creatinine elevated to 1.91, compared to a normal baseline, CBC unremarkable Respiratory viral panel was negative Urinanalysis shows pyuria with positive nitrite and large leukocyte esterase. CT scan of the head without contrast showed improving hemorrhage in the left temporal subdural. Increased surrounding low-density edema with mild local mass-effect. No midline shift. No new hemorrhage.  Patient was admitted to hospital service for further evaluation management.  Subjective:  Seen and examined this morning.  Pleasant elderly African-American female.  Not in distress.  Remains disoriented to place, person or time but remains calm.  Not restless or  agitated.  Hospital Course:  Acute progressive delirium Acute metabolic encephalopathy New onset seizure -Brought to ED for progressively worsening insomnia, confusion, agitation in the background of recent subdural hematoma. -Delirium worsened by UTI and possible episodes of seizures and postictal state. -EEG showed evidence of potential epileptogenic city arising from the left more than the right frontal region as well as moderate diffuse encephalopathy. -Per neurology recommendation, will start the patient on Depakote 500 mg twice daily. -Mental status is much better.  Will discharge home today.  UTI -Urinanalysis on admission showed pyuria with positive nitrite and large leukocyte esterase. -Currently on IV Rocephin. -Pending urine culture report.  Will discharge on 5 more days of oral Omnicef with probiotics.  Recent hemorrhagic stroke  -CT scan of the head without contrast showsimproving hemorrhage in the left temporal occipital lobe. Increased surrounding low-density edema with mild local mass-effect. No midline shift. No new hemorrhage. -Continue to follow-up with neurosurgery as an outpatient.  Acute kidney injury -Creatinine elevated to 1.91 on admission, likely because of poor oral intake and diuretics use.   -Diuretics was held.  Patient was given IV hydration. -Blood pressure is currently normal without medicine.  She is at significant risk of dehydration on diuretics.  I would avoid diuretics.  Essential hypertension -Currently blood pressure is in normal range without meds.    At home, she was on triamterene/HCTZ.  I would avoid diuretics because of risk of dehydration.  I will start the patient on amlodipine 2.5 mg daily.  Hypothyroidism Continue Synthroid  Stable for discharge to home today with home health PT.   Wound care: Incision (Closed) 10/02/17 Hip Right (Active)  Date First Assessed/Time First Assessed: 10/02/17 1612   Location: Hip  Location  Orientation: Right    Assessments 10/02/2017  4:45 PM 10/03/2017  9:40 AM  Dressing Type Silicone dressing Silicone dressing  Dressing Clean;Dry;Intact Clean;Dry;Intact  Site / Wound Assessment -- Dressing in place / Unable to assess  Drainage Amount None None  Treatment Ice applied --     No Linked orders to display    Discharge Exam:   Vitals:   06/17/20 0300 06/17/20 0400 06/17/20 0442 06/17/20 0752  BP:   (!) 110/45 132/70  Pulse:   64 70  Resp: 16 17 18 18   Temp:   97.8 F (36.6 C) (!) 97 F (36.1 C)  TempSrc:   Oral   SpO2:   98% 97%    There is no height or weight on file to calculate BMI.  General exam: Appears calm and comfortable.  Not in physical distress Skin: No rashes, lesions or ulcers. HEENT: Atraumatic, normocephalic, supple neck, no obvious bleeding Lungs: Clear to auscultation bilaterally CVS: Regular rate and rhythm, no murmur GI/Abd soft, nontender, nondistended, bowel sound present CNS: Alert, awake, disoriented but calm, not restless or agitated Psychiatry: Mood appropriate Extremities: No pedal edema, no calf tenderness  Follow ups:   Discharge Instructions    Diet - low sodium heart healthy   Complete by: As directed    Increase activity slowly   Complete by: As directed       Follow-up Information    Panosh, Standley Brooking, MD Follow up.   Specialties: Internal Medicine, Pediatrics Contact information: [place] [address] [phone]        Minus Breeding, MD .   Specialty: Cardiology Contact information: [address] [place]  [address] [phone]               Recommendations for Outpatient Follow-Up:   1. Follow-up with PCP as an outpatient  Discharge Instructions:  Follow with Primary MD Panosh, Standley Brooking, MD in 7 days   Get CBC/BMP checked in next visit within 1 week by PCP or SNF MD ( we routinely change or add medications that can affect your baseline labs and fluid status,  therefore we recommend that you get the mentioned basic workup next visit with your PCP, your PCP may decide not to get them or add new tests based on their clinical decision)  On your next visit with your PCP, please Get Medicines reviewed and adjusted.  Please request your PCP  to go over all Hospital Tests and Procedure/Radiological results at the follow up, please get all Hospital records sent to your Prim MD by signing hospital release before you go home.  Activity: As tolerated with Full fall precautions use walker/cane & assistance as needed  For Heart failure patients - Check your Weight same time everyday, if you gain over 2 pounds, or you develop in leg swelling, experience more shortness of breath or chest pain, call your Primary MD immediately. Follow Cardiac Low Salt Diet and 1.5 lit/day fluid restriction.  If you have smoked or chewed Tobacco in the last 2 yrs please stop smoking, stop any regular Alcohol  and or any Recreational drug use.  If you experience worsening of your admission symptoms, develop shortness of breath, life threatening emergency, suicidal or homicidal thoughts you must seek medical attention immediately by calling 911 or calling your MD immediately  if symptoms less severe.  You Must read complete instructions/literature along with all the possible adverse reactions/side effects for all the Medicines you take and that have been prescribed to you. Take any new Medicines after you have completely understood and accpet all  the possible adverse reactions/side effects.   Do not drive, operate heavy machinery, perform activities at heights, swimming or participation in water activities or provide baby sitting services if your were admitted for syncope or siezures until you have seen by Primary MD or a Neurologist and advised to do so again.  Do not drive when taking Pain medications.  Do not take more than prescribed Pain, Sleep and Anxiety Medications  Wear Seat  belts while driving.   Please note You were cared for by a hospitalist during your hospital stay. If you have any questions about your discharge medications or the care you received while you were in the hospital after you are discharged, you can call the unit and asked to speak with the hospitalist on call if the hospitalist that took care of you is not available. Once you are discharged, your primary care physician will handle any further medical issues. Please note that NO REFILLS for any discharge medications will be authorized once you are discharged, as it is imperative that you return to your primary care physician (or establish a relationship with a primary care physician if you do not have one) for your aftercare needs so that they can reassess your need for medications and monitor your lab values.    Allergies as of 06/17/2020      Reactions   Lisinopril    REACTION: COUGH   Lyrica [pregabalin] Other (See Comments)   Cns side efffects   Pravastatin Other (See Comments)   Cramps.   Simvastatin    Myalgia and stiffness   Codeine Phosphate    REACTION: itching   Oxycodone Hcl    REACTION: rash   Strawberry Extract       Medication List    STOP taking these medications   hydrochlorothiazide 12.5 MG capsule Commonly known as: MICROZIDE   triamterene-hydrochlorothiazide 37.5-25 MG tablet Commonly known as: MAXZIDE-25     TAKE these medications   acetaminophen 500 MG tablet Commonly known as: TYLENOL Take 1,000 mg by mouth every 6 (six) hours as needed for mild pain, moderate pain or headache.   amLODipine 2.5 MG tablet Commonly known as: NORVASC Take 1 tablet (2.5 mg total) by mouth daily. What changed:   medication strength  how much to take   cefdinir 300 MG capsule Commonly known as: OMNICEF Take 1 capsule (300 mg total) by mouth 2 (two) times daily for 5 days.   cyanocobalamin 1000 MCG tablet Take 1 tablet (1,000 mcg total) by mouth daily. Start taking  on: June 18, 2020 What changed:   medication strength  how much to take  when to take this  Another medication with the same name was removed. Continue taking this medication, and follow the directions you see here.   divalproex 500 MG DR tablet Commonly known as: DEPAKOTE Take 1 tablet (500 mg total) by mouth every 12 (twelve) hours.   famotidine 20 MG tablet Commonly known as: PEPCID Take 1 tablet (20 mg total) by mouth 2 (two) times daily.   fluticasone 50 MCG/ACT nasal spray Commonly known as: FLONASE Place 2 sprays into both nostrils as needed for allergies. FOR RHINITIS What changed: when to take this   melatonin 3 MG Tabs tablet Take 6 mg by mouth at bedtime.   QUEtiapine 25 MG tablet Commonly known as: SEROQUEL Take 12.5 mg by mouth at bedtime. May take an additional 1/2 tablet 2 times daily as needed for agitation   saccharomyces boulardii 250 MG capsule Commonly  known as: FLORASTOR Take 1 capsule (250 mg total) by mouth 2 (two) times daily for 5 days.   sennosides-docusate sodium 8.6-50 MG tablet Commonly known as: SENOKOT-S Take 1 tablet by mouth 2 (two) times daily as needed for constipation.   Synthroid 100 MCG tablet Generic drug: levothyroxine TAKE 1 TABLET BY MOUTH EVERY DAY What changed:   how much to take  when to take this   thiamine 100 MG tablet Take 100 mg by mouth daily.       Time coordinating discharge: 35 minutes  The results of significant diagnostics from this hospitalization (including imaging, microbiology, ancillary and laboratory) are listed below for reference.    Procedures and Diagnostic Studies:   CT HEAD WO CONTRAST  Result Date: 06/15/2020 CLINICAL DATA:  Follow-up intracranial hemorrhage.  Stroke. EXAM: CT HEAD WITHOUT CONTRAST TECHNIQUE: Contiguous axial images were obtained from the base of the skull through the vertex without intravenous contrast. COMPARISON:  CT head 05/25/2020 FINDINGS: Brain: Interval  improvement in high-density hemorrhage in the left occipital temporal lobe. Increased surrounding hypodensity. The area of hypodensity corresponds to the left hippocampus. No new area of hemorrhage. Negative for hydrocephalus. Vascular: Negative for hyperdense vessel Skull: Negative Sinuses/Orbits: Paranasal sinuses clear. Bilateral cataract extraction. Other: None IMPRESSION: Improving hemorrhage in the left temporal occipital lobe. Increased surrounding low-density edema with mild local mass-effect. No midline shift. No new hemorrhage. Electronically Signed   By: Franchot Gallo M.D.   On: 06/15/2020 14:43   EEG adult  Result Date: 06/16/2020 Lora Havens, MD     06/16/2020 12:30 PM Patient Name: [name] [name] MRN: [id number] Epilepsy Attending: Lora Havens Referring Physician/Provider: Dr Collier Bullock Date: 06/16/2020 Duration: 27.25mins Patient history: 79yo F with L temporal ICH and ams. EEG to evaluate for seizure. Level of alertness: Awake, asleep AEDs during EEG study: None Technical aspects: This EEG study was done with scalp electrodes positioned according to the 10-20 International system of electrode placement. Electrical activity was acquired at a sampling rate of 500Hz  and reviewed with a high frequency filter of 70Hz  and a low frequency filter of 1Hz . EEG data were recorded continuously and digitally stored. Description: No posterior dominant rhythm was seen. Sleep was characterized by sleep spindles (12 to 14 Hz), maximal frontocentral region. Sharp waves were noted in bifrontal L>R region which at times were rhythmic lasting about 3-4 seconds without any evolution. EEG also showed continuous generalized 3 to 6 Hz theta-delta slowing. Hyperventilation and photic stimulation were not performed.   ABNORMALITY -Sharp waves, L>R frontal region -Continuous slow, generalized IMPRESSION: This study showed evidence of potential epileptogenicity arising from left more than right frontal  region as well as moderate diffuse encephalopathy, non specific to to etiology. No seizures were seen throughout the recording. [place]:   Basic Metabolic Panel: Recent Labs  Lab 06/15/20 1255 06/16/20 0209  NA 139 139  K 3.7 3.7  CL 100 105  CO2 23 24  GLUCOSE 113* 104*  BUN 36* 30*  CREATININE 1.91* 1.34*  CALCIUM 10.4* 9.3   GFR Estimated Creatinine Clearance: 36.7 mL/min (A) (by C-G formula based on SCr of 1.34 mg/dL (H)). Liver Function Tests: Recent Labs  Lab 06/15/20 1255  AST 28  ALT 19  ALKPHOS 47  BILITOT 0.4  PROT 8.0  ALBUMIN 4.3   No results for input(s): LIPASE, AMYLASE in the last 168 hours. Recent Labs  Lab 06/17/20 0816  AMMONIA 11  Coagulation profile Recent Labs  Lab 06/15/20 1849  INR 1.1    CBC: Recent Labs  Lab 06/15/20 1255 06/16/20 0209  WBC 5.6 5.9  NEUTROABS 2.5  --   HGB 13.6 11.0*  HCT 44.6 35.0*  MCV 96.7 92.6  PLT 240 245   Cardiac Enzymes: No results for input(s): CKTOTAL, CKMB, CKMBINDEX, TROPONINI in the last 168 hours. BNP: Invalid input(s): POCBNP CBG: No results for input(s): GLUCAP in the last 168 hours. D-Dimer No results for input(s): DDIMER in the last 72 hours. Hgb A1c No results for input(s): HGBA1C in the last 72 hours. Lipid Profile No results for input(s): CHOL, HDL, LDLCALC, TRIG, CHOLHDL, LDLDIRECT in the last 72 hours. Thyroid function studies Recent Labs    06/17/20 0816  TSH 0.042*   Anemia work up Recent Labs    06/17/20 0816  VITAMINB12 1,104*   Microbiology Recent Results (from the past 240 hour(s))  Urine culture     Status: None   Collection Time: 06/15/20  1:17 PM   Specimen: Urine, Random  Result Value Ref Range Status   Specimen Description URINE, RANDOM  Final   Special Requests NONE  Final   Culture   Final    NO GROWTH Performed at [facility], [address] [place],  [address]    Report Status 06/17/2020 FINAL  Final   Respiratory Panel by RT PCR (Flu A&B, Covid) - Nasopharyngeal Swab     Status: None   Collection Time: 06/15/20  2:12 PM   Specimen: Nasopharyngeal Swab  Result Value Ref Range Status   SARS Coronavirus 2 by RT PCR NEGATIVE NEGATIVE Final    Comment: (NOTE) SARS-CoV-2 target nucleic acids are NOT DETECTED.  The SARS-CoV-2 RNA is generally detectable in upper respiratoy specimens during the acute phase of infection. The lowest concentration of SARS-CoV-2 viral copies this assay can detect is 131 copies/mL. A negative result does not preclude SARS-Cov-2 infection and should not be used as the sole basis for treatment or other patient management decisions. A negative result may occur with  improper specimen collection/handling, submission of specimen other than nasopharyngeal swab, presence of viral mutation(s) within the areas targeted by this assay, and inadequate number of viral copies (<131 copies/mL). A negative result must be combined with clinical observations, patient history, and epidemiological information. The expected result is Negative.  Fact Sheet for Patients:  PinkCheek.be  Fact Sheet for Healthcare Providers:  GravelBags.it  This test is no t yet approved or cleared by the [place] FDA and  has been authorized for detection and/or diagnosis of SARS-CoV-2 by FDA under an Emergency Use Authorization (EUA). This EUA will remain  in effect (meaning this test can be used) for the duration of the COVID-19 declaration under Section 564(b)(1) of the Act, 21 U.S.C. section 360bbb-3(b)(1), unless the authorization is terminated or revoked sooner.     Influenza A by PCR NEGATIVE NEGATIVE Final   Influenza B by PCR NEGATIVE NEGATIVE Final    Comment: (NOTE) The Xpert Xpress SARS-CoV-2/FLU/RSV assay is intended as an aid in  the diagnosis of influenza from Nasopharyngeal swab specimens and  should not be used as  a sole basis for treatment. Nasal washings and  aspirates are unacceptable for Xpert Xpress SARS-CoV-2/FLU/RSV  testing.  Fact Sheet for Patients: PinkCheek.be  Fact Sheet for Healthcare Providers: GravelBags.it  This test is not yet approved or cleared by the [place] FDA and  has been authorized for detection and/or diagnosis  of SARS-CoV-2 by  FDA under an Emergency Use Authorization (EUA). This EUA will remain  in effect (meaning this test can be used) for the duration of the  Covid-19 declaration under Section 564(b)(1) of the Act, 21  U.S.C. section 360bbb-3(b)(1), unless the authorization is  terminated or revoked. Performed at [facility], [address], [place], [place] [address]      Signed: Terrilee Croak  [facility] 06/17/2020, 10:14 AM

## 2020-06-17 NOTE — Discharge Instructions (Signed)
Delirium Delirium is a state of mental confusion. It comes on quickly and causes significant changes in a person's thinking and behavior. People with delirium usually have trouble paying attention to what is going on or knowing where they are. They may become very withdrawn or very emotional and unable to sit still. They may even see or feel things that are not there (hallucinations). Delirium is a sign of a serious underlying medical condition. What are the causes? Delirium occurs when something suddenly affects the signals that the brain sends out. Brain signals can be affected by anything that puts severe stress on the body and brain and causes brain chemicals to be out of balance. The most common causes of delirium include:  Infections. These may be bacterial, viral, fungal, or protozoal.  Medicines. These include many over-the-counter and prescription medicines.  Recreational drugs.  Substance withdrawal. This occurs with sudden discontinuation of alcohol, certain medicines, or recreational drugs.  Surgery and anesthesia.  Sudden vascular events, such as stroke and brain hemorrhage.  Other brain disorders, such as migraines, tumors, seizures, and physical head trauma.  Metabolic disorders, such as kidney or liver failure.  Low blood oxygen (anoxia). This may occur with lung disease, cardiac arrest, or carbon monoxide poisoning.  Hormone imbalances (endocrinopathies), such as an overactive thyroid (hyperthyroidism) or underactive thyroid (hypothyroidism).  Vitamin deficiencies. What increases the risk? The following factors may make someone more likely to develop this condition.  Being a child.  Being an older person.  Living alone.  Having vision loss or hearing loss.  Having an existing brain disease, such as dementia.  Having long-lasting (chronic) medical conditions, such as heart disease.  Being hospitalized for long periods of time. What are the signs or  symptoms? Delirium starts with a sudden change in a person's thinking or behavior. Symptoms include:  Not being able to stay awake (drowsiness) or pay attention.  Being confused about places, time, and people.  Forgetfulness.  Having extreme energy levels. These may be low or high.  Changes in sleep patterns.  Extreme mood swings, such as sudden anger or anxiety.  Focusing on things or ideas that are not important.  Rambling and senseless talking.  Difficulty speaking, understanding speech, or both.  Hallucinations.  Tremor or unsteady gait. Symptoms come and go (fluctuate) over time, and they are often worse at the end of the day. How is this diagnosed? People with delirium may not realize that they have the condition. Often, a family member or health care provider is the first person to notice the changes. This condition may be diagnosed based on a physical exam, health history, and tests.  The health care provider will obtain a detailed history. This may include questions about: ? Current symptoms. ? Medical issues. ? Medicines. ? Recreational drug use.  The health care provider will perform a mental status examination by: ? Asking questions to check for confusion. ? Watching for abnormal behavior.  The health care provider may also order lab tests or additional studies to determine the cause of the delirium. How is this treated? Treatment of delirium depends on the cause and severity. Delirium usually goes away within days or weeks of treating the underlying cause. In the meantime, do not leave the person alone because he or she may accidentally cause self-harm. This condition may be treated with supportive care, such as:  Increased light during the day and decreased light at night.  Low noise level.  Uninterrupted sleep.  A regular daily schedule.    Clocks and calendars to help with orientation.  Familiar objects, including the person's pictures and  clothing.  Frequent visits from familiar family and friends.  A healthy diet.  Gentle exercise. In more severe cases of delirium, medicine may be prescribed to help the person keep calm and think more clearly. Follow these instructions at home:  Continue supportive care as told by a health care provider.  Over-the-counter and prescription medicines should be taken only as told by a health care provider.  Ask a health care provider before using herbs or supplements.  Do not use alcohol or recreational drugs.  Keep all follow-up visits as told by a health care provider. This is important. Contact a health care provider if:  Symptoms do not get better or they become worse.  New symptoms of delirium develop.  Caring for the person at home does not seem safe.  Eating, drinking, or communicating stops.  There are side effects of medicines, such as changes in sleep patterns, dizziness, weight gain, restlessness, movement changes, or tremors. Get help right away if:  Serious thoughts occur about self-harm or about hurting others.  There are serious side effects of medicine, such as: ? Swelling of the face, lips, tongue, or throat. ? Fever, confusion, muscle spasms, or seizures. Summary  Delirium is a state of mental confusion. It comes on quickly and causes significant changes in a person's thinking and behavior.  Delirium is a sign of a serious underlying medical condition.  Certain medical conditions or a long hospital stay may increase the risk of developing delirium.  Treatment of delirium involves treating the underlying cause and providing supportive treatments, such as a calm and familiar environment. This information is not intended to replace advice given to you by your health care provider. Make sure you discuss any questions you have with your health care provider. Document Revised: 03/19/2018 Document Reviewed: 03/19/2018 Elsevier Patient Education  2020 Elsevier  Inc.  

## 2020-06-17 NOTE — Progress Notes (Addendum)
Pt's IV removed; site is clean, dry and intact. Discharge instructions were reviewed with pt. and son, Hassell Done; voiced understanding. Personal belongings were packed by son and staff and sent with pt. Pt. transported via wheelchair to private vehicle driven by son.

## 2020-06-19 ENCOUNTER — Other Ambulatory Visit: Payer: Self-pay

## 2020-06-19 ENCOUNTER — Telehealth: Payer: Self-pay | Admitting: Internal Medicine

## 2020-06-19 ENCOUNTER — Encounter: Payer: Self-pay | Admitting: Primary Care

## 2020-06-19 ENCOUNTER — Ambulatory Visit (INDEPENDENT_AMBULATORY_CARE_PROVIDER_SITE_OTHER): Payer: Medicare Other | Admitting: Primary Care

## 2020-06-19 VITALS — BP 138/78 | Temp 98.5°F | Ht 65.0 in | Wt 185.0 lb

## 2020-06-19 DIAGNOSIS — G4733 Obstructive sleep apnea (adult) (pediatric): Secondary | ICD-10-CM

## 2020-06-19 NOTE — Progress Notes (Signed)
@Patient  ID: [name] [name], female    DOB: March 27, 1941, 79 y.o.   MRN: [id number]  Chief Complaint  Patient presents with   Follow-up    reports breathing "okay" and at baseline. son in room reports decreased use of CPAP recently since patient has been experiencing altered mentation.    Referring provider: Burnis Medin, MD  HPI: 79 year old female.  Past medical history significant for daytime sleepiness, headaches, obstructive sleep apnea.  Patient of Dr. Ander Slade, seen for initial consult on 01/18/2020 (former patient of Dr. Gwenette Greet last seen in 2014). Sleep study in 2013 showed mild OSA. Ordered for repeat HST. [place] 11/24.   Previous [facility] pulmonary encounter:  03/20/2020 Patient presents today for 53-month follow-up. Repeat home sleep study on 03/01/20 showed moderate OSA with AHI18.3/hr. SpO2 low 83% RA. She reports waking up short of breath, snoring and sleep distruption. She has been sleeping with her head of bed elevated with 2 pillows at night. She recently had one episode where she reports waking up short of breath. She mostly lays on her right side. She reports loud snoring and waking up multiple times.   06/19/2020- Interim hx Patient presents today for CPAP compliance check. She has had three hospital admissions since August for confusion/altered mental status, hemorrhagic stroke and poststroke aphasia. Delirium worsened by UTI and possible episodes of seizures and postictal state. Neurology started her on Depakote 500mg  twice daily. UTI treated with IV rocephin and oral omniceft. CT head showed improving hemorrhage in the left temporal occiptial lobe. Increased surrounding low density edema with mild loca mass effect. No midline shift. Continue to follow with neurosurgery out patient. Patient was BP has been in normal range, stopped HCTZ and placed on amlodipine 2.5mg  daily.   Accompanied by family member. D/t being in the hospital several times this past month she has not been  able to wear CPAP. Patient received CPAP machine on 05/15/20. She is prescribed Seroquel 12.5mg  at bedtime, with additional 12.5mg  twice daily as needed for agitation. She has only been able to wear CPAP a handful of times d/t confusion but when she did use it it seemed to help. She is starting OT and speech therapy tomorrow. Denies f/c/s, shortness of breath, chest tightness/wheezing or difficulties swallowing.   Airview download 04/25/20-05/25/20 Usage 1/30 days  Usage hours days used 2 hours 57 mins Pressure 6-20cm h20 Airleak 7L/min (95%) AHI 0.0   Allergies  Allergen Reactions   Lisinopril     REACTION: COUGH   Lyrica [Pregabalin] Other (See Comments)    Cns side efffects   Pravastatin Other (See Comments)    Cramps.   Simvastatin     Myalgia and stiffness   Codeine Phosphate     REACTION: itching   Oxycodone Hcl     REACTION: rash   Strawberry Extract     Immunization History  Administered Date(s) Administered   Fluad Quad(high Dose 65+) 04/20/2019   Influenza Split 06/20/2011, 06/23/2012   Influenza Whole 05/17/2008, 06/11/2010   Influenza, High Dose Seasonal PF 05/18/2015, 09/05/2016, 06/05/2017, 04/14/2018, 05/12/2020   Influenza,inj,Quad PF,6+ Mos 04/08/2013, 05/25/2014   Influenza-Unspecified 05/12/2017   PFIZER SARS-COV-2 Vaccination 10/07/2019, 11/02/2019, 05/12/2020   Pneumococcal Conjugate-13 11/05/2013   Pneumococcal Polysaccharide-23 02/05/2008   Td 08/12/2004   Tdap 06/23/2012   Zoster 03/28/2011    Past Medical History:  Diagnosis Date   Adenomatous colon polyp    Anxiety    Cataract 2013   bilat removed    DJD (degenerative joint  disease) of lumbar spine    Estrogen deficiency    Gastritis    GERD (gastroesophageal reflux disease)    VELFYBOF(751.0)    Helicobacter pylori infection 10/13/2007   Qualifier: Diagnosis of  By: Marland Mcalpine     History of positive PPD 10/16/2011   HSV-2 infection     Hyperglycemia    Hyperlipidemia    no meds now   Hypertension    IBS (irritable bowel syndrome)    Medication side effect - Propofol (burning vein) and prvastatin (myalgia) 04/08/2013   Pravastatin patient went off and rechallenged and symptoms recurred muscle cramps /foot cramps    Neuromuscular disorder (HCC)    muscle cramps   Pulsatile tinnitus    had MRA and MPV nl mild carotid [id number]   Shingles 02/09/2011   Right upper face and eyelid but not involving the eye at this point.    Sleep apnea    does not use c pap   Thyroid disease    Vulvitis     Tobacco History: Social History   Tobacco Use  Smoking Status Former Smoker   Packs/day: 0.50   Years: 23.00   Pack years: 11.50   Types: Cigarettes   Quit date: 06/16/1984   Years since quitting: 36.0  Smokeless Tobacco Never Used   Counseling given: Not Answered   Outpatient Medications Prior to Visit  Medication Sig Dispense Refill   acetaminophen (TYLENOL) 500 MG tablet Take 1,000 mg by mouth every 6 (six) hours as needed for mild pain, moderate pain or headache.      amLODipine (NORVASC) 2.5 MG tablet Take 1 tablet (2.5 mg total) by mouth daily. 90 tablet 0   cefdinir (OMNICEF) 300 MG capsule Take 1 capsule (300 mg total) by mouth 2 (two) times daily for 5 days. 10 capsule 0   divalproex (DEPAKOTE) 500 MG DR tablet Take 1 tablet (500 mg total) by mouth every 12 (twelve) hours. 180 tablet 0   famotidine (PEPCID) 20 MG tablet Take 1 tablet (20 mg total) by mouth 2 (two) times daily. 180 tablet 1   fluticasone (FLONASE) 50 MCG/ACT nasal spray Place 2 sprays into both nostrils as needed for allergies. FOR RHINITIS (Patient taking differently: Place 2 sprays into both nostrils daily as needed for allergies. FOR RHINITIS) 16 mL 5   melatonin 3 MG TABS tablet Take 6 mg by mouth at bedtime.      QUEtiapine (SEROQUEL) 25 MG tablet Take 12.5 mg by mouth at bedtime. May take an additional 1/2 tablet 2  times daily as needed for agitation     saccharomyces boulardii (FLORASTOR) 250 MG capsule Take 1 capsule (250 mg total) by mouth 2 (two) times daily for 5 days. 10 capsule 0   sennosides-docusate sodium (SENOKOT-S) 8.6-50 MG tablet Take 1 tablet by mouth 2 (two) times daily as needed for constipation.     SYNTHROID 100 MCG tablet TAKE 1 TABLET BY MOUTH EVERY DAY (Patient taking differently: Take 100 mcg by mouth daily before breakfast. ) 90 tablet 1   thiamine 100 MG tablet Take 100 mg by mouth daily.     vitamin B-12 1000 MCG tablet Take 1 tablet (1,000 mcg total) by mouth daily. 90 tablet 0   No facility-administered medications prior to visit.    Review of Systems  Review of Systems  Constitutional: Positive for fatigue. Negative for chills and unexpected weight change.  HENT: Negative.   Respiratory: Negative.   Cardiovascular: Negative.    Physical  Exam  BP 138/78 (BP Location: Left Arm)    Temp 98.5 F (36.9 C)    Ht 5\' 5"  (1.651 m)    Wt 185 lb (83.9 kg)    SpO2 99%    BMI 30.79 kg/m  Physical Exam Constitutional:      Appearance: Normal appearance. She is not ill-appearing.  HENT:     Mouth/Throat:     Mouth: Mucous membranes are moist.     Pharynx: Oropharynx is clear.  Cardiovascular:     Rate and Rhythm: Normal rate and regular rhythm.  Pulmonary:     Effort: Pulmonary effort is normal.     Breath sounds: Normal breath sounds.  Musculoskeletal:        General: Normal range of motion.  Skin:    General: Skin is warm and dry.  Neurological:     General: No focal deficit present.     Mental Status: She is alert.     Motor: Weakness present.  Psychiatric:        Mood and Affect: Mood normal.        Behavior: Behavior normal.      Lab Results:  CBC    Component Value Date/Time   WBC 5.9 06/16/2020 0209   RBC 3.78 (L) 06/16/2020 0209   HGB 11.0 (L) 06/16/2020 0209   HCT 35.0 (L) 06/16/2020 0209   PLT 245 06/16/2020 0209   MCV 92.6 06/16/2020 0209     MCH 29.1 06/16/2020 0209   MCHC 31.4 06/16/2020 0209   RDW 12.8 06/16/2020 0209   LYMPHSABS 2.3 06/15/2020 1255   MONOABS 0.6 06/15/2020 1255   EOSABS 0.1 06/15/2020 1255   BASOSABS 0.0 06/15/2020 1255    BMET    Component Value Date/Time   NA 139 06/16/2020 0209   K 3.7 06/16/2020 0209   CL 105 06/16/2020 0209   CO2 24 06/16/2020 0209   GLUCOSE 104 (H) 06/16/2020 0209   BUN 30 (H) 06/16/2020 0209   CREATININE 1.34 (H) 06/16/2020 0209   CALCIUM 9.3 06/16/2020 0209   GFRNONAA 41 (L) 06/16/2020 0209   GFRAA >60 11/24/2018 1315    BNP No results found for: BNP  ProBNP No results found for: PROBNP  Imaging: CT HEAD WO CONTRAST  Result Date: 06/15/2020 CLINICAL DATA:  Follow-up intracranial hemorrhage.  Stroke. EXAM: CT HEAD WITHOUT CONTRAST TECHNIQUE: Contiguous axial images were obtained from the base of the skull through the vertex without intravenous contrast. COMPARISON:  CT head 05/25/2020 FINDINGS: Brain: Interval improvement in high-density hemorrhage in the left occipital temporal lobe. Increased surrounding hypodensity. The area of hypodensity corresponds to the left hippocampus. No new area of hemorrhage. Negative for hydrocephalus. Vascular: Negative for hyperdense vessel Skull: Negative Sinuses/Orbits: Paranasal sinuses clear. Bilateral cataract extraction. Other: None IMPRESSION: Improving hemorrhage in the left temporal occipital lobe. Increased surrounding low-density edema with mild local mass-effect. No midline shift. No new hemorrhage. Electronically Signed   By: Franchot Gallo M.D.   On: 06/15/2020 14:43   EEG adult  Result Date: 06/16/2020 Lora Havens, MD     06/16/2020 12:30 PM Patient Name: GREY SCHLAUCH MRN: [id number] Epilepsy Attending: Lora Havens Referring Physician/Provider: Dr Collier Bullock Date: 06/16/2020 Duration: 27.74mins Patient history: 79yo F with L temporal ICH and ams. EEG to evaluate for seizure. Level of alertness: Awake,  asleep AEDs during EEG study: None Technical aspects: This EEG study was done with scalp electrodes positioned according to the 10-20 International system of electrode placement. Dealer  activity was acquired at a sampling rate of 500Hz  and reviewed with a high frequency filter of 70Hz  and a low frequency filter of 1Hz . EEG data were recorded continuously and digitally stored. Description: No posterior dominant rhythm was seen. Sleep was characterized by sleep spindles (12 to 14 Hz), maximal frontocentral region. Sharp waves were noted in bifrontal L>R region which at times were rhythmic lasting about 3-4 seconds without any evolution. EEG also showed continuous generalized 3 to 6 Hz theta-delta slowing. Hyperventilation and photic stimulation were not performed.   ABNORMALITY -Sharp waves, L>R frontal region -Continuous slow, generalized IMPRESSION: This study showed evidence of potential epileptogenicity arising from left more than right frontal region as well as moderate diffuse encephalopathy, non specific to to etiology. No seizures were seen throughout the recording. [place]:   OSA (obstructive sleep apnea) Home sleep study on 03/01/20 showed moderate OSA with AHI18.3/hr. Patient received new CPAP on 05/15/20, she has not been able to wear d/t recent stroke. Plans to start therapy. Encourage patient aim to wear 4-6 hours each night. Recommend lowering pressure to 5-15cm h20 auto titrate. Continue Seroquel 12.5mg  at bedtime. FU in 4 weeks [place], NP 06/19/2020

## 2020-06-19 NOTE — Telephone Encounter (Signed)
Mearl Latin w/[facility] is calling to see if Dr Regis Bill would follow the orders for home health (pt was discharged on 06/17/2020 from the hospital).

## 2020-06-19 NOTE — Telephone Encounter (Signed)
Transition Care Management Unsuccessful Follow-up Telephone Call  Date of discharge and from where:  06/17/2020 from [name] [name]   Attempts:  1st Attempt  Reason for unsuccessful TCM follow-up call:  Unable to leave message

## 2020-06-19 NOTE — Telephone Encounter (Signed)
I will ok orders from home health ( I saw her last week)

## 2020-06-19 NOTE — Telephone Encounter (Signed)
Spoke with [name] [name], she is aware of message below.

## 2020-06-19 NOTE — Assessment & Plan Note (Addendum)
Home sleep study on 03/01/20 showed moderate OSA with AHI18.3/hr. Patient received new CPAP on 05/15/20, she has not been able to wear d/t recent stroke. Plans to start therapy. Encourage patient aim to wear 4-6 hours each night. Recommend lowering pressure to 5-15cm h20 auto titrate. Continue Seroquel 12.5mg  at bedtime. FU in 4 weeks OV/televisit

## 2020-06-19 NOTE — Patient Instructions (Addendum)
Recommendations: Resume CPAP use, aim to wear 4-6 hours or MORE each night Do not drive if experiencing excessive daytime fatigue or somnolence Continue Seroquel 12.5mg  at bedtime and twice daily as needed for agitation  Orders: Change pressure 5-15cm h20   Follow-up: 4 weeks with Dr. Ander Slade or Eustaquio Maize NP (televisit ok if family would like)   CPAP and BPAP Information CPAP and BPAP are methods of helping a person breathe with the use of air pressure. CPAP stands for "continuous positive airway pressure." BPAP stands for "bi-level positive airway pressure." In both methods, air is blown through your nose or mouth and into your air passages to help you breathe well. CPAP and BPAP use different amounts of pressure to blow air. With CPAP, the amount of pressure stays the same while you breathe in and out. With BPAP, the amount of pressure is increased when you breathe in (inhale) so that you can take larger breaths. Your health care provider will recommend whether CPAP or BPAP would be more helpful for you. Why are CPAP and BPAP treatments used? CPAP or BPAP can be helpful if you have:  Sleep apnea.  Chronic obstructive pulmonary disease (COPD).  Heart failure.  Medical conditions that weaken the muscles of the chest including muscular dystrophy, or neurological diseases such as amyotrophic lateral sclerosis (ALS).  Other problems that cause breathing to be weak, abnormal, or difficult. CPAP is most commonly used for obstructive sleep apnea (OSA) to keep the airways from collapsing when the muscles relax during sleep. How is CPAP or BPAP administered? Both CPAP and BPAP are provided by a small machine with a flexible plastic tube that attaches to a plastic mask. You wear the mask. Air is blown through the mask into your nose or mouth. The amount of pressure that is used to blow the air can be adjusted on the machine. Your health care provider will determine the pressure setting that should be  used based on your individual needs. When should CPAP or BPAP be used? In most cases, the mask only needs to be worn during sleep. Generally, the mask needs to be worn throughout the night and during any daytime naps. People with certain medical conditions may also need to wear the mask at other times when they are awake. Follow instructions from your health care provider about when to use the machine. What are some tips for using the mask?   Because the mask needs to be snug, some people feel trapped or closed-in (claustrophobic) when first using the mask. If you feel this way, you may need to get used to the mask. One way to do this is by holding the mask loosely over your nose or mouth and then gradually applying the mask more snugly. You can also gradually increase the amount of time that you use the mask.  Masks are available in various types and sizes. Some fit over your mouth and nose while others fit over just your nose. If your mask does not fit well, talk with your health care provider about getting a different one.  If you are using a mask that fits over your nose and you tend to breathe through your mouth, a chin strap may be applied to help keep your mouth closed.  The CPAP and BPAP machines have alarms that may sound if the mask comes off or develops a leak.  If you have trouble with the mask, it is very important that you talk with your health care provider about  finding a way to make the mask easier to tolerate. Do not stop using the mask. Stopping the use of the mask could have a negative impact on your health. What are some tips for using the machine?  Place your CPAP or BPAP machine on a secure table or stand near an electrical outlet.  Know where the on/off switch is located on the machine.  Follow instructions from your health care provider about how to set the pressure on your machine and when you should use it.  Do not eat or drink while the CPAP or BPAP machine is on.  Food or fluids could get pushed into your lungs by the pressure of the CPAP or BPAP.  Do not smoke. Tobacco smoke residue can damage the machine.  For home use, CPAP and BPAP machines can be rented or purchased through home health care companies. Many different brands of machines are available. Renting a machine before purchasing may help you find out which particular machine works well for you.  Keep the CPAP or BPAP machine and attachments clean. Ask your health care provider for specific instructions. Get help right away if:  You have redness or open areas around your nose or mouth where the mask fits.  You have trouble using the CPAP or BPAP machine.  You cannot tolerate wearing the CPAP or BPAP mask.  You have pain, discomfort, and bloating in your abdomen. Summary  CPAP and BPAP are methods of helping a person breathe with the use of air pressure.  Both CPAP and BPAP are provided by a small machine with a flexible plastic tube that attaches to a plastic mask.  If you have trouble with the mask, it is very important that you talk with your health care provider about finding a way to make the mask easier to tolerate. This information is not intended to replace advice given to you by your health care provider. Make sure you discuss any questions you have with your health care provider. Document Revised: 11/18/2018 Document Reviewed: 06/17/2016 Elsevier Patient Education  [place].

## 2020-06-20 ENCOUNTER — Ambulatory Visit: Payer: Medicare Other

## 2020-06-20 ENCOUNTER — Telehealth: Payer: Self-pay

## 2020-06-20 ENCOUNTER — Ambulatory Visit: Payer: Medicare Other | Attending: Physical Medicine & Rehabilitation

## 2020-06-20 ENCOUNTER — Telehealth: Payer: Self-pay | Admitting: Internal Medicine

## 2020-06-20 DIAGNOSIS — R2681 Unsteadiness on feet: Secondary | ICD-10-CM | POA: Diagnosis not present

## 2020-06-20 DIAGNOSIS — M6281 Muscle weakness (generalized): Secondary | ICD-10-CM | POA: Diagnosis not present

## 2020-06-20 DIAGNOSIS — R2689 Other abnormalities of gait and mobility: Secondary | ICD-10-CM

## 2020-06-20 DIAGNOSIS — R4701 Aphasia: Secondary | ICD-10-CM | POA: Insufficient documentation

## 2020-06-20 DIAGNOSIS — R41841 Cognitive communication deficit: Secondary | ICD-10-CM | POA: Insufficient documentation

## 2020-06-20 DIAGNOSIS — R262 Difficulty in walking, not elsewhere classified: Secondary | ICD-10-CM | POA: Insufficient documentation

## 2020-06-20 DIAGNOSIS — I619 Nontraumatic intracerebral hemorrhage, unspecified: Secondary | ICD-10-CM

## 2020-06-20 NOTE — Addendum Note (Signed)
Addended by: Rodrigo Ran on: 06/20/2020 02:44 PM   Modules accepted: Orders

## 2020-06-20 NOTE — Telephone Encounter (Signed)
Yes  Please ok referral   For s/p hemorrhagic stroke

## 2020-06-20 NOTE — Telephone Encounter (Signed)
Referral has been placed. 

## 2020-06-20 NOTE — Telephone Encounter (Signed)
Spoke with pt's son, he is aware. He asked for a new neurology referral due to [facility] being pushed so far out. Referral entered.

## 2020-06-20 NOTE — Therapy (Signed)
[facility] [address] [place], [place], [address] Phone: [phone]   Fax:  [phone]  Speech Language Pathology Evaluation  Patient Details  Name: [name] [name] MRN: [id number] Date of Birth: Mar 08, 1941 Referring Provider (SLP): Ivar Bury, MD   Encounter Date: 06/20/2020   End of Session - 06/20/20 2250    Visit Number 1    Number of Visits 17    Date for SLP Re-Evaluation 09/18/20    SLP Start Time 1448    SLP Stop Time  2831    SLP Time Calculation (min) 42 min    Activity Tolerance Other (comment)   limited by decr'd attention          Past Medical History:  Diagnosis Date   Adenomatous colon polyp    Anxiety    Cataract 2013   bilat removed    DJD (degenerative joint disease) of lumbar spine    Estrogen deficiency    Gastritis    GERD (gastroesophageal reflux disease)    DVVOHYWV(371.0)    Helicobacter pylori infection 10/13/2007   Qualifier: Diagnosis of  By: Marland Mcalpine     History of positive PPD 10/16/2011   HSV-2 infection    Hyperglycemia    Hyperlipidemia    no meds now   Hypertension    IBS (irritable bowel syndrome)    Medication side effect - Propofol (burning vein) and prvastatin (myalgia) 04/08/2013   Pravastatin patient went off and rechallenged and symptoms recurred muscle cramps /foot cramps    Neuromuscular disorder (HCC)    muscle cramps   Pulsatile tinnitus    had MRA and MPV nl mild carotid dopplers2010   Shingles 02/09/2011   Right upper face and eyelid but not involving the eye at this point.    Sleep apnea    does not use c pap   Thyroid disease    Vulvitis     Past Surgical History:  Procedure Laterality Date   ABDOMINAL HYSTERECTOMY  1982   fibroids   COLONOSCOPY  2003- 2102   adenomatous polyps   FINGER SURGERY     left middle A1pulley release   isthmuectomy     thyroid surgery   LUMBAR SPINE SURGERY  05/1989    spine d/t tumor neurofibroma Quentin [facility]   POLYPECTOMY     SHOULDER ARTHROSCOPY Bilateral    left, right 2017   THYROIDECTOMY     partial rt  ballen   TONSILLECTOMY     at age 38.    TOTAL HIP ARTHROPLASTY Right 10/02/2017   Procedure: RIGHT TOTAL HIP ARTHROPLASTY ANTERIOR APPROACH;  Surgeon: Rod Can, MD;  Location: [facility];  Service: Orthopedics;  Laterality: Right;  Needs RNFA   TUBAL LIGATION  1974    There were no vitals filed for this visit.   Subjective Assessment - 06/20/20 1702    Subjective "Earnest Rosier. No that's not it. My maiden name is [place]. Stevenson Clinch."  (pt, re: WAB ? for pt's name) Pt's sister's married name is Morley.    Currently in Pain? No/denies              SLP Evaluation [facility] - 06/20/20 1704      SLP Visit Information   SLP Received On 06/20/20    Referring Provider (SLP) Ivar Bury, MD    Onset Date 05-25-20    Medical Diagnosis Intraparenchymal hemmorhage      Subjective   Patient/Family Stated Goal Improve langauge and cognition  General Information   HPI MAy 2021 MR indicated mod chronic small vessel ischemia, and chronic cerebral microhemmorhages suggesting amyloid angiopathy. On 05-25-20 pt presetned to [facility] with AMS. Imaging found lt occipital IPH, and CT found intraventricular hemmorhage. Pt went to [facility] until 06-08-20 when she was d/c'd home.      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     Cognition   Overall Cognitive Status Impaired/Different from baseline    Area of Impairment Attention;Memory;Following commands;Safety/judgement;Awareness;Problem solving;Orientation    Orientation Level --   oriented to place with yes/no questions   Current Attention Level Focused    Attention Comments SLP ?s pt's functional sustained attention for >20-30 seconds. Pt very  overly verbose and did not appear to recognize errors unless with one-word responses (pt attempted to correct approx 80% of the time). At one time pt had connected her mask to the back of her head without awareness of it being backwards on her head.    Memory Comments decr'd immediate memory due to decr'd attention - pt asked to have assessment stimuli repeated (yes/no questions) x4.     Following Command Comments hindered by attention vs./and auditory comprehension    Safety and Judgement Comments hindered by attention    Awareness Comments hindered by attention    Problem Solving Comments hindered by attention    Behaviors Restless;Impulsive;Perseveration;Confabulation      Auditory Comprehension   Overall Auditory Comprehension --   difficult to assess due to decr'd sustained attention   Yes/No Questions Impaired    Basic Biographical Questions 76-100% accurate   78%   Basic Immediate Environment Questions 25-49% accurate   40% (possibly depressed due to attention)   Overall Auditory Comprehension Comments Diffiuclt to fully assess due to attention defiicts seen today.       Verbal Expression   Overall Verbal Expression Impaired    Naming Impairment    Confrontation 75-100% accurate   88% (7/8) with extra time and mod-max cues   Other Verbal Expression Comments Pt picture description was laden with some correct content words (water, pet, home) however other words were primarily filler words or perseverative from a previous phrase.  Pt simple conversation was nonfunctional.       Standardized Assessments   Standardized Assessments  --   portions of WAB-R                            SLP Short Term Goals - 06/20/20 2256      SLP SHORT TERM GOAL #1   Title pt will attend to 3 separate simple language tasks for 5 mintues each, in 3 sessions    Time 4    Period Weeks    Status New      SLP SHORT TERM GOAL #2   Title pt will answer enrironmental yes/no questions  and/or biographical yes/no questions with 80% success in 3 sessions    Time 4    Period Weeks    Status New      SLP SHORT TERM GOAL #3   Title pt will name 5 family members correctly, when shown a picture, with one error allowed in 3 sessions    Time 4    Period Weeks    Status New  SLP SHORT TERM GOAL #4   Title pt will complete cognitive communication testing if clinicially indicated    Time 4    Period Weeks    Status New            SLP Long Term Goals - 06/20/20 2259      SLP LONG TERM GOAL #1   Title pt will attend to 3 separate simple language tasks for at least 8 minutes in 3 sessions    Time 8    Period Weeks   or 17 total sessions, for all LTGs   Status New      SLP LONG TERM GOAL #2   Title pt will indicate wants and needs with functional langauge (reported by family) so as to limit pt frustration between 5 sessions    Time 8    Period Weeks    Status New            Plan - 06/20/20 2251    Clinical Impression Statement [name] [name] presents today with expressive language deficit and receptive language deficit complicated heavily by significantly impaired attention of 20-30 seconds.Fleeting moments of intellectual awareness were noted as pt twice endorsed anomia (using aphasic language) during aphasia testing. Much of the testing today was challenging due to pt's attention span. SLP suggested to pt's son Legrand Como to perform some simple word matching (f:2) to a common object, and SLP provided some other simple expressive and receptive tasks for him to accomplish at home with [name] [name].    Speech Therapy Frequency 2x / week    Duration 8 weeks   or 17 total sessions   Treatment/Interventions Cueing hierarchy;Language facilitation;Cognitive reorganization;Internal/external aids;Patient/family education;Compensatory strategies;SLP instruction and feedback;Multimodal communcation approach;Functional tasks;Environmental controls    Potential to Achieve Goals Fair     Potential Considerations Severity of impairments;Ability to learn/carryover information;Cooperation/participation level    Consulted and Agree with Plan of Care Patient;Family member/caregiver    Family Member Consulted son           Patient will benefit from skilled therapeutic intervention in order to improve the following deficits and impairments:   Aphasia - Plan: SLP plan of care cert/re-cert  Cognitive communication deficit - Plan: SLP plan of care cert/re-cert    Problem List Patient Active Problem List   Diagnosis Date Noted   AMS (altered mental status) 06/15/2020   UTI (urinary tract infection) 06/15/2020   Essential hypertension 06/15/2020   Dizziness 12/07/2018   Educated about COVID-19 virus infection 12/07/2018   Dermatofibroma 12/23/2017   Osteoarthritis of right hip 10/02/2017   Multinodular goiter 06/15/2015   Post-surgical hypothyroidism 06/15/2015   Leg pain, lateral 08/30/2014   Hyperglycemia 11/05/2013   Family history of Alzheimer's disease 11/05/2013   Memory problem 11/05/2013   Medication side effect - Propofol (burning vein) and prvastatin (myalgia) 04/08/2013   Lesion of spleen 04/08/2013   Arthritis of shoulder 11/03/2012   History of back surgery 11/03/2012   HSV-2 seropositive 02/17/2012   Medicare annual wellness visit, subsequent 10/16/2011   OSA (obstructive sleep apnea) 10/16/2011   History of positive PPD 10/16/2011   History of colonic polyps 07/18/2011   Dyspnea 10/18/2010   Heart burn 10/18/2010   Chest pain 10/18/2010   ADJUSTMENT DISORDER WITH ANXIETY 07/24/2010   CHEST WALL PAIN, ANTERIOR 07/24/2010   KNEE PAIN 04/04/2010   Disturbance in sleep behavior 11/21/2009   LEG PAIN 02/28/2009   TINNITUS 05/11/2008   HEADACHE 02/07/2008   UNSPECIFIED VITAMIN D DEFICIENCY 02/05/2008   OBESITY,  MILD 10/13/2007   ANEMIA 10/13/2007   GASTRITIS, CHRONIC 10/13/2007   DUODENITIS, WITHOUT HEMORRHAGE  10/13/2007   HIATAL HERNIA 10/13/2007   IRRITABLE BOWEL SYNDROME 10/13/2007   GRANULOMA 10/13/2007   RHINITIS 09/23/2007   FASTING HYPERGLYCEMIA 09/04/2007   HYPERLIPIDEMIA 04/07/2007   HYPERTENSION 04/07/2007   GERD 04/07/2007    [name] [name] ,MS, CCC-SLP  06/20/2020, 11:04 PM  [facility] [address] [place], [place], [address] Phone: [phone]   Fax:  [phone]  Name: [name] [name] MRN: [id number] Date of Birth: March 26, 1941

## 2020-06-20 NOTE — Telephone Encounter (Signed)
Pts son is calling in stating that they need a referral for home health assistance,OT, PT and skill nursing and would like to see if you would approve this since the pt has had a stoke and need assistance.

## 2020-06-20 NOTE — Telephone Encounter (Signed)
Okay to place [facility] referral?

## 2020-06-20 NOTE — Therapy (Signed)
[facility] [address] [place], [place], [address] Phone: [phone]   Fax:  [phone]  Physical Therapy Evaluation  Patient Details  Name: [name] [name] MRN: [id number] Date of Birth: July 14, 1941 Referring Provider (PT): Levada Schilling, MD, PCP is Shanon Ace, MD   Encounter Date: 06/20/2020   PT End of Session - 06/20/20 1442    Visit Number 1    Number of Visits 13    Date for PT Re-Evaluation 09/18/20   POC for 6 weeks, Cert for 90 days   Authorization Type BCBS (VL: 30 PT/OT)    PT Start Time 1400    PT Stop Time 1444    PT Time Calculation (min) 44 min    Equipment Utilized During Treatment Gait belt    Activity Tolerance Patient tolerated treatment well    Behavior During Therapy Restless           Past Medical History:  Diagnosis Date  . Adenomatous colon polyp   . Anxiety   . Cataract 2013   bilat removed   . DJD (degenerative joint disease) of lumbar spine   . Estrogen deficiency   . Gastritis   . GERD (gastroesophageal reflux disease)   . Headache(784.0)   . Helicobacter pylori infection 10/13/2007   Qualifier: Diagnosis of  By: Marland Mcalpine    . History of positive PPD 10/16/2011  . HSV-2 infection   . Hyperglycemia   . Hyperlipidemia    no meds now  . Hypertension   . IBS (irritable bowel syndrome)   . Medication side effect - Propofol (burning vein) and prvastatin (myalgia) 04/08/2013   Pravastatin patient went off and rechallenged and symptoms recurred muscle cramps /foot cramps   . Neuromuscular disorder (HCC)    muscle cramps  . Pulsatile tinnitus    had MRA and MPV nl mild carotid dopplers2010  . Shingles 02/09/2011   Right upper face and eyelid but not involving the eye at this point.   . Sleep apnea    does not use c pap  . Thyroid disease   . Vulvitis     Past Surgical History:  Procedure Laterality Date  . ABDOMINAL HYSTERECTOMY  1982   fibroids  . COLONOSCOPY   2003- 2102   adenomatous polyps  . FINGER SURGERY     left middle A1pulley release  . isthmuectomy     thyroid surgery  . LUMBAR SPINE SURGERY  05/1989   spine d/t tumor neurofibroma Quentin Cornwall  . POLYPECTOMY    . SHOULDER ARTHROSCOPY Bilateral    left, right 2017  . THYROIDECTOMY     partial rt  ballen  . TONSILLECTOMY     at age 79.   Marland Kitchen TOTAL HIP ARTHROPLASTY Right 10/02/2017   Procedure: RIGHT TOTAL HIP ARTHROPLASTY ANTERIOR APPROACH;  Surgeon: Rod Can, MD;  Location: [facility];  Service: Orthopedics;  Laterality: Right;  Needs RNFA  . TUBAL LIGATION  1974    There were no vitals filed for this visit.    Subjective Assessment - 06/20/20 1406    Subjective Patient was admitted on 05/25/20 with altered mental status and L occipital IPH. Hospital course was complicated by delirium and agitation requiring use of antipsychotic medications. Patient recieved inpatient rehab and was discharged home with family support. Patient had recent hospital admission on 11/4 for altered mental status. Due to continued confusion, agitation and insomnia since her hospital/rehab discharge.  Found to have delirium worsened due to UTI. Pt  also had EEG which revealed evidence of potential epileptogenicity arising from left more than right frontal region. Son reports that medications are making her super sleepy. Patient is not currently ambulating without AD. Son reports that they are trying to place things on her R side to use it more.    Patient is accompained by: Family member   Son Legrand Como)   Pertinent History CVA, HTN, R THA, Anxiety, Depression, Hyopthyroidism, DJD of Lumbar Spina, GERD    Limitations Standing;Walking;House hold activities;Sitting    Patient Stated Goals Wants to get back to get right mind; go back to normal life again    Currently in Pain? No/denies              [facility] PT Assessment - 06/20/20 1404      Assessment   Medical Diagnosis Hemorrhagic Stroke    Referring  Provider (PT) Levada Schilling, MD    Onset Date/Surgical Date 05/25/20    Hand Dominance Right    Prior Therapy Inpatient Rehab, Home Health PT      Precautions   Precautions Fall      Balance Screen   Has the patient fallen in the past 6 months Yes    How many times? 2    Has the patient had a decrease in activity level because of a fear of falling?  No    Is the patient reluctant to leave their home because of a fear of falling?  No      Home Social worker Private residence    Living Arrangements Children    Available Help at Discharge Family    Type of [place] to enter    Entrance Stairs-Number of Steps 3-4    Entrance Stairs-Rails Right;Left    Home Layout One level    [place] bars - tub/shower;Toilet riser    Additional Comments Sisters visit often. Sons are alternating days/nights to have 24/7 supervision. Son reports looking into [facility] services to allow have someone be with patient at times.       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       Cognition   Overall Cognitive Status Impaired/Different from baseline    Area of Impairment Orientation    Orientation Level Disoriented to;Place;Time    General Comments patient oriented to person. Reports that she is in [place].     Memory --      Observation/Other Assessments   Focus on Therapeutic Outcomes (FOTO)  50/100      Sensation   Light Touch Appears Intact    Additional Comments difficult to assess due to cognition      Coordination   Gross Motor Movements are Fluid and Coordinated No    Heel Shin Test decreased coordination noted in LE      Tone   Assessment Location Right Lower Extremity      ROM / Strength   AROM / PROM / Strength AROM;Strength      AROM   Overall AROM  Within functional limits for tasks performed    Overall AROM Comments WFL for tasks  during session      Strength   Overall Strength Deficits    Strength Assessment Site Hip;Knee;Ankle    Right/Left Hip Right;Left    Right Hip Flexion 3+/5    Right Hip ABduction 4-/5  Right Hip ADduction 4-/5    Left Hip Flexion 4/5    Left Hip ABduction 4/5    Left Hip ADduction 4/5    Right/Left Knee Right;Left    Right Knee Flexion 4-/5    Right Knee Extension 3+/5    Left Knee Flexion 4/5    Left Knee Extension 4/5    Right/Left Ankle Right;Left    Right Ankle Dorsiflexion 3+/5    Left Ankle Dorsiflexion 3+/5      Bed Mobility   Bed Mobility Rolling Right;Rolling Left;Supine to Sit;Sit to Supine    Rolling Right Independent    Rolling Left Independent    Supine to Sit Independent    Sit to Supine Independent      Transfers   Transfers Sit to Stand;Stand to Sit    Sit to Stand 5: Supervision    Sit to Stand Details Verbal cues for technique    Five time sit to stand comments  17.35 secs w/o UE support    Stand to Sit 5: Supervision    Stand to Sit Details (indicate cue type and reason) Verbal cues for technique      Ambulation/Gait   Ambulation/Gait Yes    Ambulation/Gait Assistance 5: Supervision;4: Min guard    Ambulation/Gait Assistance Details patient ambulating without AD, superivison and intermittent CGA for negotiation in busy therapy gym. simple commands required throughout ambulation for negotiation    Ambulation Distance (Feet) 115 Feet    Assistive device None    Gait Pattern Step-through pattern;Decreased arm swing - right;Decreased arm swing - left    Ambulation Surface Level;Indoor    Gait velocity 15.56 secs = 2.11 ft/sec    Stairs Yes    Stairs Assistance 5: Supervision    Stairs Assistance Details (indicate cue type and reason) ascend/descend stairs with rail on L side, alteranting pattern initially with patient switching half way up steps and going to step to pattern.     Stair Management Technique One rail Left;Alternating pattern;Forwards     Number of Stairs 4    Height of Stairs 6      Standardized Balance Assessment   Standardized Balance Assessment Timed Up and Go Test      Timed Up and Go Test   TUG Normal TUG    Normal TUG (seconds) 14.96    TUG Comments w/o AD. require multiple attempts due to difficulty following instructions      RLE Tone   RLE Tone Mild;Hypertonic                      Objective measurements completed on examination: See above findings.               PT Education - 06/20/20 1632    Education Details Educated on [employer]) Educated Patient;Child(ren)    Methods Explanation    Comprehension Verbalized understanding            PT Short Term Goals - 06/20/20 1653      PT SHORT TERM GOAL #1   Title Patient will be independent with caregiver assistance for initial HEP for strengthening/balance (ALL STGS DUE: 07/11/20)    Baseline no HEP established    Time 3    Period Weeks    Status New    Target Date 07/11/20      PT SHORT TERM GOAL #2   Title Patient will undergo assessment of Berg Balance and LTG to be set as  appropriate    Baseline TBA    Time 3    Period Weeks    Status New      PT SHORT TERM GOAL #3   Title Patient will improve 5x sit<> stand to </= 15 seconds to demonstrate improved balance    Baseline 17.35 secs    Time 3    Period Weeks    Status New             PT Long Term Goals - 06/20/20 1655      PT LONG TERM GOAL #1   Title Patient will be independent with final HEP with caregiver assistance for balance/strengthening (ALL LTGS Due: 08/01/20)    Baseline no HEP established    Time 6    Period Weeks    Status New    Target Date 08/01/20      PT LONG TERM GOAL #2   Title Patient will demosntate ability to ambulate >/= 2.7 ft/sec to demonstrate improved community ambulation    Baseline 2.11 ft/sec    Time 6    Period Weeks    Status New      PT LONG TERM GOAL #3   Title LTG to be set for [employer]  as appropriate    Baseline TBA    Time 6    Period Weeks    Status New      PT LONG TERM GOAL #4   Title Patient will complete TUG in </= 12 seconds to demosntrate reduced fall risk    Baseline 14.96 secs    Time 6    Period Weeks    Status New      PT LONG TERM GOAL #5   Title Patient will be able to complete 5x sit <> stand in </= 12 seoncds to demonstrate improved mobility and reduced fall risk    Baseline 17.35 secs    Time 6    Period Weeks    Status New                  Plan - 06/20/20 1645    Clinical Impression Statement Patient is a 79 y.o. female that was referred to Neuro OPPT services for L Occipital IPH. Patient had hospital course complicated by delirium/agitation, patient demonstrating with altered mental status. Patient only oriented to person today, not oriented to place/time. Upon evaluation patient presents with the following impairments: impaired gait, decreased balance, decreased coordination, impaired cognition, visual impairments, decreased strength, and increased risk for falls. With 5x sit <> stand test time of 17.35 secs and TUG time of 14.96 secs patient is at a high risk for falls. Patient is currnetly ambulating at 2.11 ft/sec without AD, demonstrating limited community ambulation. Patient require simple commands throughout session, due to decreased ability to follow multi step commands. Patient will benefit from skilled PT services to maximize functional mobility and reduce fall risk.    Personal Factors and Comorbidities Comorbidity 3+;Behavior Pattern    Comorbidities PMH: CVA, HTN, R THA, Anxiety, Depression, Hyopthyroidism, DJD of Lumbar Spina, GERD    Examination-Activity Limitations Bathing;Dressing;Stairs;Stand;Transfers;Locomotion Level    Examination-Participation Restrictions Medication Management;Community Activity;Cleaning    Stability/Clinical Decision Making Evolving/Moderate complexity    Clinical Decision Making Moderate    Rehab  Potential Fair    PT Frequency 2x / week    PT Duration 6 weeks    PT Treatment/Interventions ADLs/Self Care Home Management;Gait training;Stair training;Functional mobility training;Therapeutic activities;DME Instruction;Therapeutic exercise;Balance training;Neuromuscular re-education;Patient/family education;Orthotic Fit/Training;Manual techniques;Passive range of  motion;Vestibular;Cognitive remediation    PT Next Visit Plan Assess Berg Balance; Initiate HEP with caregiver assistance    Recommended Other Services Occupational Therapy/Speech Therapy    Consulted and Agree with Plan of Care Patient;Family member/caregiver    Family Member Consulted Son Legrand Como)           Patient will benefit from skilled therapeutic intervention in order to improve the following deficits and impairments:  Abnormal gait, Decreased balance, Decreased endurance, Difficulty walking, Impaired tone, Pain, Decreased strength, Decreased knowledge of use of DME, Decreased coordination, Decreased activity tolerance, Decreased range of motion, Impaired vision/preception  Visit Diagnosis: Other abnormalities of gait and mobility  Difficulty in walking, not elsewhere classified  Unsteadiness on feet  Muscle weakness (generalized)     Problem List Patient Active Problem List   Diagnosis Date Noted  . AMS (altered mental status) 06/15/2020  . UTI (urinary tract infection) 06/15/2020  . Essential hypertension 06/15/2020  . Dizziness 12/07/2018  . Educated about COVID-19 virus infection 12/07/2018  . Dermatofibroma 12/23/2017  . Osteoarthritis of right hip 10/02/2017  . Multinodular goiter 06/15/2015  . Post-surgical hypothyroidism 06/15/2015  . Leg pain, lateral 08/30/2014  . Hyperglycemia 11/05/2013  . Family history of Alzheimer's disease 11/05/2013  . Memory problem 11/05/2013  . Medication side effect - Propofol (burning vein) and prvastatin (myalgia) 04/08/2013  . Lesion of spleen 04/08/2013  .  Arthritis of shoulder 11/03/2012  . History of back surgery 11/03/2012  . HSV-2 seropositive 02/17/2012  . Medicare annual wellness visit, subsequent 10/16/2011  . OSA (obstructive sleep apnea) 10/16/2011  . History of positive PPD 10/16/2011  . History of colonic polyps 07/18/2011  . Dyspnea 10/18/2010  . Heart burn 10/18/2010  . Chest pain 10/18/2010  . ADJUSTMENT DISORDER WITH ANXIETY 07/24/2010  . CHEST WALL PAIN, ANTERIOR 07/24/2010  . KNEE PAIN 04/04/2010  . Disturbance in sleep behavior 11/21/2009  . LEG PAIN 02/28/2009  . TINNITUS 05/11/2008  . HEADACHE 02/07/2008  . UNSPECIFIED VITAMIN D DEFICIENCY 02/05/2008  . OBESITY, MILD 10/13/2007  . ANEMIA 10/13/2007  . GASTRITIS, CHRONIC 10/13/2007  . DUODENITIS, WITHOUT HEMORRHAGE 10/13/2007  . HIATAL HERNIA 10/13/2007  . IRRITABLE BOWEL SYNDROME 10/13/2007  . GRANULOMA 10/13/2007  . RHINITIS 09/23/2007  . FASTING HYPERGLYCEMIA 09/04/2007  . HYPERLIPIDEMIA 04/07/2007  . HYPERTENSION 04/07/2007  . GERD 04/07/2007    Jones Bales, PT, DPT 06/20/2020, 4:58 PM  [facility] [address] [place], [place], [address] Phone: [phone]   Fax:  [phone]  Name: [name] [name] MRN: [id number] Date of Birth: 05-30-1941

## 2020-06-21 ENCOUNTER — Ambulatory Visit: Payer: Medicare Other | Admitting: Occupational Therapy

## 2020-06-22 ENCOUNTER — Telehealth: Payer: Self-pay

## 2020-06-22 ENCOUNTER — Telehealth: Payer: Self-pay | Admitting: Internal Medicine

## 2020-06-22 LAB — METHYLMALONIC ACID, SERUM: Methylmalonic Acid, Quantitative: 95 nmol/L (ref 0–378)

## 2020-06-22 NOTE — Telephone Encounter (Signed)
Received fax from [facility] for delay in care for PT [facility] on 06/22/2020 due to availability.   Med[facility] phone number is [phone], [phone]

## 2020-06-22 NOTE — Telephone Encounter (Signed)
error 

## 2020-06-23 ENCOUNTER — Telehealth (INDEPENDENT_AMBULATORY_CARE_PROVIDER_SITE_OTHER): Payer: Medicare Other | Admitting: Internal Medicine

## 2020-06-23 ENCOUNTER — Encounter: Payer: Self-pay | Admitting: Internal Medicine

## 2020-06-23 ENCOUNTER — Other Ambulatory Visit: Payer: Self-pay

## 2020-06-23 DIAGNOSIS — G479 Sleep disorder, unspecified: Secondary | ICD-10-CM

## 2020-06-23 DIAGNOSIS — R4182 Altered mental status, unspecified: Secondary | ICD-10-CM

## 2020-06-23 DIAGNOSIS — I619 Nontraumatic intracerebral hemorrhage, unspecified: Secondary | ICD-10-CM

## 2020-06-23 DIAGNOSIS — E890 Postprocedural hypothyroidism: Secondary | ICD-10-CM

## 2020-06-23 DIAGNOSIS — R3989 Other symptoms and signs involving the genitourinary system: Secondary | ICD-10-CM | POA: Diagnosis not present

## 2020-06-23 DIAGNOSIS — R4586 Emotional lability: Secondary | ICD-10-CM

## 2020-06-23 DIAGNOSIS — Z09 Encounter for follow-up examination after completed treatment for conditions other than malignant neoplasm: Secondary | ICD-10-CM

## 2020-06-23 DIAGNOSIS — Z742 Need for assistance at home and no other household member able to render care: Secondary | ICD-10-CM

## 2020-06-23 DIAGNOSIS — Z79899 Other long term (current) drug therapy: Secondary | ICD-10-CM

## 2020-06-23 DIAGNOSIS — R443 Hallucinations, unspecified: Secondary | ICD-10-CM | POA: Diagnosis not present

## 2020-06-23 LAB — VITAMIN B1: Vitamin B1 (Thiamine): 132 nmol/L (ref 66.5–200.0)

## 2020-06-23 MED ORDER — LEVOTHYROXINE SODIUM 100 MCG PO TABS
ORAL_TABLET | ORAL | 1 refills | Status: DC
Start: 2020-06-23 — End: 2020-07-03

## 2020-06-23 NOTE — Progress Notes (Signed)
Virtual Visit via Video Note  I connected with@ on 06/23/20 at 11:00 AM EST by a video enabled telemedicine application and verified that I am speaking with the correct person using two identifiers. Location patient: home Location provider:work  office Persons participating in the virtual visit: patient, son / [place] provider  [place] national recommendations  regarding COVID 19 pandemic   video visit is advised over in office visit for this patient.  Patient aware  of the limitations of evaluation and management by telemedicine and  availability of in person appointments. and agreed to proceed.   HPI: [name] [name] and son presents for video visit   Has not bathed in a couple days and not easily approachable she has been up all night going from things to things she usually has an organized home but things are in disarray. She is irritable and fine one moment and explosive the next. She is seeing things and talking to little children who are not there. Son asked about options for skilled nursing into referral back to rehab. There is a coming up neurology appointment in 2 weeks. She is now off the diuretic to avoid dehydration blood pressure seems to be controlled.  Her hospital course on hospital discharge showed acute progressive delirium metabolic encephalopathy and new onset seizure as she had an abnormal EEG but no active seizure activity.  Was begun on medication Depakote 500 twice daily per neurology recommendations.  At the time of discharge her mental status was "much better" CT scan of the head did not show increasing edema and hemorrhage was improved no midline shift. Was discharged home with home health PT but actually was prescheduled so was done as outpatient but she was unable to go because of her current status. ROS: See pertinent positives and negatives per HPI.  Past Medical History:  Diagnosis Date  . Adenomatous colon polyp   . Anxiety   . Cataract 2013   bilat  removed   . DJD (degenerative joint disease) of lumbar spine   . Estrogen deficiency   . Gastritis   . GERD (gastroesophageal reflux disease)   . Headache(784.0)   . Helicobacter pylori infection 10/13/2007   Qualifier: Diagnosis of  By: Marland Mcalpine    . History of positive PPD 10/16/2011  . HSV-2 infection   . Hyperglycemia   . Hyperlipidemia    no meds now  . Hypertension   . IBS (irritable bowel syndrome)   . Medication side effect - Propofol (burning vein) and prvastatin (myalgia) 04/08/2013   Pravastatin patient went off and rechallenged and symptoms recurred muscle cramps /foot cramps   . Neuromuscular disorder (HCC)    muscle cramps  . Pulsatile tinnitus    had MRA and MPV nl mild carotid dopplers2010  . Shingles 02/09/2011   Right upper face and eyelid but not involving the eye at this point.   . Sleep apnea    does not use c pap  . Thyroid disease   . Vulvitis     Past Surgical History:  Procedure Laterality Date  . ABDOMINAL HYSTERECTOMY  1982   fibroids  . COLONOSCOPY  2003- 2102   adenomatous polyps  . FINGER SURGERY     left middle A1pulley release  . isthmuectomy     thyroid surgery  . LUMBAR SPINE SURGERY  05/1989   spine d/t tumor neurofibroma Quentin Cornwall  . POLYPECTOMY    . SHOULDER ARTHROSCOPY Bilateral    left, right 2017  . THYROIDECTOMY  partial rt  ballen  . TONSILLECTOMY     at age 44.   Marland Kitchen TOTAL HIP ARTHROPLASTY Right 10/02/2017   Procedure: RIGHT TOTAL HIP ARTHROPLASTY ANTERIOR APPROACH;  Surgeon: Rod Can, MD;  Location: [facility];  Service: Orthopedics;  Laterality: Right;  Needs RNFA  . TUBAL LIGATION  1974    Family History  Problem Relation Age of Onset  . Alzheimer's disease Mother   . Heart attack Father        95  . Asthma Sister   . Colon cancer Sister   . Heart attack Brother        53  . Colon polyps Sister   . Colon cancer Sister   . Colon cancer Paternal Aunt   . Thyroid disease Sister        multinodular  goiter  . Stomach cancer Neg Hx   . Breast cancer Neg Hx   . Rectal cancer Neg Hx     Social History   Tobacco Use  . Smoking status: Former Smoker    Packs/day: 0.50    Years: 23.00    Pack years: 11.50    Types: Cigarettes    Quit date: 06/16/1984    Years since quitting: 36.0  . Smokeless tobacco: Never Used  Vaping Use  . Vaping Use: Never used  Substance Use Topics  . Alcohol use: No  . Drug use: No      Current Outpatient Medications:  .  acetaminophen (TYLENOL) 500 MG tablet, Take 1,000 mg by mouth every 6 (six) hours as needed for mild pain, moderate pain or headache. , Disp: , Rfl:  .  amLODipine (NORVASC) 2.5 MG tablet, Take 1 tablet (2.5 mg total) by mouth daily., Disp: 90 tablet, Rfl: 0 .  divalproex (DEPAKOTE) 500 MG DR tablet, Take 1 tablet (500 mg total) by mouth every 12 (twelve) hours., Disp: 180 tablet, Rfl: 0 .  famotidine (PEPCID) 20 MG tablet, Take 1 tablet (20 mg total) by mouth 2 (two) times daily., Disp: 180 tablet, Rfl: 1 .  fluticasone (FLONASE) 50 MCG/ACT nasal spray, Place 2 sprays into both nostrils as needed for allergies. FOR RHINITIS (Patient taking differently: Place 2 sprays into both nostrils daily as needed for allergies. FOR RHINITIS), Disp: 16 mL, Rfl: 5 .  levothyroxine (SYNTHROID) 100 MCG tablet, Take  5 days per week ( dosage change), Disp: 90 tablet, Rfl: 1 .  melatonin 3 MG TABS tablet, Take 6 mg by mouth at bedtime. , Disp: , Rfl:  .  QUEtiapine (SEROQUEL) 25 MG tablet, Take 12.5 mg by mouth at bedtime. May take an additional 1/2 tablet 2 times daily as needed for agitation, Disp: , Rfl:  .  sennosides-docusate sodium (SENOKOT-S) 8.6-50 MG tablet, Take 1 tablet by mouth 2 (two) times daily as needed for constipation., Disp: , Rfl:  .  thiamine 100 MG tablet, Take 100 mg by mouth daily., Disp: , Rfl:  .  vitamin B-12 1000 MCG tablet, Take 1 tablet (1,000 mcg total) by mouth daily., Disp: 90 tablet, Rfl: 0  EXAM: BP Readings from Last 3  Encounters:  06/19/20 138/78  06/17/20 (!) 146/72  06/14/20 140/82    VITALS per patient if applicable: Record review discussion and consultation.   Lab Results  Component Value Date   WBC 5.9 06/16/2020   HGB 11.0 (L) 06/16/2020   HCT 35.0 (L) 06/16/2020   PLT 245 06/16/2020   GLUCOSE 104 (H) 06/16/2020   CHOL 218 (H) 11/15/2019  TRIG 125.0 11/15/2019   HDL 55.90 11/15/2019   LDLDIRECT 131.1 08/09/2009   LDLCALC 137 (H) 11/15/2019   ALT 19 06/15/2020   AST 28 06/15/2020   NA 139 06/16/2020   K 3.7 06/16/2020   CL 105 06/16/2020   CREATININE 1.34 (H) 06/16/2020   BUN 30 (H) 06/16/2020   CO2 24 06/16/2020   TSH 0.042 (L) 06/17/2020   INR 1.1 06/15/2020   HGBA1C 5.9 11/15/2019    ASSESSMENT AND PLAN:  Discussed the following assessment and plan:    ICD-10-CM   1. Altered mental status, unspecified altered mental status type  R41.82 Urinalysis, Routine w reflex microscopic    Urine Culture  2. Hallucinations  R44.3   3. Suspected UTI  R39.89 Urinalysis, Routine w reflex microscopic    Urine Culture  4. Hemorrhagic stroke ([place])  I61.9   5. Medication management  Z79.899   6. Sleep disturbance  G47.9   7. Mood swings  R45.86   8. Hospital discharge follow-up  Z09   9. Post-surgical hypothyroidism  E89.0    Truly difficult situation at this time she is having hallucinations and lack of sleep over the last 2 days.  Just finished UTI treatment yesterday.  Apparently hospital team felt that her mental status was adequate nonagitated and okay to go home and felt to be from hospitalization and acute problem.  However this does not seem to be the case at home; safety is an issue at this time Family trying to consider other options. She was diagnosed with possible seizures while in hospital put on Depakote I doubt that that is causing her problem. Her TSH was slightly low and we will decrease to 5 times a week instead of 7 days a week we will hold over weekend Discussed  increasing the Seroquel to 25 twice daily as possible maximum dose would be 75 twice daily but go slow at first because of sedation. She has an appointment with Dr. Tomi Likens on 24 November will give him a heads up.  Need neurology team help as far as medications and her encephalopathic symptoms go. I put an order in for follow-up urinalysis and culture at [facility] lab for next week but I doubt a recurrent UTI is causing the symptoms  Family does visit disposition for care is becoming difficult as she is felt to be currently unsafe and hard for family to give 15 care.   Options  Back to ED  and or  Behavioral health  Psych  Counseled.   Expectant management and discussion of plan and treatment with opportunity to ask questions and all were answered. The patient agreed with the plan and demonstrated an understanding of the instructions.   Advised to call back or seek an in-person evaluation if worsening  or having  further concerns . Return for update next week my chart .45 minutes of   eval revewi and consultations    Shanon Ace, MD

## 2020-06-25 ENCOUNTER — Inpatient Hospital Stay (HOSPITAL_COMMUNITY)
Admission: EM | Admit: 2020-06-25 | Discharge: 2020-07-03 | DRG: 064 | Disposition: A | Discharge status: Hospice | Payer: Medicare Other | Attending: Internal Medicine | Admitting: Internal Medicine

## 2020-06-25 ENCOUNTER — Emergency Department (HOSPITAL_COMMUNITY): Payer: Medicare Other

## 2020-06-25 DIAGNOSIS — Z8371 Family history of colonic polyps: Secondary | ICD-10-CM | POA: Diagnosis not present

## 2020-06-25 DIAGNOSIS — Z825 Family history of asthma and other chronic lower respiratory diseases: Secondary | ICD-10-CM

## 2020-06-25 DIAGNOSIS — S06340A Traumatic hemorrhage of right cerebrum without loss of consciousness, initial encounter: Secondary | ICD-10-CM | POA: Diagnosis not present

## 2020-06-25 DIAGNOSIS — Z885 Allergy status to narcotic agent status: Secondary | ICD-10-CM

## 2020-06-25 DIAGNOSIS — G40909 Epilepsy, unspecified, not intractable, without status epilepticus: Secondary | ICD-10-CM

## 2020-06-25 DIAGNOSIS — G9341 Metabolic encephalopathy: Secondary | ICD-10-CM | POA: Diagnosis not present

## 2020-06-25 DIAGNOSIS — I69318 Other symptoms and signs involving cognitive functions following cerebral infarction: Secondary | ICD-10-CM | POA: Diagnosis not present

## 2020-06-25 DIAGNOSIS — E870 Hyperosmolality and hypernatremia: Secondary | ICD-10-CM | POA: Diagnosis not present

## 2020-06-25 DIAGNOSIS — Z7189 Other specified counseling: Secondary | ICD-10-CM

## 2020-06-25 DIAGNOSIS — S06310A Contusion and laceration of right cerebrum without loss of consciousness, initial encounter: Secondary | ICD-10-CM

## 2020-06-25 DIAGNOSIS — I69119 Unspecified symptoms and signs involving cognitive functions following nontraumatic intracerebral hemorrhage: Secondary | ICD-10-CM

## 2020-06-25 DIAGNOSIS — Z79899 Other long term (current) drug therapy: Secondary | ICD-10-CM

## 2020-06-25 DIAGNOSIS — Z96641 Presence of right artificial hip joint: Secondary | ICD-10-CM | POA: Diagnosis present

## 2020-06-25 DIAGNOSIS — I680 Cerebral amyloid angiopathy: Secondary | ICD-10-CM | POA: Diagnosis not present

## 2020-06-25 DIAGNOSIS — M47816 Spondylosis without myelopathy or radiculopathy, lumbar region: Secondary | ICD-10-CM | POA: Diagnosis present

## 2020-06-25 DIAGNOSIS — Z888 Allergy status to other drugs, medicaments and biological substances status: Secondary | ICD-10-CM

## 2020-06-25 DIAGNOSIS — G709 Myoneural disorder, unspecified: Secondary | ICD-10-CM | POA: Diagnosis present

## 2020-06-25 DIAGNOSIS — R1319 Other dysphagia: Secondary | ICD-10-CM | POA: Diagnosis not present

## 2020-06-25 DIAGNOSIS — R4182 Altered mental status, unspecified: Secondary | ICD-10-CM | POA: Diagnosis not present

## 2020-06-25 DIAGNOSIS — R627 Adult failure to thrive: Secondary | ICD-10-CM | POA: Diagnosis not present

## 2020-06-25 DIAGNOSIS — E876 Hypokalemia: Secondary | ICD-10-CM | POA: Diagnosis present

## 2020-06-25 DIAGNOSIS — Z66 Do not resuscitate: Secondary | ICD-10-CM | POA: Diagnosis not present

## 2020-06-25 DIAGNOSIS — G4733 Obstructive sleep apnea (adult) (pediatric): Secondary | ICD-10-CM | POA: Diagnosis present

## 2020-06-25 DIAGNOSIS — Z87891 Personal history of nicotine dependence: Secondary | ICD-10-CM

## 2020-06-25 DIAGNOSIS — I615 Nontraumatic intracerebral hemorrhage, intraventricular: Principal | ICD-10-CM | POA: Diagnosis present

## 2020-06-25 DIAGNOSIS — I616 Nontraumatic intracerebral hemorrhage, multiple localized: Secondary | ICD-10-CM | POA: Diagnosis not present

## 2020-06-25 DIAGNOSIS — E890 Postprocedural hypothyroidism: Secondary | ICD-10-CM | POA: Diagnosis present

## 2020-06-25 DIAGNOSIS — Z8349 Family history of other endocrine, nutritional and metabolic diseases: Secondary | ICD-10-CM

## 2020-06-25 DIAGNOSIS — R410 Disorientation, unspecified: Secondary | ICD-10-CM | POA: Diagnosis not present

## 2020-06-25 DIAGNOSIS — Z7401 Bed confinement status: Secondary | ICD-10-CM | POA: Diagnosis not present

## 2020-06-25 DIAGNOSIS — Z20822 Contact with and (suspected) exposure to covid-19: Secondary | ICD-10-CM | POA: Diagnosis present

## 2020-06-25 DIAGNOSIS — E854 Organ-limited amyloidosis: Secondary | ICD-10-CM | POA: Diagnosis present

## 2020-06-25 DIAGNOSIS — E785 Hyperlipidemia, unspecified: Secondary | ICD-10-CM | POA: Diagnosis not present

## 2020-06-25 DIAGNOSIS — I619 Nontraumatic intracerebral hemorrhage, unspecified: Secondary | ICD-10-CM | POA: Diagnosis present

## 2020-06-25 DIAGNOSIS — Z515 Encounter for palliative care: Secondary | ICD-10-CM | POA: Diagnosis not present

## 2020-06-25 DIAGNOSIS — I10 Essential (primary) hypertension: Secondary | ICD-10-CM | POA: Diagnosis not present

## 2020-06-25 DIAGNOSIS — K589 Irritable bowel syndrome without diarrhea: Secondary | ICD-10-CM | POA: Diagnosis not present

## 2020-06-25 DIAGNOSIS — E669 Obesity, unspecified: Secondary | ICD-10-CM | POA: Diagnosis present

## 2020-06-25 DIAGNOSIS — I6389 Other cerebral infarction: Secondary | ICD-10-CM | POA: Diagnosis not present

## 2020-06-25 DIAGNOSIS — Z820 Family history of epilepsy and other diseases of the nervous system: Secondary | ICD-10-CM

## 2020-06-25 DIAGNOSIS — E039 Hypothyroidism, unspecified: Secondary | ICD-10-CM

## 2020-06-25 DIAGNOSIS — Z6830 Body mass index (BMI) 30.0-30.9, adult: Secondary | ICD-10-CM

## 2020-06-25 DIAGNOSIS — R52 Pain, unspecified: Secondary | ICD-10-CM | POA: Diagnosis not present

## 2020-06-25 DIAGNOSIS — G936 Cerebral edema: Secondary | ICD-10-CM | POA: Diagnosis not present

## 2020-06-25 DIAGNOSIS — G4730 Sleep apnea, unspecified: Secondary | ICD-10-CM | POA: Diagnosis present

## 2020-06-25 DIAGNOSIS — M2550 Pain in unspecified joint: Secondary | ICD-10-CM | POA: Diagnosis not present

## 2020-06-25 DIAGNOSIS — Z79890 Hormone replacement therapy (postmenopausal): Secondary | ICD-10-CM

## 2020-06-25 DIAGNOSIS — F0391 Unspecified dementia with behavioral disturbance: Secondary | ICD-10-CM | POA: Diagnosis present

## 2020-06-25 DIAGNOSIS — Z741 Need for assistance with personal care: Secondary | ICD-10-CM | POA: Diagnosis not present

## 2020-06-25 DIAGNOSIS — R569 Unspecified convulsions: Secondary | ICD-10-CM | POA: Diagnosis not present

## 2020-06-25 DIAGNOSIS — Z800 Family history of malignant neoplasm of digestive organs: Secondary | ICD-10-CM | POA: Diagnosis not present

## 2020-06-25 DIAGNOSIS — S90411A Abrasion, right great toe, initial encounter: Secondary | ICD-10-CM | POA: Diagnosis present

## 2020-06-25 DIAGNOSIS — K219 Gastro-esophageal reflux disease without esophagitis: Secondary | ICD-10-CM | POA: Diagnosis present

## 2020-06-25 DIAGNOSIS — S06360A Traumatic hemorrhage of cerebrum, unspecified, without loss of consciousness, initial encounter: Secondary | ICD-10-CM | POA: Diagnosis not present

## 2020-06-25 DIAGNOSIS — R0902 Hypoxemia: Secondary | ICD-10-CM | POA: Diagnosis not present

## 2020-06-25 DIAGNOSIS — Z8249 Family history of ischemic heart disease and other diseases of the circulatory system: Secondary | ICD-10-CM | POA: Diagnosis not present

## 2020-06-25 DIAGNOSIS — F32A Depression, unspecified: Secondary | ICD-10-CM | POA: Diagnosis not present

## 2020-06-25 DIAGNOSIS — Z91018 Allergy to other foods: Secondary | ICD-10-CM

## 2020-06-25 DIAGNOSIS — I959 Hypotension, unspecified: Secondary | ICD-10-CM | POA: Diagnosis not present

## 2020-06-25 DIAGNOSIS — I69320 Aphasia following cerebral infarction: Secondary | ICD-10-CM | POA: Diagnosis not present

## 2020-06-25 DIAGNOSIS — Z9183 Wandering in diseases classified elsewhere: Secondary | ICD-10-CM

## 2020-06-25 DIAGNOSIS — R404 Transient alteration of awareness: Secondary | ICD-10-CM | POA: Diagnosis not present

## 2020-06-25 DIAGNOSIS — I69120 Aphasia following nontraumatic intracerebral hemorrhage: Secondary | ICD-10-CM

## 2020-06-25 DIAGNOSIS — G319 Degenerative disease of nervous system, unspecified: Secondary | ICD-10-CM | POA: Diagnosis not present

## 2020-06-25 LAB — CBC WITH DIFFERENTIAL/PLATELET
Abs Immature Granulocytes: 0.02 10*3/uL (ref 0.00–0.07)
Basophils Absolute: 0 10*3/uL (ref 0.0–0.1)
Basophils Relative: 0 %
Eosinophils Absolute: 0 10*3/uL (ref 0.0–0.5)
Eosinophils Relative: 1 %
HCT: 36.1 % (ref 36.0–46.0)
Hemoglobin: 11 g/dL — ABNORMAL LOW (ref 12.0–15.0)
Immature Granulocytes: 0 %
Lymphocytes Relative: 14 %
Lymphs Abs: 1.1 10*3/uL (ref 0.7–4.0)
MCH: 28.9 pg (ref 26.0–34.0)
MCHC: 30.5 g/dL (ref 30.0–36.0)
MCV: 95 fL (ref 80.0–100.0)
Monocytes Absolute: 0.7 10*3/uL (ref 0.1–1.0)
Monocytes Relative: 10 %
Neutro Abs: 5.7 10*3/uL (ref 1.7–7.7)
Neutrophils Relative %: 75 %
Platelets: 148 10*3/uL — ABNORMAL LOW (ref 150–400)
RBC: 3.8 MIL/uL — ABNORMAL LOW (ref 3.87–5.11)
RDW: 12.5 % (ref 11.5–15.5)
WBC: 7.6 10*3/uL (ref 4.0–10.5)
nRBC: 0 % (ref 0.0–0.2)

## 2020-06-25 LAB — BASIC METABOLIC PANEL
Anion gap: 13 (ref 5–15)
BUN: 20 mg/dL (ref 8–23)
CO2: 24 mmol/L (ref 22–32)
Calcium: 9.3 mg/dL (ref 8.9–10.3)
Chloride: 104 mmol/L (ref 98–111)
Creatinine, Ser: 0.93 mg/dL (ref 0.44–1.00)
GFR, Estimated: >60 mL/min (ref >60)
Glucose, Bld: 115 mg/dL — ABNORMAL HIGH (ref 70–99)
Potassium: 3.2 mmol/L — ABNORMAL LOW (ref 3.5–5.1)
Sodium: 141 mmol/L (ref 135–145)

## 2020-06-25 LAB — RESPIRATORY PANEL BY RT PCR (FLU A&B, COVID)
Influenza A by PCR: NEGATIVE
Influenza B by PCR: NEGATIVE
SARS Coronavirus 2 by RT PCR: NEGATIVE

## 2020-06-25 LAB — VALPROIC ACID LEVEL: Valproic Acid Lvl: 66 ug/mL (ref 50.0–100.0)

## 2020-06-25 MED ORDER — DIVALPROEX SODIUM 250 MG PO DR TAB
500.0000 mg | DELAYED_RELEASE_TABLET | Freq: Two times a day (BID) | ORAL | Status: DC
  Administered 2020-06-25 – 2020-06-27 (×3): 500 mg via ORAL
  Filled 2020-06-25 (×4): qty 2

## 2020-06-25 MED ORDER — AMLODIPINE BESYLATE 2.5 MG PO TABS
2.5000 mg | ORAL_TABLET | Freq: Every day | ORAL | Status: DC
  Administered 2020-06-26 – 2020-06-27 (×2): 2.5 mg via ORAL
  Filled 2020-06-25 (×2): qty 1

## 2020-06-25 MED ORDER — LORAZEPAM 2 MG/ML IJ SOLN: 0.5000 mg | Freq: Once | INTRAMUSCULAR | Status: DC

## 2020-06-25 MED ORDER — MELATONIN 3 MG PO TABS
6.0000 mg | ORAL_TABLET | Freq: Every day | ORAL | Status: DC
  Administered 2020-06-25 – 2020-06-29 (×4): 6 mg via ORAL
  Filled 2020-06-25 (×7): qty 2

## 2020-06-25 MED ORDER — LORAZEPAM 2 MG/ML IJ SOLN
1.0000 mg | Freq: Once | INTRAMUSCULAR | Status: AC
  Administered 2020-06-25: 1 mg via INTRAMUSCULAR
  Filled 2020-06-25: qty 1

## 2020-06-25 MED ORDER — ACETAMINOPHEN 500 MG PO TABS
1000.0000 mg | ORAL_TABLET | Freq: Four times a day (QID) | ORAL | Status: DC | PRN
  Administered 2020-06-26 – 2020-07-02 (×4): 1000 mg via ORAL
  Filled 2020-06-25 (×5): qty 2

## 2020-06-25 MED ORDER — POTASSIUM CHLORIDE CRYS ER 20 MEQ PO TBCR: 40.0000 meq | EXTENDED_RELEASE_TABLET | Freq: Once | ORAL | Status: DC

## 2020-06-25 MED ORDER — LEVOTHYROXINE SODIUM 100 MCG PO TABS
100.0000 ug | ORAL_TABLET | Freq: Every day | ORAL | Status: DC
  Administered 2020-06-26 – 2020-07-01 (×4): 100 ug via ORAL
  Filled 2020-06-25 (×5): qty 1

## 2020-06-25 MED ORDER — QUETIAPINE FUMARATE 25 MG PO TABS
12.5000 mg | ORAL_TABLET | Freq: Every day | ORAL | Status: DC
  Administered 2020-06-25: 12.5 mg via ORAL
  Filled 2020-06-25: qty 0.5
  Filled 2020-06-25 (×2): qty 1

## 2020-06-25 MED ORDER — POLYETHYLENE GLYCOL 3350 17 G PO PACK: 17.0000 g | PACK | Freq: Every day | ORAL | Status: DC | PRN

## 2020-06-25 NOTE — ED Notes (Signed)
IV team at bedside 

## 2020-06-25 NOTE — ED Notes (Signed)
Neurology NP at bedside. Pt walking around in room. Removed all monitoring equipment. Pt very upset and confused. Walking with no difficulty or gait abnormality. Pt with equal strength bilaterally.

## 2020-06-25 NOTE — Progress Notes (Signed)
Received consult for IV start. No medications or tests ordered requiring IV access. Please consult again in the future if IV access is needed.

## 2020-06-25 NOTE — Progress Notes (Signed)
Pt has been aggressive and screaming. Unable to do a swallow test to give her meds. RN made MD Rathore aware of the situation and that the pt stated she needed to go see her husband because he died. Pt also saw other ppl in the room and was talking to them. Awaiting to see if pt will calm down to do the swallow test to give her meds.

## 2020-06-25 NOTE — Progress Notes (Addendum)
Patient ID: [name] [name], female   DOB: [birth year], 79 y.o.   MRN: [id number] BP 107/75   Pulse 79   Temp 98.6 F (37 C)   Resp 16   SpO2 100%   Films reviewed. This patient with known amyloid Angiopathy presents with a small intracerebral hemorrhage in the right occipital region. There is no indication for operative intervention. There is no midline shift, basal cisterns are widely patent, ventricles are not effaced. Neurology should be consulted.  Call if there are questions

## 2020-06-25 NOTE — ED Notes (Signed)
Pt to CT via stretcher

## 2020-06-25 NOTE — Consult Note (Signed)
Neurology Consultation  Reason for Consult: small ICH  Referring Physician: Dr. Jamse Arn  CC: worsening confusion  History is obtained from: medical record and patients son  HPI: [name] [name] is a 79 y.o. female with past medical history of cerebral amyloid angiopathy, HTN, HLD, hypothyroidism, GERD, anxiety/depression who presents to [facility] ED for worsening confusion.      [name] [name] is a 79 y.o. female with past medical history of cerebral amyloid angiopathy, HTN, HLD, hypothyroidism, GERD, anxiety/depression who had a recent admission at [facility] for a left occipital ICH.  She has also had  a recent admission to [facility] for metabolic encepahalopthy and discharged on 06/17/2020.  CT head during last admission revealed Improving hemorrhage in the left temporal occipital lobe.  EEG during last admission possible epilepsy and was started on Depakote.  Today she was found wandering around outside and was brought in to [facility] Ed.  Son is at bedside and states that [name] [name] is normal confused and "lives in the past" however she has become delusional her confusion became much worse on Saturday 06/24/2020.  He also states that she has refused to take any of her medicine for a few days now.  Today a CT head was done and revealed New intraparenchymal hemorrhage or hemorrhagic infarction at the right parietooccipital junction measuring 2.5 cm in diameter.  NSGY was consulted with no indication for surgical intervention and neurology was consulted   At this time during my examination patient is very confused, delusional believes she is at home and needs to answer some phone calls and appears to be agitated.  She is not able to give a coherent story nor will she allow me to examine her    ICH Score: 0   ROS:  Unable to obtain due to altered mental status.   Past Medical History:  Diagnosis Date  . Adenomatous colon polyp   . Anxiety   . Cataract 2013   bilat removed   .  DJD (degenerative joint disease) of lumbar spine   . Estrogen deficiency   . Gastritis   . GERD (gastroesophageal reflux disease)   . Headache(784.0)   . Helicobacter pylori infection 10/13/2007   Qualifier: Diagnosis of  By: Marland Mcalpine    . History of positive PPD 10/16/2011  . HSV-2 infection   . Hyperglycemia   . Hyperlipidemia    no meds now  . Hypertension   . IBS (irritable bowel syndrome)   . Medication side effect - Propofol (burning vein) and prvastatin (myalgia) 04/08/2013   Pravastatin patient went off and rechallenged and symptoms recurred muscle cramps /foot cramps   . Neuromuscular disorder (HCC)    muscle cramps  . Pulsatile tinnitus    had MRA and MPV nl mild carotid dopplers2010  . Shingles 02/09/2011   Right upper face and eyelid but not involving the eye at this point.   . Sleep apnea    does not use c pap  . Thyroid disease   . Vulvitis      Essential (primary) hypertension   Family History  Problem Relation Age of Onset  . Alzheimer's disease Mother   . Heart attack Father        31  . Asthma Sister   . Colon cancer Sister   . Heart attack Brother        14  . Colon polyps Sister   . Colon cancer Sister   . Colon cancer Paternal Aunt   .  Thyroid disease Sister        multinodular goiter  . Stomach cancer Neg Hx   . Breast cancer Neg Hx   . Rectal cancer Neg Hx      Social History:   reports that she quit smoking about 36 years ago. Her smoking use included cigarettes. She has a 11.50 pack-year smoking history. She has never used smokeless tobacco. She reports that she does not drink alcohol and does not use drugs.  Medications  Current Facility-Administered Medications:  .  LORazepam (ATIVAN) injection 0.5 mg, 0.5 mg, Intravenous, Once, Caccavale, Sophia, PA-C .  potassium chloride SA (KLOR-CON) CR tablet 40 mEq, 40 mEq, Oral, Once, Jamse Arn, Kyra Searles, MD  Current Outpatient Medications:  .  acetaminophen (TYLENOL) 500 MG  tablet, Take 1,000 mg by mouth every 6 (six) hours as needed for mild pain, moderate pain or headache. , Disp: , Rfl:  .  amLODipine (NORVASC) 2.5 MG tablet, Take 1 tablet (2.5 mg total) by mouth daily., Disp: 90 tablet, Rfl: 0 .  divalproex (DEPAKOTE) 500 MG DR tablet, Take 1 tablet (500 mg total) by mouth every 12 (twelve) hours., Disp: 180 tablet, Rfl: 0 .  levothyroxine (SYNTHROID) 100 MCG tablet, Take  5 days per week ( dosage change), Disp: 90 tablet, Rfl: 1 .  melatonin 3 MG TABS tablet, Take 6 mg by mouth at bedtime. , Disp: , Rfl:  .  QUEtiapine (SEROQUEL) 25 MG tablet, Take 12.5 mg by mouth at bedtime. May take an additional 1/2 tablet 2 times daily as needed for agitation, Disp: , Rfl:  .  thiamine 100 MG tablet, Take 100 mg by mouth daily., Disp: , Rfl:  .  vitamin B-12 1000 MCG tablet, Take 1 tablet (1,000 mcg total) by mouth daily., Disp: 90 tablet, Rfl: 0 .  famotidine (PEPCID) 20 MG tablet, Take 1 tablet (20 mg total) by mouth 2 (two) times daily. (Patient not taking: Reported on 06/25/2020), Disp: 180 tablet, Rfl: 1 .  fluticasone (FLONASE) 50 MCG/ACT nasal spray, Place 2 sprays into both nostrils as needed for allergies. FOR RHINITIS (Patient not taking: Reported on 06/25/2020), Disp: 16 mL, Rfl: 5   Exam: Current vital signs: BP 127/60 (BP Location: Right Arm)   Pulse 78   Temp 98.6 F (37 C)   Resp 18   SpO2 100%  Vital signs in last 24 hours: Temp:  [98.6 F (37 C)] 98.6 F (37 C) (11/14 0618) Pulse Rate:  [72-88] 78 (11/14 0935) Resp:  [16-18] 18 (11/14 0935) BP: (106-149)/(60-75) 127/60 (11/14 0935) SpO2:  [98 %-100 %] 100 % (11/14 0935)  Patient refused for me to do a full exam  GENERAL:  Confused, pacing around room, Awake, alert in NAD HEENT: - Normocephalic and atraumatic, dry mm,  LUNGS - refused for me to auscultate  CV - refused, removed EKG leads from monitor ABDOMEN - unable to assess  Ext: warm, well perfused, intact peripheral pulses, no  edema  NEURO:  Mental Status: Patient is oriented to name only  Language: speech is clear. Can name objects, unable to give a coherent history or comprehend current situation  Cranial Nerves: PERRL 2_mm/brisk. EOMI, visual fields full, no facial asymmetry facial sensation intact, hearing intact, tongue/uvula/soft palate midline, normal sternocleidomastoid and trapezius muscle strength. No evidence of tongue atrophy or fibrillations Motor: moves all 4 extremities, appears to be strong in all 4  Tone: is normal and bulk is normal Sensation- unable to assess Coordination: unable to assess, refusing  to cooperate  Gait- appears to be unsteady      Labs I have reviewed labs in epic and the results pertinent to this consultation are:  CBC    Component Value Date/Time   WBC 7.6 06/25/2020 0617   RBC 3.80 (L) 06/25/2020 0617   HGB 11.0 (L) 06/25/2020 0617   HCT 36.1 06/25/2020 0617   PLT 148 (L) 06/25/2020 0617   MCV 95.0 06/25/2020 0617   MCH 28.9 06/25/2020 0617   MCHC 30.5 06/25/2020 0617   RDW 12.5 06/25/2020 0617   LYMPHSABS 1.1 06/25/2020 0617   MONOABS 0.7 06/25/2020 0617   EOSABS 0.0 06/25/2020 0617   BASOSABS 0.0 06/25/2020 0617    CMP     Component Value Date/Time   NA 141 06/25/2020 0617   K 3.2 (L) 06/25/2020 0617   CL 104 06/25/2020 0617   CO2 24 06/25/2020 0617   GLUCOSE 115 (H) 06/25/2020 0617   BUN 20 06/25/2020 0617   CREATININE 0.93 06/25/2020 0617   CALCIUM 9.3 06/25/2020 0617   PROT 8.0 06/15/2020 1255   ALBUMIN 4.3 06/15/2020 1255   AST 28 06/15/2020 1255   ALT 19 06/15/2020 1255   ALKPHOS 47 06/15/2020 1255   BILITOT 0.4 06/15/2020 1255   GFRNONAA >60 06/25/2020 0617   GFRAA >60 11/24/2018 1315    Lipid Panel     Component Value Date/Time   CHOL 218 (H) 11/15/2019 0735   TRIG 125.0 11/15/2019 0735   HDL 55.90 11/15/2019 0735   CHOLHDL 4 11/15/2019 0735   VLDL 25.0 11/15/2019 0735   LDLCALC 137 (H) 11/15/2019 0735   LDLDIRECT 131.1  08/09/2009 0929     Imaging I have reviewed the images obtained  CT-scan of the brain New intraparenchymal hemorrhage or hemorrhagic infarction at the right parietooccipital junction measuring 2.5 cm in diameter. Resolving changes of a late subacute intraparenchymal hematoma at the left temporoparietal junction. No significant mass effect or midline shift. Known diagnosis of amyloid angiopathy. 2. Chronic small-vessel ischemic changes elsewhere throughout the brain.    Assessment:   JACE DOWE is a 79 y.o. female with past medical history of cerebral amyloid angiopathy, HTN, HLD, hypothyroidism, GERD, anxiety/depression who presents to [facility] ED for worsening confusion.    Today she was found wandering around outside and was brought in to [facility] Ed.  Son is at bedside and states that Mrs. Stroope is normal confused and "lives in the past" however she has become delusional her confusion became much worse on Saturday 06/24/2020.  He also states that she has refused to take any of her medicine for a few days now.  Today a CT head was done and revealed New intraparenchymal hemorrhage or hemorrhagic infarction at the right parietooccipital junction measuring 2.5 cm in diameter.  NSGY was consulted with no indication for surgical intervention and neurology was consulted   Acute Right ICH 2/2 cerebral amyloid   Recommendations: - SBP goal < 160 - No ASA or AC - Neuro checks - maintain normoglycemia - Check Ua, CXr  r/o infection  -  Check CMP, CBC - Continue home depakote - continue home seroquel - Continue home BP meds   Beulah Gandy, DNP

## 2020-06-25 NOTE — ED Provider Notes (Signed)
[facility] EMERGENCY DEPARTMENT Provider Note   CSN: [id number] Arrival date & time: 06/25/20  3976     History No chief complaint on file.   [name] [name] is a 79 y.o. female presenting for evaluation of altered mental status.  Level 5 caveat due to dementia and altered mental status.  Patient states she does not know why she is here.  She was told she is here because she is looking for employment and needs to be evaluated prior to starting work, however she does not think she is looking for a job.  She reports no complaints at this time, no pain.  Additional history obtained from triage note.  EMS was called due to patient wandering around the front yard.  Per chart review, patient has had increased confusion, recently mated for the same.  This was thought to be due to a UTI and delirium, however per family, this is worsened over the past several days.  Patient sounds are staying with her at her house, however patient has not slept in over 24 hours, and is increasingly getting confused.  Additionally, patient with a subdural in October 2021, not on blood thinners, thought to be due to amyloid angiopathy.  Additional history of hyperglycemia, hyperlipidemia, hypertension, IBS, dementia  HPI     Past Medical History:  Diagnosis Date  . Adenomatous colon polyp   . Anxiety   . Cataract 2013   bilat removed   . DJD (degenerative joint disease) of lumbar spine   . Estrogen deficiency   . Gastritis   . GERD (gastroesophageal reflux disease)   . Headache(784.0)   . Helicobacter pylori infection 10/13/2007   Qualifier: Diagnosis of  By: Marland Mcalpine    . History of positive PPD 10/16/2011  . HSV-2 infection   . Hyperglycemia   . Hyperlipidemia    no meds now  . Hypertension   . IBS (irritable bowel syndrome)   . Medication side effect - Propofol (burning vein) and prvastatin (myalgia) 04/08/2013   Pravastatin patient went off and rechallenged and  symptoms recurred muscle cramps /foot cramps   . Neuromuscular disorder (HCC)    muscle cramps  . Pulsatile tinnitus    had MRA and MPV nl mild carotid dopplers2010  . Shingles 02/09/2011   Right upper face and eyelid but not involving the eye at this point.   . Sleep apnea    does not use c pap  . Thyroid disease   . Vulvitis     Patient Active Problem List   Diagnosis Date Noted  . AMS (altered mental status) 06/15/2020  . UTI (urinary tract infection) 06/15/2020  . Essential hypertension 06/15/2020  . Dizziness 12/07/2018  . Educated about COVID-19 virus infection 12/07/2018  . Dermatofibroma 12/23/2017  . Osteoarthritis of right hip 10/02/2017  . Multinodular goiter 06/15/2015  . Post-surgical hypothyroidism 06/15/2015  . Leg pain, lateral 08/30/2014  . Hyperglycemia 11/05/2013  . Family history of Alzheimer's disease 11/05/2013  . Memory problem 11/05/2013  . Medication side effect - Propofol (burning vein) and prvastatin (myalgia) 04/08/2013  . Lesion of spleen 04/08/2013  . Arthritis of shoulder 11/03/2012  . History of back surgery 11/03/2012  . HSV-2 seropositive 02/17/2012  . Medicare annual wellness visit, subsequent 10/16/2011  . OSA (obstructive sleep apnea) 10/16/2011  . History of positive PPD 10/16/2011  . History of colonic polyps 07/18/2011  . Dyspnea 10/18/2010  . Heart burn 10/18/2010  . Chest pain 10/18/2010  .  ADJUSTMENT DISORDER WITH ANXIETY 07/24/2010  . CHEST WALL PAIN, ANTERIOR 07/24/2010  . KNEE PAIN 04/04/2010  . Disturbance in sleep behavior 11/21/2009  . LEG PAIN 02/28/2009  . TINNITUS 05/11/2008  . HEADACHE 02/07/2008  . UNSPECIFIED VITAMIN D DEFICIENCY 02/05/2008  . OBESITY, MILD 10/13/2007  . ANEMIA 10/13/2007  . GASTRITIS, CHRONIC 10/13/2007  . DUODENITIS, WITHOUT HEMORRHAGE 10/13/2007  . HIATAL HERNIA 10/13/2007  . IRRITABLE BOWEL SYNDROME 10/13/2007  . GRANULOMA 10/13/2007  . RHINITIS 09/23/2007  . FASTING HYPERGLYCEMIA  09/04/2007  . HYPERLIPIDEMIA 04/07/2007  . HYPERTENSION 04/07/2007  . GERD 04/07/2007    Past Surgical History:  Procedure Laterality Date  . ABDOMINAL HYSTERECTOMY  1982   fibroids  . COLONOSCOPY  2003- 2102   adenomatous polyps  . FINGER SURGERY     left middle A1pulley release  . isthmuectomy     thyroid surgery  . LUMBAR SPINE SURGERY  05/1989   spine d/t tumor neurofibroma Quentin Cornwall  . POLYPECTOMY    . SHOULDER ARTHROSCOPY Bilateral    left, right 2017  . THYROIDECTOMY     partial rt  ballen  . TONSILLECTOMY     at age 89.   Marland Kitchen TOTAL HIP ARTHROPLASTY Right 10/02/2017   Procedure: RIGHT TOTAL HIP ARTHROPLASTY ANTERIOR APPROACH;  Surgeon: Rod Can, MD;  Location: [facility];  Service: Orthopedics;  Laterality: Right;  Needs RNFA  . TUBAL LIGATION  1974     OB History    Gravida  3   Para  3   Term      Preterm      AB      Living        SAB      TAB      Ectopic      Multiple      Live Births              Family History  Problem Relation Age of Onset  . Alzheimer's disease Mother   . Heart attack Father        55  . Asthma Sister   . Colon cancer Sister   . Heart attack Brother        55  . Colon polyps Sister   . Colon cancer Sister   . Colon cancer Paternal Aunt   . Thyroid disease Sister        multinodular goiter  . Stomach cancer Neg Hx   . Breast cancer Neg Hx   . Rectal cancer Neg Hx     Social History   Tobacco Use  . Smoking status: Former Smoker    Packs/day: 0.50    Years: 23.00    Pack years: 11.50    Types: Cigarettes    Quit date: 06/16/1984    Years since quitting: 36.0  . Smokeless tobacco: Never Used  Vaping Use  . Vaping Use: Never used  Substance Use Topics  . Alcohol use: No  . Drug use: No    Home Medications Prior to Admission medications   Medication Sig Start Date End Date Taking? Authorizing Provider  acetaminophen (TYLENOL) 500 MG tablet Take 1,000 mg by mouth every 6 (six) hours as needed  for mild pain, moderate pain or headache.    Yes [provider]  amLODipine (NORVASC) 2.5 MG tablet Take 1 tablet (2.5 mg total) by mouth daily. 06/17/20 09/15/20 Yes Dahal, Marlowe Aschoff, MD  divalproex (DEPAKOTE) 500 MG DR tablet Take 1 tablet (500 mg total) by mouth every 12 (  twelve) hours. 06/17/20 09/15/20 Yes Dahal, Marlowe Aschoff, MD  levothyroxine (SYNTHROID) 100 MCG tablet Take  5 days per week ( dosage change) 06/23/20  Yes Panosh, Standley Brooking, MD  melatonin 3 MG TABS tablet Take 6 mg by mouth at bedtime.  06/08/20  Yes [provider]  QUEtiapine (SEROQUEL) 25 MG tablet Take 12.5 mg by mouth at bedtime. May take an additional 1/2 tablet 2 times daily as needed for agitation   Yes [provider]  thiamine 100 MG tablet Take 100 mg by mouth daily.   Yes [provider]  vitamin B-12 1000 MCG tablet Take 1 tablet (1,000 mcg total) by mouth daily. 06/18/20 09/16/20 Yes Dahal, Marlowe Aschoff, MD  famotidine (PEPCID) 20 MG tablet Take 1 tablet (20 mg total) by mouth 2 (two) times daily. Patient not taking: Reported on 06/25/2020 01/22/19   Panosh, Standley Brooking, MD  fluticasone [facility]) 50 MCG/ACT nasal spray Place 2 sprays into both nostrils as needed for allergies. FOR RHINITIS Patient not taking: Reported on 06/25/2020 12/03/19   Panosh, Standley Brooking, MD    Allergies    Lisinopril, Lyrica [pregabalin], Pravastatin, Simvastatin, Codeine phosphate, Oxycodone hcl, and Strawberry extract  Review of Systems   Review of Systems  Unable to perform ROS: Dementia    Physical Exam Updated Vital Signs BP 107/75   Pulse 79   Temp 98.6 F (37 C)   Resp 16   SpO2 100%   Physical Exam Vitals and nursing note reviewed.  Constitutional:      General: She is not in acute distress.    Appearance: She is well-developed.     Comments: Resting in the bed in no acute distress  HENT:     Head: Normocephalic and atraumatic.  Eyes:     Extraocular Movements: Extraocular movements intact.      Conjunctiva/sclera: Conjunctivae normal.     Pupils: Pupils are equal, round, and reactive to light.  Cardiovascular:     Rate and Rhythm: Normal rate and regular rhythm.     Pulses: Normal pulses.  Pulmonary:     Effort: Pulmonary effort is normal. No respiratory distress.     Breath sounds: Normal breath sounds. No wheezing.  Abdominal:     General: There is no distension.     Palpations: Abdomen is soft. There is no mass.     Tenderness: There is no abdominal tenderness. There is no guarding or rebound.  Musculoskeletal:        General: Normal range of motion.     Cervical back: Normal range of motion and neck supple.     Comments: Full active range of motion of all extremities.  Strength and sensation intact x4.  Skin:    General: Skin is warm and dry.     Capillary Refill: Capillary refill takes less than 2 seconds.     Comments: Small abrasion of the right great toe on the plantar surface.  No active bleeding.  Neurological:     Mental Status: She is alert.     GCS: GCS eye subscore is 4. GCS verbal subscore is 5. GCS motor subscore is 6.     Comments: Patient able to tell me her maiden name, not able to tell me her name.  Not oriented to place, time, or event.     ED Results / Procedures / Treatments   Labs (all labs ordered are listed, but only abnormal results are displayed) Labs Reviewed  CBC WITH DIFFERENTIAL/PLATELET - Abnormal; Notable for the  following components:      Result Value   RBC 3.80 (*)    Hemoglobin 11.0 (*)    Platelets 148 (*)    All other components within normal limits  BASIC METABOLIC PANEL - Abnormal; Notable for the following components:   Potassium 3.2 (*)    Glucose, Bld 115 (*)    All other components within normal limits  RESPIRATORY PANEL BY RT PCR (FLU A&B, COVID)  URINALYSIS, ROUTINE W REFLEX MICROSCOPIC  VALPROIC ACID LEVEL    EKG None  Radiology CT Head Wo Contrast  Result Date: 06/25/2020 CLINICAL DATA:  Mental status  changes of unknown etiology. Confusion. EXAM: CT HEAD WITHOUT CONTRAST TECHNIQUE: Contiguous axial images were obtained from the base of the skull through the vertex without intravenous contrast. COMPARISON:  06/15/2020 FINDINGS: Brain: New intraparenchymal hemorrhage or hemorrhagic infarction at the right parietooccipital junction measuring 2.5 cm in diameter. Resolving changes of a late subacute intraparenchymal hematoma at the left temporoparietal junction. No significant mass effect or midline shift. Chronic small-vessel ischemic changes elsewhere throughout the brain as outlined above. No hydrocephalus. No extra-axial collection. Vascular: There is atherosclerotic calcification of the major vessels at the base of the brain. Skull: Normal Sinuses/Orbits: Clear/normal Other: None IMPRESSION: 1. New intraparenchymal hemorrhage or hemorrhagic infarction at the right parietooccipital junction measuring 2.5 cm in diameter. Resolving changes of a late subacute intraparenchymal hematoma at the left temporoparietal junction. No significant mass effect or midline shift. Known diagnosis of amyloid angiopathy. 2. Chronic small-vessel ischemic changes elsewhere throughout the brain. Critical Value/emergent results were called by telephone at the time of interpretation on 06/25/2020 at 7:38 am to provider Sanford Mayville , who verbally acknowledged these results. Electronically Signed   By: Nelson Chimes M.D.   On: 06/25/2020 07:39    Procedures .Critical Care Performed by: Franchot Heidelberg, PA-C Authorized by: Franchot Heidelberg, PA-C   Critical care provider statement:    Critical care time (minutes):  45   Critical care time was exclusive of:  Separately billable procedures and treating other patients and teaching time   Critical care was necessary to treat or prevent imminent or life-threatening deterioration of the following conditions:  CNS failure or compromise   Critical care was time spent personally by  me on the following activities:  Blood draw for specimens, development of treatment plan with patient or surrogate, discussions with consultants, examination of patient, obtaining history from patient or surrogate, ordering and performing treatments and interventions, ordering and review of laboratory studies, ordering and review of radiographic studies, pulse oximetry, re-evaluation of patient's condition and review of old charts   I assumed direction of critical care for this patient from another provider in my specialty: no   Comments:     Patient with new intraparenchymal hemorrhage.  Will need admission.   (including critical care time)  Medications Ordered in ED Medications - No data to display  ED Course  I have reviewed the triage vital signs and the nursing notes.  Pertinent labs & imaging results that were available during my care of the patient were reviewed by me and considered in my medical decision making (see chart for details).    MDM Rules/Calculators/A&P                          Patient presenting for evaluation of worsening mental status.  On exam, patient is confused.  This appears worse today than he was during her recent discharge.  She will need repeat labs for evaluation of altered mental status.  Due to her history of recent subdural, she will need a CT head.  Urine to assess for UTI.  Based on patient's presentation, does not appear that she is safe at home, she will either need admission or placement.  Labs interpreted by me, overall reassuring.  Hemoglobin at baseline.  Mild hypokalemia 3.2.  CT head shows new intraparenchymal hemorrhage 2.5 cm without mass-effect.  Previous subdural improved.  I discussed findings with patient and son.  Will consult with neurosurgery.  I discussed with Dr. Christella Noa from neurosurgery, who had no further recommendations at this time.  Patient to be admitted to medicine.  Discussed with Dr. Harlen Labs, pt to be admitted.   Final  Clinical Impression(s) / ED Diagnoses Final diagnoses:  Intraparenchymal hematoma of brain, right, without loss of consciousness, initial encounter ([place])  Altered mental status, unspecified altered mental status type    Rx / DC Orders ED Discharge Orders    None       Franchot Heidelberg, PA-C 06/25/20 0913    Isla Pence, MD 06/25/20 1124

## 2020-06-25 NOTE — H&P (Addendum)
History and Physical:    [name] [name]   FIE:[id number] DOB: 11-25-1940 DOA: 06/25/2020  Referring MD/provider: Franchot Heidelberg, PA PCP: Burnis Medin, MD   Patient coming from: Home  Chief Complaint: Worsening mental status  History of Present Illness:   [name] [name] is an 79 y.o. female with PMH significant for amyloid angiopathy presenting with left occipital IPH on 05/26/2020 at [facility], HTN and dyslipidemia who was recently discharged from [facility] on 06/17/2020 after treatment for worsened metabolic encephalopathy who now presents with worsened metabolic encephalopathy and 2.5 cm intraparenchymal hemorrhage at the right parieto-occipital junction.  History is entirely per patient's son who was at bedside.  Patient son states that patient was in her usual state of health which is usually pleasantly confused "she lives in the past most of the times, she has good days and bad days but most of the time she is living in the past" until yesterday when she was noted to be somewhat more agitated than usual.  Apparently she woke up earlier than usual and spent the whole day moving around the house and taking things in and out of cabinet, refusing to take her meds and generally being more agitated.  Early this morning she was found roaming around outside the house and was brought into ED.  Patient is alert and talkative however is not able to give me a coherent history.  She believes that she is taking care of her mother who is not doing very well.  She notes "it is in the hands of the Lord".  ED Course:  The patient was noted to be afebrile and normotensive.  Laboratory was notable only for a potassium of 3.2.  Head CT however showed a 2.5 cm new intracranial hemorrhage as noted above.  Patient was discussed with neurosurgery who recommended admission by hospital service for observation and consultation with neurology.  He notes specifically there is no neurosurgical  need for intervention at present.  ROS:   ROS   Review of Systems: Patient unable to provide due to delirium/dementia  Past Medical History:   Past Medical History:  Diagnosis Date  . Adenomatous colon polyp   . Anxiety   . Cataract 2013   bilat removed   . DJD (degenerative joint disease) of lumbar spine   . Estrogen deficiency   . Gastritis   . GERD (gastroesophageal reflux disease)   . Headache(784.0)   . Helicobacter pylori infection 10/13/2007   Qualifier: Diagnosis of  By: Marland Mcalpine    . History of positive PPD 10/16/2011  . HSV-2 infection   . Hyperglycemia   . Hyperlipidemia    no meds now  . Hypertension   . IBS (irritable bowel syndrome)   . Medication side effect - Propofol (burning vein) and prvastatin (myalgia) 04/08/2013   Pravastatin patient went off and rechallenged and symptoms recurred muscle cramps /foot cramps   . Neuromuscular disorder (HCC)    muscle cramps  . Pulsatile tinnitus    had MRA and MPV nl mild carotid dopplers2010  . Shingles 02/09/2011   Right upper face and eyelid but not involving the eye at this point.   . Sleep apnea    does not use c pap  . Thyroid disease   . Vulvitis     Past Surgical History:   Past Surgical History:  Procedure Laterality Date  . ABDOMINAL HYSTERECTOMY  1982   fibroids  . COLONOSCOPY  2003- 2102  adenomatous polyps  . FINGER SURGERY     left middle A1pulley release  . isthmuectomy     thyroid surgery  . LUMBAR SPINE SURGERY  05/1989   spine d/t tumor neurofibroma Quentin Cornwall  . POLYPECTOMY    . SHOULDER ARTHROSCOPY Bilateral    left, right 2017  . THYROIDECTOMY     partial rt  ballen  . TONSILLECTOMY     at age 50.   Marland Kitchen TOTAL HIP ARTHROPLASTY Right 10/02/2017   Procedure: RIGHT TOTAL HIP ARTHROPLASTY ANTERIOR APPROACH;  Surgeon: Rod Can, MD;  Location: [facility];  Service: Orthopedics;  Laterality: Right;  Needs RNFA  . TUBAL LIGATION  1974    Social History:   Social History    Socioeconomic History  . Marital status: Single    Spouse name: widowed.   . Number of children: Y  . Years of education: Not on file  . Highest education level: Not on file  Occupational History  . Occupation: retired.    Comment: [employer]----now retired.    Employer: RETIRED  Tobacco Use  . Smoking status: Former Smoker    Packs/day: 0.50    Years: 23.00    Pack years: 11.50    Types: Cigarettes    Quit date: 06/16/1984    Years since quitting: 36.0  . Smokeless tobacco: Never Used  Vaping Use  . Vaping Use: Never used  Substance and Sexual Activity  . Alcohol use: No  . Drug use: No  . Sexual activity: Not Currently    Partners: Male    Birth control/protection: Surgical  Other Topics Concern  . Not on file  Social History Narrative   Retired    former smoker quit 1985   Widowed fall 2010 from myeloma. Lives alone.       Right handed   Social Determinants of Health   Financial Resource Strain: Low Risk   . Difficulty of Paying Living Expenses: Not hard at all  Food Insecurity: No Food Insecurity  . Worried About Charity fundraiser in the Last Year: Never true  . Ran Out of Food in the Last Year: Never true  Transportation Needs: No Transportation Needs  . Lack of Transportation (Medical): No  . Lack of Transportation (Non-Medical): No  Physical Activity: Insufficiently Active  . Days of Exercise per Week: 7 days  . Minutes of Exercise per Session: 20 min  Stress: No Stress Concern Present  . Feeling of Stress : Only a little  Social Connections: Moderately Integrated  . Frequency of Communication with Friends and Family: More than three times a week  . Frequency of Social Gatherings with Friends and Family: Once a week  . Attends Religious Services: More than 4 times per year  . Active Member of Clubs or Organizations: Yes  . Attends Archivist Meetings: More than 4 times per year  . Marital Status: Widowed  Intimate Partner Violence: Not At Risk   . Fear of Current or Ex-Partner: No  . Emotionally Abused: No  . Physically Abused: No  . Sexually Abused: No    Allergies   Lisinopril, Lyrica [pregabalin], Pravastatin, Simvastatin, Codeine phosphate, Oxycodone hcl, and Strawberry extract  Family history:   Family History  Problem Relation Age of Onset  . Alzheimer's disease Mother   . Heart attack Father        54  . Asthma Sister   . Colon cancer Sister   . Heart attack Brother  30  . Colon polyps Sister   . Colon cancer Sister   . Colon cancer Paternal Aunt   . Thyroid disease Sister        multinodular goiter  . Stomach cancer Neg Hx   . Breast cancer Neg Hx   . Rectal cancer Neg Hx     Current Medications:   Prior to Admission medications   Medication Sig Start Date End Date Taking? Authorizing Provider  acetaminophen (TYLENOL) 500 MG tablet Take 1,000 mg by mouth every 6 (six) hours as needed for mild pain, moderate pain or headache.    Yes [provider]  amLODipine (NORVASC) 2.5 MG tablet Take 1 tablet (2.5 mg total) by mouth daily. 06/17/20 09/15/20 Yes Dahal, Marlowe Aschoff, MD  divalproex (DEPAKOTE) 500 MG DR tablet Take 1 tablet (500 mg total) by mouth every 12 (twelve) hours. 06/17/20 09/15/20 Yes Dahal, Marlowe Aschoff, MD  levothyroxine (SYNTHROID) 100 MCG tablet Take  5 days per week ( dosage change) 06/23/20  Yes Panosh, Standley Brooking, MD  melatonin 3 MG TABS tablet Take 6 mg by mouth at bedtime.  06/08/20  Yes [provider]  QUEtiapine (SEROQUEL) 25 MG tablet Take 12.5 mg by mouth at bedtime. May take an additional 1/2 tablet 2 times daily as needed for agitation   Yes [provider]  thiamine 100 MG tablet Take 100 mg by mouth daily.   Yes [provider]  vitamin B-12 1000 MCG tablet Take 1 tablet (1,000 mcg total) by mouth daily. 06/18/20 09/16/20 Yes Dahal, Marlowe Aschoff, MD  famotidine (PEPCID) 20 MG tablet Take 1 tablet (20 mg total) by mouth 2 (two) times daily. Patient not taking:  Reported on 06/25/2020 01/22/19   Panosh, Standley Brooking, MD  fluticasone [facility]) 50 MCG/ACT nasal spray Place 2 sprays into both nostrils as needed for allergies. FOR RHINITIS Patient not taking: Reported on 06/25/2020 12/03/19   Burnis Medin, MD    Physical Exam:   Vitals:   06/25/20 0618 06/25/20 0654 06/25/20 0700  BP: 106/67 (!) 149/63 107/75  Pulse: 88 72 79  Resp: 18 16   Temp: 98.6 F (37 C)    SpO2: 98% 99% 100%     Physical Exam: Blood pressure 107/75, pulse 79, temperature 98.6 F (37 C), resp. rate 16, SpO2 100 %. Gen: Pleasant  female with labile emotions lying in bed chatting with me and her son in no acute distress. Eyes: sclera anicteric, conjuctiva mildly injected bilaterally CVS: S1-S2, regulary, no gallops Respiratory:  decreased air entry likely secondary to decreased inspiratory effort GI: NABS, soft, NT  LE: No edema. No cyanosis   Data Review:    Labs: Basic Metabolic Panel: Recent Labs  Lab 06/25/20 0617  NA 141  K 3.2*  CL 104  CO2 24  GLUCOSE 115*  BUN 20  CREATININE 0.93  CALCIUM 9.3   Liver Function Tests: No results for input(s): AST, ALT, ALKPHOS, BILITOT, PROT, ALBUMIN in the last 168 hours. No results for input(s): LIPASE, AMYLASE in the last 168 hours. No results for input(s): AMMONIA in the last 168 hours. CBC: Recent Labs  Lab 06/25/20 0617  WBC 7.6  NEUTROABS 5.7  HGB 11.0*  HCT 36.1  MCV 95.0  PLT 148*   Cardiac Enzymes: No results for input(s): CKTOTAL, CKMB, CKMBINDEX, TROPONINI in the last 168 hours.  BNP (last 3 results) No results for input(s): PROBNP in the last 8760 hours. CBG: No results for input(s): GLUCAP in the last 168 hours.  Radiographic Studies: CT Head Wo Contrast  Result Date: 06/25/2020 CLINICAL DATA:  Mental status changes of unknown etiology. Confusion. EXAM: CT HEAD WITHOUT CONTRAST TECHNIQUE: Contiguous axial images were obtained from the base of the skull through the vertex without  intravenous contrast. COMPARISON:  06/15/2020 FINDINGS: Brain: New intraparenchymal hemorrhage or hemorrhagic infarction at the right parietooccipital junction measuring 2.5 cm in diameter. Resolving changes of a late subacute intraparenchymal hematoma at the left temporoparietal junction. No significant mass effect or midline shift. Chronic small-vessel ischemic changes elsewhere throughout the brain as outlined above. No hydrocephalus. No extra-axial collection. Vascular: There is atherosclerotic calcification of the major vessels at the base of the brain. Skull: Normal Sinuses/Orbits: Clear/normal Other: None IMPRESSION: 1. New intraparenchymal hemorrhage or hemorrhagic infarction at the right parietooccipital junction measuring 2.5 cm in diameter. Resolving changes of a late subacute intraparenchymal hematoma at the left temporoparietal junction. No significant mass effect or midline shift. Known diagnosis of amyloid angiopathy. 2. Chronic small-vessel ischemic changes elsewhere throughout the brain. Critical Value/emergent results were called by telephone at the time of interpretation on 06/25/2020 at 7:38 am to provider [facility] , who verbally acknowledged these results. Electronically Signed   By: Nelson Chimes M.D.   On: 06/25/2020 07:39    EKG: Ordered and pending   Assessment/Plan:   Principal Problem:   Cerebral hemorrhage(nontraumatic) (HCC) Active Problems:   OSA (obstructive sleep apnea)   Essential hypertension   Cerebral amyloid angiopathy (CODE)  Acute intracranial hemorrhage Per neurosurgery note, no acute surgical intervention at present BP is under reasonable control Avoid antiplatelet agents or anticoagulation Neurology consult requested per neurosurgery recommendations  Acutely worsened confusion (baseline ongoing delirium vs dementia vs both) Continue quetiapine per home doses  Will place TED hose rather than SCD as SCD may worsen agitation Of note UA and EKG  have still not been done, these will need to be reviewed to ensure no other causes of worsen delirium other than her known new intracranial hemorrhage.  Seizure disorder Patient apparently noted to have epileptiform complexes on EEG at last admission No clear seizure was observed the patient was possibly noted in postictal state Patient's behavior apparently improved with addition of divalproex Continue divalproex  Goals for care Discussed with son Patient does not have a living will, he will discuss with his family  HTN Continue amlodipine 2.5 daily, BP is reasonably controlled at present  Hypothyroidism Continue Synthroid    Other information:   DVT prophylaxis: TED hose ordered. Code Status: Full Family Communication: Son was at bedside throughout Disposition Plan: Likely home Consults called: Neurosurgery, neurology Admission status: Inpatient  [place] [facility]  If 7PM-7AM, please contact night-coverage [email or web address] Password [facility] 06/25/2020, 9:32 AM

## 2020-06-25 NOTE — ED Notes (Signed)
Attempted to call report

## 2020-06-25 NOTE — ED Notes (Signed)
Sophia PA at bedside.

## 2020-06-25 NOTE — ED Notes (Signed)
Admitting at bedside 

## 2020-06-25 NOTE — ED Notes (Signed)
Pt back from CT

## 2020-06-25 NOTE — ED Triage Notes (Signed)
Patient arrived with EMS from home , neighbor called EMS, they  found patient wandering at their front ward . History of dementia , confused and disoriented at arrival .

## 2020-06-25 NOTE — ED Notes (Signed)
Pt refusing blood draw at this time.

## 2020-06-26 ENCOUNTER — Other Ambulatory Visit (HOSPITAL_COMMUNITY): Payer: Medicare Other

## 2020-06-26 DIAGNOSIS — G9341 Metabolic encephalopathy: Secondary | ICD-10-CM

## 2020-06-26 DIAGNOSIS — G40909 Epilepsy, unspecified, not intractable, without status epilepticus: Secondary | ICD-10-CM | POA: Diagnosis not present

## 2020-06-26 DIAGNOSIS — I10 Essential (primary) hypertension: Secondary | ICD-10-CM | POA: Diagnosis not present

## 2020-06-26 DIAGNOSIS — I680 Cerebral amyloid angiopathy: Secondary | ICD-10-CM | POA: Diagnosis not present

## 2020-06-26 DIAGNOSIS — E039 Hypothyroidism, unspecified: Secondary | ICD-10-CM

## 2020-06-26 DIAGNOSIS — I619 Nontraumatic intracerebral hemorrhage, unspecified: Secondary | ICD-10-CM | POA: Diagnosis not present

## 2020-06-26 LAB — BASIC METABOLIC PANEL
Anion gap: 13 (ref 5–15)
BUN: 18 mg/dL (ref 8–23)
CO2: 24 mmol/L (ref 22–32)
Calcium: 8.7 mg/dL — ABNORMAL LOW (ref 8.9–10.3)
Chloride: 107 mmol/L (ref 98–111)
Creatinine, Ser: 0.91 mg/dL (ref 0.44–1.00)
GFR, Estimated: >60 mL/min (ref >60)
Glucose, Bld: 83 mg/dL (ref 70–99)
Potassium: 4.1 mmol/L (ref 3.5–5.1)
Sodium: 144 mmol/L (ref 135–145)

## 2020-06-26 LAB — CBC
HCT: 34.2 % — ABNORMAL LOW (ref 36.0–46.0)
Hemoglobin: 11.1 g/dL — ABNORMAL LOW (ref 12.0–15.0)
MCH: 29.8 pg (ref 26.0–34.0)
MCHC: 32.5 g/dL (ref 30.0–36.0)
MCV: 91.7 fL (ref 80.0–100.0)
Platelets: UNDETERMINED 10*3/uL (ref 150–400)
RBC: 3.73 MIL/uL — ABNORMAL LOW (ref 3.87–5.11)
RDW: 12.6 % (ref 11.5–15.5)
WBC: 7.9 10*3/uL (ref 4.0–10.5)
nRBC: 0 % (ref 0.0–0.2)

## 2020-06-26 MED ORDER — QUETIAPINE FUMARATE 25 MG PO TABS
12.5000 mg | ORAL_TABLET | Freq: Two times a day (BID) | ORAL | Status: DC
  Administered 2020-06-27 – 2020-07-03 (×8): 12.5 mg via ORAL
  Filled 2020-06-26 (×13): qty 1

## 2020-06-26 MED ORDER — QUETIAPINE FUMARATE 25 MG PO TABS: 12.5000 mg | ORAL_TABLET | Freq: Two times a day (BID) | ORAL | Status: DC

## 2020-06-26 MED ORDER — HALOPERIDOL LACTATE 5 MG/ML IJ SOLN
2.0000 mg | Freq: Once | INTRAMUSCULAR | Status: AC
  Administered 2020-06-26: 2 mg via INTRAMUSCULAR

## 2020-06-26 MED ORDER — HALOPERIDOL LACTATE 5 MG/ML IJ SOLN
INTRAMUSCULAR | Status: AC
  Filled 2020-06-26: qty 1

## 2020-06-26 MED ORDER — HALOPERIDOL 0.5 MG PO TABS: 2.0000 mg | ORAL_TABLET | Freq: Once | ORAL | Status: AC

## 2020-06-26 MED ORDER — STROKE: EARLY STAGES OF RECOVERY BOOK: Freq: Once | Status: AC

## 2020-06-26 MED ORDER — STROKE: EARLY STAGES OF RECOVERY BOOK
Status: AC
  Filled 2020-06-26: qty 1

## 2020-06-26 NOTE — Assessment & Plan Note (Signed)
Continue Synthroid °

## 2020-06-26 NOTE — Progress Notes (Signed)
Spoke to RN, patient is non-cooperative with testing at this time. Will re-attempt echo tomorrow after patient has time to settle in.  [facility]

## 2020-06-26 NOTE — Plan of Care (Signed)
Pt alert and confused. See other notes. Pt went to sleep after taken medications crushed in applesauce. CT on hold because of pt's state.  Problem: Education: Goal: Knowledge of disease or condition will improve Outcome: Progressing Goal: Knowledge of secondary prevention will improve Outcome: Progressing Goal: Knowledge of patient specific risk factors addressed and post discharge goals established will improve Outcome: Progressing   Problem: Intracerebral Hemorrhage Tissue Perfusion: Goal: Complications of Intracerebral Hemorrhage will be minimized Outcome: Progressing   Problem: Education: Goal: Knowledge of General Education information will improve Description: Including pain rating scale, medication(s)/side effects and non-pharmacologic comfort measures Outcome: Progressing   Problem: Health Behavior/Discharge Planning: Goal: Ability to manage health-related needs will improve Outcome: Progressing   Problem: Clinical Measurements: Goal: Ability to maintain clinical measurements within normal limits will improve Outcome: Progressing Goal: Will remain free from infection Outcome: Progressing Goal: Diagnostic test results will improve Outcome: Progressing Goal: Respiratory complications will improve Outcome: Progressing Goal: Cardiovascular complication will be avoided Outcome: Progressing   Problem: Activity: Goal: Risk for activity intolerance will decrease Outcome: Progressing   Problem: Nutrition: Goal: Adequate nutrition will be maintained Outcome: Progressing   Problem: Coping: Goal: Level of anxiety will decrease Outcome: Progressing   Problem: Elimination: Goal: Will not experience complications related to bowel motility Outcome: Progressing Goal: Will not experience complications related to urinary retention Outcome: Progressing   Problem: Pain Managment: Goal: General experience of comfort will improve Outcome: Progressing   Problem: Safety: Goal:  Ability to remain free from injury will improve Outcome: Progressing   Problem: Skin Integrity: Goal: Risk for impaired skin integrity will decrease Outcome: Progressing

## 2020-06-26 NOTE — Progress Notes (Signed)
PROGRESS NOTE    [name] [name]   YKD:[id number]  DOB: 1941-02-25  DOA: 06/25/2020     1  PCP: Burnis Medin, MD  CC: Worsening confusion and agitation at home  Hospital Course: [name] [name] is an 79 y.o. female with PMH significant for amyloid angiopathy who presented with left occipital IPH on 05/26/2020 at [facility], HTN and dyslipidemia who was recently discharged from [facility] on 06/17/2020 after treatment for worsened metabolic encephalopathy who now presented with worsened metabolic encephalopathy and 2.5 cm intraparenchymal hemorrhage at the right parieto-occipital junction.  Patient was unable to provide any meaningful history on admission.  She is cared for by her son at home.  She is noted to be typically confused on a normal day and "lives in the past" however was noted to become more agitated than usual, therefore was brought to the ER. In the ER she underwent CT head which showed a 2.5 cm new intracranial hemorrhage. Patient was discussed with neurosurgery who recommended admission by hospital service for observation and consultation with neurology.  He notes specifically there is no neurosurgical need for intervention at present.  An EEG was attempted on 06/26/2020 however patient was irritated/agitated and did not cooperate with the procedure, therefore it was postponed.   Interval History:  Patient admitted overnight with a change in her baseline mentation.  She has typically confused but son notes that she appeared more confused than usual and was more agitated.  She has declined and refused EEG this morning.  CT head was also unable to be obtained yet due to her agitation. During my assessment, she was however calm and did follow some commands but mentation was very slow.  Old records reviewed in assessment of this patient  ROS: Review of systems not obtained due to patient factors.  Slowed mentation and cognitive impairment  Assessment &  Plan: * Cerebral hemorrhage(nontraumatic) (HCC) - New intraparenchymal hemorrhage or hemorrhagic infarction at the right parietooccipital junction measuring 2.5 cm in diameter. Resolving changes of a late subacute intraparenchymal hematoma at the left temporoparietal junction. No significant mass effect or midline shift. Known diagnosis of amyloid angiopathy. -No surgical intervention indicated per neurosurgery -Repeat CT head has been ordered for 06/26/2020 but patient uncooperative for now  Acute metabolic encephalopathy -Multifactorial in setting of underlying dementia and amyloid angiopathy.  Possible superimposed delirium or even infection -Follow-up UA, still awaiting collection -Continue Seroquel -Obtain EKG for baseline QTC -If still agitated, will try to use low-dose Haldol  Seizure disorder ([place]) Patient apparently noted to have epileptiform complexes on EEG at last admission No clear seizure was observed the patient was possibly noted in postictal state Patient's behavior apparently improved with addition of divalproex Continue divalproex  Hypothyroidism -Continue Synthroid  Essential hypertension -Continue amlodipine    Antimicrobials: None  DVT prophylaxis: TED hose; SCDs were considered to agitate her on admission Code Status: Full Family Communication: None present this morning Disposition Plan: Status is: Inpatient  Remains inpatient appropriate because:Altered mental status, Ongoing diagnostic testing needed not appropriate for outpatient work up and Inpatient level of care appropriate due to severity of illness   Dispo: The patient is from: Home              Anticipated d/c is to: Home              Anticipated d/c date is: 2 days              Patient currently is  not medically stable to d/c.       Objective: Blood pressure (!) 119/49, pulse 67, temperature 98.1 F (36.7 C), temperature source Oral, resp. rate 18, SpO2 100 %.   Examination: General appearance: Chronically ill-appearing elderly woman lying in bed in no distress and appears comfortable Head: Normocephalic, without obvious abnormality, atraumatic Eyes: EOMI Lungs: clear to auscultation bilaterally Heart: regular rate and rhythm and S1, S2 normal Abdomen: normal findings: bowel sounds normal and soft, non-tender Extremities: No edema Skin: mobility and turgor normal Neurologic: Moves all 4 extremities.  Slowed mentation but follows some commands  Consultants:   Neurosurgery  Neurology  Procedures:   06/26/2020: EEG attempted but patient did not cooperate  Data Reviewed: I have personally reviewed following labs and imaging studies Results for orders placed or performed during the hospital encounter of 06/25/20 (from the past 24 hour(s))  Valproic acid level     Status: None   Collection Time: 06/25/20 12:08 PM  Result Value Ref Range   Valproic Acid Lvl 66 50.0 - 100.0 ug/mL  Basic metabolic panel     Status: Abnormal   Collection Time: 06/26/20  4:55 AM  Result Value Ref Range   Sodium 144 135 - 145 mmol/L   Potassium 4.1 3.5 - 5.1 mmol/L   Chloride 107 98 - 111 mmol/L   CO2 24 22 - 32 mmol/L   Glucose, Bld 83 70 - 99 mg/dL   BUN 18 8 - 23 mg/dL   Creatinine, Ser 0.91 0.44 - 1.00 mg/dL   Calcium 8.7 (L) 8.9 - 10.3 mg/dL   GFR, Estimated >60 >60 mL/min   Anion gap 13 5 - 15  CBC     Status: Abnormal   Collection Time: 06/26/20  4:55 AM  Result Value Ref Range   WBC 7.9 4.0 - 10.5 K/uL   RBC 3.73 (L) 3.87 - 5.11 MIL/uL   Hemoglobin 11.1 (L) 12.0 - 15.0 g/dL   HCT 34.2 (L) 36 - 46 %   MCV 91.7 80.0 - 100.0 fL   MCH 29.8 26.0 - 34.0 pg   MCHC 32.5 30.0 - 36.0 g/dL   RDW 12.6 11.5 - 15.5 %   Platelets PLATELET CLUMPS NOTED ON SMEAR, UNABLE TO ESTIMATE 150 - 400 K/uL   nRBC 0.0 0.0 - 0.2 %    Recent Results (from the past 240 hour(s))  Respiratory Panel by RT PCR (Flu A&B, Covid) - Nasopharyngeal Swab     Status: None    Collection Time: 06/25/20  9:07 AM   Specimen: Nasopharyngeal Swab  Result Value Ref Range Status   SARS Coronavirus 2 by RT PCR NEGATIVE NEGATIVE Final    Comment: (NOTE) SARS-CoV-2 target nucleic acids are NOT DETECTED.  The SARS-CoV-2 RNA is generally detectable in upper respiratoy specimens during the acute phase of infection. The lowest concentration of SARS-CoV-2 viral copies this assay can detect is 131 copies/mL. A negative result does not preclude SARS-Cov-2 infection and should not be used as the sole basis for treatment or other patient management decisions. A negative result may occur with  improper specimen collection/handling, submission of specimen other than nasopharyngeal swab, presence of viral mutation(s) within the areas targeted by this assay, and inadequate number of viral copies (<131 copies/mL). A negative result must be combined with clinical observations, patient history, and epidemiological information. The expected result is Negative.  Fact Sheet for Patients:  PinkCheek.be  Fact Sheet for Healthcare Providers:  GravelBags.it  This test is no t yet  approved or cleared by the [place] and  has been authorized for detection and/or diagnosis of SARS-CoV-2 by FDA under an Emergency Use Authorization (EUA). This EUA will remain  in effect (meaning this test can be used) for the duration of the COVID-19 declaration under Section 564(b)(1) of the Act, 21 U.S.C. section 360bbb-3(b)(1), unless the authorization is terminated or revoked sooner.     Influenza A by PCR NEGATIVE NEGATIVE Final   Influenza B by PCR NEGATIVE NEGATIVE Final    Comment: (NOTE) The Xpert Xpress SARS-CoV-2/FLU/RSV assay is intended as an aid in  the diagnosis of influenza from Nasopharyngeal swab specimens and  should not be used as a sole basis for treatment. Nasal washings and  aspirates are unacceptable for Xpert  Xpress SARS-CoV-2/FLU/RSV  testing.  Fact Sheet for Patients: PinkCheek.be  Fact Sheet for Healthcare Providers: GravelBags.it  This test is not yet approved or cleared by the [place] FDA and  has been authorized for detection and/or diagnosis of SARS-CoV-2 by  FDA under an Emergency Use Authorization (EUA). This EUA will remain  in effect (meaning this test can be used) for the duration of the  Covid-19 declaration under Section 564(b)(1) of the Act, 21  U.S.C. section 360bbb-3(b)(1), unless the authorization is  terminated or revoked. Performed at [facility], [place] [address], [place], [place] [address]      Radiology Studies: CT Head Wo Contrast  Result Date: 06/25/2020 CLINICAL DATA:  Mental status changes of unknown etiology. Confusion. EXAM: CT HEAD WITHOUT CONTRAST TECHNIQUE: Contiguous axial images were obtained from the base of the skull through the vertex without intravenous contrast. COMPARISON:  06/15/2020 FINDINGS: Brain: New intraparenchymal hemorrhage or hemorrhagic infarction at the right parietooccipital junction measuring 2.5 cm in diameter. Resolving changes of a late subacute intraparenchymal hematoma at the left temporoparietal junction. No significant mass effect or midline shift. Chronic small-vessel ischemic changes elsewhere throughout the brain as outlined above. No hydrocephalus. No extra-axial collection. Vascular: There is atherosclerotic calcification of the major vessels at the base of the brain. Skull: Normal Sinuses/Orbits: Clear/normal Other: None IMPRESSION: 1. New intraparenchymal hemorrhage or hemorrhagic infarction at the right parietooccipital junction measuring 2.5 cm in diameter. Resolving changes of a late subacute intraparenchymal hematoma at the left temporoparietal junction. No significant mass effect or midline shift. Known diagnosis of amyloid angiopathy. 2. Chronic  small-vessel ischemic changes elsewhere throughout the brain. Critical Value/emergent results were called by telephone at the time of interpretation on 06/25/2020 at 7:38 am to provider [facility] , who verbally acknowledged these results. Electronically Signed   By: Nelson Chimes M.D.   On: 06/25/2020 07:39   CT Head Wo Contrast  Final Result    CT HEAD WO CONTRAST    (Results Pending)    Scheduled Meds: . amLODipine  2.5 mg Oral Daily  . divalproex  500 mg Oral Q12H  . levothyroxine  100 mcg Oral Q0600  . melatonin  6 mg Oral QHS  . potassium chloride  40 mEq Oral Once  . QUEtiapine  12.5 mg Oral QHS   PRN Meds: acetaminophen, polyethylene glycol Continuous Infusions:   LOS: 1 day  Time spent: Greater than 50% of the 35 minute visit was spent in counseling/coordination of care for the patient as laid out in the A&P.   Dwyane Dee, MD [facility] 06/26/2020, 11:00 AM

## 2020-06-26 NOTE — Assessment & Plan Note (Signed)
-  Multifactorial in setting of underlying dementia and amyloid angiopathy.  Possible superimposed delirium or even infection -Follow-up UA, still awaiting collection -Continue Seroquel -Obtain EKG for baseline QTC -If still agitated, will try to use low-dose Haldol

## 2020-06-26 NOTE — Assessment & Plan Note (Signed)
Patient apparently noted to have epileptiform complexes on EEG at last admission No clear seizure was observed the patient was possibly noted in postictal state Patient's behavior apparently improved with addition of divalproex Continue divalproex

## 2020-06-26 NOTE — Assessment & Plan Note (Signed)
-   New intraparenchymal hemorrhage or hemorrhagic infarction at the right parietooccipital junction measuring 2.5 cm in diameter. Resolving changes of a late subacute intraparenchymal hematoma at the left temporoparietal junction. No significant mass effect or midline shift. Known diagnosis of amyloid angiopathy. -No surgical intervention indicated per neurosurgery -Repeat CT head has been ordered for 06/26/2020 but patient uncooperative for now

## 2020-06-26 NOTE — Progress Notes (Signed)
STROKE TEAM PROGRESS NOTE   INTERVAL HISTORY Her son is at the bedside.  Per son, pt had ICH on the left in 05/2020 and went to rehab at [facility]. Since going home, she continued to have behavior disturbance. Admitted one week ago, concerning for abnormal EEG and was put on depakote. This time, pt continues to have behavior issue and wondering around outside home at night. CT found right occipital new small ICH. This morning still combative and not able to have repeat CT or EEG.  OBJECTIVE Vitals:   06/25/20 1517 06/25/20 1948 06/25/20 2335 06/26/20 0459  BP: 120/63 (!) 139/54 110/68 (!) 110/53  Pulse: 77 72 73 67  Resp: 16 18 17 18   Temp: 98.7 F (37.1 C) 97.9 F (36.6 C) 97.6 F (36.4 C) 97.8 F (36.6 C)  TempSrc: Axillary Oral Oral Oral  SpO2: 100% 100% 99% 100%    CBC:  Recent Labs  Lab 06/25/20 0617 06/26/20 0455  WBC 7.6 7.9  NEUTROABS 5.7  --   HGB 11.0* 11.1*  HCT 36.1 34.2*  MCV 95.0 91.7  PLT 148* PLATELET CLUMPS NOTED ON SMEAR, UNABLE TO ESTIMATE    Basic Metabolic Panel:  Recent Labs  Lab 06/25/20 0617 06/26/20 0455  NA 141 144  K 3.2* 4.1  CL 104 107  CO2 24 24  GLUCOSE 115* 83  BUN 20 18  CREATININE 0.93 0.91  CALCIUM 9.3 8.7*    Lipid Panel:     Component Value Date/Time   CHOL 218 (H) 11/15/2019 0735   TRIG 125.0 11/15/2019 0735   HDL 55.90 11/15/2019 0735   CHOLHDL 4 11/15/2019 0735   VLDL 25.0 11/15/2019 0735   LDLCALC 137 (H) 11/15/2019 0735   HgbA1c:  Lab Results  Component Value Date   HGBA1C 5.9 11/15/2019   Urine Drug Screen: No results found for: LABOPIA, COCAINSCRNUR, LABBENZ, AMPHETMU, THCU, LABBARB  Alcohol Level No results found for: ETH  IMAGING  CT Head Wo Contrast 06/25/2020 IMPRESSION:  1. New intraparenchymal hemorrhage or hemorrhagic infarction at the right parietooccipital junction measuring 2.5 cm in diameter. Resolving changes of a late subacute intraparenchymal hematoma at the left temporoparietal junction. No  significant mass effect or midline shift. Known diagnosis of amyloid angiopathy.  2. Chronic small-vessel ischemic changes elsewhere throughout the brain.  CT Head Wo Contrast - pending  Transthoracic Echocardiogram  00/00/2021 Pending  ECG - pending  EEG - pending  PHYSICAL EXAM  Temp:  [97.6 F (36.4 C)-99.2 F (37.3 C)] 98.7 F (37.1 C) (11/15 1145) Pulse Rate:  [67-77] 67 (11/15 1145) Resp:  [15-18] 18 (11/15 1145) BP: (110-139)/(49-73) 114/49 (11/15 1145) SpO2:  [97 %-100 %] 100 % (11/15 1145)  General - Well nourished, well developed, pleasantly confused.  Ophthalmologic - fundi not visualized due to noncooperation.  Cardiovascular - Regular rhythm and rate.  Neuro - initially sleeping but arousable, able to open eyes on request. Moderate dysarthria, not orientated to place, age or time, but no aphasia able to speak in short sentence but paucity of speech, able to name 2/3 and repeat sentence well. Able to follow simple commands. Eyes mid position, but able to gaze bilaterally, not complete though. Not blinking to visual threat bilaterally. Facial symmetric, BUE at least 3/5 and BLE at least 3-/5, symmetric bilaterally. Sensation subjectively symmetrical, coordination grossly intact bilaterally and gait not tested.   ASSESSMENT/PLAN [name] [name] is a 79 y.o. female with a history of cerebral amyloid angiopathy, possible epilepsy (Depakote), HTN, HLD,  hypothyroidism, GERD, anxiety/depression, a recent admission at [facility] for a left occipital ICH, and recently discharged from [facility] 06/17/20 following admission for encephalopathy who presented to [facility] ED with worsening confusion, hallucination and delusion. Her son reported that she has been non compliant with medications. A CT revealed a new ICH. NSGY was consulted but did not feel surgery was indicated. She did not receive IV t-PA due to [place].  Encephalopathy with behavior disturbance  Likely  related to b/l ICH  Fall precaution  seroquel 12.5 Qhs -> bid  Continue depakote  ICH - New right parietooccipital ICH likely due to CAA  CT head -  New intraparenchymal hemorrhage or hemorrhagic infarction at the right parietooccipital junction measuring 2.5 cm in diameter. Resolving changes of a late subacute intraparenchymal hematoma at the left temporoparietal junction. No significant mass effect or midline shift. Known diagnosis of amyloid angiopathy.   CT Head - repeat 11/15 - pending  2D Echo - pending  Sars Corona Virus 2 - negative  LDL - pending  HgbA1c pending  UDS - not ordered  VTE prophylaxis - SCDs  No antithrombotic prior to admission, now on No antithrombotic  Ongoing aggressive stroke risk factor management  Therapy recommendations:  pending  Disposition:  Pending  Hx of ICH  05/2020 admitted to [facility] for left superior temporal ICH with IVH.  CTA no evidence of vascular malformation or significant stenosis/occlusion.  She was stabilized and then sent to rehab.  02/2020 CTA head and neck negative.  CAA  12/2019 MRI concerning for CAA pattern  Follow-up with Dr. Tomi Likens at [facility]  Avoid antiplatelet or anticoagulation  05/2020 left superior temporal ICH with IVH  06/2020 right occipital small ICH  Abnormal EEG  06/16/2020 EEG potential epileptogenicity arising from left more than right frontal region, no seizure.   Put on Depakote 500 mg every 12 hours  Current Depakote level 66 -> pending  EEG pending  Continue Depakote 500 Q12h  Hypertension  Home BP meds: amlodipine  Current BP meds: amlodipine  Stable   SBP goal < 140 mm Hg due to CAA . Long-term BP goal normotensive  Other Stroke Risk Factors  Advanced age  Former cigarette smoker - quit  Obesity, There is no height or weight on file to calculate BMI., recommend weight loss, diet and exercise as appropriate   Hx amyloid angiopathy and previous ICH  Obstructive  sleep apnea, not on CPAP at home  Other Active Problems  Code status - Full code  UTI last admission, UA pending   Hospital day # 1  I had long discussion with son at bedside, updated pt current condition, treatment plan and potential prognosis, and answered all the questions. He expressed understanding and appreciation. I spent  35 minutes in total face-to-face time with the patient, more than 50% of which was spent in counseling and coordination of care, reviewing test results, images and medication, and discussing the diagnosis, treatment plan and potential prognosis. This patient's care requiresreview of multiple databases, neurological assessment, discussion with family, other specialists and medical decision making of high complexity.  Rosalin Hawking, MD PhD Stroke Neurology 06/26/2020 6:31 PM    To contact Stroke Continuity provider, please refer to [email or web address] After hours, contact General Neurology

## 2020-06-26 NOTE — Progress Notes (Signed)
Pt confused. Doesn't understand where she is. Alert tio self only. Trying to get out of bed. Refusing nursing assistance. Gait is unsteady.Pt takes fist and starts swinging at nursing staff using  inappropriate remarks. Son was notified and on his way. Son agree with restraints if necessary. Dr.Girguis was notified. New orders given. Pt more calmer, still confused. Unable to reorient. Continues to yell out and use inappropriate language at intervals. Pt also removed her IV access and telemetry

## 2020-06-26 NOTE — Hospital Course (Signed)
[name] [name] is an 79 y.o. female with PMH significant for amyloid angiopathy who presented with left occipital IPH on 05/26/2020 at [facility], HTN and dyslipidemia who was recently discharged from [facility] on 06/17/2020 after treatment for worsened metabolic encephalopathy who now presented with worsened metabolic encephalopathy and 2.5 cm intraparenchymal hemorrhage at the right parieto-occipital junction.  Patient was unable to provide any meaningful history on admission.  She is cared for by her son at home.  She is noted to be typically confused on a normal day and "lives in the past" however was noted to become more agitated than usual, therefore was brought to the ER. In the ER she underwent CT head which showed a 2.5 cm new intracranial hemorrhage. Patient was discussed with neurosurgery who recommended admission by hospital service for observation and consultation with neurology.  He notes specifically there is no neurosurgical need for intervention at present.  An EEG was attempted on 06/26/2020 however patient was irritated/agitated and did not cooperate with the procedure, therefore it was postponed.

## 2020-06-26 NOTE — Assessment & Plan Note (Signed)
-   Continue amlodipine ?

## 2020-06-26 NOTE — Progress Notes (Signed)
EEG attempted; pt very agitated, started swinging at tech and then grabbed tech's hand and squeezed. Yelled at tech to get out of her house.Will attempt tomorrow as schedule permits.

## 2020-06-27 ENCOUNTER — Ambulatory Visit: Payer: Medicare Other | Admitting: Physical Therapy

## 2020-06-27 ENCOUNTER — Inpatient Hospital Stay (HOSPITAL_COMMUNITY): Payer: Medicare Other

## 2020-06-27 DIAGNOSIS — G40909 Epilepsy, unspecified, not intractable, without status epilepticus: Secondary | ICD-10-CM | POA: Diagnosis not present

## 2020-06-27 DIAGNOSIS — I6389 Other cerebral infarction: Secondary | ICD-10-CM | POA: Diagnosis not present

## 2020-06-27 DIAGNOSIS — G9341 Metabolic encephalopathy: Secondary | ICD-10-CM | POA: Diagnosis not present

## 2020-06-27 DIAGNOSIS — I10 Essential (primary) hypertension: Secondary | ICD-10-CM | POA: Diagnosis not present

## 2020-06-27 DIAGNOSIS — I680 Cerebral amyloid angiopathy: Secondary | ICD-10-CM | POA: Diagnosis not present

## 2020-06-27 DIAGNOSIS — I619 Nontraumatic intracerebral hemorrhage, unspecified: Secondary | ICD-10-CM | POA: Diagnosis not present

## 2020-06-27 LAB — BASIC METABOLIC PANEL
Anion gap: 16 — ABNORMAL HIGH (ref 5–15)
BUN: 20 mg/dL (ref 8–23)
CO2: 24 mmol/L (ref 22–32)
Calcium: 9.5 mg/dL (ref 8.9–10.3)
Chloride: 108 mmol/L (ref 98–111)
Creatinine, Ser: 1.01 mg/dL — ABNORMAL HIGH (ref 0.44–1.00)
GFR, Estimated: 57 mL/min — ABNORMAL LOW (ref >60)
Glucose, Bld: 77 mg/dL (ref 70–99)
Potassium: 3.4 mmol/L — ABNORMAL LOW (ref 3.5–5.1)
Sodium: 148 mmol/L — ABNORMAL HIGH (ref 135–145)

## 2020-06-27 LAB — CBC WITH DIFFERENTIAL/PLATELET
Abs Immature Granulocytes: 0.02 10*3/uL (ref 0.00–0.07)
Basophils Absolute: 0 10*3/uL (ref 0.0–0.1)
Basophils Relative: 0 %
Eosinophils Absolute: 0 10*3/uL (ref 0.0–0.5)
Eosinophils Relative: 1 %
HCT: 32.5 % — ABNORMAL LOW (ref 36.0–46.0)
Hemoglobin: 10.1 g/dL — ABNORMAL LOW (ref 12.0–15.0)
Immature Granulocytes: 0 %
Lymphocytes Relative: 17 %
Lymphs Abs: 1.3 10*3/uL (ref 0.7–4.0)
MCH: 29.4 pg (ref 26.0–34.0)
MCHC: 31.1 g/dL (ref 30.0–36.0)
MCV: 94.5 fL (ref 80.0–100.0)
Monocytes Absolute: 0.7 10*3/uL (ref 0.1–1.0)
Monocytes Relative: 10 %
Neutro Abs: 5.5 10*3/uL (ref 1.7–7.7)
Neutrophils Relative %: 72 %
Platelets: 192 10*3/uL (ref 150–400)
RBC: 3.44 MIL/uL — ABNORMAL LOW (ref 3.87–5.11)
RDW: 12.6 % (ref 11.5–15.5)
WBC: 7.6 10*3/uL (ref 4.0–10.5)
nRBC: 0 % (ref 0.0–0.2)

## 2020-06-27 LAB — TSH: TSH: 0.024 u[IU]/mL — ABNORMAL LOW (ref 0.350–4.500)

## 2020-06-27 LAB — URINALYSIS, ROUTINE W REFLEX MICROSCOPIC
Bacteria, UA: NONE SEEN
Bilirubin Urine: NEGATIVE
Glucose, UA: NEGATIVE mg/dL
Hgb urine dipstick: NEGATIVE
Ketones, ur: 20 mg/dL — AB
Leukocytes,Ua: NEGATIVE
Nitrite: NEGATIVE
Protein, ur: 30 mg/dL — AB
Specific Gravity, Urine: 1.023 (ref 1.005–1.030)
pH: 5 (ref 5.0–8.0)

## 2020-06-27 LAB — LIPID PANEL
Cholesterol: 156 mg/dL (ref 0–200)
HDL: 44 mg/dL (ref >40)
LDL Cholesterol: 92 mg/dL (ref 0–99)
Total CHOL/HDL Ratio: 3.5 RATIO
Triglycerides: 100 mg/dL (ref <150)
VLDL: 20 mg/dL (ref 0–40)

## 2020-06-27 LAB — VALPROIC ACID LEVEL: Valproic Acid Lvl: 56 ug/mL (ref 50.0–100.0)

## 2020-06-27 LAB — HEMOGLOBIN A1C
Hgb A1c MFr Bld: 5.6 % (ref 4.8–5.6)
Mean Plasma Glucose: 114.02 mg/dL

## 2020-06-27 LAB — ECHOCARDIOGRAM COMPLETE
Area-P 1/2: 1.97 cm2
S' Lateral: 2 cm

## 2020-06-27 LAB — MAGNESIUM: Magnesium: 1.9 mg/dL (ref 1.7–2.4)

## 2020-06-27 MED ORDER — LORAZEPAM 2 MG/ML IJ SOLN
0.5000 mg | INTRAMUSCULAR | Status: AC
  Administered 2020-06-27: 0.5 mg via INTRAVENOUS
  Filled 2020-06-27: qty 1

## 2020-06-27 MED ORDER — DIVALPROEX SODIUM 250 MG PO DR TAB
500.0000 mg | DELAYED_RELEASE_TABLET | Freq: Three times a day (TID) | ORAL | Status: DC
  Filled 2020-06-27: qty 2

## 2020-06-27 MED ORDER — LABETALOL HCL 5 MG/ML IV SOLN
10.0000 mg | INTRAVENOUS | Status: DC | PRN
  Filled 2020-06-27: qty 4

## 2020-06-27 MED ORDER — AMLODIPINE BESYLATE 10 MG PO TABS
10.0000 mg | ORAL_TABLET | Freq: Every day | ORAL | Status: DC
  Administered 2020-06-30 – 2020-07-03 (×4): 10 mg via ORAL
  Filled 2020-06-27 (×5): qty 1

## 2020-06-27 MED ORDER — DIVALPROEX SODIUM 125 MG PO CSDR
500.0000 mg | DELAYED_RELEASE_CAPSULE | Freq: Three times a day (TID) | ORAL | Status: DC
  Administered 2020-06-27 – 2020-06-29 (×3): 500 mg via ORAL
  Filled 2020-06-27 (×3): qty 4

## 2020-06-27 NOTE — Evaluation (Signed)
Occupational Therapy Evaluation Patient Details Name: [name] [name] MRN: [id number] DOB: 11-21-1940 Today's Date: 06/27/2020    History of Present Illness Pt is a 79 y/o female admitted secondary to AMS. Found to have UTI. Pt also had EEG which revealed evidence of potential epileptogenicity arising from left more than right frontal region. Pt with recent admission to [facility] for hemmorhagic CVA. PMH includes CVA, HTN, and R THA.    Clinical Impression   PT admitted with AMS with UTI(+). Pt currently with functional limitiations due to the deficits listed below (see OT problem list). Pt currently very limited by arousal and sustained arousal. Pt with repositioning and cold wash cloth used to help with arousal. Pt becoming more agitated with attempts and allowed to resume sleeping.  Pt will benefit from skilled OT to increase their independence and safety with adls and balance to allow discharge SNF.     Follow Up Recommendations  SNF    Equipment Recommendations  Other (comment) (TBA- currently would need to maximize DME due to arousal)    Recommendations for Other Services       Precautions / Restrictions Precautions Precautions: Fall Restrictions Weight Bearing Restrictions: No      Mobility Bed Mobility Overal bed mobility: Needs Assistance Bed Mobility: Supine to Sit;Sit to Supine     Supine to sit: Total assist Sit to supine: Max assist   General bed mobility comments: pt with chair position used and does not attempt to (A) herself at this time    Transfers                 General transfer comment: not attemtped    Balance Overall balance assessment: Needs assistance Sitting-balance support: No upper extremity supported;Feet supported Sitting balance-Leahy Scale: Poor Sitting balance - Comments: required mod A with initial sitting, progressed to min A with stimulation. No particular lean in one direction, just decreased control.                                     ADL either performed or assessed with clinical judgement   ADL Overall ADL's : Needs assistance/impaired   Eating/Feeding Details (indicate cue type and reason): too lethargic for NPO attempts. OT arousing pt and presenting food to see if that would stimulate pt arousal to participate. pt shows no interest Grooming: Maximal assistance;Wash/dry face Grooming Details (indicate cue type and reason): pt removes was cloth left on forehead      Lower Body Bathing: Total assistance       Lower Body Dressing: Total assistance                 General ADL Comments: pt limited this session by arousal      Vision   Vision Assessment?: Yes Eye Alignment: Impaired (comment) Additional Comments: unknown- pt noted to have dysconjugate gaze but does not sustain visual attention to test further     Perception     Praxis      Pertinent Vitals/Pain Pain Assessment: No/denies pain     Hand Dominance Right   Extremity/Trunk Assessment Upper Extremity Assessment Upper Extremity Assessment: Generalized weakness (moving both extremities)   Lower Extremity Assessment Lower Extremity Assessment: Defer to PT evaluation RLE Deficits / Details: pt moved BLE's on command, noted movement at ankle as well as knee extensors. Full ROM not visualized LLE Deficits / Details: see RLE note   Cervical / Trunk  Assessment Cervical / Trunk Assessment: Normal   Communication Communication Communication: Receptive difficulties;Expressive difficulties   Cognition Arousal/Alertness: Lethargic Behavior During Therapy: Flat affect Overall Cognitive Status: No family/caregiver present to determine baseline cognitive functioning Area of Impairment: Orientation;Attention;Memory;Following commands;Safety/judgement;Awareness;Problem solving                 Orientation Level: Disoriented to;Time;Situation;Place;Person Current Attention Level:  (arousal) Memory:  Decreased short-term memory Following Commands:  (no follow commands) Safety/Judgement: Decreased awareness of safety;Decreased awareness of deficits Awareness: Intellectual Problem Solving: Slow processing;Decreased initiation;Difficulty sequencing;Requires verbal cues;Requires tactile cues General Comments: pt aroused and asking to be left alone. pt asking staff to "not do that" pt arouses with tactile input and wash cloth. pt does not sustain. pt HOB elevated for positioing change and not much improvement in alertness   General Comments  Pt with bil wrist restraints. pt able to move all extremities     Exercises     Shoulder Instructions      Home Living Family/patient expects to be discharged to:: Private residence Living Arrangements: Alone Available Help at Discharge: Family;Available PRN/intermittently Type of Home: House Home Access: Stairs to enter [employer] of Steps: 3 Entrance Stairs-Rails: Right;Left;Can reach both Home Layout: Two level Alternate Level Stairs-Number of Steps: flight Alternate Level Stairs-Rails: Right;Left Bathroom Shower/Tub: Occupational psychologist: Standard     Home Equipment: None   Additional Comments: per PT report      Prior Functioning/Environment Level of Independence: Independent        Comments: drove, shopped, cooked        OT Problem List: Decreased strength;Decreased activity tolerance;Impaired balance (sitting and/or standing);Impaired vision/perception;Decreased coordination;Decreased cognition;Decreased safety awareness;Obesity;Decreased knowledge of use of DME or AE;Decreased knowledge of precautions      OT Treatment/Interventions: Self-care/ADL training;Therapeutic exercise;Neuromuscular education;Energy conservation;DME and/or AE instruction;Manual therapy;Modalities;Therapeutic activities;Cognitive remediation/compensation;Visual/perceptual remediation/compensation;Patient/family  education;Balance training    OT Goals(Current goals can be found in the care plan section) Acute Rehab OT Goals Patient Stated Goal: to sleep OT Goal Formulation: Patient unable to participate in goal setting Time For Goal Achievement: 07/11/20 Potential to Achieve Goals: Poor  OT Frequency: Min 2X/week   Barriers to D/C:            Co-evaluation              AM-PAC OT "6 Clicks" Daily Activity     Outcome Measure Help from another person eating meals?: Total Help from another person taking care of personal grooming?: Total Help from another person toileting, which includes using toliet, bedpan, or urinal?: Total Help from another person bathing (including washing, rinsing, drying)?: Total Help from another person to put on and taking off regular upper body clothing?: Total Help from another person to put on and taking off regular lower body clothing?: Total 6 Click Score: 6   End of Session Nurse Communication: Mobility status;Precautions  Activity Tolerance: Patient limited by lethargy Patient left: in bed;with call bell/phone within reach;with bed alarm set;with restraints reapplied  OT Visit Diagnosis: Unsteadiness on feet (R26.81);Muscle weakness (generalized) (M62.81)                Time: 9798-9211 OT Time Calculation (min): 9 min Charges:  OT General Charges $OT Visit: 1 Visit OT Evaluation $OT Eval Low Complexity: 1 Low   Brynn, OTR/L  Acute Rehabilitation Services Pager: [phone] Office: [phone] .   Jeri Modena 06/27/2020, 3:36 PM

## 2020-06-27 NOTE — Evaluation (Signed)
Physical Therapy Evaluation Patient Details Name: [name] [name] MRN: [id number] DOB: 06-20-1941 Today's Date: 06/27/2020   History of Present Illness  Pt is a 79 y/o female admitted secondary to AMS. Found to have UTI. Pt also had EEG which revealed evidence of potential epileptogenicity arising from left more than right frontal region. Pt with recent admission to [facility] for hemmorhagic CVA. PMH includes CVA, HTN, and R THA.   Clinical Impression  Pt admitted with above diagnosis. Pt lethargic on eval due to meds given for combative episode. Acute PT will follow and expect status may change significantly when she is more alert. We will update recs as needed. Today, she required max A for bed mobility. Was slightly more alert sitting EOB and spoke a few sentences which made it clear that she didn't know where she was. She spoke some things clearly and made other statements that were aphasic. At this time recommend SNF level care at d/c.  Pt currently with functional limitations due to the deficits listed below (see PT Problem List). Pt will benefit from skilled PT to increase their independence and safety with mobility to allow discharge to the venue listed below.       Follow Up Recommendations SNF;Supervision/Assistance - 24 hour    Equipment Recommendations  Other (comment) (TBD)    Recommendations for Other Services       Precautions / Restrictions Precautions Precautions: Fall Restrictions Weight Bearing Restrictions: No      Mobility  Bed Mobility Overal bed mobility: Needs Assistance Bed Mobility: Supine to Sit;Sit to Supine     Supine to sit: Max assist Sit to supine: Max assist   General bed mobility comments: pt lethargic and participating minimally with sup to sit. Pt slightly more alert in sitting and verbalized a couple of sentences when sitting up. Returned to supine with max A but pt did assist with LE movement     Transfers                 General  transfer comment: unsafe to attempt due to pt's level of arousal  Ambulation/Gait             General Gait Details: unable at this time  Stairs            Wheelchair Mobility    Modified Rankin (Stroke Patients Only) Modified Rankin (Stroke Patients Only) Pre-Morbid Rankin Score: Slight disability Modified Rankin: Severe disability     Balance Overall balance assessment: Needs assistance Sitting-balance support: No upper extremity supported;Feet supported Sitting balance-Leahy Scale: Poor Sitting balance - Comments: required mod A with initial sitting, progressed to min A with stimulation. No particular lean in one direction, just decreased control.                                      Pertinent Vitals/Pain Pain Assessment: No/denies pain    Home Living Family/patient expects to be discharged to:: Private residence Living Arrangements: Alone Available Help at Discharge: Family;Available PRN/intermittently Type of Home: House Home Access: Stairs to enter Entrance Stairs-Rails: Right;Left;Can reach both Entrance Stairs-Number of Steps: 3 Home Layout: Two level Home Equipment: None Additional Comments: per PT report    Prior Function Level of Independence: Independent         Comments: drove, shopped, cooked     Hand Dominance   Dominant Hand: Right    Extremity/Trunk Assessment   Upper  Extremity Assessment Upper Extremity Assessment: Generalized weakness (moving both extremities)    Lower Extremity Assessment Lower Extremity Assessment: Defer to PT evaluation RLE Deficits / Details: pt moved BLE's on command, noted movement at ankle as well as knee extensors. Full ROM not visualized LLE Deficits / Details: see RLE note    Cervical / Trunk Assessment Cervical / Trunk Assessment: Normal  Communication   Communication: Receptive difficulties;Expressive difficulties  Cognition Arousal/Alertness: Lethargic Behavior During  Therapy: Flat affect Overall Cognitive Status: No family/caregiver present to determine baseline cognitive functioning Area of Impairment: Orientation;Attention;Memory;Following commands;Safety/judgement;Awareness;Problem solving                 Orientation Level: Disoriented to;Time;Situation;Place;Person Current Attention Level:  (arousal) Memory: Decreased short-term memory Following Commands:  (no follow commands) Safety/Judgement: Decreased awareness of safety;Decreased awareness of deficits Awareness: Intellectual Problem Solving: Slow processing;Decreased initiation;Difficulty sequencing;Requires verbal cues;Requires tactile cues General Comments: pt aroused and asking to be left alone. pt asking staff to "not do that" pt arouses with tactile input and wash cloth. pt does not sustain. pt HOB elevated for positioing change and not much improvement in alertness      General Comments General comments (skin integrity, edema, etc.): sister present during session but reports all decisions will be made by pt's son. Neuro PT present on PT arrival and brought of the subject of palliative care consult    Exercises     Assessment/Plan    PT Assessment Patient needs continued PT services  PT Problem List Decreased strength;Decreased range of motion;Decreased activity tolerance;Decreased balance;Decreased mobility;Decreased coordination;Decreased cognition;Decreased knowledge of use of DME;Decreased safety awareness;Decreased knowledge of precautions       PT Treatment Interventions Gait training;DME instruction;Functional mobility training;Therapeutic activities;Therapeutic exercise;Balance training;Neuromuscular re-education;Cognitive remediation;Patient/family education    PT Goals (Current goals can be found in the Care Plan section)  Acute Rehab PT Goals Patient Stated Goal: sister mentions that son is looking at SNFs PT Goal Formulation: With family Time For Goal Achievement:  07/11/20 Potential to Achieve Goals: Fair    Frequency Min 3X/week   Barriers to discharge Decreased caregiver support lived alone    Co-evaluation               AM-PAC PT "6 Clicks" Mobility  Outcome Measure Help needed turning from your back to your side while in a flat bed without using bedrails?: Total Help needed moving from lying on your back to sitting on the side of a flat bed without using bedrails?: Total Help needed moving to and from a bed to a chair (including a wheelchair)?: Total Help needed standing up from a chair using your arms (e.g., wheelchair or bedside chair)?: Total Help needed to walk in hospital room?: Total Help needed climbing 3-5 steps with a railing? : Total 6 Click Score: 6    End of Session   Activity Tolerance: Patient limited by lethargy Patient left: in bed;with bed alarm set;with family/visitor present Nurse Communication: Mobility status PT Visit Diagnosis: Muscle weakness (generalized) (M62.81);Difficulty in walking, not elsewhere classified (R26.2)    Time: 8182-9937 PT Time Calculation (min) (ACUTE ONLY): 37 min   Charges:   PT Evaluation $PT Eval Moderate Complexity: 1 Mod PT Treatments $Therapeutic Activity: 8-22 mins        Leighton Roach, [place]  Pager [phone] Office [place] 06/27/2020, 3:23 PM

## 2020-06-27 NOTE — Progress Notes (Signed)
Pt sent down via bed to CT can.

## 2020-06-27 NOTE — Progress Notes (Signed)
SLP Cancellation Note  Patient Details Name: [name] MRN: [id number] DOB: 11-19-1940   Cancelled treatment:       Reason Eval/Treat Not Completed: Fatigue/lethargy limiting ability to participate. Pt not alert enough even while sitting EOB with PT. Will f/u as able for SLP evaluations.    Osie Bond., M.A. [place] Acute Rehabilitation Services Pager [phone] Office [phone]  06/27/2020, 11:25 AM

## 2020-06-27 NOTE — Progress Notes (Signed)
EEG Completed; Results Pending  

## 2020-06-27 NOTE — Progress Notes (Signed)
Echocardiogram 2D Echocardiogram has been performed.  Oneal Deputy Elliana Bal 06/27/2020, 9:46 AM

## 2020-06-27 NOTE — Progress Notes (Addendum)
STROKE TEAM PROGRESS NOTE   INTERVAL HISTORY No acute events overnight, patient resting in bed upon entering the room and is alert and interactive with the examiner.   OBJECTIVE Vitals:   06/27/20 0335 06/27/20 0804 06/27/20 1159 06/27/20 1526  BP: 116/79 (!) 148/75 (!) 154/61 (!) 132/57  Pulse: 84 (!) 108 75 75  Resp: 18 18 18 18   Temp: 98 F (36.7 C) 100 F (37.8 C) 98.3 F (36.8 C) 99.1 F (37.3 C)  TempSrc:  Axillary Axillary Axillary  SpO2: 93% 99% 99% 97%    CBC:  Recent Labs  Lab 06/25/20 0617 06/25/20 0617 06/26/20 0455 06/27/20 0440  WBC 7.6   < > 7.9 7.6  NEUTROABS 5.7  --   --  5.5  HGB 11.0*   < > 11.1* 10.1*  HCT 36.1   < > 34.2* 32.5*  MCV 95.0   < > 91.7 94.5  PLT 148*   < > PLATELET CLUMPS NOTED ON SMEAR, UNABLE TO ESTIMATE 192   < > = values in this interval not displayed.    Basic Metabolic Panel:  Recent Labs  Lab 06/26/20 0455 06/27/20 0440  NA 144 148*  K 4.1 3.4*  CL 107 108  CO2 24 24  GLUCOSE 83 77  BUN 18 20  CREATININE 0.91 1.01*  CALCIUM 8.7* 9.5  MG  --  1.9    Lipid Panel:     Component Value Date/Time   CHOL 156 06/27/2020 0440   TRIG 100 06/27/2020 0440   HDL 44 06/27/2020 0440   CHOLHDL 3.5 06/27/2020 0440   VLDL 20 06/27/2020 0440   LDLCALC 92 06/27/2020 0440   HgbA1c:  Lab Results  Component Value Date   HGBA1C 5.6 06/27/2020   Urine Drug Screen: No results found for: LABOPIA, COCAINSCRNUR, LABBENZ, AMPHETMU, THCU, LABBARB  Alcohol Level No results found for: ETH  IMAGING  CT Head Wo Contrast 06/25/2020 1. New intraparenchymal hemorrhage or hemorrhagic infarction at the right parietooccipital junction measuring 2.5 cm in diameter. Resolving changes of a late subacute intraparenchymal hematoma at the left temporoparietal junction. No significant mass effect or midline shift. Known diagnosis of amyloid angiopathy.  2. Chronic small-vessel ischemic changes elsewhere throughout the brain.  CT Head WO Contrast   Pending, ordered to evaluate bleed stability attempting to do today 11/16 with patient less agitated  Transthoracic Echocardiogram 06/27/20 1. Limited study due to patient agitation.  2. Left ventricular ejection fraction, by estimation, is 65 to 70%. The  left ventricle has normal function. Left ventricular endocardial border  not optimally defined to evaluate regional wall motion. There is moderate  left ventricular hypertrophy. Left  ventricular diastolic parameters are consistent with Grade I diastolic  dysfunction (impaired relaxation).  3. Right ventricular systolic function is normal. The right ventricular  size is normal. There is normal pulmonary artery systolic pressure. The  estimated right ventricular systolic pressure is 16.0 mmHg.  4. The mitral valve is grossly normal. Trivial mitral valve  regurgitation.  5. The aortic valve is grossly normal. Aortic valve regurgitation is not  visualized. No aortic stenosis is present.  6. The inferior vena cava is normal in size with greater than 50%  respiratory variability, suggesting right atrial pressure of 3 mmHg.   EEG 06/27/20 This study showed evidence of potential epileptogenicity arising fromleft and right frontal region as well as moderate diffuse encephalopathy, non specific to to etiology.No seizures were seen throughout the recording.  PHYSICAL EXAM  Temp:  [id number]  F (36.7 C)-100 F (37.8 C)] 99.1 F (37.3 C) (11/16 1526) Pulse Rate:  [75-108] 75 (11/16 1526) Resp:  [16-18] 18 (11/16 1526) BP: (116-158)/(57-80) 132/57 (11/16 1526) SpO2:  [84 %-99 %] 97 % (11/16 1526)  General - Well nourished, well developed, pleasantly confused.  Ophthalmologic - fundi not visualized due to noncooperation.  Cardiovascular - Regular rhythm and rate.  Neuro - initially sleeping but arousable, able to open eyes on request. Moderate dysarthria, not orientated to place, age or time, but no aphasia able to speak in short  sentence but paucity of speech, able to name 3/3 and repeat sentence well. Able to follow simple commands. Eyes mid position, but able to gaze bilaterally, not complete though. VFF with BTT. Face symmetric, BUE at least 3/5 and BLE at least 3-/5, symmetric bilaterally. Sensation subjectively symmetrical, coordination grossly intact bilaterally and gait not tested.   ASSESSMENT/PLAN [name] is a 79 y.o. female with a history of cerebral amyloid angiopathy, possible epilepsy (Depakote), HTN, HLD, hypothyroidism, GERD, anxiety/depression, a recent admission at [facility] for a left occipital ICH, and recently discharged from [facility] 06/17/20 following admission for encephalopathy who presented to [facility] ED with worsening confusion, hallucination and delusion. Her son reported that she has been non compliant with medications. A CT revealed a new ICH. NSGY was consulted but did not feel surgery was indicated. She did not receive IV t-PA due to [place].  Encephalopathy with behavior disturbance  Likely related to b/l ICH  Fall precaution  seroquel 12.5 Qhs -> bid  Increase depakote to 500 Q8h  ICH - New right parietooccipital ICH likely due to CAA  CT head -  New intraparenchymal hemorrhage or hemorrhagic infarction at the right parietooccipital junction measuring 2.5 cm in diameter. Resolving changes of a late subacute intraparenchymal hematoma at the left temporoparietal junction. No significant mass effect or midline shift. Known diagnosis of amyloid angiopathy.   CT Head - repeat 11/15 - still pending   2D Echo - LVEF 65 to 70 %, right ventricular systolic function normal, inferior vena cava normal in size ( view full report above)   [employer] Virus 2 - negative  LDL - 137   HgbA1c 5.6  UDS - not ordered  VTE prophylaxis - SCDs  No antithrombotic prior to admission, now on No antithrombotic  Ongoing aggressive stroke risk factor management  Therapy  recommendations: SNF  Hx of ICH  05/2020 admitted to [facility] for left superior temporal [place] with IVH.  CTA no evidence of vascular malformation or significant stenosis/occlusion.  She was stabilized and then sent to rehab.  02/2020 CTA head and neck negative.  CAA  12/2019 MRI concerning for CAA pattern  Follow-up with Dr. Tomi Likens at [facility]  Avoid antiplatelet or anticoagulation  05/2020 left superior temporal ICH with IVH  06/2020 right occipital small ICH  Abnormal EEG  06/16/2020 EEG potential epileptogenicity arising from left more than right frontal region, no seizure.   Put on Depakote 500 mg every 12 hours  Current Depakote level is 66->56   06/27/20 EEG w/ evidence of potential epileptogenicity arising fromleft and right frontal region as well as moderate diffuse encephalopathy, non specific to to etiology.No seizures were seen   Will increase Depakote 500 Q12h to Q8h  Hypertension  Home BP meds: amlodipine  Current BP meds: amlodipine  Stable   SBP goal < 140 mm Hg due to CAA . Long-term BP goal normotensive  Other Stroke Risk Factors  Advanced age  Former cigarette smoker - quit  Obesity, There is no height or weight on file to calculate BMI., recommend weight loss, diet and exercise as appropriate   Hx amyloid angiopathy and previous ICH  Obstructive sleep apnea, not on CPAP at home  Other Active Problems  Code status - Full code  UTI last admission, UA neg this time  Goals of Care Patients niece at bedside today on 06/27/20. Had a discussion with her regarding palliative care. Made it clear that the patients prognosis is poor and that she will continue to have bleeds due to her amyloid. Given this I expressed to the patients niece that it would be beneficial to have a discussion with palliative care to be able to facilitate a conversation about code status and what the patient would have wanted if she were in the appropriate cognitive state.  Niece was amenable to this, still have to speak to the patients nephew who is her POA.   Hospital day # 2   Patient seen and discussed with attending physician Dr. Rayetta Pigg, NP  Stroke Service Nurse Practitioner  06/27/2020 3:54 PM  ATTENDING NOTE: I reviewed above note and agree with the assessment and plan. Pt was seen and examined.   No family at bedside.  Patient lying in bed, sleepy and lethargic, but arousable.  Neuro unchanged.  On Seroquel twice daily, seems effective.  Still pending CT head and repeat to document stabilization of hematoma.  EEG finally done today showed bifrontal epileptic discharges, no seizure, similar to the EEG last time.  Depakote level 56, will increase Depakote to 500 mg every 8 hours.  UA negative this time.  BP goal less than 140 due to CAA.  Will follow.  Rosalin Hawking, MD PhD Stroke Neurology 06/27/2020 7:54 PM     To contact Stroke Continuity provider, please refer to [email or web address] After hours, contact General Neurology

## 2020-06-27 NOTE — Procedures (Signed)
Patient Name: [name] [name]  MRN: [id number]  Epilepsy Attending: Lora Havens  Referring Physician/Provider: Dr Rosalin Hawking Date: 06/27/2020 Duration: 24.26 mins  Patient history: 79 year old female with history of cerebral amyloid angiopathy, epilepsy who presented with encephalopathy and was noted to have new right parieto-occipital ICH.  EEG to evaluate for seizures.  Level of alertness: Asleep, awake/lethargic  AEDs during EEG study: Depakote  Technical aspects: This EEG study was done with scalp electrodes positioned according to the 10-20 International system of electrode placement. Electrical activity was acquired at a sampling rate of 500Hz  and reviewed with a high frequency filter of 70Hz  and a low frequency filter of 1Hz . EEG data were recorded continuously and digitally stored.   Description: During awake state, no clear posterior dominant rhythm was seen.  Sleep was characterized by vertex waves, sleep spindles (12 to 14 Hz), maximal frontocentral region.  EEG showed continuous generalized 3 to 6 Hz theta- delta slowing. Spikes were noted in bifrontal Left and Right region. Hyperventilation and photic stimulation were not performed.     ABNORMALITY - Spikes, Left and right frontal region - Continuous slow, generalized  IMPRESSION: This study showed evidence of potential epileptogenicity arising from left and right frontal region as well as moderate diffuse encephalopathy, non specific to to etiology. No seizures were seen throughout the recording.  [name] [name]

## 2020-06-27 NOTE — Progress Notes (Signed)
PROGRESS NOTE  [name] [name]  DOB: [birth year]  PCP: Burnis Medin, MD ZOX:[id number]  DOA: 06/25/2020  LOS: 2 days   Chief complaint: Altered mental status  Brief narrative: [name] [name] is a 79 y.o. female who was brought to the ED on 06/25/2020 for altered mental status. PMH significant for amyloid angiopathy, recent hemorrhagic stroke10/15/2021 at [facility] -residual cognitive deficits, poststroke aphasia, hypertension,anxiety/depression,hypothyroidism. Patient was recently hospitalized 11/4-11/6 here at [facility] for encephalopathy.   She is cared for by her son at home.  She was noted to be typically confused on a normal day and "lives in the past" however was noted to become more agitated than usual, therefore was brought to the ER.  In the ER she underwent CT head which showed a 2.5 cm new intracranial hemorrhage.Patient was discussed with neurosurgery who recommended admission by hospital service for observation and consultation with neurology.  Subjective: Patient was seen and examined this morning. At the time of my evaluation, patient was calm, composed, not restless or agitated.  She is able to tell me her name and knew she was in the hospital.  She is clearly confused and is inconsistent with other answers.  Assessment/Plan: Cerebral hemorrhage(nontraumatic) ([place]) -New intraparenchymal hemorrhage or hemorrhagic infarction at the right parietooccipital junction measuring 2.5 cm in diameter. Resolving changes of a late subacute intraparenchymal hematoma at the left temporoparietal junction. No significant mass effect or midline shift. Known diagnosis of amyloid angiopathy. -No surgical intervention indicated per neurosurgery  Acute metabolic encephalopathy -Multifactorial in setting of underlying dementia and amyloid angiopathy.  Possible superimposed delirium or even infection -Ordered for urinalysis again. -Continue Seroquel -QTC 404 ms. -If still  agitated, will try to use low-dose Haldol  Seizure disorder ([place]) -Patient apparently noted to have epileptiform complexes on EEG at last admission -No clear seizure was observed the patient was possibly noted in postictal state -Patient's behavior apparently improved with addition of divalproex -Continue divalproex -Repeat EEG was obtained today.  Hypothyroidism -Continue Synthroid  Essential hypertension -Continue amlodipine  Mobility: Physically strong. Code Status:   Code Status: Full Code  Nutritional status: There is no height or weight on file to calculate BMI.     Diet Order            Diet Heart Room service appropriate? Yes; Fluid consistency: Thin  Diet effective now                 DVT prophylaxis: Place and maintain sequential compression device Start: 06/26/20 1812 Place TED hose Start: 06/25/20 1010   Antimicrobials: none Fluid: none Consultants: neurology Family Communication: not at bedside.  Status is: Inpatient  Remains inpatient appropriate because:Altered mental status   Dispo: The patient is from: Home              Anticipated d/c is to: Home vs SNF              Anticipated d/c date is: 2 days              Patient currently is not medically stable to d/c.       Infusions:    Scheduled Meds: . amLODipine  2.5 mg Oral Daily  . divalproex  500 mg Oral Q12H  . levothyroxine  100 mcg Oral Q0600  . melatonin  6 mg Oral QHS  . potassium chloride  40 mEq Oral Once  . QUEtiapine  12.5 mg Oral BID    Antimicrobials: Anti-infectives (From admission, onward)  None      PRN meds: acetaminophen, polyethylene glycol   Objective: Vitals:   06/27/20 0804 06/27/20 1159  BP: (!) 148/75 (!) (P) 154/61  Pulse: (!) 108 (P) 75  Resp: 18 (P) 18  Temp: 100 F (37.8 C) (P) 98.3 F (36.8 C)  SpO2: 99% (P) 99%    Intake/Output Summary (Last 24 hours) at 06/27/2020 1344 Last data filed at 06/27/2020 0900 Gross per 24 hour  Intake  240 ml  Output --  Net 240 ml   There were no vitals filed for this visit. Weight change:  There is no height or weight on file to calculate BMI.   Physical Exam: General exam: Appears calm and comfortable.  Not in physical distress Skin: No rashes, lesions or ulcers. HEENT: Atraumatic, normocephalic, supple neck, no obvious bleeding Lungs: Clear to auscultation bilaterally CVS: Regular rate and rhythm, no murmur  GI/Abd soft, nontender, nondistended, bowel sound present CNS: Alert, awake, knows he is in the hospital.  Confused otherwise not restless or agitated Psychiatry: Depressed look Extremities: No pedal edema, no calf tenderness  Data Review: I have personally reviewed the laboratory data and studies available.  Recent Labs  Lab 06/25/20 0617 06/26/20 0455 06/27/20 0440  WBC 7.6 7.9 7.6  NEUTROABS 5.7  --  5.5  HGB 11.0* 11.1* 10.1*  HCT 36.1 34.2* 32.5*  MCV 95.0 91.7 94.5  PLT 148* PLATELET CLUMPS NOTED ON SMEAR, UNABLE TO ESTIMATE 192   Recent Labs  Lab 06/25/20 0617 06/26/20 0455 06/27/20 0440  NA 141 144 148*  K 3.2* 4.1 3.4*  CL 104 107 108  CO2 24 24 24   GLUCOSE 115* 83 77  BUN 20 18 20   CREATININE 0.93 0.91 1.01*  CALCIUM 9.3 8.7* 9.5  MG  --   --  1.9    F/u labs ordered.  Signed, Terrilee Croak, MD [facility] 06/27/2020

## 2020-06-28 DIAGNOSIS — Z7189 Other specified counseling: Secondary | ICD-10-CM | POA: Diagnosis not present

## 2020-06-28 DIAGNOSIS — I619 Nontraumatic intracerebral hemorrhage, unspecified: Secondary | ICD-10-CM | POA: Diagnosis not present

## 2020-06-28 DIAGNOSIS — Z515 Encounter for palliative care: Secondary | ICD-10-CM | POA: Diagnosis not present

## 2020-06-28 DIAGNOSIS — G40909 Epilepsy, unspecified, not intractable, without status epilepticus: Secondary | ICD-10-CM | POA: Diagnosis not present

## 2020-06-28 DIAGNOSIS — I10 Essential (primary) hypertension: Secondary | ICD-10-CM | POA: Diagnosis not present

## 2020-06-28 DIAGNOSIS — R4182 Altered mental status, unspecified: Secondary | ICD-10-CM | POA: Diagnosis not present

## 2020-06-28 DIAGNOSIS — I680 Cerebral amyloid angiopathy: Secondary | ICD-10-CM | POA: Diagnosis not present

## 2020-06-28 LAB — CBC WITH DIFFERENTIAL/PLATELET
Abs Immature Granulocytes: 0.04 10*3/uL (ref 0.00–0.07)
Basophils Absolute: 0 10*3/uL (ref 0.0–0.1)
Basophils Relative: 0 %
Eosinophils Absolute: 0.1 10*3/uL (ref 0.0–0.5)
Eosinophils Relative: 1 %
HCT: 39 % (ref 36.0–46.0)
Hemoglobin: 12.2 g/dL (ref 12.0–15.0)
Immature Granulocytes: 1 %
Lymphocytes Relative: 21 %
Lymphs Abs: 1.8 10*3/uL (ref 0.7–4.0)
MCH: 29.5 pg (ref 26.0–34.0)
MCHC: 31.3 g/dL (ref 30.0–36.0)
MCV: 94.2 fL (ref 80.0–100.0)
Monocytes Absolute: 1.2 10*3/uL — ABNORMAL HIGH (ref 0.1–1.0)
Monocytes Relative: 15 %
Neutro Abs: 5.2 10*3/uL (ref 1.7–7.7)
Neutrophils Relative %: 62 %
Platelets: 152 10*3/uL (ref 150–400)
RBC: 4.14 MIL/uL (ref 3.87–5.11)
RDW: 12.7 % (ref 11.5–15.5)
WBC: 8.3 10*3/uL (ref 4.0–10.5)
nRBC: 0 % (ref 0.0–0.2)

## 2020-06-28 LAB — BASIC METABOLIC PANEL WITH GFR
Anion gap: 13 (ref 5–15)
BUN: 16 mg/dL (ref 8–23)
CO2: 23 mmol/L (ref 22–32)
Calcium: 9.3 mg/dL (ref 8.9–10.3)
Chloride: 113 mmol/L — ABNORMAL HIGH (ref 98–111)
Creatinine, Ser: 0.86 mg/dL (ref 0.44–1.00)
GFR, Estimated: >60 mL/min
Glucose, Bld: 119 mg/dL — ABNORMAL HIGH (ref 70–99)
Potassium: 3.5 mmol/L (ref 3.5–5.1)
Sodium: 149 mmol/L — ABNORMAL HIGH (ref 135–145)

## 2020-06-28 LAB — VALPROIC ACID LEVEL: Valproic Acid Lvl: 87 ug/mL (ref 50.0–100.0)

## 2020-06-28 LAB — MAGNESIUM: Magnesium: 2.1 mg/dL (ref 1.7–2.4)

## 2020-06-28 MED ORDER — DEXTROSE 5 % IV SOLN: INTRAVENOUS | Status: AC

## 2020-06-28 NOTE — Progress Notes (Signed)
SLP Cancellation Note  Patient Details Name: CELISA SCHOENBERG MRN: [id number] DOB: [birth year]   Cancelled treatment:       Reason Eval/Treat Not Completed: SLP screened, no needs identified, will sign off. Pt passed [facility] swallow screen with RN and diet initiated. Will defer swallow eval and f/u for cognitive linguistic eval when able.    Mackayla Mullins, Katherene Ponto 06/28/2020, 11:16 AM

## 2020-06-28 NOTE — Consult Note (Signed)
Consultation Note Date: 06/28/2020   Patient Name: [name] [name]  DOB: [birth year]  MRN: [id number]  Age / Sex: 79 y.o., female  PCP: Regis Bill Standley Brooking, MD Referring Physician: Terrilee Croak, MD  Reason for Consultation: Establishing goals of care and Psychosocial/spiritual support  HPI/Patient Profile: 79 y.o. female   admitted on 06/25/2020 with past medical history of cerebral amyloid angiopathy, HTN, HLD, hypothyroidism, GERD, anxiety/depression who presents to [facility] ED for worsening confusion.   She had a recent admission at [facility] for a left occipital ICH.  She has also had  a recent admission to [facility] for metabolic encepahalopthy and discharged on 06/17/2020.  CT head during last admission revealed Improving hemorrhage in the left temporal occipital lobe.  EEG during last admission possible epilepsy and was started on Depakote.  Family reports that patient had increased agitated behavior and confusion over the past week at home. She was found wandering around outside.  Patient was admitted through the ER for further work-up, treatment and stabilization.  Acute metabolic encephalopathy with intermittent confusion.  Currently she is minimally responsive to gentle touch and verbal stimuli and with poor p.o. intake  Family face treatment option decisions, advanced directive decisions and anticipatory care needs     Clinical Assessment and Goals of Care:   This NP Wadie Lessen reviewed medical records, received report from team, assessed the patient and then meet at the patient's bedside and spoke to son/[name] [name]  by telephone to discuss diagnosis, prognosis, GOC, EOL wishes disposition and options.   Concept of Palliative Care was introduced as specialized medical care for people and their families living with serious illness.  If focuses on providing relief from the symptoms and stress  of a serious illness.  The goal is to improve quality of life for both the patient and the family.  Created space and opportunity for son to explore thoughts and feelings regarding current medical situation.  He verbalizes how "unbelievable this all is" she was living fully independent and highly functioning less than 6 weeks ago.     Education offered regarding advanced directives.  Concepts specific to code status, artifical feeding and hydration, continued IV antibiotics and rehospitalization was had.  The difference between a aggressive medical intervention path  and a palliative comfort care path for this patient at this time was had.  Values and goals of care important to patient and family were attempted to be elicited.    Questions and concerns addressed.  Patient  encouraged to call with questions or concerns.     PMT will continue to support holistically.    Plan is to meet tomorrow morning at 0900 for further discussion regarding GOCs     No documented HPOA or AD.  Patient's 2 sons [name] [name] and [name] [name] work in unison to make decisions for [name] [name].     SUMMARY OF RECOMMENDATIONS    Code Status/Advance Care Planning:  Full code   Encouraged family to consider DNR/DNI status understanding evidenced based poor outcomes in similar  hospitalized patient, as the cause of arrest is likely associated with advanced chronic illness rather than an easily reversible acute cardio-pulmonary event.  Palliative Prophylaxis:   Aspiration, Bowel Regimen, Delirium Protocol, Frequent Pain Assessment and Oral Care  Additional Recommendations (Limitations, Scope, Preferences):  Full Scope Treatment  Psycho-social/Spiritual:   Desire for further Chaplaincy support:yes  Additional Recommendations: Emotional support/therapeutic listening  Prognosis:   Unable to determine  Discharge Planning: To Be Determined      Primary Diagnoses: Present on Admission: . Cerebral  hemorrhage(nontraumatic) ([place]) . Essential hypertension . OSA (obstructive sleep apnea)   I have reviewed the medical record, interviewed the patient and family, and examined the patient. The following aspects are pertinent.  Past Medical History:  Diagnosis Date  . Adenomatous colon polyp   . Anxiety   . Cataract 2013   bilat removed   . DJD (degenerative joint disease) of lumbar spine   . Estrogen deficiency   . Gastritis   . GERD (gastroesophageal reflux disease)   . Headache(784.0)   . Helicobacter pylori infection 10/13/2007   Qualifier: Diagnosis of  By: Marland Mcalpine    . History of positive PPD 10/16/2011  . HSV-2 infection   . Hyperglycemia   . Hyperlipidemia    no meds now  . Hypertension   . IBS (irritable bowel syndrome)   . Medication side effect - Propofol (burning vein) and prvastatin (myalgia) 04/08/2013   Pravastatin patient went off and rechallenged and symptoms recurred muscle cramps /foot cramps   . Neuromuscular disorder (HCC)    muscle cramps  . Pulsatile tinnitus    had MRA and MPV nl mild carotid dopplers2010  . Shingles 02/09/2011   Right upper face and eyelid but not involving the eye at this point.   . Sleep apnea    does not use c pap  . Thyroid disease   . Vulvitis    Social History   Socioeconomic History  . Marital status: Single    Spouse name: widowed.   . Number of children: Y  . Years of education: Not on file  . Highest education level: Not on file  Occupational History  . Occupation: retired.    Comment: [employer]----now retired.    Employer: RETIRED  Tobacco Use  . Smoking status: Former Smoker    Packs/day: 0.50    Years: 23.00    Pack years: 11.50    Types: Cigarettes    Quit date: 06/16/1984    Years since quitting: 36.0  . Smokeless tobacco: Never Used  Vaping Use  . Vaping Use: Never used  Substance and Sexual Activity  . Alcohol use: No  . Drug use: No  . Sexual activity: Not Currently    Partners: Male    Birth  control/protection: Surgical  Other Topics Concern  . Not on file  Social History Narrative   Retired    former smoker quit 1985   Widowed fall 2010 from myeloma. Lives alone.       Right handed   Social Determinants of Health   Financial Resource Strain: Low Risk   . Difficulty of Paying Living Expenses: Not hard at all  Food Insecurity: No Food Insecurity  . Worried About Charity fundraiser in the Last Year: Never true  . Ran Out of Food in the Last Year: Never true  Transportation Needs: No Transportation Needs  . Lack of Transportation (Medical): No  . Lack of Transportation (Non-Medical): No  Physical Activity: Insufficiently Active  .  Days of Exercise per Week: 7 days  . Minutes of Exercise per Session: 20 min  Stress: No Stress Concern Present  . Feeling of Stress : Only a little  Social Connections: Moderately Integrated  . Frequency of Communication with Friends and Family: More than three times a week  . Frequency of Social Gatherings with Friends and Family: Once a week  . Attends Religious Services: More than 4 times per year  . Active Member of Clubs or Organizations: Yes  . Attends Archivist Meetings: More than 4 times per year  . Marital Status: Widowed   Family History  Problem Relation Age of Onset  . Alzheimer's disease Mother   . Heart attack Father        3  . Asthma Sister   . Colon cancer Sister   . Heart attack Brother        38  . Colon polyps Sister   . Colon cancer Sister   . Colon cancer Paternal Aunt   . Thyroid disease Sister        multinodular goiter  . Stomach cancer Neg Hx   . Breast cancer Neg Hx   . Rectal cancer Neg Hx    Scheduled Meds: . amLODipine  10 mg Oral Daily  . divalproex  500 mg Oral Q8H  . levothyroxine  100 mcg Oral Q0600  . melatonin  6 mg Oral QHS  . potassium chloride  40 mEq Oral Once  . QUEtiapine  12.5 mg Oral BID   Continuous Infusions: . dextrose 75 mL/hr at 06/28/20 0759   PRN  Meds:.acetaminophen, labetalol, polyethylene glycol Medications Prior to Admission:  Prior to Admission medications   Medication Sig Start Date End Date Taking? Authorizing Provider  acetaminophen (TYLENOL) 500 MG tablet Take 1,000 mg by mouth every 6 (six) hours as needed for mild pain, moderate pain or headache.    Yes [provider]  amLODipine (NORVASC) 2.5 MG tablet Take 1 tablet (2.5 mg total) by mouth daily. 06/17/20 09/15/20 Yes Dahal, Marlowe Aschoff, MD  divalproex (DEPAKOTE) 500 MG DR tablet Take 1 tablet (500 mg total) by mouth every 12 (twelve) hours. 06/17/20 09/15/20 Yes Dahal, Marlowe Aschoff, MD  levothyroxine (SYNTHROID) 100 MCG tablet Take  5 days per week ( dosage change) 06/23/20  Yes Panosh, Standley Brooking, MD  melatonin 3 MG TABS tablet Take 6 mg by mouth at bedtime.  06/08/20  Yes [provider]  QUEtiapine (SEROQUEL) 25 MG tablet Take 12.5 mg by mouth at bedtime. May take an additional 1/2 tablet 2 times daily as needed for agitation   Yes [provider]  thiamine 100 MG tablet Take 100 mg by mouth daily.   Yes [provider]  vitamin B-12 1000 MCG tablet Take 1 tablet (1,000 mcg total) by mouth daily. 06/18/20 09/16/20 Yes Dahal, Marlowe Aschoff, MD  famotidine (PEPCID) 20 MG tablet Take 1 tablet (20 mg total) by mouth 2 (two) times daily. Patient not taking: Reported on 06/25/2020 01/22/19   Panosh, Standley Brooking, MD  fluticasone [facility]) 50 MCG/ACT nasal spray Place 2 sprays into both nostrils as needed for allergies. FOR RHINITIS Patient not taking: Reported on 06/25/2020 12/03/19   Panosh, Standley Brooking, MD   Allergies  Allergen Reactions  . Lisinopril     REACTION: COUGH  . Lyrica [Pregabalin] Other (See Comments)    Cns side efffects  . Pravastatin Other (See Comments)    Cramps.  . Simvastatin     Myalgia and stiffness  .  Codeine Phosphate     REACTION: itching  . Oxycodone Hcl     REACTION: rash  . Strawberry Extract    Review of Systems  Unable to perform ROS:  Mental status change    Physical Exam Constitutional:      Appearance: She is normal weight. She is ill-appearing.  Cardiovascular:     Rate and Rhythm: Normal rate.  Skin:    General: Skin is warm and dry.  Neurological:     Mental Status: She is lethargic.     Vital Signs: BP 135/66 (BP Location: Right Arm)   Pulse 73   Temp 99.6 F (37.6 C) (Oral)   Resp 16   SpO2 100%  Pain Scale: 0-10   Pain Score: 0-No pain   SpO2: SpO2: 100 % O2 Device:SpO2: 100 % O2 Flow Rate: .   IO: Intake/output summary:   Intake/Output Summary (Last 24 hours) at 06/28/2020 1417 Last data filed at 06/28/2020 0600 Gross per 24 hour  Intake 450 ml  Output 700 ml  Net -250 ml    LBM: Last BM Date: 06/27/20 Baseline Weight:   Most recent weight:       Palliative Assessment/Data: 30 % at best   Discussed with Dahal  Time In: 1400 Time Out: 1510 Time Total: 70 minutes Greater than 50%  of this time was spent counseling and coordinating care related to the above assessment and plan.  Signed by: Wadie Lessen, NP   Please contact Palliative Medicine Team phone at [phone] for questions and concerns.  For individual provider: See Shea Evans

## 2020-06-28 NOTE — Care Management Important Message (Signed)
Important Message  Patient Details  Name: [name] [name] MRN: [id number] Date of Birth: 1940-09-10   Medicare Important Message Given:  Yes     Jewelianna Pancoast Montine Circle 06/28/2020, 3:37 PM

## 2020-06-28 NOTE — Plan of Care (Signed)
  Problem: Clinical Measurements: Goal: Cardiovascular complication will be avoided Outcome: Progressing   Problem: Clinical Measurements: Goal: Respiratory complications will improve Outcome: Progressing   Problem: Clinical Measurements: Goal: Diagnostic test results will improve Outcome: Progressing   Problem: Clinical Measurements: Goal: Will remain free from infection Outcome: Progressing   Problem: Clinical Measurements: Goal: Will remain free from infection Outcome: Progressing   Problem: Clinical Measurements: Goal: Respiratory complications will improve Outcome: Progressing   Problem: Clinical Measurements: Goal: Cardiovascular complication will be avoided Outcome: Progressing

## 2020-06-28 NOTE — Progress Notes (Addendum)
STROKE TEAM PROGRESS NOTE   INTERVAL HISTORY No acute events overnight, patient is sleeping in bed upon entering the room. No family at bedside.   OBJECTIVE Vitals:   06/27/20 2327 06/28/20 0336 06/28/20 0726 06/28/20 1227  BP: (!) 113/53 (!) 118/52 (!) 116/53 135/66  Pulse: 77 68 74 73  Resp: 20 20 18 16   Temp: 98.4 F (36.9 C) 98.8 F (37.1 C) 97.6 F (36.4 C) 99.6 F (37.6 C)  TempSrc: Axillary Oral Oral Oral  SpO2: 95% 97% 96% 100%    CBC:  Recent Labs  Lab 06/27/20 0440 06/28/20 0330  WBC 7.6 8.3  NEUTROABS 5.5 5.2  HGB 10.1* 12.2  HCT 32.5* 39.0  MCV 94.5 94.2  PLT 192 761    Basic Metabolic Panel:  Recent Labs  Lab 06/27/20 0440 06/28/20 0330  NA 148* 149*  K 3.4* 3.5  CL 108 113*  CO2 24 23  GLUCOSE 77 119*  BUN 20 16  CREATININE 1.01* 0.86  CALCIUM 9.5 9.3  MG 1.9 2.1    Lipid Panel:     Component Value Date/Time   CHOL 156 06/27/2020 0440   TRIG 100 06/27/2020 0440   HDL 44 06/27/2020 0440   CHOLHDL 3.5 06/27/2020 0440   VLDL 20 06/27/2020 0440   LDLCALC 92 06/27/2020 0440   HgbA1c:  Lab Results  Component Value Date   HGBA1C 5.6 06/27/2020   Urine Drug Screen: No results found for: LABOPIA, COCAINSCRNUR, LABBENZ, AMPHETMU, THCU, LABBARB  Alcohol Level No results found for: ETH  IMAGING  CT Head Wo Contrast 06/25/2020 1. New intraparenchymal hemorrhage or hemorrhagic infarction at the right parietooccipital junction measuring 2.5 cm in diameter. Resolving changes of a late subacute intraparenchymal hematoma at the left temporoparietal junction. No significant mass effect or midline shift. Known diagnosis of amyloid angiopathy.  2. Chronic small-vessel ischemic changes elsewhere throughout the brain.  CT Head WO Contrast 06/27/20 ( ordered to evaluate bleed stability) 1. Continued interval evolution of early subacute right occipital hemorrhage, slightly increased in size from previous now measuring up to 3 cm in greatest transaxial  dimension, previously 2.5 cm. Associated surrounding vasogenic edema has also mildly increased, but there remains no significant regional mass effect. 2. Similar appearance of late subacute left temporoccipital hemorrhage. No evidence for interval bleeding or new mass effect. 3. No other new acute intracranial abnormality.  Transthoracic Echocardiogram 06/27/20 1. Limited study due to patient agitation.  2. Left ventricular ejection fraction, by estimation, is 65 to 70%. The  left ventricle has normal function. Left ventricular endocardial border  not optimally defined to evaluate regional wall motion. There is moderate  left ventricular hypertrophy. Left  ventricular diastolic parameters are consistent with Grade I diastolic  dysfunction (impaired relaxation).  3. Right ventricular systolic function is normal. The right ventricular  size is normal. There is normal pulmonary artery systolic pressure. The  estimated right ventricular systolic pressure is 95.0 mmHg.  4. The mitral valve is grossly normal. Trivial mitral valve  regurgitation.  5. The aortic valve is grossly normal. Aortic valve regurgitation is not  visualized. No aortic stenosis is present.  6. The inferior vena cava is normal in size with greater than 50%  respiratory variability, suggesting right atrial pressure of 3 mmHg.   EEG 06/27/20 This study showed evidence of potential epileptogenicity arising fromleft and right frontal region as well as moderate diffuse encephalopathy, non specific to to etiology.No seizures were seen throughout the recording.  PHYSICAL EXAM  Temp:  [  97.6 F (36.4 C)-99.6 F (37.6 C)] 99.6 F (37.6 C) (11/17 1227) Pulse Rate:  [68-112] 73 (11/17 1227) Resp:  [16-20] 16 (11/17 1227) BP: (113-154)/(52-78) 135/66 (11/17 1227) SpO2:  [95 %-100 %] 100 % (11/17 1227)  General - Well nourished, well developed, pleasantly confused.  Ophthalmologic - fundi not visualized due to  noncooperation.  Cardiovascular - Regular rhythm and rate.  Neuro - initially sleeping but arousable, able to open eyes on request. Moderate dysarthria, not orientated to place, age or time, but no aphasia able to speak in short sentence but paucity of speech, able to name 3/3 and repeat sentence well. Able to follow simple commands. Eyes mid position, but able to gaze bilaterally, not complete though. VFF with BTT. Face symmetric, BUE at least 3/5 and BLE at least 3-/5, symmetric bilaterally. Sensation subjectively symmetrical, coordination grossly intact bilaterally and gait not tested.   ASSESSMENT/PLAN [name] is a 79 y.o. female with a history of cerebral amyloid angiopathy, possible epilepsy (Depakote), HTN, HLD, hypothyroidism, GERD, anxiety/depression, a recent admission at [facility] for a left occipital ICH, and recently discharged from [facility] 06/17/20 following admission for encephalopathy who presented to [facility] ED with worsening confusion, hallucination and delusion. Her son reported that she has been non compliant with medications. A CT revealed a new ICH. NSGY was consulted but did not feel surgery was indicated. She did not receive IV t-PA due to [place].  Encephalopathy with behavior disturbance  Likely related to b/l ICH  Fall precaution  Seroquel 12.5 Qhs -> bid  Maintain Depakote 500 Q8 for both seizure prevention and mood   ICH - New right parietooccipital ICH likely due to CAA  CT head -  New intraparenchymal hemorrhage or hemorrhagic infarction at the right parietooccipital junction measuring 2.5 cm in diameter. Resolving changes of a late subacute intraparenchymal hematoma at the left temporoparietal junction. No significant mass effect or midline shift. Known diagnosis of amyloid angiopathy.   CT Head - repeat 11/17 - with stable bleed   2D Echo - LVEF 65 to 70 %   [employer] Virus 2 - negative  LDL - 137   HgbA1c 5.6  UDS - not  ordered  VTE prophylaxis - SCDs  No antithrombotic prior to admission, now on No antithrombotic  Ongoing aggressive stroke risk factor management  Therapy recommendations: SNF  Hx of ICH  05/2020 admitted to [facility] for left superior temporal [place] with IVH.  CTA no evidence of vascular malformation or significant stenosis/occlusion.  She was stabilized and then sent to rehab.  02/2020 CTA head and neck negative.  CAA  12/2019 MRI concerning for CAA pattern  Follow-up with Dr. Tomi Likens at [facility]  Avoid antiplatelet or anticoagulation  05/2020 left superior temporal ICH with IVH  06/2020 right occipital small ICH  Abnormal EEG  06/16/2020 EEG potential epileptogenicity arising from left more than right frontal region, no seizure. 06/27/20 EEG w/ evidence of potential epileptogenicity arising fromleft and right frontal region as well as moderate diffuse encephalopathy, non specific to to etiology.No seizures were seen.   Given seizure potential she was placed on Depakote 500 mg every 12 hours, on 06/27/20 this was transitioned from Q12 to Q8 given her Depakote level is 56 and the goal level is 75-100. Recommend to re check a level one week from increasing the Depakote frequency on 07/04/20 to make sure her level is at goal.   Depakote level on 66->56->87  Hypertension  Home BP  meds: amlodipine  Current BP meds: amlodipine  Stable   SBP goal < 140 mm Hg due to CAA . Long-term BP goal normotensive  Other Stroke Risk Factors  Advanced age  Former cigarette smoker - quit  Obesity, There is no height or weight on file to calculate BMI., recommend weight loss, diet and exercise as appropriate   Hx amyloid angiopathy and previous ICH  Obstructive sleep apnea, not on CPAP at home  Other Active Problems  Code status - Full code  UTI last admission, UA neg this time  [place]  Recommended to consult palliative care given poor prognosis in the setting of CAA- spoke to  patients niece on 06/27/20 and she is amenable to this   Hospital day # 3   At this time, neurology will sign off- the patients neurological work up is complete. Her bleed is stable and she is on the appropriate AED regimen.    Patient seen and discussed with attending physician Dr. Rayetta Pigg, NP  Stroke Service Nurse Practitioner  06/28/2020 1:28 PM  ATTENDING NOTE: I reviewed above note and agree with the assessment and plan.   No acute event overnight, no seizure activity.  Neuro stable.  Finally had repeat CT head done reported slightly increased size of hematoma but on my review of CT head, no significant change from 2 days ago.  Patient on Depakote 500 every 8 hours, Depakote level 87 this morning, in good range.  Recommend repeat Depakote level in 5 to 7 days.  Given CAA, no antiplatelet or anticoagulation, avoid NSAIDs.  Okay to take Tylenol for pain.  BP goal less than 140.  Neurology will sign off. Please call with questions. Pt will follow up with Dr. Tomi Likens at [facility] on 07/05/20. Thanks for the consult.  Rosalin Hawking, MD PhD Stroke Neurology 06/28/2020 1:28 PM     To contact Stroke Continuity provider, please refer to [email or web address] After hours, contact General Neurology

## 2020-06-28 NOTE — Progress Notes (Signed)
Occupational Therapy Treatment Patient Details Name: [name] [name] MRN: [id number] DOB: 1941/03/10 Today's Date: 06/28/2020    History of present illness Pt is a 79 y/o female admitted secondary to [place]. Found to have UTI. Pt also had EEG which revealed evidence of potential epileptogenicity arising from left more than right frontal region. Pt with recent admission to [facility] for hemmorhagic CVA. PMH includes CVA, HTN, and R THA.    OT comments  Significantly limited treatment secondary to patient's lethargy. Treatment focused on maintaining alertness and participating with functional task. Patient able to awaken to verbal and tactile stimuli and speak. However, patient keeps her eyes closed and falls back asleep. Attempted feeding and grooming task to improve patient's level of alertness and get patient to physically participate. Patient required total assist for bring drink to mouth but would not take drink. Hand over hand required to wash face with patient not assisting and telling therapist "If you don't leave alone - I'm gonna look at you." Patient reported pain in right hand at IV site and RN notified. Cont POC.    Follow Up Recommendations  SNF    Equipment Recommendations  Other (comment)    Recommendations for Other Services      Precautions / Restrictions         Mobility Bed Mobility                  Transfers                      Balance                                           ADL either performed or assessed with clinical judgement   ADL Overall ADL's : Needs assistance/impaired Eating/Feeding: Total assistance;Bed level Eating/Feeding Details (indicate cue type and reason): Patient required therapist to hold cup and place straw in her mouth - however patient would not take a drink. Grooming: Total assistance;Bed level Grooming Details (indicate cue type and reason): Attempted grooming to improve level of arousal. Required  HOH to wash partial face with left hand (right hand painful due to IV) - patient would not assist.                                     Vision       Perception     Praxis      Cognition Arousal/Alertness: Lethargic Behavior During Therapy: Flat affect Overall Cognitive Status: Difficult to assess                                          Exercises     Shoulder Instructions       General Comments      Pertinent Vitals/ Pain       Pain Assessment: Faces Faces Pain Scale: Hurts little more Pain Location: R hand (IV site) Pain Descriptors / Indicators: Grimacing Pain Intervention(s): Monitored during session  Home Living  Prior Functioning/Environment              Frequency  Min 2X/week        Progress Toward Goals  OT Goals(current goals can now be found in the care plan section)  Progress towards OT goals: Not progressing toward goals - comment  Acute Rehab OT Goals OT Goal Formulation: Patient unable to participate in goal setting Time For Goal Achievement: 07/11/20 Potential to Achieve Goals: Poor  Plan Discharge plan remains appropriate    Co-evaluation                 AM-PAC OT "6 Clicks" Daily Activity     Outcome Measure   Help from another person eating meals?: Total Help from another person taking care of personal grooming?: Total Help from another person toileting, which includes using toliet, bedpan, or urinal?: Total Help from another person bathing (including washing, rinsing, drying)?: Total Help from another person to put on and taking off regular upper body clothing?: Total Help from another person to put on and taking off regular lower body clothing?: Total 6 Click Score: 6    End of Session    OT Visit Diagnosis: Unsteadiness on feet (R26.81);Muscle weakness (generalized) (M62.81)   Activity Tolerance Patient limited by  lethargy   Patient Left in bed;with call bell/phone within reach;with bed alarm set;with restraints reapplied   Nurse Communication Mobility status        Time: 1445-1453 OT Time Calculation (min): 8 min  Charges: OT General Charges $OT Visit: 1 Visit OT Treatments $Self Care/Home Management : 8-22 mins  Derl Barrow, OTR/L [place]  Office [phone] Pager: [place] 06/28/2020, 2:58 PM

## 2020-06-28 NOTE — Progress Notes (Signed)
PROGRESS NOTE  [name] [name]  DOB: January 18, 1941  PCP: Burnis Medin, MD KGM:[id number]  DOA: 06/25/2020  LOS: 3 days   Chief complaint: Altered mental status  Brief narrative: [name] [name] is a 79 y.o. female who was brought to the ED on 06/25/2020 for altered mental status. PMH significant for amyloid angiopathy, recent hemorrhagic stroke10/15/[facility]21 at [facility] -residual cognitive deficits, poststroke aphasia, hypertension,anxiety/depression,hypothyroidism. Patient was recently hospitalized 11/4-11/6 here at [facility] for encephalopathy.   She is cared for by her son at home.  She was noted to be typically confused on a normal day and "lives in the past" however was noted to become more agitated than usual, therefore was brought to the ER.  In the ER she underwent CT head which showed a 2.5 cm new intracranial hemorrhage.Patient was discussed with neurosurgery who recommended admission by hospital service for observation and consultation with neurology.  Subjective: Patient was seen and examined this morning. At the time of my evaluation, patient was somnolent.  She was in restraints.  She open eyes on verbal command and able to tell me her name.  Her sister was at bedside.  Assessment/Plan: Nontraumatic intracerebral hemorrhage History of cerebral amyloid angiopathy -05/26/2020, patient had hemorrhagic stroke with residual cognitive deficits and post stroke epilepsy. -11/4, patient was hospitalized for worsening mental status.  CT scan of head showed improving hemorrhage.   -11/14, readmitted for worsening confusion, hallucination, delusion.  CT scan of head showed a new intracranial hemorrhage measuring 2.5 cm in diameter. -11/15, repeat CT head showed stable findings. -No surgical intervention indicated per neurosurgery -Neurology consultation appreciated.  Patient is not on any antithrombotics. -For cerebral amyloid angiopathy, patient follows up with Dr. Charlene Brooke  at [facility] neurology.  Acute metabolic encephalopathy -Multifactorial in setting of underlying dementia, intracranial hemorrhage and amyloid angiopathy. -EEG on 11/5 showed potential etiopathogenicity but no seizure.  Given seizure potential, patient was started on Depakote 500 mg 3 times daily.  Level within therapeutic range for now.  Needs monitoring as an outpatient.  -QTC 404 ms. -Continue Seroquel.  Hypothyroidism -Continue Synthroid  Essential hypertension -Continue amlodipine  Goals of care -Patient currently full code.  I explained to patient and family that patient probably does not have a chance of meaningful neurological recovery.  Palliative care consultation called.  Mobility: Physically strong. Code Status:   Code Status: Full Code  Nutritional status: There is no height or weight on file to calculate BMI.     Diet Order            Diet Heart Room service appropriate? Yes; Fluid consistency: Thin  Diet effective now                 DVT prophylaxis: Place and maintain sequential compression device Start: 06/26/20 1812 Place TED hose Start: 06/25/20 1010   Antimicrobials: none Fluid: none Consultants: neurology Family Communication: not at bedside.  Status is: Inpatient  Remains inpatient appropriate because:Altered mental status   Dispo: The patient is from: Home              Anticipated d/c is to: Home vs SNF              Anticipated d/c date is: 2 days              Patient currently is not medically stable to d/c.       Infusions:  . dextrose 75 mL/hr at 06/28/20 0759    Scheduled Meds: . amLODipine  10 mg Oral Daily  . divalproex  500 mg Oral Q8H  . levothyroxine  100 mcg Oral Q0600  . melatonin  6 mg Oral QHS  . potassium chloride  40 mEq Oral Once  . QUEtiapine  12.5 mg Oral BID    Antimicrobials: Anti-infectives (From admission, onward)   None      PRN meds: acetaminophen, labetalol, polyethylene glycol    Objective: Vitals:   06/28/20 1227 06/28/20 1625  BP: 135/66 (!) 146/62  Pulse: 73 70  Resp: 16 18  Temp: 99.6 F (37.6 C) (!) 100.6 F (38.1 C)  SpO2: 100% 96%    Intake/Output Summary (Last 24 hours) at 06/28/2020 1631 Last data filed at 06/28/2020 0600 Gross per 24 hour  Intake 450 ml  Output 700 ml  Net -250 ml   There were no vitals filed for this visit. Weight change:  There is no height or weight on file to calculate BMI.   Physical Exam: General exam: Appears calm and comfortable.  Not in physical distress Skin: No rashes, lesions or ulcers. HEENT: Atraumatic, normocephalic, supple neck, no obvious bleeding Lungs: Clear to auscultation bilaterally CVS: Regular rate and rhythm, no murmur  GI/Abd soft, nontender, nondistended, bowel sound present CNS: Somnolent, opens eyes on verbal command.  Can tell her name. Psychiatry: Depressed look Extremities: No pedal edema, no calf tenderness  Data Review: I have personally reviewed the laboratory data and studies available.  Recent Labs  Lab 06/25/20 0617 06/26/20 0455 06/27/20 0440 06/28/20 0330  WBC 7.6 7.9 7.6 8.3  NEUTROABS 5.7  --  5.5 5.2  HGB 11.0* 11.1* 10.1* 12.2  HCT 36.1 34.2* 32.5* 39.0  MCV 95.0 91.7 94.5 94.2  PLT 148* PLATELET CLUMPS NOTED ON SMEAR, UNABLE TO ESTIMATE 192 152   Recent Labs  Lab 06/25/20 0617 06/26/20 0455 06/27/20 0440 06/28/20 0330  NA 141 144 148* 149*  K 3.2* 4.1 3.4* 3.5  CL 104 107 108 113*  CO2 24 24 24 23   GLUCOSE 115* 83 77 119*  BUN 20 18 20 16   CREATININE 0.93 0.91 1.01* 0.86  CALCIUM 9.3 8.7* 9.5 9.3  MG  --   --  1.9 2.1    F/u labs ordered.  Signed, Terrilee Croak, MD [facility] 06/28/2020

## 2020-06-29 ENCOUNTER — Other Ambulatory Visit: Payer: Self-pay

## 2020-06-29 DIAGNOSIS — R4182 Altered mental status, unspecified: Secondary | ICD-10-CM | POA: Diagnosis not present

## 2020-06-29 DIAGNOSIS — Z66 Do not resuscitate: Secondary | ICD-10-CM | POA: Diagnosis not present

## 2020-06-29 DIAGNOSIS — R1319 Other dysphagia: Secondary | ICD-10-CM

## 2020-06-29 DIAGNOSIS — Z515 Encounter for palliative care: Secondary | ICD-10-CM

## 2020-06-29 DIAGNOSIS — I619 Nontraumatic intracerebral hemorrhage, unspecified: Secondary | ICD-10-CM | POA: Diagnosis not present

## 2020-06-29 LAB — BASIC METABOLIC PANEL
Anion gap: 13 (ref 5–15)
BUN: 16 mg/dL (ref 8–23)
CO2: 26 mmol/L (ref 22–32)
Calcium: 9 mg/dL (ref 8.9–10.3)
Chloride: 106 mmol/L (ref 98–111)
Creatinine, Ser: 0.82 mg/dL (ref 0.44–1.00)
GFR, Estimated: >60 mL/min (ref >60)
Glucose, Bld: 124 mg/dL — ABNORMAL HIGH (ref 70–99)
Potassium: 4.2 mmol/L (ref 3.5–5.1)
Sodium: 145 mmol/L (ref 135–145)

## 2020-06-29 LAB — CBC WITH DIFFERENTIAL/PLATELET
Abs Immature Granulocytes: 0.04 10*3/uL (ref 0.00–0.07)
Basophils Absolute: 0 10*3/uL (ref 0.0–0.1)
Basophils Relative: 0 %
Eosinophils Absolute: 0.1 10*3/uL (ref 0.0–0.5)
Eosinophils Relative: 1 %
HCT: 38.5 % (ref 36.0–46.0)
Hemoglobin: 12 g/dL (ref 12.0–15.0)
Immature Granulocytes: 1 %
Lymphocytes Relative: 26 %
Lymphs Abs: 2.3 10*3/uL (ref 0.7–4.0)
MCH: 29.3 pg (ref 26.0–34.0)
MCHC: 31.2 g/dL (ref 30.0–36.0)
MCV: 93.9 fL (ref 80.0–100.0)
Monocytes Absolute: 1.4 10*3/uL — ABNORMAL HIGH (ref 0.1–1.0)
Monocytes Relative: 16 %
Neutro Abs: 4.9 10*3/uL (ref 1.7–7.7)
Neutrophils Relative %: 56 %
Platelets: 171 10*3/uL (ref 150–400)
RBC: 4.1 MIL/uL (ref 3.87–5.11)
RDW: 12.7 % (ref 11.5–15.5)
WBC: 8.8 10*3/uL (ref 4.0–10.5)
nRBC: 0 % (ref 0.0–0.2)

## 2020-06-29 LAB — MAGNESIUM: Magnesium: 1.9 mg/dL (ref 1.7–2.4)

## 2020-06-29 MED ORDER — DIVALPROEX SODIUM 125 MG PO CSDR
500.0000 mg | DELAYED_RELEASE_CAPSULE | Freq: Two times a day (BID) | ORAL | Status: DC
  Administered 2020-06-29 – 2020-07-03 (×5): 500 mg via ORAL
  Filled 2020-06-29 (×8): qty 4

## 2020-06-29 MED ORDER — DEXTROSE 5 % IV SOLN: INTRAVENOUS | Status: AC

## 2020-06-29 NOTE — Progress Notes (Signed)
SLP Cancellation Note  Patient Details Name: [name] [name] MRN: [id number] DOB: 1941/06/07   Cancelled treatment:       Reason Eval/Treat Not Completed: Patient's level of consciousness   Jemiah Cuadra, Katherene Ponto 06/29/2020, 10:43 AM

## 2020-06-29 NOTE — Progress Notes (Addendum)
0900 Pt restraints discontinued. Pt is drowsy this morning. Easy to arouse but falls back to sleep quickly. Family is at bedside. Pt complained of R elbow pain with passive movement. Elbow is not swollen and no signs of injury.  Hospitalist Dr. Pietro Cassis notified.

## 2020-06-29 NOTE — Progress Notes (Signed)
Physical Therapy Treatment Patient Details Name: [name] [name] MRN: [id number] DOB: October 31, 1940 Today's Date: 06/29/2020    History of Present Illness Pt is a 79 y/o female admitted secondary to AMS. Found to have UTI. Pt also had EEG which revealed evidence of potential epileptogenicity arising from left more than right frontal region. Pt with recent admission to [facility] for hemmorhagic CVA. PMH includes CVA, HTN, and R THA.     PT Comments    Pt is limited in progress by her lethargy, back pain, and decreased cognitive status at this time. She would initiate tasks intermittently but overall resist or not assist, resulting in her requiring total assist for all bed mobility and maxA to maintain static sitting balance the majority of the time this date. She also minimally followed cues to perform LAQ 1x each but then ceased activity despite max cues to continue. Pt thus was returned to supine. Will continue to follow acutely. Current d/c recommendations remain appropriate.    Follow Up Recommendations  SNF;Supervision/Assistance - 24 hour     Equipment Recommendations  Other (comment) (TBD)    Recommendations for Other Services       Precautions / Restrictions Precautions Precautions: Fall Restrictions Weight Bearing Restrictions: No    Mobility  Bed Mobility Overal bed mobility: Needs Assistance Bed Mobility: Supine to Sit;Sit to Supine     Supine to sit: Total assist Sit to supine: Total assist   General bed mobility comments: Cued pt to move B LEs towards R EOB, with initiation noted initially but then pt ceased moving legs. Hand-over-hand cues for L hand placement on R bed rail, but pt resisted and refused hand placement due to back pain. No activation/assistance noted with supine <> sit, thus totalA for all.  Transfers                 General transfer comment: not attemtped  Ambulation/Gait             General Gait Details: unable at this  time   Stairs             Wheelchair Mobility    Modified Rankin (Stroke Patients Only) Modified Rankin (Stroke Patients Only) Pre-Morbid Rankin Score: Slight disability Modified Rankin: Severe disability     Balance Overall balance assessment: Needs assistance Sitting-balance support: Bilateral upper extremity supported;Feet supported Sitting balance-Leahy Scale: Zero Sitting balance - Comments: Pt resistive due to back pain and thus would lean posteriorly despite max cues to lean anteriorly, maxA to maintain balance majority of time. Intermittent bouts of minA-min guard assist. Postural control: Posterior lean                                  Cognition Arousal/Alertness: Lethargic Behavior During Therapy: Flat affect Overall Cognitive Status: Difficult to assess Area of Impairment: Orientation;Attention;Memory;Following commands;Safety/judgement;Awareness;Problem solving                 Orientation Level: Disoriented to;Place;Time;Situation Current Attention Level: Sustained Memory: Decreased short-term memory Following Commands: Follows one step commands inconsistently Safety/Judgement: Decreased awareness of safety;Decreased awareness of deficits Awareness: Intellectual Problem Solving: Slow processing;Decreased initiation;Difficulty sequencing;Requires verbal cues;Requires tactile cues General Comments: Pt aroused by sternal rubs. Pt oriented to self but reported it being "[birth year]" and that she was in a grocery store when provided options of school, grocery store, or hospital. Minimal to no activation to assist with all tasks. Pt resisting sitting up  and thus unaware of safety precuations.      Exercises      General Comments        Pertinent Vitals/Pain Pain Assessment: Faces Faces Pain Scale: Hurts even more Pain Location: back Pain Descriptors / Indicators: Grimacing;Guarding Pain Intervention(s): Limited activity within patient's  tolerance;Monitored during session;Repositioned;Patient requesting pain meds-RN notified    Home Living                      Prior Function            PT Goals (current goals can now be found in the care plan section) Acute Rehab PT Goals Patient Stated Goal: to lay down PT Goal Formulation: Patient unable to participate in goal setting Time For Goal Achievement: 07/11/20 Potential to Achieve Goals: Fair Progress towards PT goals: Not progressing toward goals - comment (lethargy and confusion along with pain limiting her)    Frequency    Min 3X/week      PT Plan Current plan remains appropriate    Co-evaluation              AM-PAC PT "6 Clicks" Mobility   Outcome Measure  Help needed turning from your back to your side while in a flat bed without using bedrails?: Total Help needed moving from lying on your back to sitting on the side of a flat bed without using bedrails?: Total Help needed moving to and from a bed to a chair (including a wheelchair)?: Total Help needed standing up from a chair using your arms (e.g., wheelchair or bedside chair)?: Total Help needed to walk in hospital room?: Total Help needed climbing 3-5 steps with a railing? : Total 6 Click Score: 6    End of Session   Activity Tolerance: Patient limited by lethargy;Patient limited by pain Patient left: in bed;with call bell/phone within reach;with bed alarm set Nurse Communication: Mobility status;Patient requests pain meds PT Visit Diagnosis: Muscle weakness (generalized) (M62.81);Difficulty in walking, not elsewhere classified (R26.2);Other symptoms and signs involving the nervous system (R29.898)     Time: 1224-4975 PT Time Calculation (min) (ACUTE ONLY): 17 min  Charges:  $Neuromuscular Re-education: 8-22 mins                     [name] [name], PT, DPT Acute Rehabilitation Services  Pager: [phone] Office: [phone]    [name] [name] 06/29/2020, 2:35 PM

## 2020-06-29 NOTE — Progress Notes (Signed)
Attempted to feed patient dinner. She did take a couple bites of her ice cream but then refused any more food or beverage. Minimal appetite.

## 2020-06-29 NOTE — Progress Notes (Signed)
Patient ID: [name] [name], female   DOB: 02-Jun-1941, 79 y.o.   MRN: [id number]  This NP reviewed medical records, received report from team and then visited patient at the bedside as a follow up to  yesterday's [place], for palliative medicine needs and emotional support and to meet with patient's two sons, as scheduled, for continued conversation regarding current medical situation; diagnosis, prognosis, goals of care, advanced directive decisions, treatment options decisions and anticipatory care needs.  A detailed discussion was had today regarding patient's current medical situation.  The difference between an aggressive medical intervention path and a palliative comfort path for this patient at this time in this situation was detailed.  Questions and concerns were addressed  Both sons verbalized an understanding of the seriousness of the patient's current medical situation and the likely long-term poor prognosis.  Both are able to verbalize their main goal for the patient at this time is comfort and dignity.  Education regarding hospice benefit was offered.  A MOST form was introduced and a hard choices booklet was left for review.  Discussed with sons  the importance of continued conversation with family and the medical providers regarding overall plan of care and treatment options,  ensuring decisions are within the context of the patients values and GOCs.  Sons plan to meet and discuss the patient's current medical situation with other family members as they make decisions moving forward with treatment plan.  Plan is to meet again on Sunday at 2:00 for further clarification of goals of care.  Plan of care:  -DNR/DNI-documented today -Continue with current treatment plan over the next 48 hours, both sons plan to continue today's conversation with each other and other family members as they make decisions regarding future treatment plan -Meet on Sunday at 2:00 with this nurse  practitioner for continued conversation.   Questions and concerns addressed   Discussed with Dr Pietro Cassis  Total time spent on the unit was 60 minutes  Greater than 50% of the time was spent in counseling and coordination of care  Wadie Lessen NP  Palliative Medicine Team Team Phone # [phone] Pager [phone]

## 2020-06-29 NOTE — Progress Notes (Signed)
PROGRESS NOTE  [name]  DOB: 20-May-1941  PCP: Burnis Medin, MD EXN:[id number]  DOA: 06/25/2020  LOS: 4 days   Chief complaint: Altered mental status  Brief narrative: [name] [name] is a 79 y.o. female who was brought to the ED on 06/25/2020 for altered mental status. PMH significant for amyloid angiopathy, recent hemorrhagic stroke10/15/2021 at [facility] -residual cognitive deficits, poststroke aphasia, hypertension,anxiety/depression,hypothyroidism. Patient was recently hospitalized 11/4-11/6 here at [facility] for encephalopathy.   She is cared for by her son at home.  She was noted to be typically confused on a normal day and "lives in the past" however was noted to become more agitated than usual, therefore was brought to the ER.  In the ER she underwent CT head which showed a 2.5 cm new intracranial hemorrhage.Patient was discussed with neurosurgery who recommended admission by hospital service for observation and consultation with neurology.  Subjective: Patient was seen and examined this morning. Completely sedated.  Grimaces on sternal rub and also on movement of her right elbow.  Son at bedside.  Palliative care NP at bedside.  Assessment/Plan: Nontraumatic intracerebral hemorrhage History of cerebral amyloid angiopathy -05/26/2020, patient had hemorrhagic stroke with residual cognitive deficits and post stroke epilepsy. -11/4, patient was hospitalized for worsening mental status.  CT scan of head showed improving hemorrhage.   -11/14, readmitted for worsening confusion, hallucination, delusion.  CT scan of head showed a new intracranial hemorrhage measuring 2.5 cm in diameter. -11/15, repeat CT head showed stable findings. -No surgical intervention indicated per neurosurgery -Neurology consultation appreciated.  Patient is not on any antithrombotics. -For cerebral amyloid angiopathy, patient follows up with Dr. Charlene Brooke at [facility] neurology.  Acute  metabolic encephalopathy -Multifactorial in setting of underlying dementia, intracranial hemorrhage, hypernatremia, Depakote, Seroquel. -EEG on 11/5 showed potential etiopathogenicity but no seizure.  Given seizure potential, patient was started on Depakote 500 mg 3 times daily.  With worsening sedation, I will reduce the frequency to 500 mg twice daily. -Needs Depakote level monitoring as an outpatient.  -QTC 404 ms. -Continue Seroquel.   Acute hypernatremia -Sodium level peaked at 149, improving to dextrose drip.  Continue to monitor Recent Labs  Lab 06/25/20 0617 06/26/20 0455 06/27/20 0440 06/28/20 0330 06/29/20 0338  NA 141 144 148* 149* 145   Right elbow tenderness -Does not look swollen, red but significantly tender on any passive movement. -Intermittent spikes of fever.  No evidence of septic arthritis at this time.  Continue to monitor clinically.  Hypothyroidism -Continue Synthroid  Essential hypertension -Continue amlodipine  Goals of care -Patient currently full code.  I explained to patient and family that patient probably does not have a chance of meaningful neurological recovery.  Palliative care was discussing with family this morning..  Mobility: Physically strong. Code Status:   Code Status: Full Code  Nutritional status: There is no height or weight on file to calculate BMI.     Diet Order            Diet Heart Room service appropriate? Yes; Fluid consistency: Thin  Diet effective now                 DVT prophylaxis: Place and maintain sequential compression device Start: 06/26/20 1812 Place TED hose Start: 06/25/20 1010   Antimicrobials: none Fluid: D5 at 75 mL/h Consultants: neurology Family Communication:  Discussed with son Mr. Legrand Como at bedside   Status is: Inpatient  Remains inpatient appropriate because:Altered mental status   Dispo: The patient  is from: Home              Anticipated d/c is to: Likely unable to be discharged  home.  May need SNF versus hospice.  Palliative care following.              Anticipated d/c date is: 2 days              Patient currently is not medically stable to d/c.  Infusions:  . dextrose 75 mL/hr at 06/29/20 1108    Scheduled Meds: . amLODipine  10 mg Oral Daily  . divalproex  500 mg Oral Q12H  . levothyroxine  100 mcg Oral Q0600  . melatonin  6 mg Oral QHS  . potassium chloride  40 mEq Oral Once  . QUEtiapine  12.5 mg Oral BID    Antimicrobials: Anti-infectives (From admission, onward)   None      PRN meds: acetaminophen, labetalol, polyethylene glycol   Objective: Vitals:   06/29/20 0347 06/29/20 0831  BP: (!) 118/53 (!) 123/58  Pulse: 75 72  Resp: 18 18  Temp: 99.3 F (37.4 C) 97.8 F (36.6 C)  SpO2: 96% 97%    Intake/Output Summary (Last 24 hours) at 06/29/2020 1108 Last data filed at 06/29/2020 0600 Gross per 24 hour  Intake 1700.86 ml  Output 750 ml  Net 950.86 ml   There were no vitals filed for this visit. Weight change:  There is no height or weight on file to calculate BMI.   Physical Exam: General exam: Somnolent, grimaces on pain Skin: No rashes, lesions or ulcers. HEENT: Atraumatic, normocephalic, supple neck, no obvious bleeding Lungs: Clear to auscultation bilaterally CVS: Regular rate and rhythm, no murmur  GI/Abd soft, nontender, nondistended, bowel sound present CNS: Somnolent, grimaces on pain Psychiatry: Depressed look Extremities: No pedal edema, no calf tenderness.  Right elbow tender on any passive movements.  Data Review: I have personally reviewed the laboratory data and studies available.  Recent Labs  Lab 06/25/20 0617 06/26/20 0455 06/27/20 0440 06/28/20 0330 06/29/20 0338  WBC 7.6 7.9 7.6 8.3 8.8  NEUTROABS 5.7  --  5.5 5.2 4.9  HGB 11.0* 11.1* 10.1* 12.2 12.0  HCT 36.1 34.2* 32.5* 39.0 38.5  MCV 95.0 91.7 94.5 94.2 93.9  PLT 148* PLATELET CLUMPS NOTED ON SMEAR, UNABLE TO ESTIMATE 192 152 171   Recent  Labs  Lab 06/25/20 0617 06/26/20 0455 06/27/20 0440 06/28/20 0330 06/29/20 0338  NA 141 144 148* 149* 145  K 3.2* 4.1 3.4* 3.5 4.2  CL 104 107 108 113* 106  CO2 24 24 24 23 26   GLUCOSE 115* 83 77 119* 124*  BUN 20 18 20 16 16   CREATININE 0.93 0.91 1.01* 0.86 0.82  CALCIUM 9.3 8.7* 9.5 9.3 9.0  MG  --   --  1.9 2.1 1.9    F/u labs ordered.  Signed, Terrilee Croak, MD [facility] 06/29/2020

## 2020-06-30 ENCOUNTER — Ambulatory Visit: Payer: Medicare Other

## 2020-06-30 ENCOUNTER — Ambulatory Visit: Payer: Medicare Other | Admitting: Occupational Therapy

## 2020-06-30 ENCOUNTER — Encounter (HOSPITAL_COMMUNITY): Payer: Self-pay | Admitting: Internal Medicine

## 2020-06-30 DIAGNOSIS — I619 Nontraumatic intracerebral hemorrhage, unspecified: Secondary | ICD-10-CM | POA: Diagnosis not present

## 2020-06-30 LAB — CBC WITH DIFFERENTIAL/PLATELET
Abs Immature Granulocytes: 0.02 10*3/uL (ref 0.00–0.07)
Basophils Absolute: 0 10*3/uL (ref 0.0–0.1)
Basophils Relative: 0 %
Eosinophils Absolute: 0.1 10*3/uL (ref 0.0–0.5)
Eosinophils Relative: 1 %
HCT: 35 % — ABNORMAL LOW (ref 36.0–46.0)
Hemoglobin: 11.1 g/dL — ABNORMAL LOW (ref 12.0–15.0)
Immature Granulocytes: 0 %
Lymphocytes Relative: 24 %
Lymphs Abs: 1.6 10*3/uL (ref 0.7–4.0)
MCH: 29.4 pg (ref 26.0–34.0)
MCHC: 31.7 g/dL (ref 30.0–36.0)
MCV: 92.8 fL (ref 80.0–100.0)
Monocytes Absolute: 1.1 10*3/uL — ABNORMAL HIGH (ref 0.1–1.0)
Monocytes Relative: 16 %
Neutro Abs: 3.9 10*3/uL (ref 1.7–7.7)
Neutrophils Relative %: 59 %
Platelets: 196 10*3/uL (ref 150–400)
RBC: 3.77 MIL/uL — ABNORMAL LOW (ref 3.87–5.11)
RDW: 12.5 % (ref 11.5–15.5)
WBC: 6.7 10*3/uL (ref 4.0–10.5)
nRBC: 0 % (ref 0.0–0.2)

## 2020-06-30 LAB — BASIC METABOLIC PANEL
Anion gap: 10 (ref 5–15)
BUN: 15 mg/dL (ref 8–23)
CO2: 28 mmol/L (ref 22–32)
Calcium: 8.6 mg/dL — ABNORMAL LOW (ref 8.9–10.3)
Chloride: 105 mmol/L (ref 98–111)
Creatinine, Ser: 0.74 mg/dL (ref 0.44–1.00)
GFR, Estimated: >60 mL/min (ref >60)
Glucose, Bld: 109 mg/dL — ABNORMAL HIGH (ref 70–99)
Potassium: 3.2 mmol/L — ABNORMAL LOW (ref 3.5–5.1)
Sodium: 143 mmol/L (ref 135–145)

## 2020-06-30 LAB — GLUCOSE, CAPILLARY: Glucose-Capillary: 113 mg/dL — ABNORMAL HIGH (ref 70–99)

## 2020-06-30 LAB — MAGNESIUM: Magnesium: 1.9 mg/dL (ref 1.7–2.4)

## 2020-06-30 MED ORDER — POTASSIUM CHLORIDE CRYS ER 20 MEQ PO TBCR
40.0000 meq | EXTENDED_RELEASE_TABLET | Freq: Once | ORAL | Status: AC
  Administered 2020-06-30: 40 meq via ORAL
  Filled 2020-06-30: qty 2

## 2020-06-30 NOTE — Progress Notes (Signed)
Upon attempting to assess patient and administer her potassium, pt was yelling at this nurse to "get out of my house, you Bitch!' Attempted to reorient patient and assure her this nurse was giving her morning medicine and needed to assess her. Pt swung her arms and scratched this nurse with her fingernails on the forearm. Did get most of her potassium in her.

## 2020-06-30 NOTE — Progress Notes (Signed)
PROGRESS NOTE  [name] [name]  DOB: 1940/11/06  PCP: Burnis Medin, MD INO:[id number]  DOA: 06/25/2020  LOS: 5 days   Chief complaint: Altered mental status  Brief narrative: [name] [name] is a 79 y.o. female who was brought to the ED on 06/25/2020 for altered mental status. PMH significant for amyloid angiopathy, recent hemorrhagic stroke10/15/2021 at [facility] -residual cognitive deficits, poststroke aphasia, hypertension,anxiety/depression,hypothyroidism. Patient was recently hospitalized 11/4-11/6 here at [facility] for encephalopathy.   She is cared for by her son at home.  She was noted to be typically confused on a normal day and "lives in the past" however was noted to become more agitated than usual, therefore was brought to the ER.  In the ER she underwent CT head which showed a 2.5 cm new intracranial hemorrhage.Patient was discussed with neurosurgery who recommended admission by hospital service for observation and consultation with neurology. Patient has remained delirious for most of her hospitalization, she has required intermittent doses of IV Haldol and IV Ativan.  Palliative care consultation was obtained. Patient was made DNR/DNI.   Subjective: Patient was seen and examined this morning. Awake, alert, able to answer this morning.  Remains confused.  Not restless or agitated today.   Assessment/Plan: Nontraumatic intracerebral hemorrhage History of cerebral amyloid angiopathy -05/26/2020, patient had hemorrhagic stroke with residual cognitive deficits and post stroke epilepsy. -11/4, patient was hospitalized for worsening mental status.  CT scan of head showed improving hemorrhage.   -11/14, readmitted for worsening confusion, hallucination, delusion.  CT scan of head showed a new intracranial hemorrhage measuring 2.5 cm in diameter. -11/15, repeat CT head showed stable findings. -No surgical intervention indicated per neurosurgery -Neurology  consultation appreciated.  Patient is not on any antithrombotics. -For cerebral amyloid angiopathy, patient follows up with Dr. Charlene Brooke at [facility] neurology.  Acute metabolic encephalopathy -Multifactorial in setting of underlying dementia, intracranial hemorrhage, hypernatremia, Depakote, Seroquel. -EEG on 11/5 showed potential etiopathogenicity but no seizure.  Given seizure potential, patient was started on Depakote 500 mg 3 times daily.   -11/18, with worsening sedation, I reduced the frequency to 500 mg twice daily.  -Needs Depakote level monitoring as an outpatient.  -QTC 404 ms. -Continue Seroquel.   Acute hypernatremia -Sodium level peaked at 149, improved with dextrose drip. Recent Labs  Lab 06/25/20 0617 06/26/20 0455 06/27/20 0440 06/28/20 0330 06/29/20 0338 06/30/20 0305  NA 141 144 148* 149* 145 143   Right elbow tenderness -11/18, patient was noted to have significant right elbow tenderness on any passive movement.   -Not much tender this morning on my examination.  Continue to monitor.    Hypothyroidism -Continue Synthroid  Essential hypertension -Continue amlodipine  Goals of care -Palliative care consult appreciated.  DNR/DNI.  -Family members plan to continue conversations with his other make a decision regarding future treatment plan.  Palliative care to meet with family again on Sunday.   Mobility: Physically strong. Code Status:   Code Status: DNR  Nutritional status: Body mass index is 30.79 kg/m.     Diet Order            Diet Heart Room service appropriate? Yes; Fluid consistency: Thin  Diet effective now                 DVT prophylaxis: Place and maintain sequential compression device Start: 06/26/20 1812 Place TED hose Start: 06/25/20 1010   Antimicrobials: none Fluid: None Consultants: neurology Family Communication:  Not at bedside   Status is:  Inpatient  Remains inpatient appropriate because:Altered mental status   Dispo:  The patient is from: Home              Anticipated d/c is to: Unable to discharge to home.  Likely SNF with hospice care              Anticipated d/c date is: Unclear at              Patient currently is not medically stable to d/c.  Infusions:    Scheduled Meds: . amLODipine  10 mg Oral Daily  . divalproex  500 mg Oral Q12H  . levothyroxine  100 mcg Oral Q0600  . melatonin  6 mg Oral QHS  . QUEtiapine  12.5 mg Oral BID    Antimicrobials: Anti-infectives (From admission, onward)   None      PRN meds: acetaminophen, labetalol, polyethylene glycol   Objective: Vitals:   06/30/20 0902 06/30/20 1125  BP: 139/62 139/68  Pulse: 76 79  Resp: 20 16  Temp: 99.2 F (37.3 C) (!) 97.5 F (36.4 C)  SpO2: 94% 98%    Intake/Output Summary (Last 24 hours) at 06/30/2020 1302 Last data filed at 06/30/2020 0700 Gross per 24 hour  Intake 1300.9 ml  Output 803 ml  Net 497.9 ml   There were no vitals filed for this visit. Weight change:  Body mass index is 30.79 kg/m.   Physical Exam: General exam: Not in distress, Skin: No rashes, lesions or ulcers. HEENT: Atraumatic, normocephalic, supple neck, no obvious bleeding Lungs: Clear to auscultation bilaterally CVS: Regular rate and rhythm, no murmur  GI/Abd soft, nontender, nondistended, bowel sound present CNS: Alert, awake, able to answer her name.  Not restless or agitated today.   Psychiatry: Depressed look Extremities: No pedal edema, no calf tenderness.  Right elbow tender on any passive movements.  Data Review: I have personally reviewed the laboratory data and studies available.  Recent Labs  Lab 06/25/20 0617 06/25/20 0617 06/26/20 0455 06/27/20 0440 06/28/20 0330 06/29/20 0338 06/30/20 0305  WBC 7.6   < > 7.9 7.6 8.3 8.8 6.7  NEUTROABS 5.7  --   --  5.5 5.2 4.9 3.9  HGB 11.0*   < > 11.1* 10.1* 12.2 12.0 11.1*  HCT 36.1   < > 34.2* 32.5* 39.0 38.5 35.0*  MCV 95.0   < > 91.7 94.5 94.2 93.9 92.8  PLT 148*   <  > PLATELET CLUMPS NOTED ON SMEAR, UNABLE TO ESTIMATE 192 152 171 196   < > = values in this interval not displayed.   Recent Labs  Lab 06/26/20 0455 06/27/20 0440 06/28/20 0330 06/29/20 0338 06/30/20 0305  NA 144 148* 149* 145 143  K 4.1 3.4* 3.5 4.2 3.2*  CL 107 108 113* 106 105  CO2 24 24 23 26 28   GLUCOSE 83 77 119* 124* 109*  BUN 18 20 16 16 15   CREATININE 0.91 1.01* 0.86 0.82 0.74  CALCIUM 8.7* 9.5 9.3 9.0 8.6*  MG  --  1.9 2.1 1.9 1.9    F/u labs ordered.  Signed, Terrilee Croak, MD [facility] 06/30/2020

## 2020-06-30 NOTE — Progress Notes (Signed)
Manufacturing engineer [facility]) Hospital Liaison note.    This pt's sister Maylon Peppers called [place] directly to ask about touring [facility] ([facility] for end of life care.  This RN reviewed the normal channel for [facility] referrals and provided active listening. Ms Bobbye Charleston was appreciative of information re [facility].    Chrislyn Edison Pace, BSN, RN [place] (listed on [place] under Hospice/[facility])    [phone]

## 2020-06-30 NOTE — Progress Notes (Signed)
This chaplain responded PMT consult for spiritual care.  The Pt. is asleep and does not respond to her name. This chaplain will F/U in coordination with the Pt. and/or PMT.

## 2020-06-30 NOTE — Progress Notes (Signed)
SLP Cancellation Note  Patient Details Name: [name] [name] MRN: [id number] DOB: 1941/02/22   Cancelled treatment:       Reason Eval/Treat Not Completed: Patient's level of consciousness;Fatigue/lethargy limiting ability to participate. Pt was approached for evaluation, but she was unable to demonstrate an adequate level of alertness to participate. She vocalized with verbal and tactile stimulation, but was did not open her eyes or interact further. SLP will follow up and re-attempt eval on subsequent date.   Zyaira Vejar I. Hardin Negus, [place], [place] Office number [phone] Pager [place] 06/30/2020, 3:05 PM

## 2020-06-30 NOTE — Plan of Care (Signed)
  Problem: Education: Goal: Knowledge of disease or condition will improve Outcome: Progressing Goal: Knowledge of secondary prevention will improve Outcome: Progressing Goal: Knowledge of patient specific risk factors addressed and post discharge goals established will improve Outcome: Progressing   Problem: Intracerebral Hemorrhage Tissue Perfusion: Goal: Complications of Intracerebral Hemorrhage will be minimized Outcome: Progressing   Problem: Education: Goal: Knowledge of General Education information will improve Description: Including pain rating scale, medication(s)/side effects and non-pharmacologic comfort measures Outcome: Progressing   Problem: Health Behavior/Discharge Planning: Goal: Ability to manage health-related needs will improve Outcome: Progressing   Problem: Clinical Measurements: Goal: Ability to maintain clinical measurements within normal limits will improve Outcome: Progressing Goal: Will remain free from infection Outcome: Progressing Goal: Diagnostic test results will improve Outcome: Progressing Goal: Respiratory complications will improve Outcome: Progressing Goal: Cardiovascular complication will be avoided Outcome: Progressing   Problem: Activity: Goal: Risk for activity intolerance will decrease Outcome: Progressing   Problem: Nutrition: Goal: Adequate nutrition will be maintained Outcome: Progressing   Problem: Coping: Goal: Level of anxiety will decrease Outcome: Progressing   Problem: Elimination: Goal: Will not experience complications related to bowel motility Outcome: Progressing Goal: Will not experience complications related to urinary retention Outcome: Progressing   Problem: Pain Managment: Goal: General experience of comfort will improve Outcome: Progressing   Problem: Safety: Goal: Ability to remain free from injury will improve Outcome: Progressing   Problem: Skin Integrity: Goal: Risk for impaired skin  integrity will decrease Outcome: Progressing

## 2020-06-30 NOTE — Progress Notes (Signed)
Physical Therapy Treatment Patient Details Name: [name] [name] MRN: [id number] DOB: Dec 24, 1940 Today's Date: 06/30/2020    History of Present Illness Pt is a 79 y/o female admitted secondary to AMS. Found to have UTI. Pt also had EEG which revealed evidence of potential epileptogenicity arising from left more than right frontal region. Pt with recent admission to [facility] for hemmorhagic CVA. PMH includes CVA, HTN, and R THA.     PT Comments    Pt is making slow progress. In support, she progressed from requiring maxA to maintain static sitting balance to only requiring minA-min guard assist. She does continue to display a posterior lean that increases with fatigue though. Pt remains very confused and is not oriented to self this date, limiting her ability to follow cues to assist with bed mobility thereby she required total assist for supine <> sit transitions. Unable to follow cues to perform leg exercises this date. Communicated with nurse in regards to floating heels to prevent pressure ulcers as heels were noted to be boggy today. Heels floating with use of towel roll at end of session. Will continue to follow acutely. Current recommendations remain appropriate.    Follow Up Recommendations  SNF;Supervision/Assistance - 24 hour     Equipment Recommendations  Other (comment) (TBD)    Recommendations for Other Services       Precautions / Restrictions Precautions Precautions: Fall Restrictions Weight Bearing Restrictions: No    Mobility  Bed Mobility Overal bed mobility: Needs Assistance Bed Mobility: Supine to Sit;Sit to Supine     Supine to sit: Total assist Sit to supine: Total assist   General bed mobility comments: Cued pt to move B LEs towards R EOB, with initiation noted initially but then pt ceased moving legs. Hand-over-hand cues for L hand placement on R bed rail, but pt resisted and refused hand placement due to back pain. No activation/assistance noted with  supine <> sit, thus totalA for all.  Transfers                 General transfer comment: not attempted  Ambulation/Gait             General Gait Details: unable at this time   Stairs             Wheelchair Mobility    Modified Rankin (Stroke Patients Only) Modified Rankin (Stroke Patients Only) Pre-Morbid Rankin Score: Slight disability Modified Rankin: Severe disability     Balance Overall balance assessment: Needs assistance Sitting-balance support: Bilateral upper extremity supported;Feet supported Sitting balance-Leahy Scale: Poor Sitting balance - Comments: Initially resistive and leaning posteriorly requiring maxA but progressed to pt sitting more upright with bilat UE support and min guard-minA to maintain balance. Postural control: Posterior lean                                  Cognition Arousal/Alertness: Lethargic Behavior During Therapy: Flat affect;Agitated Overall Cognitive Status: Difficult to assess Area of Impairment: Orientation;Attention;Memory;Following commands;Safety/judgement;Awareness;Problem solving                 Orientation Level: Disoriented to;Place;Time;Situation;Person Current Attention Level: Sustained Memory: Decreased short-term memory Following Commands: Follows one step commands inconsistently Safety/Judgement: Decreased awareness of safety;Decreased awareness of deficits Awareness: Intellectual Problem Solving: Slow processing;Decreased initiation;Difficulty sequencing;Requires verbal cues;Requires tactile cues General Comments: Pt aroused by sternal rubs. Pt stated her name was "michael", not oriented to self. Minimal to no activation  to assist with all tasks. Pt resisting sitting up and thus unaware of safety precuations.      Exercises      General Comments        Pertinent Vitals/Pain Pain Assessment: Faces Faces Pain Scale: Hurts even more Pain Location: back; generalized Pain  Descriptors / Indicators: Grimacing;Guarding;Moaning Pain Intervention(s): Limited activity within patient's tolerance;Monitored during session;Repositioned    Home Living                      Prior Function            PT Goals (current goals can now be found in the care plan section) Acute Rehab PT Goals Patient Stated Goal: to lay down PT Goal Formulation: Patient unable to participate in goal setting Time For Goal Achievement: 07/11/20 Potential to Achieve Goals: Fair Progress towards PT goals: Progressing toward goals (slowly)    Frequency    Min 3X/week      PT Plan Current plan remains appropriate    Co-evaluation              AM-PAC PT "6 Clicks" Mobility   Outcome Measure  Help needed turning from your back to your side while in a flat bed without using bedrails?: Total Help needed moving from lying on your back to sitting on the side of a flat bed without using bedrails?: Total Help needed moving to and from a bed to a chair (including a wheelchair)?: Total Help needed standing up from a chair using your arms (e.g., wheelchair or bedside chair)?: Total Help needed to walk in hospital room?: Total Help needed climbing 3-5 steps with a railing? : Total 6 Click Score: 6    End of Session   Activity Tolerance: Patient limited by lethargy;Patient limited by pain;Treatment limited secondary to agitation Patient left: in bed;with call bell/phone within reach;with bed alarm set Nurse Communication: Mobility status;Precautions (to float heels) PT Visit Diagnosis: Muscle weakness (generalized) (M62.81);Difficulty in walking, not elsewhere classified (R26.2);Other symptoms and signs involving the nervous system (R29.898)     Time: 0539-7673 PT Time Calculation (min) (ACUTE ONLY): 28 min  Charges:  $Therapeutic Activity: 8-22 mins $Neuromuscular Re-education: 8-22 mins                     [name] [name], PT, DPT Acute Rehabilitation  Services  Pager: [phone] Office: [phone]    [name] [name] 06/30/2020, 12:08 PM

## 2020-07-01 DIAGNOSIS — I619 Nontraumatic intracerebral hemorrhage, unspecified: Secondary | ICD-10-CM | POA: Diagnosis not present

## 2020-07-01 LAB — MAGNESIUM: Magnesium: 1.8 mg/dL (ref 1.7–2.4)

## 2020-07-01 LAB — CBC WITH DIFFERENTIAL/PLATELET
Abs Immature Granulocytes: 0.04 10*3/uL (ref 0.00–0.07)
Basophils Absolute: 0 10*3/uL (ref 0.0–0.1)
Basophils Relative: 0 %
Eosinophils Absolute: 0 10*3/uL (ref 0.0–0.5)
Eosinophils Relative: 0 %
HCT: 36.2 % (ref 36.0–46.0)
Hemoglobin: 11.5 g/dL — ABNORMAL LOW (ref 12.0–15.0)
Immature Granulocytes: 0 %
Lymphocytes Relative: 18 %
Lymphs Abs: 1.7 10*3/uL (ref 0.7–4.0)
MCH: 29.6 pg (ref 26.0–34.0)
MCHC: 31.8 g/dL (ref 30.0–36.0)
MCV: 93.3 fL (ref 80.0–100.0)
Monocytes Absolute: 2 10*3/uL — ABNORMAL HIGH (ref 0.1–1.0)
Monocytes Relative: 20 %
Neutro Abs: 5.9 10*3/uL (ref 1.7–7.7)
Neutrophils Relative %: 62 %
Platelets: 221 10*3/uL (ref 150–400)
RBC: 3.88 MIL/uL (ref 3.87–5.11)
RDW: 12.7 % (ref 11.5–15.5)
WBC: 9.6 10*3/uL (ref 4.0–10.5)
nRBC: 0 % (ref 0.0–0.2)

## 2020-07-01 LAB — BASIC METABOLIC PANEL
Anion gap: 11 (ref 5–15)
BUN: 15 mg/dL (ref 8–23)
CO2: 28 mmol/L (ref 22–32)
Calcium: 8.8 mg/dL — ABNORMAL LOW (ref 8.9–10.3)
Chloride: 104 mmol/L (ref 98–111)
Creatinine, Ser: 0.76 mg/dL (ref 0.44–1.00)
GFR, Estimated: >60 mL/min (ref >60)
Glucose, Bld: 103 mg/dL — ABNORMAL HIGH (ref 70–99)
Potassium: 3.5 mmol/L (ref 3.5–5.1)
Sodium: 143 mmol/L (ref 135–145)

## 2020-07-01 NOTE — Progress Notes (Signed)
PROGRESS NOTE  [name] [name]  DOB: 1941/05/05  PCP: Burnis Medin, MD KKX:[id number]  DOA: 06/25/2020  LOS: 6 days   Chief complaint: Altered mental status  Brief narrative: [name] [name] is a 79 y.o. female who was brought to the ED on 06/25/2020 for altered mental status. PMH significant for amyloid angiopathy, recent hemorrhagic stroke10/15/2021 at [facility] -residual cognitive deficits, poststroke aphasia, hypertension,anxiety/depression,hypothyroidism. Patient was recently hospitalized 11/4-11/6 here at [facility] for encephalopathy.   She is cared for by her son at home.  She was noted to be typically confused on a normal day and "lives in the past" however was noted to become more agitated than usual, therefore was brought to the ER.  In the ER she underwent CT head which showed a 2.5 cm new intracranial hemorrhage.Patient was discussed with neurosurgery who recommended admission by hospital service for observation and consultation with neurology. Patient has remained delirious for most of her hospitalization, she has required intermittent doses of IV Haldol and IV Ativan.  Palliative care consultation was obtained. Patient was made DNR/DNI.   Subjective: Patient was seen and examined this morning. Somnolent.  Tries to open eyes on verbal command, mumbles. Son at bedside. She had 2 episode of fever morning.  WBC count normal  Assessment/Plan: Nontraumatic intracerebral hemorrhage History of cerebral amyloid angiopathy -05/26/2020, patient had hemorrhagic stroke with residual cognitive deficits and post stroke epilepsy. -11/4, patient was hospitalized for worsening mental status.  CT scan of head showed improving hemorrhage.   -11/14, readmitted for worsening confusion, hallucination, delusion.  CT scan of head showed a new intracranial hemorrhage measuring 2.5 cm in diameter. -11/15, repeat CT head showed stable findings. -No surgical intervention indicated  per neurosurgery -Neurology consultation appreciated.  Patient is not on any antithrombotics. -For cerebral amyloid angiopathy, patient follows up with Dr. Charlene Brooke at [facility] neurology.  Acute metabolic encephalopathy -Multifactorial in setting of underlying dementia, intracranial hemorrhage, hypernatremia, Depakote, Seroquel. -EEG on 11/5 showed potential etiopathogenicity but no seizure.  Given seizure potential, patient was started on Depakote 500 mg 3 times daily.   -11/18, with worsening sedation, I reduced the frequency to 500 mg twice daily.  -QTC 404 ms on EKG from 11/15. -Continue Seroquel.   Low-grade temperature -Temperature more than 100.5 yesterday evening.  Normalized by now. -WBC normal.  Tempted likely related to blood clots.  No evidence of infection.  Acute hypernatremia -Sodium level peaked at 149, improved with dextrose drip. Recent Labs  Lab 06/25/20 0617 06/26/20 0455 06/27/20 0440 06/28/20 0330 06/29/20 0338 06/30/20 0305 07/01/20 0215  NA 141 144 148* 149* 145 143 143   Hypothyroidism -Continue Synthroid  Essential hypertension -Continue amlodipine  Goals of care -Palliative care consult appreciated.  DNR/DNI.  -Family members plan to continue conversations with his other make a decision regarding future treatment plan.  Palliative care to meet with family again this weekend.  Mobility: Physically strong. Code Status:   Code Status: DNR  Nutritional status: Body mass index is 30.79 kg/m.     Diet Order            Diet Heart Room service appropriate? Yes; Fluid consistency: Thin  Diet effective now                 DVT prophylaxis: Place and maintain sequential compression device Start: 06/26/20 1812 Place TED hose Start: 06/25/20 1010   Antimicrobials: none Fluid: None Consultants: neurology Family Communication:  Not at bedside   Status is: Inpatient  Remains inpatient appropriate because:Altered mental status   Dispo: The  patient is from: Home              Anticipated d/c is to: Unable to discharge to home.  Likely SNF with hospice care              Anticipated d/c date is: Unclear at this time              Patient currently is not medically stable to d/c.  Infusions:    Scheduled Meds: . amLODipine  10 mg Oral Daily  . divalproex  500 mg Oral Q12H  . levothyroxine  100 mcg Oral Q0600  . melatonin  6 mg Oral QHS  . QUEtiapine  12.5 mg Oral BID    Antimicrobials: Anti-infectives (From admission, onward)   None      PRN meds: acetaminophen, labetalol, polyethylene glycol   Objective: Vitals:   07/01/20 0441 07/01/20 0812  BP: (!) 137/56 (!) 142/63  Pulse: 88 88  Resp: 20 18  Temp: 98.3 F (36.8 C) 98.2 F (36.8 C)  SpO2: 95% 98%    Intake/Output Summary (Last 24 hours) at 07/01/2020 1030 Last data filed at 06/30/2020 1500 Gross per 24 hour  Intake 375 ml  Output --  Net 375 ml   There were no vitals filed for this visit. Weight change:  Body mass index is 30.79 kg/m.   Physical Exam: General exam: Not in physical distress, Skin: No rashes, lesions or ulcers. HEENT: Atraumatic, normocephalic, supple neck, no obvious bleeding Lungs: Clear to auscultation bilaterally CVS: Regular rate and rhythm, no murmur  GI/Abd soft, nontender, nondistended, bowel sound present CNS: Tries to open eyes on verbal command.  Mumbles.  Not restless or agitated. Psychiatry: Depressed look Extremities: No pedal edema, no calf tenderness.  Right elbow tender on any passive movements.  Data Review: I have personally reviewed the laboratory data and studies available.  Recent Labs  Lab 06/27/20 0440 06/28/20 0330 06/29/20 0338 06/30/20 0305 07/01/20 0215  WBC 7.6 8.3 8.8 6.7 9.6  NEUTROABS 5.5 5.2 4.9 3.9 5.9  HGB 10.1* 12.2 12.0 11.1* 11.5*  HCT 32.5* 39.0 38.5 35.0* 36.2  MCV 94.5 94.2 93.9 92.8 93.3  PLT 192 152 171 196 221   Recent Labs  Lab 06/27/20 0440 06/28/20 0330  06/29/20 0338 06/30/20 0305 07/01/20 0215  NA 148* 149* 145 143 143  K 3.4* 3.5 4.2 3.2* 3.5  CL 108 113* 106 105 104  CO2 24 23 26 28 28   GLUCOSE 77 119* 124* 109* 103*  BUN 20 16 16 15 15   CREATININE 1.01* 0.86 0.82 0.74 0.76  CALCIUM 9.5 9.3 9.0 8.6* 8.8*  MG 1.9 2.1 1.9 1.9 1.8    F/u labs ordered.  Signed, Terrilee Croak, MD [facility] 07/01/2020

## 2020-07-01 NOTE — Plan of Care (Signed)
Pt is unable to progress at this time due to altered mental status.  Problem: Education: Goal: Knowledge of disease or condition will improve Outcome: Not Progressing Goal: Knowledge of secondary prevention will improve Outcome: Not Progressing Goal: Knowledge of patient specific risk factors addressed and post discharge goals established will improve Outcome: Not Progressing   Problem: Intracerebral Hemorrhage Tissue Perfusion: Goal: Complications of Intracerebral Hemorrhage will be minimized Outcome: Not Progressing   Problem: Education: Goal: Knowledge of General Education information will improve Description: Including pain rating scale, medication(s)/side effects and non-pharmacologic comfort measures Outcome: Not Progressing   Problem: Health Behavior/Discharge Planning: Goal: Ability to manage health-related needs will improve Outcome: Not Progressing   Problem: Clinical Measurements: Goal: Ability to maintain clinical measurements within normal limits will improve Outcome: Not Progressing Goal: Will remain free from infection Outcome: Not Progressing Goal: Diagnostic test results will improve Outcome: Not Progressing Goal: Respiratory complications will improve Outcome: Not Progressing Goal: Cardiovascular complication will be avoided Outcome: Not Progressing   Problem: Activity: Goal: Risk for activity intolerance will decrease Outcome: Not Progressing   Problem: Nutrition: Goal: Adequate nutrition will be maintained Outcome: Not Progressing   Problem: Coping: Goal: Level of anxiety will decrease Outcome: Not Progressing   Problem: Pain Managment: Goal: General experience of comfort will improve Outcome: Not Progressing   Problem: Safety: Goal: Ability to remain free from injury will improve Outcome: Not Progressing   Problem: Skin Integrity: Goal: Risk for impaired skin integrity will decrease Outcome: Not Progressing

## 2020-07-01 NOTE — Evaluation (Signed)
Speech Language Pathology Evaluation Patient Details Name: [name] [name] MRN: [id number] DOB: 05-Jun-1941 Today's Date: 07/01/2020 Time: 4098-1191 SLP Time Calculation (min) (ACUTE ONLY): 13 min  Problem List:  Patient Active Problem List   Diagnosis Date Noted  . Palliative care by specialist   . Acute metabolic encephalopathy 47/82/9562  . Seizure disorder ([place]) 06/26/2020  . Hypothyroidism 06/26/2020  . Cerebral hemorrhage(nontraumatic) ([place]) 06/25/2020  . Cerebral amyloid angiopathy ([place]) 06/25/2020  . AMS (altered mental status) 06/15/2020  . UTI (urinary tract infection) 06/15/2020  . Essential hypertension 06/15/2020  . Dizziness 12/07/2018  . Educated about COVID-19 virus infection 12/07/2018  . Dermatofibroma 12/23/2017  . Osteoarthritis of right hip 10/02/2017  . Multinodular goiter 06/15/2015  . Post-surgical hypothyroidism 06/15/2015  . Leg pain, lateral 08/30/2014  . Hyperglycemia 11/05/2013  . Family history of Alzheimer's disease 11/05/2013  . Memory problem 11/05/2013  . Medication side effect - Propofol (burning vein) and prvastatin (myalgia) 04/08/2013  . Lesion of spleen 04/08/2013  . Arthritis of shoulder 11/03/2012  . History of back surgery 11/03/2012  . HSV-2 seropositive 02/17/2012  . DNR (do not resuscitate) discussion 10/16/2011  . OSA (obstructive sleep apnea) 10/16/2011  . History of positive PPD 10/16/2011  . History of colonic polyps 07/18/2011  . Dyspnea 10/18/2010  . Heart burn 10/18/2010  . Chest pain 10/18/2010  . ADJUSTMENT DISORDER WITH ANXIETY 07/24/2010  . CHEST WALL PAIN, ANTERIOR 07/24/2010  . KNEE PAIN 04/04/2010  . Disturbance in sleep behavior 11/21/2009  . LEG PAIN 02/28/2009  . TINNITUS 05/11/2008  . HEADACHE 02/07/2008  . UNSPECIFIED VITAMIN D DEFICIENCY 02/05/2008  . OBESITY, MILD 10/13/2007  . ANEMIA 10/13/2007  . GASTRITIS, CHRONIC 10/13/2007  . DUODENITIS, WITHOUT HEMORRHAGE 10/13/2007  . HIATAL HERNIA  10/13/2007  . IRRITABLE BOWEL SYNDROME 10/13/2007  . GRANULOMA 10/13/2007  . RHINITIS 09/23/2007  . FASTING HYPERGLYCEMIA 09/04/2007  . HYPERLIPIDEMIA 04/07/2007  . HYPERTENSION 04/07/2007  . GERD 04/07/2007   Past Medical History:  Past Medical History:  Diagnosis Date  . Adenomatous colon polyp   . Anxiety   . Cataract 2013   bilat removed   . DJD (degenerative joint disease) of lumbar spine   . Estrogen deficiency   . Gastritis   . GERD (gastroesophageal reflux disease)   . Headache(784.0)   . Helicobacter pylori infection 10/13/2007   Qualifier: Diagnosis of  By: Marland Mcalpine    . History of positive PPD 10/16/2011  . HSV-2 infection   . Hyperglycemia   . Hyperlipidemia    no meds now  . Hypertension   . IBS (irritable bowel syndrome)   . Medication side effect - Propofol (burning vein) and prvastatin (myalgia) 04/08/2013   Pravastatin patient went off and rechallenged and symptoms recurred muscle cramps /foot cramps   . Neuromuscular disorder (HCC)    muscle cramps  . Pulsatile tinnitus    had MRA and MPV nl mild carotid dopplers2010  . Shingles 02/09/2011   Right upper face and eyelid but not involving the eye at this point.   . Sleep apnea    does not use c pap  . Thyroid disease   . Vulvitis    Past Surgical History:  Past Surgical History:  Procedure Laterality Date  . ABDOMINAL HYSTERECTOMY  1982   fibroids  . COLONOSCOPY  2003- 2102   adenomatous polyps  . FINGER SURGERY     left middle A1pulley release  . isthmuectomy     thyroid surgery  . LUMBAR  SPINE SURGERY  05/1989   spine d/t tumor neurofibroma Quentin Cornwall  . POLYPECTOMY    . SHOULDER ARTHROSCOPY Bilateral    left, right 2017  . THYROIDECTOMY     partial rt  ballen  . TONSILLECTOMY     at age 43.   Marland Kitchen TOTAL HIP ARTHROPLASTY Right 10/02/2017   Procedure: RIGHT TOTAL HIP ARTHROPLASTY ANTERIOR APPROACH;  Surgeon: Rod Can, MD;  Location: [facility]S;  Service: Orthopedics;  Laterality:  Right;  Needs RNFA  . TUBAL LIGATION  1974   HPI:  79 y.o. female who was brought to the ED on 06/25/2020 for altered mental status. CT head on 06/28/20 indicated Continued interval evolution of early subacute right occipital hemorrhage, slightly increased in size from previous now measuring up to 3 cm in greatest transaxial dimension, previously 2.5 cm. Associated surrounding vasogenic edema has also mildly increased, but there remains no significant regional mass effect.  Assessment / Plan / Recommendation Clinical Impression  Pt administered a partial assessment re: language, speech and cognition.  She was oriented to self only and followed 1-step commands inconsistently (<50%).  Pt able to answer personal information questions with simple answers such as names of family members or name of church she attends when asked during assessment with mod-max verbal/tactile cues, but alertness level affected accuracy obtained with these tasks as she only achieved 30% overall with mod verbal cues provided.  Speech intelligiblity within phrases-simple sentences was reduced to 50-75% accuracy due to alertness level and prior aphasia/low intensity of vocal quality.  Pt stated "I can't think of one" when asked to participate in word fluency tasks or repeat numbers or words.  On-going cognitive assessment recommended as pt's alertness level improves.  ST will continue to f/u while in acute setting.  Thank you for this consult.    SLP Assessment  SLP Recommendation/Assessment: Patient needs continued Speech Language Pathology Services SLP Visit Diagnosis: Aphasia (R47.01);Attention and concentration deficit;Cognitive communication deficit (R41.841) Attention and concentration deficit following: Cerebral infarction    Follow Up Recommendations  24 hour supervision/assistance;Other (comment) (TBD)    Frequency and Duration min 2x/week  1 week      SLP Evaluation Cognition  Overall Cognitive Status:  Impaired/Different from baseline Arousal/Alertness: Lethargic Orientation Level: Oriented to person;Disoriented to place;Disoriented to time;Disoriented to situation Attention: Sustained Sustained Attention: Impaired Sustained Attention Impairment: Verbal basic;Functional basic Memory: Impaired Memory Impairment: Other (comment) (Pt stated "I can't remember those words") Awareness: Impaired Awareness Impairment: Emergent impairment Problem Solving: Impaired Problem Solving Impairment: Verbal basic;Functional basic Behaviors: Perseveration Safety/Judgment: Impaired Comments:  (Pt intermittently lethargic during assessment)       Comprehension  Auditory Comprehension Overall Auditory Comprehension: Other (comment) (refused some tasks) Yes/No Questions: Impaired Basic Biographical Questions: 26-50% accurate Basic Immediate Environment Questions: 25-49% accurate Commands: Impaired One Step Basic Commands: 25-49% accurate Conversation: Simple Interfering Components: Attention;Processing speed;Working memory EffectiveTechniques: Repetition;Slowed speech Visual Recognition/Discrimination Discrimination: Not tested Reading Comprehension Reading Status: Not tested    Expression Expression Primary Mode of Expression: Verbal Verbal Expression Overall Verbal Expression: Impaired Level of Generative/Spontaneous Verbalization: Sentence Repetition: Impaired Level of Impairment: Word level Naming: Impairment Responsive: 26-50% accurate Confrontation: Impaired Convergent: 0-24% accurate Divergent: 0-24% accurate Other Naming Comments: perseveration Verbal Errors: Perseveration Pragmatics: Unable to assess Interfering Components: Premorbid deficit Non-Verbal Means of Communication: Not applicable Written Expression Dominant Hand: Right Written Expression: Not tested   Oral / Motor  Oral Motor/Sensory Function Overall Oral Motor/Sensory Function: Generalized oral weakness Motor  Speech Overall Motor Speech: Other (  comment) (DTA) Respiration: Within functional limits Phonation: Low vocal intensity Resonance: Within functional limits Articulation: Within functional limitis Intelligibility: Intelligibility reduced Word: 50-74% accurate Phrase: 50-74% accurate Sentence: 50-74% accurate Conversation: 25-49% accurate Motor Planning: Witnin functional limits Motor Speech Errors: Not applicable                       Elvina Sidle, M.S., CCC-SLP 07/01/2020, 1:50 PM

## 2020-07-02 DIAGNOSIS — R1319 Other dysphagia: Secondary | ICD-10-CM | POA: Diagnosis not present

## 2020-07-02 DIAGNOSIS — R627 Adult failure to thrive: Secondary | ICD-10-CM

## 2020-07-02 DIAGNOSIS — Z66 Do not resuscitate: Secondary | ICD-10-CM | POA: Diagnosis not present

## 2020-07-02 DIAGNOSIS — R52 Pain, unspecified: Secondary | ICD-10-CM

## 2020-07-02 DIAGNOSIS — I619 Nontraumatic intracerebral hemorrhage, unspecified: Secondary | ICD-10-CM | POA: Diagnosis not present

## 2020-07-02 MED ORDER — MORPHINE SULFATE (CONCENTRATE) 10 MG/0.5ML PO SOLN: 5.0000 mg | ORAL | Status: DC | PRN

## 2020-07-02 NOTE — Progress Notes (Signed)
PROGRESS NOTE  KAELYNNE [name]  DOB: 1941/05/04  PCP: Burnis Medin, MD OAC:[id number]  DOA: 06/25/2020  LOS: 7 days   Chief complaint: Altered mental status  Brief narrative: [name] [name] is a 79 y.o. female who was brought to the ED on 06/25/2020 for altered mental status. PMH significant for amyloid angiopathy, recent hemorrhagic stroke10/15/2021 at [facility] -residual cognitive deficits, poststroke aphasia, hypertension,anxiety/depression,hypothyroidism. Patient was recently hospitalized 11/4-11/6 here at [facility] for encephalopathy.   She is cared for by her son at home.  She was noted to be typically confused on a normal day and "lives in the past" however was noted to become more agitated than usual, therefore was brought to the ER.  In the ER she underwent CT head which showed a 2.5 cm new intracranial hemorrhage.Patient was discussed with neurosurgery who recommended admission by hospital service for observation and consultation with neurology. Patient has remained delirious for most of her hospitalization, she has required intermittent doses of IV Haldol and IV Ativan.  Palliative care consultation was obtained. Patient was made DNR/DNI.   Subjective: Patient was seen and examined this morning. Somnolent.  Mumbling Grimaces on moving right elbow. Had a temperature of 100.9 last night.  Assessment/Plan: Nontraumatic intracerebral hemorrhage History of cerebral amyloid angiopathy -05/26/2020, patient had hemorrhagic stroke with residual cognitive deficits and post stroke epilepsy. -11/4, patient was hospitalized for worsening mental status.  CT scan of head showed improving hemorrhage.   -11/14, readmitted for worsening confusion, hallucination, delusion.  CT scan of head showed a new intracranial hemorrhage measuring 2.5 cm in diameter. -11/15, repeat CT head showed stable findings. -No surgical intervention indicated per neurosurgery -Neurology  consultation appreciated.  Patient is not on any antithrombotics. -For cerebral amyloid angiopathy, patient follows up with Dr. Charlene Brooke at [facility] neurology.  Acute metabolic encephalopathy -Multifactorial in setting of underlying dementia, intracranial hemorrhage, hypernatremia, Depakote, Seroquel. -EEG on 11/5 showed potential etiopathogenicity but no seizure.  Given seizure potential, patient was started on Depakote 500 mg 3 times daily.   -11/18, with worsening sedation, I reduced the frequency to 500 mg twice daily.  Mental status gradually improving after that. -QTC 404 ms on EKG from 11/15. -Continue Seroquel.   Low-grade temperature -Intermittent low-grade temperature with normal WBC count.   -Likely due to blood clot.  Not on antibiotics.  Continue to monitor  Right elbow tenderness -Patient grimaces on movement of the right elbow.  The severity of pain seems to be inconsistent on multiple occasions.  No local signs of inflammation, swelling  Acute hypernatremia -Sodium level peaked at 149, improved with dextrose drip. Recent Labs  Lab 06/26/20 0455 06/27/20 0440 06/28/20 0330 06/29/20 0338 06/30/20 0305 07/01/20 0215  NA 144 148* 149* 145 143 143   Hypothyroidism -Continue Synthroid  Essential hypertension -Continue amlodipine  Goals of care -Palliative care consult appreciated.  DNR/DNI.  -Family members plan to continue conversations with his other make a decision regarding future treatment plan.  Palliative care to meet with family again this weekend.  Mobility: Physically strong. Code Status:   Code Status: DNR  Nutritional status: Body mass index is 30.79 kg/m.     Diet Order            Diet Heart Room service appropriate? Yes; Fluid consistency: Thin  Diet effective now                 DVT prophylaxis: Place and maintain sequential compression device Start: 06/26/20 1812 Place TED hose  Start: 06/25/20 1010   Antimicrobials: none Fluid:  None Consultants: neurology Family Communication:  Not at bedside   Status is: Inpatient  Remains inpatient appropriate because:Altered mental status   Dispo: The patient is from: Home              Anticipated d/c is to: Unable to discharge to home.  Likely SNF with hospice care              Anticipated d/c date is: Unclear at this time              Patient currently is not medically stable to d/c.  Infusions:    Scheduled Meds: . amLODipine  10 mg Oral Daily  . divalproex  500 mg Oral Q12H  . levothyroxine  100 mcg Oral Q0600  . melatonin  6 mg Oral QHS  . QUEtiapine  12.5 mg Oral BID    Antimicrobials: Anti-infectives (From admission, onward)   None      PRN meds: acetaminophen, labetalol, polyethylene glycol   Objective: Vitals:   07/02/20 0440 07/02/20 0736  BP: (!) 146/65 (!) 131/91  Pulse: 93 97  Resp:  18  Temp: (!) 100.9 F (38.3 C) 100.2 F (37.9 C)  SpO2: 95% 97%    Intake/Output Summary (Last 24 hours) at 07/02/2020 1104 Last data filed at 07/02/2020 0900 Gross per 24 hour  Intake 120 ml  Output 750 ml  Net -630 ml   There were no vitals filed for this visit. Weight change:  Body mass index is 30.79 kg/m.   Physical Exam: General exam: Not in physical distress, Skin: No rashes, lesions or ulcers. HEENT: Atraumatic, normocephalic, supple neck, no obvious bleeding Lungs: Clear to auscultation bilaterally CVS: Regular rate and rhythm, no murmur  GI/Abd soft, nontender, nondistended, bowel sound present CNS: Tries to open eyes and follow command.  Not restless or agitated. Psychiatry: Depressed look Extremities: No pedal edema, no calf tenderness.  Right elbow tenderness present on any passive movement.  Data Review: I have personally reviewed the laboratory data and studies available.  Recent Labs  Lab 06/27/20 0440 06/28/20 0330 06/29/20 0338 06/30/20 0305 07/01/20 0215  WBC 7.6 8.3 8.8 6.7 9.6  NEUTROABS 5.5 5.2 4.9 3.9 5.9   HGB 10.1* 12.2 12.0 11.1* 11.5*  HCT 32.5* 39.0 38.5 35.0* 36.2  MCV 94.5 94.2 93.9 92.8 93.3  PLT 192 152 171 196 221   Recent Labs  Lab 06/27/20 0440 06/28/20 0330 06/29/20 0338 06/30/20 0305 07/01/20 0215  NA 148* 149* 145 143 143  K 3.4* 3.5 4.2 3.2* 3.5  CL 108 113* 106 105 104  CO2 24 23 26 28 28   GLUCOSE 77 119* 124* 109* 103*  BUN 20 16 16 15 15   CREATININE 1.01* 0.86 0.82 0.74 0.76  CALCIUM 9.5 9.3 9.0 8.6* 8.8*  MG 1.9 2.1 1.9 1.9 1.8    F/u labs ordered.  Signed, Terrilee Croak, MD [facility] 07/02/2020

## 2020-07-02 NOTE — Progress Notes (Signed)
Patient ID: [name] [name], female   DOB: Apr 17, 1941, 79 y.o.   MRN: [id number]  This NP reviewed medical records, received report from team and then visited patient at the bedside as a follow up  for palliative medicine needs and emotional support and to meet with patient's two sons, as scheduled, for continued conversation regarding current medical situation; diagnosis, prognosis, goals of care, advanced directive decisions, treatment options decisions and anticipatory care needs.  A detailed discussion was had today regarding patient's current medical situation.  The difference between an aggressive medical intervention path and a palliative comfort path for this patient at this time in this situation was detailed.  Questions and concerns were addressed  Both sons verbalized an understanding of the seriousness of the patient's current medical situation and the likely long-term poor prognosis.  Both are able to verbalize their main goal for the patient at this time is comfort and dignity.  Plan of care: -DNR/DNI -no artifical feeding or hydration now or in the future -minimize medications/symptom management  -avoid rehospitalization -residential hospice for EOL care/prognosis is less than two weeks --referral for [facility] regarding hospice benefit was offered.  A MOST form was completed to reflect full comfort.  Questions and concerns addressed   Discussed with Dr Pietro Cassis and bedside RN  Total time spent on the unit was 60 minutes  Greater than 50% of the time was spent in counseling and coordination of care  Wadie Lessen NP  Palliative Medicine Team Team Phone # [phone] Pager [phone]

## 2020-07-03 ENCOUNTER — Ambulatory Visit: Payer: Medicare Other | Admitting: Physical Therapy

## 2020-07-03 DIAGNOSIS — I619 Nontraumatic intracerebral hemorrhage, unspecified: Secondary | ICD-10-CM | POA: Diagnosis not present

## 2020-07-03 MED ORDER — MORPHINE SULFATE (CONCENTRATE) 10 MG/0.5ML PO SOLN: 5.0000 mg | ORAL | Status: AC | PRN

## 2020-07-03 MED ORDER — POLYETHYLENE GLYCOL 3350 17 G PO PACK: 17.0000 g | PACK | Freq: Every day | ORAL | 0 refills | Status: AC | PRN

## 2020-07-03 NOTE — Progress Notes (Signed)
  Speech Language Pathology Treatment: Cognitive-Linquistic  Patient Details Name: [name] [name] MRN: [id number] DOB: 1941-06-08 Today's Date: 07/03/2020 Time: 1001-1016 SLP Time Calculation (min) (ACUTE ONLY): 15 min  Assessment / Plan / Recommendation Clinical Impression  Pt seen for skilled treatment with improvement noted from prior session in regard to cognition and language.  Pt answering personal information questions with 30% accuracy; able to follow simple commands during functional tasks with max verbal cues and 30% accuracy obtained; oriented to self only with mod verbal cues required.  Speech intelligiblity is decreased as pt mumbling intermittently, but edentulous state and low intensity impact intelligibility as well.  Pt conversed with nonsensical language and min agitation d/t confusion.  She was redirected when agitation occurred with good success. ST will continue efforts while pt in acute setting.   HPI HPI: 79 y.o. female who was brought to the ED on 06/25/2020 for altered mental status.      SLP Plan  Continue with current plan of care       Recommendations   Continue POC                General recommendations: Other(comment) (TBD) Oral Care Recommendations: Oral care BID Follow up Recommendations: 24 hour supervision/assistance SLP Visit Diagnosis: Attention and concentration deficit;Cognitive communication deficit (R41.841) Attention and concentration deficit following: Cerebral infarction Plan: Continue with current plan of care                       [name] [name], M.S., CCC-SLP 07/03/2020, 10:29 AM

## 2020-07-03 NOTE — TOC Transition Note (Signed)
Transition of Care [facility]) - CM/SW Discharge Note   Patient Details  Name: [name] [name] MRN: [id number] Date of Birth: January 14, 1941  Transition of Care [facility] CM/SW Contact:  Pollie Friar, RN Phone Number: 07/03/2020, 3:01 PM   Clinical Narrative:    Pt is discharging to [facility] today. CM has spoken with Judeen Hammans with [facility] and they have paperwork signed and bed available. Pt to transport via [facility]. Report called and son updated. Will place d/c packet at the desk and update the bedside RN.  Number for report: [phone]  Final next level of care: [facility] Barriers to Discharge: No Barriers Identified   Patient Goals and CMS Choice   CMS Medicare.gov Compare Post Acute Care list provided to:: Patient Represenative (must comment) Choice offered to / list presented to : Adult Children  Discharge Placement                       Discharge Plan and Services                                     Social Determinants of Health (SDOH) Interventions     Readmission Risk Interventions No flowsheet data found.

## 2020-07-03 NOTE — Discharge Summary (Signed)
Physician Discharge Summary  [name] [name] QZE:[id number] DOB: [birth year] DOA: 06/25/2020  PCP: Burnis Medin, MD  Admit date: 06/25/2020 Discharge date: 07/03/2020  Admitted From: Home Discharge disposition: Residential hospice   Code Status: DNR  Diet Recommendation: luxury feeding allowed if able to eat  Discharge Diagnosis:   Principal Problem:   Cerebral hemorrhage(nontraumatic) ([place]) Active Problems:   DNR (do not resuscitate) discussion   OSA (obstructive sleep apnea)   Essential hypertension   Cerebral amyloid angiopathy ([place])   Acute metabolic encephalopathy   Seizure disorder ([place])   Hypothyroidism   Palliative care by specialist  History of Present Illness / Brief narrative:  [name] [name] is a 79 y.o. female who was brought to the ED on 06/25/2020 for altered mental status. PMH significant for amyloid angiopathy, recent hemorrhagic stroke10/15/2021 at [facility] -residual cognitive deficits, poststroke aphasia, hypertension,anxiety/depression,hypothyroidism. Patient was recently hospitalized 11/4-11/6 here at [facility] for encephalopathy.   She is cared for by her son at home. She was noted to be typically confused on a normal day and "lives in the past" however was noted to become more agitated than usual, therefore was brought to the ER.  In the ER she underwent CT head which showed a 2.5 cm new intracranial hemorrhage.Patient was discussed with neurosurgery who recommended admission by hospital service for observation and consultation with neurology. Patient has remained delirious for most of her hospitalization, she has required intermittent doses of IV Haldol and IV Ativan.  Palliative care consultation was obtained. Patient was made DNR/DNI.  Family made a decision to transfer patient to inpatient hospice.  Subjective:  Seen and examined this morning.  Elderly African-American female.-Awake.  Mumbling.  Unable to answer questions.   Grimaces on moving her right extremities.  No family at bedside  Hospital Course:  Nontraumatic intracerebral hemorrhage History of cerebral amyloid angiopathy -05/26/2020, patient had hemorrhagic stroke with residual cognitive deficits and post stroke epilepsy. -11/4, patient was hospitalized for worsening mental status.  CT scan of head showed improving hemorrhage.   -11/14, readmitted for worsening confusion, hallucination, delusion.  CT scan of head showed a new intracranial hemorrhage measuring 2.5 cm in diameter. -11/15, repeat CT head showed stable findings. -No surgical intervention indicated per neurosurgery -Neurology consultation appreciated.  Patient is not on any antithrombotics.  Acute metabolic encephalopathy -Multifactorial in setting of underlying dementia, intracranial hemorrhage, hypernatremia, Depakote, Seroquel. -EEG on 11/5 showed potential etiopathogenicity but no seizure.    She was started on Depakote, dose adjusted to avoid sedation.  Currently on 500 mg twice daily.  Mental status continues to fluctuate. -QTC 404 ms on EKG from 11/15. -Continue Seroquel.   Low-grade temperature -Intermittent low-grade temperature with normal WBC count.   -Likely due to blood clot.  Not on antibiotics.  Continue to monitor  Right elbow tenderness -Patient grimaces on movement of the right elbow.  The severity of pain seems to be inconsistent on multiple occasions.  No local signs of inflammation, swelling.  Acute hypernatremia -Sodium level peaked at 149, improved with dextrose drip.  Recent Labs  Lab 06/27/20 0440 06/28/20 0330 06/29/20 0338 06/30/20 0305 07/01/20 0215  NA 148* 149* 145 143 143   Hypothyroidism -Continue Synthroid  Essential hypertension -Continue amlodipine  Goals of care -Palliative care consult appreciated.  DNR/DNI.  No artificial feeding or hydration. -Focus on symptom management/comfort care -Family made a decision to transition her  to inpatient hospice.  Wound care: Incision (Closed) 10/02/17 Hip Right (Active)  Date  First Assessed/Time First Assessed: 10/02/17 1612   Location: Hip  Location Orientation: Right    Assessments 10/02/2017  4:45 PM 10/03/2017  9:40 AM  Dressing Type Silicone dressing Silicone dressing  Dressing Clean;Dry;Intact Clean;Dry;Intact  Site / Wound Assessment -- Dressing in place / Unable to assess  Drainage Amount None None  Treatment Ice applied --     No Linked orders to display    Discharge Exam:   Vitals:   07/02/20 1530 07/02/20 2040 07/03/20 0905 07/03/20 1312  BP: 127/62 129/74 135/70 135/75  Pulse: 93 99 91 100  Resp: 18 18 18 18   Temp: 99.2 F (37.3 C) 98.7 F (37.1 C) 100 F (37.8 C) 99.4 F (37.4 C)  TempSrc: Oral Oral Oral Oral  SpO2: 94% 92% 93% 94%  Height:        Body mass index is 30.79 kg/m.  General exam: Not in discomfort.half awake, mumbling Skin: No rashes, lesions or ulcers. HEENT: Atraumatic, normocephalic, no obvious bleeding Lungs: Clear to auscultation bilaterally CVS: Regular rate and rhythm, no murmur GI/Abd soft, nontender, nondistended, bowel sound present CNS: half awake, unable to follow commands Psychiatry: Depressed look Extremities: No pedal edema, no calf tenderness  Follow ups:   Discharge Instructions    Increase activity slowly   Complete by: As directed       Follow-up Information    Pieter Partridge, DO. Go on 07/05/2020.   Specialty: Neurology Contact information: [address] [place] [address] [phone]        Burnis Medin, MD Follow up.   Specialties: Internal Medicine, Pediatrics Contact information: [place] [address] [phone]        Minus Breeding, MD .   Specialty: Cardiology Contact information: [address] [place] [address] [phone]               Recommendations for Outpatient Follow-Up:   1. Per hospice  policy  Discharge Instructions:  Follow with Primary MD Panosh, Standley Brooking, MD in 7 days   Please note You were cared for by a hospitalist during your hospital stay. If you have any questions about your discharge medications or the care you received while you were in the hospital after you are discharged, you can call the unit and asked to speak with the hospitalist on call if the hospitalist that took care of you is not available. Once you are discharged, your primary care physician will handle any further medical issues. Please note that NO REFILLS for any discharge medications will be authorized once you are discharged, as it is imperative that you return to your primary care physician (or establish a relationship with a primary care physician if you do not have one) for your aftercare needs so that they can reassess your need for medications and monitor your lab values.    Allergies as of 07/03/2020      Reactions   Lisinopril    REACTION: COUGH   Lyrica [pregabalin] Other (See Comments)   Cns side efffects   Pravastatin Other (See Comments)   Cramps.   Simvastatin    Myalgia and stiffness   Codeine Phosphate    REACTION: itching   Oxycodone Hcl    REACTION: rash   Strawberry Extract       Medication List    STOP taking these medications   acetaminophen 500 MG tablet Commonly known as: TYLENOL   amLODipine 2.5 MG tablet Commonly known  as: NORVASC   cyanocobalamin 1000 MCG tablet   famotidine 20 MG tablet Commonly known as: PEPCID   fluticasone 50 MCG/ACT nasal spray Commonly known as: FLONASE   levothyroxine 100 MCG tablet Commonly known as: Synthroid   thiamine 100 MG tablet     TAKE these medications   divalproex 500 MG DR tablet Commonly known as: DEPAKOTE Take 1 tablet (500 mg total) by mouth every 12 (twelve) hours.   melatonin 3 MG Tabs tablet Take 6 mg by mouth at bedtime.   morphine CONCENTRATE 10 MG/0.5ML Soln concentrated solution Take 0.25 mLs (5  mg total) by mouth every 2 (two) hours as needed for moderate pain or shortness of breath.   polyethylene glycol 17 g packet Commonly known as: MIRALAX / GLYCOLAX Take 17 g by mouth daily as needed for mild constipation.   QUEtiapine 25 MG tablet Commonly known as: SEROQUEL Take 12.5 mg by mouth at bedtime. May take an additional 1/2 tablet 2 times daily as needed for agitation       Time coordinating discharge: 35 minutes  The results of significant diagnostics from this hospitalization (including imaging, microbiology, ancillary and laboratory) are listed below for reference.    Procedures and Diagnostic Studies:   CT Head Wo Contrast  Result Date: 06/25/2020 CLINICAL DATA:  Mental status changes of unknown etiology. Confusion. EXAM: CT HEAD WITHOUT CONTRAST TECHNIQUE: Contiguous axial images were obtained from the base of the skull through the vertex without intravenous contrast. COMPARISON:  06/15/2020 FINDINGS: Brain: New intraparenchymal hemorrhage or hemorrhagic infarction at the right parietooccipital junction measuring 2.5 cm in diameter. Resolving changes of a late subacute intraparenchymal hematoma at the left temporoparietal junction. No significant mass effect or midline shift. Chronic small-vessel ischemic changes elsewhere throughout the brain as outlined above. No hydrocephalus. No extra-axial collection. Vascular: There is atherosclerotic calcification of the major vessels at the base of the brain. Skull: Normal Sinuses/Orbits: Clear/normal Other: None IMPRESSION: 1. New intraparenchymal hemorrhage or hemorrhagic infarction at the right parietooccipital junction measuring 2.5 cm in diameter. Resolving changes of a late subacute intraparenchymal hematoma at the left temporoparietal junction. No significant mass effect or midline shift. Known diagnosis of amyloid angiopathy. 2. Chronic small-vessel ischemic changes elsewhere throughout the brain. Critical Value/emergent results  were called by telephone at the time of interpretation on 06/25/2020 at 7:38 am to provider [facility] , who verbally acknowledged these results. Electronically Signed   By: Nelson Chimes M.D.   On: 06/25/2020 07:39     Labs:   Basic Metabolic Panel: Recent Labs  Lab 06/27/20 0440 06/27/20 0440 06/28/20 0330 06/28/20 0330 06/29/20 0338 06/29/20 0338 06/30/20 0305 07/01/20 0215  NA 148*  --  149*  --  145  --  143 143  K 3.4*   < > 3.5   < > 4.2   < > 3.2* 3.5  CL 108  --  113*  --  106  --  105 104  CO2 24  --  23  --  26  --  28 28  GLUCOSE 77  --  119*  --  124*  --  109* 103*  BUN 20  --  16  --  16  --  15 15  CREATININE 1.01*  --  0.86  --  0.82  --  0.74 0.76  CALCIUM 9.5  --  9.3  --  9.0  --  8.6* 8.8*  MG 1.9  --  2.1  --  1.9  --  1.9 1.8   < > = values in this interval not displayed.   GFR CrCl cannot be calculated (Unknown ideal weight.). Liver Function Tests: No results for input(s): AST, ALT, ALKPHOS, BILITOT, PROT, ALBUMIN in the last 168 hours. No results for input(s): LIPASE, AMYLASE in the last 168 hours. No results for input(s): AMMONIA in the last 168 hours. Coagulation profile No results for input(s): INR, PROTIME in the last 168 hours.  CBC: Recent Labs  Lab 06/27/20 0440 06/28/20 0330 06/29/20 0338 06/30/20 0305 07/01/20 0215  WBC 7.6 8.3 8.8 6.7 9.6  NEUTROABS 5.5 5.2 4.9 3.9 5.9  HGB 10.1* 12.2 12.0 11.1* 11.5*  HCT 32.5* 39.0 38.5 35.0* 36.2  MCV 94.5 94.2 93.9 92.8 93.3  PLT 192 152 171 196 221   Cardiac Enzymes: No results for input(s): CKTOTAL, CKMB, CKMBINDEX, TROPONINI in the last 168 hours. BNP: Invalid input(s): POCBNP CBG: Recent Labs  Lab 06/30/20 1218  GLUCAP 113*   D-Dimer No results for input(s): DDIMER in the last 72 hours. Hgb A1c No results for input(s): HGBA1C in the last 72 hours. Lipid Profile No results for input(s): CHOL, HDL, LDLCALC, TRIG, CHOLHDL, LDLDIRECT in the last 72 hours. Thyroid function  studies No results for input(s): TSH, T4TOTAL, T3FREE, THYROIDAB in the last 72 hours.  Invalid input(s): FREET3 Anemia work up No results for input(s): VITAMINB12, FOLATE, FERRITIN, TIBC, IRON, RETICCTPCT in the last 72 hours. Microbiology Recent Results (from the past 240 hour(s))  Respiratory Panel by RT PCR (Flu A&B, Covid) - Nasopharyngeal Swab     Status: None   Collection Time: 06/25/20  9:07 AM   Specimen: Nasopharyngeal Swab  Result Value Ref Range Status   SARS Coronavirus 2 by RT PCR NEGATIVE NEGATIVE Final    Comment: (NOTE) SARS-CoV-2 target nucleic acids are NOT DETECTED.  The SARS-CoV-2 RNA is generally detectable in upper respiratoy specimens during the acute phase of infection. The lowest concentration of SARS-CoV-2 viral copies this assay can detect is 131 copies/mL. A negative result does not preclude SARS-Cov-2 infection and should not be used as the sole basis for treatment or other patient management decisions. A negative result may occur with  improper specimen collection/handling, submission of specimen other than nasopharyngeal swab, presence of viral mutation(s) within the areas targeted by this assay, and inadequate number of viral copies (<131 copies/mL). A negative result must be combined with clinical observations, patient history, and epidemiological information. The expected result is Negative.  Fact Sheet for Patients:  PinkCheek.be  Fact Sheet for Healthcare Providers:  GravelBags.it  This test is no t yet approved or cleared by the [place] FDA and  has been authorized for detection and/or diagnosis of SARS-CoV-2 by FDA under an Emergency Use Authorization (EUA). This EUA will remain  in effect (meaning this test can be used) for the duration of the COVID-19 declaration under Section 564(b)(1) of the Act, 21 U.S.C. section 360bbb-3(b)(1), unless the authorization is terminated  or revoked sooner.     Influenza A by PCR NEGATIVE NEGATIVE Final   Influenza B by PCR NEGATIVE NEGATIVE Final    Comment: (NOTE) The Xpert Xpress SARS-CoV-2/FLU/RSV assay is intended as an aid in  the diagnosis of influenza from Nasopharyngeal swab specimens and  should not be used as a sole basis for treatment. Nasal washings and  aspirates are unacceptable for Xpert Xpress SARS-CoV-2/FLU/RSV  testing.  Fact Sheet for Patients: PinkCheek.be  Fact Sheet for Healthcare Providers: GravelBags.it  This test is not yet  approved or cleared by the [place] and  has been authorized for detection and/or diagnosis of SARS-CoV-2 by  FDA under an Emergency Use Authorization (EUA). This EUA will remain  in effect (meaning this test can be used) for the duration of the  Covid-19 declaration under Section 564(b)(1) of the Act, 21  U.S.C. section 360bbb-3(b)(1), unless the authorization is  terminated or revoked. Performed at [facility], [place] [address] [place], [place] [address]      Signed: Terrilee Croak  [facility] 07/03/2020, 1:26 PM

## 2020-07-03 NOTE — Progress Notes (Addendum)
Manufacturing engineer [facility]) Hospital Liaison Note  Received request from [name] [name]. for family interest in [name] [name] with request for transfer today . Chart reviewed and spoke to patient son [name] [name] to confirm interest, explain services and offer bed.  Family agreeable to transfer today once registeration paperwork is completed (will update this note once done) and will need transport arranged for patient. CSW aware.    Please fax discharge summary to [phone]. RN please call report to [phone].   Thank you for giving [place] the opportunity to participate in this patient's care,   [name] [name], [name] Liaison  [place] are on [place]: [place] is ready to receive patient once TOC can set up transportation.  TOC aware.

## 2020-07-05 ENCOUNTER — Other Ambulatory Visit: Payer: Self-pay | Admitting: Internal Medicine

## 2020-07-05 ENCOUNTER — Ambulatory Visit: Payer: Medicare Other

## 2020-07-05 ENCOUNTER — Ambulatory Visit: Payer: Medicare Other | Admitting: Neurology

## 2020-07-11 ENCOUNTER — Ambulatory Visit: Payer: Medicare Other | Admitting: Physical Therapy

## 2020-07-11 ENCOUNTER — Encounter: Payer: Medicare Other | Admitting: Occupational Therapy

## 2020-07-11 NOTE — Telephone Encounter (Signed)
No tcm completed due to pt discharged to hospice.

## 2020-07-12 DEATH — deceased

## 2020-07-14 ENCOUNTER — Encounter: Payer: Medicare Other | Admitting: Occupational Therapy

## 2020-07-14 ENCOUNTER — Ambulatory Visit: Payer: Medicare Other

## 2020-07-18 ENCOUNTER — Encounter: Payer: Medicare Other | Admitting: Occupational Therapy

## 2020-07-18 ENCOUNTER — Ambulatory Visit: Payer: Medicare Other | Admitting: Physical Therapy

## 2020-07-21 ENCOUNTER — Ambulatory Visit: Payer: Medicare Other | Admitting: Physical Therapy

## 2020-07-21 ENCOUNTER — Encounter: Payer: Medicare Other | Admitting: Occupational Therapy

## 2020-07-25 ENCOUNTER — Encounter: Payer: Medicare Other | Admitting: Occupational Therapy

## 2020-07-25 ENCOUNTER — Ambulatory Visit: Payer: Medicare Other

## 2020-07-28 ENCOUNTER — Ambulatory Visit: Payer: Medicare Other

## 2020-07-28 ENCOUNTER — Encounter: Payer: Medicare Other | Admitting: Occupational Therapy

## 2020-08-01 ENCOUNTER — Encounter: Payer: Medicare Other | Admitting: Occupational Therapy

## 2020-08-01 ENCOUNTER — Ambulatory Visit: Payer: Medicare Other

## 2020-08-03 ENCOUNTER — Ambulatory Visit: Payer: Medicare Other

## 2020-08-03 ENCOUNTER — Encounter: Payer: Medicare Other | Admitting: Occupational Therapy

## 2020-08-08 ENCOUNTER — Encounter: Payer: Medicare Other | Admitting: Occupational Therapy

## 2020-08-08 ENCOUNTER — Ambulatory Visit: Payer: Medicare Other | Admitting: Physical Therapy

## 2020-08-10 ENCOUNTER — Ambulatory Visit: Payer: Medicare Other

## 2020-08-10 ENCOUNTER — Encounter: Payer: Medicare Other | Admitting: Occupational Therapy

## 2020-11-30 ENCOUNTER — Telehealth: Payer: Self-pay | Admitting: Pharmacist

## 2020-11-30 NOTE — Progress Notes (Signed)
    Chronic Care Management Pharmacy Assistant   Name: [name] [name]  MRN: [id number] DOB: 1940-12-24  Reason for Encounter:General Disease State Call   Conditions to be addressed/monitored: HTN, hyperglycemia, HLD, hypothyroidism, IBS, rhinitis, GERD   Recent office visits:  06/23/20 Burnis Medin, MD. For altered mental status. CHANGED Levothyroxine Sodium to 100 mcg 5 days per week. 06/14/20 Panosh, Standley Brooking, MD. For follow-up stroke. No medication changes.   Recent consult visits:  06/19/20 Lysbeth Penner, NP. For obstructive sleep apnea. No medication changes.   Hospital visits: 06/25/20 [facility]. Discharged 07/03/20. Terrilee Croak, MD.  For cerebral hemorrhage. STOPPED acetaminophen, amlodipine, cyanocobalamin, famotidine, fluticasone. Levothyroxine, thiamine. 06/15/20 [facility]. Discharged 06/17/20. Terrilee Croak, MD. For Altered mental status. STOPPED Hydrochlorothiazide and Triamterene-Hydrochlorothiazide.   Medications: Outpatient Encounter Medications as of 11/30/2020  Medication Sig  . divalproex (DEPAKOTE) 500 MG DR tablet Take 1 tablet (500 mg total) by mouth every 12 (twelve) hours.  . melatonin 3 MG TABS tablet Take 6 mg by mouth at bedtime.   . Morphine Sulfate (MORPHINE CONCENTRATE) 10 MG/0.5ML SOLN concentrated solution Take 0.25 mLs (5 mg total) by mouth every 2 (two) hours as needed for moderate pain or shortness of breath.  . polyethylene glycol (MIRALAX / GLYCOLAX) 17 g packet Take 17 g by mouth daily as needed for mild constipation.  . QUEtiapine (SEROQUEL) 25 MG tablet Take 12.5 mg by mouth at bedtime. May take an additional 1/2 tablet 2 times daily as needed for agitation   No facility-administered encounter medications on file as of 11/30/2020.    GEN CALL:  Spoke with the patients son and he informed me that she passed away on thanksgiving. I apologized for interrupting him and he was understanding.   35  min  Star Rating Drugs:None  [place], [place] Pharmacist Assistant [phone]

## 2021-02-07 ENCOUNTER — Encounter (HOSPITAL_COMMUNITY): Payer: Self-pay | Admitting: Internal Medicine
